# Patient Record
Sex: Female | Born: 1950 | Race: Black or African American | Hispanic: No | Smoking: Current every day smoker
Health system: Southern US, Community
[De-identification: ages and names within clinical notes are randomized; demographics above are authoritative.]

## PROBLEM LIST (undated history)

## (undated) DIAGNOSIS — E785 Hyperlipidemia, unspecified: Secondary | ICD-10-CM

## (undated) DIAGNOSIS — I2510 Atherosclerotic heart disease of native coronary artery without angina pectoris: Secondary | ICD-10-CM

## (undated) DIAGNOSIS — K760 Fatty (change of) liver, not elsewhere classified: Secondary | ICD-10-CM

## (undated) DIAGNOSIS — I10 Essential (primary) hypertension: Secondary | ICD-10-CM

## (undated) DIAGNOSIS — D126 Benign neoplasm of colon, unspecified: Secondary | ICD-10-CM

## (undated) DIAGNOSIS — I878 Other specified disorders of veins: Secondary | ICD-10-CM

## (undated) DIAGNOSIS — I739 Peripheral vascular disease, unspecified: Secondary | ICD-10-CM

## (undated) DIAGNOSIS — R809 Proteinuria, unspecified: Secondary | ICD-10-CM

## (undated) DIAGNOSIS — E669 Obesity, unspecified: Secondary | ICD-10-CM

## (undated) DIAGNOSIS — J45909 Unspecified asthma, uncomplicated: Secondary | ICD-10-CM

## (undated) DIAGNOSIS — E119 Type 2 diabetes mellitus without complications: Secondary | ICD-10-CM

## (undated) DIAGNOSIS — K219 Gastro-esophageal reflux disease without esophagitis: Secondary | ICD-10-CM

## (undated) DIAGNOSIS — R1310 Dysphagia, unspecified: Secondary | ICD-10-CM

## (undated) HISTORY — DX: Dysphagia, unspecified: R13.10

## (undated) HISTORY — DX: Hyperlipidemia, unspecified: E78.5

## (undated) HISTORY — DX: Proteinuria, unspecified: R80.9

## (undated) HISTORY — PX: CYSTECTOMY: SUR359

## (undated) HISTORY — DX: Type 2 diabetes mellitus without complications: E11.9

## (undated) HISTORY — DX: Essential (primary) hypertension: I10

## (undated) HISTORY — DX: Atherosclerotic heart disease of native coronary artery without angina pectoris: I25.10

## (undated) HISTORY — DX: Benign neoplasm of colon, unspecified: D12.6

## (undated) HISTORY — DX: Obesity, unspecified: E66.9

## (undated) HISTORY — PX: CORONARY STENT PLACEMENT: SHX1402

## (undated) HISTORY — DX: Gastro-esophageal reflux disease without esophagitis: K21.9

## (undated) HISTORY — DX: Peripheral vascular disease, unspecified: I73.9

## (undated) HISTORY — PX: TUBAL LIGATION: SHX77

## (undated) HISTORY — DX: Other specified disorders of veins: I87.8

## (undated) HISTORY — DX: Fatty (change of) liver, not elsewhere classified: K76.0

## (undated) HISTORY — PX: CARDIAC CATHETERIZATION: SHX172

## (undated) HISTORY — PX: PILONIDAL CYST / SINUS EXCISION: SUR543

---

## 1998-02-06 DIAGNOSIS — E119 Type 2 diabetes mellitus without complications: Secondary | ICD-10-CM

## 1998-02-06 HISTORY — DX: Type 2 diabetes mellitus without complications: E11.9

## 1999-09-22 ENCOUNTER — Other Ambulatory Visit: Admission: RE | Admit: 1999-09-22 | Discharge: 1999-09-22 | Payer: Self-pay | Admitting: Obstetrics

## 1999-09-22 ENCOUNTER — Encounter (INDEPENDENT_AMBULATORY_CARE_PROVIDER_SITE_OTHER): Payer: Self-pay

## 1999-09-27 ENCOUNTER — Ambulatory Visit (HOSPITAL_COMMUNITY): Admission: RE | Admit: 1999-09-27 | Discharge: 1999-09-27 | Payer: Self-pay | Admitting: Obstetrics

## 1999-09-27 ENCOUNTER — Encounter: Payer: Self-pay | Admitting: Obstetrics

## 2001-03-16 ENCOUNTER — Encounter: Payer: Self-pay | Admitting: Emergency Medicine

## 2001-03-16 ENCOUNTER — Emergency Department (HOSPITAL_COMMUNITY): Admission: EM | Admit: 2001-03-16 | Discharge: 2001-03-16 | Payer: Self-pay | Admitting: Emergency Medicine

## 2001-05-12 ENCOUNTER — Emergency Department (HOSPITAL_COMMUNITY): Admission: EM | Admit: 2001-05-12 | Discharge: 2001-05-12 | Payer: Self-pay | Admitting: *Deleted

## 2001-06-07 ENCOUNTER — Encounter: Payer: Self-pay | Admitting: Neurology

## 2001-06-07 ENCOUNTER — Ambulatory Visit (HOSPITAL_COMMUNITY): Admission: RE | Admit: 2001-06-07 | Discharge: 2001-06-07 | Payer: Self-pay | Admitting: Neurology

## 2001-11-20 ENCOUNTER — Ambulatory Visit (HOSPITAL_COMMUNITY): Admission: RE | Admit: 2001-11-20 | Discharge: 2001-11-20 | Payer: Self-pay | Admitting: Obstetrics

## 2001-11-20 ENCOUNTER — Encounter: Payer: Self-pay | Admitting: Obstetrics

## 2002-03-12 ENCOUNTER — Ambulatory Visit (HOSPITAL_COMMUNITY): Admission: RE | Admit: 2002-03-12 | Discharge: 2002-03-12 | Payer: Self-pay | Admitting: Obstetrics

## 2002-03-12 ENCOUNTER — Encounter: Payer: Self-pay | Admitting: Obstetrics

## 2003-03-30 ENCOUNTER — Ambulatory Visit (HOSPITAL_COMMUNITY): Admission: RE | Admit: 2003-03-30 | Discharge: 2003-03-30 | Payer: Self-pay | Admitting: Obstetrics

## 2004-03-29 ENCOUNTER — Ambulatory Visit (HOSPITAL_COMMUNITY): Admission: RE | Admit: 2004-03-29 | Discharge: 2004-03-29 | Payer: Self-pay | Admitting: Family Medicine

## 2005-08-29 ENCOUNTER — Emergency Department (HOSPITAL_COMMUNITY): Admission: EM | Admit: 2005-08-29 | Discharge: 2005-08-29 | Payer: Self-pay | Admitting: Emergency Medicine

## 2006-05-21 ENCOUNTER — Other Ambulatory Visit: Payer: Self-pay

## 2006-05-21 ENCOUNTER — Observation Stay: Payer: Self-pay | Admitting: Internal Medicine

## 2007-06-12 ENCOUNTER — Ambulatory Visit (HOSPITAL_COMMUNITY): Admission: RE | Admit: 2007-06-12 | Discharge: 2007-06-12 | Payer: Self-pay | Admitting: Family Medicine

## 2007-06-25 ENCOUNTER — Ambulatory Visit: Payer: Self-pay | Admitting: Internal Medicine

## 2007-07-08 ENCOUNTER — Ambulatory Visit: Payer: Self-pay | Admitting: Internal Medicine

## 2007-07-08 ENCOUNTER — Ambulatory Visit (HOSPITAL_COMMUNITY): Admission: RE | Admit: 2007-07-08 | Discharge: 2007-07-08 | Payer: Self-pay | Admitting: Internal Medicine

## 2007-07-08 ENCOUNTER — Encounter: Payer: Self-pay | Admitting: Internal Medicine

## 2007-07-08 DIAGNOSIS — D126 Benign neoplasm of colon, unspecified: Secondary | ICD-10-CM

## 2007-07-08 HISTORY — DX: Benign neoplasm of colon, unspecified: D12.6

## 2007-07-08 HISTORY — PX: COLONOSCOPY W/ POLYPECTOMY: SHX1380

## 2008-03-17 ENCOUNTER — Ambulatory Visit: Payer: Self-pay | Admitting: Cardiology

## 2008-03-18 ENCOUNTER — Other Ambulatory Visit: Payer: Self-pay | Admitting: Cardiology

## 2008-03-18 ENCOUNTER — Inpatient Hospital Stay (HOSPITAL_COMMUNITY): Admission: AD | Admit: 2008-03-18 | Discharge: 2008-03-19 | Payer: Self-pay | Admitting: Family Medicine

## 2008-03-18 ENCOUNTER — Ambulatory Visit: Payer: Self-pay | Admitting: Cardiovascular Disease

## 2008-03-19 ENCOUNTER — Other Ambulatory Visit: Payer: Self-pay | Admitting: Cardiology

## 2008-03-19 ENCOUNTER — Other Ambulatory Visit: Payer: Self-pay | Admitting: Cardiovascular Disease

## 2008-03-25 ENCOUNTER — Ambulatory Visit: Payer: Self-pay | Admitting: Cardiology

## 2008-03-30 ENCOUNTER — Encounter (HOSPITAL_COMMUNITY): Admission: RE | Admit: 2008-03-30 | Discharge: 2008-04-29 | Payer: Self-pay | Admitting: Cardiology

## 2008-04-07 ENCOUNTER — Ambulatory Visit: Payer: Self-pay | Admitting: Cardiology

## 2008-05-01 ENCOUNTER — Encounter (HOSPITAL_COMMUNITY): Admission: RE | Admit: 2008-05-01 | Discharge: 2008-05-31 | Payer: Self-pay | Admitting: Cardiology

## 2008-06-03 ENCOUNTER — Encounter (HOSPITAL_COMMUNITY): Admission: RE | Admit: 2008-06-03 | Discharge: 2008-07-03 | Payer: Self-pay | Admitting: Cardiology

## 2008-07-22 ENCOUNTER — Ambulatory Visit: Payer: Self-pay | Admitting: Physician Assistant

## 2008-07-26 ENCOUNTER — Emergency Department (HOSPITAL_COMMUNITY): Admission: EM | Admit: 2008-07-26 | Discharge: 2008-07-27 | Payer: Self-pay | Admitting: Emergency Medicine

## 2008-08-13 ENCOUNTER — Ambulatory Visit: Payer: Self-pay | Admitting: Internal Medicine

## 2008-08-13 DIAGNOSIS — R059 Cough, unspecified: Secondary | ICD-10-CM | POA: Insufficient documentation

## 2008-08-13 DIAGNOSIS — R05 Cough: Secondary | ICD-10-CM

## 2008-08-28 ENCOUNTER — Ambulatory Visit: Payer: Self-pay | Admitting: Internal Medicine

## 2008-09-24 ENCOUNTER — Ambulatory Visit: Payer: Self-pay | Admitting: Internal Medicine

## 2008-09-24 ENCOUNTER — Ambulatory Visit: Payer: Self-pay | Admitting: Cardiology

## 2008-10-04 ENCOUNTER — Emergency Department (HOSPITAL_COMMUNITY): Admission: EM | Admit: 2008-10-04 | Discharge: 2008-10-04 | Payer: Self-pay | Admitting: Emergency Medicine

## 2008-12-26 ENCOUNTER — Encounter (INDEPENDENT_AMBULATORY_CARE_PROVIDER_SITE_OTHER): Payer: Self-pay | Admitting: *Deleted

## 2008-12-26 LAB — CONVERTED CEMR LAB
BUN: 7 mg/dL
CO2: 24 meq/L
Calcium: 8.7 mg/dL
Chloride: 103 meq/L
Creatinine, Ser: 0.75 mg/dL
Glucose, Bld: 148 mg/dL
HDL: 24 mg/dL
Hgb A1c MFr Bld: 10.5 %

## 2009-03-04 ENCOUNTER — Encounter (INDEPENDENT_AMBULATORY_CARE_PROVIDER_SITE_OTHER): Payer: Self-pay | Admitting: *Deleted

## 2009-03-11 ENCOUNTER — Encounter (INDEPENDENT_AMBULATORY_CARE_PROVIDER_SITE_OTHER): Payer: Self-pay | Admitting: *Deleted

## 2009-03-11 ENCOUNTER — Ambulatory Visit: Payer: Self-pay | Admitting: Cardiology

## 2009-03-11 DIAGNOSIS — F17200 Nicotine dependence, unspecified, uncomplicated: Secondary | ICD-10-CM | POA: Insufficient documentation

## 2009-03-11 DIAGNOSIS — Z794 Long term (current) use of insulin: Secondary | ICD-10-CM

## 2009-03-11 DIAGNOSIS — K219 Gastro-esophageal reflux disease without esophagitis: Secondary | ICD-10-CM | POA: Insufficient documentation

## 2009-03-11 DIAGNOSIS — I2510 Atherosclerotic heart disease of native coronary artery without angina pectoris: Secondary | ICD-10-CM | POA: Insufficient documentation

## 2009-03-11 DIAGNOSIS — E782 Mixed hyperlipidemia: Secondary | ICD-10-CM | POA: Insufficient documentation

## 2009-03-11 DIAGNOSIS — E1142 Type 2 diabetes mellitus with diabetic polyneuropathy: Secondary | ICD-10-CM | POA: Insufficient documentation

## 2009-08-07 ENCOUNTER — Encounter (INDEPENDENT_AMBULATORY_CARE_PROVIDER_SITE_OTHER): Payer: Self-pay | Admitting: *Deleted

## 2009-08-07 LAB — CONVERTED CEMR LAB
Albumin: 4.2 g/dL
HDL: 23 mg/dL
Hgb A1c MFr Bld: 8.9 %
Total Protein: 7.6 g/dL
Triglycerides: 149 mg/dL

## 2009-10-12 ENCOUNTER — Ambulatory Visit: Payer: Self-pay | Admitting: Cardiovascular Disease

## 2009-10-12 ENCOUNTER — Observation Stay (HOSPITAL_COMMUNITY): Admission: EM | Admit: 2009-10-12 | Discharge: 2009-10-12 | Payer: Self-pay | Admitting: Emergency Medicine

## 2009-10-12 LAB — CONVERTED CEMR LAB
Albumin: 3.7 g/dL
Alkaline Phosphatase: 151 units/L
CO2: 21 meq/L
Chloride: 105 meq/L
HCT: 41.3 %
Platelets: 297 10*3/uL
Sodium: 136 meq/L
Total Protein: 7.7 g/dL
WBC: 8.4 10*3/uL

## 2009-10-29 ENCOUNTER — Encounter (INDEPENDENT_AMBULATORY_CARE_PROVIDER_SITE_OTHER): Payer: Self-pay | Admitting: *Deleted

## 2009-11-03 ENCOUNTER — Ambulatory Visit: Payer: Self-pay | Admitting: Cardiology

## 2009-11-03 ENCOUNTER — Encounter (INDEPENDENT_AMBULATORY_CARE_PROVIDER_SITE_OTHER): Payer: Self-pay | Admitting: *Deleted

## 2010-03-08 NOTE — Miscellaneous (Signed)
Summary: lipids,lft  Clinical Lists Changes  Observations: Added new observation of ALBUMIN: 4.2 g/dL (16/11/9602 54:09) Added new observation of PROTEIN, TOT: 7.6 g/dL (81/19/1478 29:56) Added new observation of SGPT (ALT): 14 units/L (08/07/2009 16:44) Added new observation of SGOT (AST): 17 units/L (08/07/2009 16:44) Added new observation of ALK PHOS: 145 units/L (08/07/2009 16:44) Added new observation of BILI DIRECT: <0.1 mg/dL (21/30/8657 84:69) Added new observation of LDL: 102 mg/dL (62/95/2841 32:44) Added new observation of HDL: 23 mg/dL (02/08/7251 66:44) Added new observation of TRIGLYC TOT: 149 mg/dL (03/47/4259 56:38) Added new observation of CHOLESTEROL: 155 mg/dL (75/64/3329 51:88) Added new observation of HGBA1C: 8.9 % (08/07/2009 16:44)

## 2010-03-08 NOTE — Miscellaneous (Signed)
Summary: cath 10/13/2009,cxr 10/12/2009  Clinical Lists Changes  Observations: Added new observation of CARDCATHFIND:  IMPRESSION:   1. Patent stents in the right coronary artery.   2. Moderate left main stenosis that is nonobstructive.   3. Diffuse nonobstructive disease in the circumflex and left anterior       descending arteries.   4. Normal left ventricular systolic function.      RECOMMENDATIONS:  I recommend continued medical management at this time.   The patient will be discharged to home tonight after her mandatory   bedrest.               Verne Carrow, MD     (10/13/2009 15:24) Added new observation of CXR RESULTS:  Clinical Data: Chest pain    PORTABLE CHEST - 1 VIEW    Comparison: 07/26/2008.    Findings: The heart is borderline in size.  There are no   infiltrates or edematous changes.  There are mild stable chronic   bronchitic changes.  There is no evidence for adenopathy.    IMPRESSION:   Borderline cardiac size - stable.  Stable chronic bronchitic   changes.  No acute findings.    Read By:  Stoney Bang,  M.D.   Released By:  Stoney Bang,  M.D.  Additional Information  HL7 RESULT STATUS : F  External image : [id number]  External IF Update Timestamp : 2009-10-12:09:46:11.000000  (10/12/2009 15:24)      CXR  Procedure date:  10/12/2009  Findings:       Clinical Data: Chest pain    PORTABLE CHEST - 1 VIEW    Comparison: 07/26/2008.    Findings: The heart is borderline in size.  There are no   infiltrates or edematous changes.  There are mild stable chronic   bronchitic changes.  There is no evidence for adenopathy.    IMPRESSION:   Borderline cardiac size - stable.  Stable chronic bronchitic   changes.  No acute findings.    Read By:  Stoney Bang,  M.D.   Released By:  Stoney Bang,  M.D.  Additional Information  HL7 RESULT STATUS : F  External image : [id number]  External IF  Update Timestamp : 2009-10-12:09:46:11.000000   Cardiac Cath  Procedure date:  10/13/2009  Findings:       IMPRESSION:   1. Patent stents in the right coronary artery.   2. Moderate left main stenosis that is nonobstructive.   3. Diffuse nonobstructive disease in the circumflex and left anterior       descending arteries.   4. Normal left ventricular systolic function.      RECOMMENDATIONS:  I recommend continued medical management at this time.   The patient will be discharged to home tonight after her mandatory   bedrest.               Verne Carrow, MD

## 2010-03-08 NOTE — Letter (Signed)
Summary: [facility], agricultural at [employer]  [address], [place] [address]   Phone: [phone]  Fax: [phone]     November 03, 2009 MRN: [id number]   Rachel [name] [address] [place]  [address]      YOUR LAB WORK IS DUE   February 02, 2010  Please go to [facility], located across the street from [facility] on the second floor.  Hours are Monday - Friday 7am until 7:30pm         Saturday 8am until 12noon    _X_  DO NOT EAT OR DRINK AFTER MIDNIGHT EVENING PRIOR TO LABWORK  __ YOUR LABWORK IS NOT FASTING --YOU MAY EAT PRIOR TO LABWORK

## 2010-03-08 NOTE — Assessment & Plan Note (Signed)
Summary: ROV  Medications Added LANTUS 100 UNIT/ML SOLN (INSULIN GLARGINE) 54 units at bedtime NIACIN CR 500 MG CR-TABS (NIACIN) Take 1 tablet by mouth once a day titrating to 1500mg  once daily AMLODIPINE BESYLATE 5 MG TABS (AMLODIPINE BESYLATE) Take one tablet by mouth daily      Allergies Added: NKDA  Visit Type:  Follow-up Referring Provider:  Dr. Lubertha South Primary Provider:  Dr. Lubertha South   History of Present Illness: Return visit for this very pleasant 60 year old woman with coronary artery disease and multiple cardiovascular risk factors.  Since her last visit, she has done generally well.  She has had no significant chest discomfort.  She does note dyspnea with moderate exertion.  She was unable to complete the cardiac rehabilitation program due to the need to return to her work place.  Current Medications (verified): 1)  Aspirin 81 Mg Tbec (Aspirin) .... Take One Tablet By Mouth Daily 2)  Metformin Hcl 1000 Mg Tabs (Metformin Hcl) .Marland Kitchen.. 1 Tab Two Times A Day 3)  Glyburide 5 Mg Tabs (Glyburide) .... 2 Tabs Two Times A Day 4)  Plavix 75 Mg Tabs (Clopidogrel Bisulfate) .Marland Kitchen.. 1 Tab Daily 5)  Oxybutynin Chloride 10 Mg Xr24h-Tab (Oxybutynin Chloride) .Marland Kitchen.. 1 Tab Two Times A Day 6)  Lipitor 80 Mg Tabs (Atorvastatin Calcium) .Marland Kitchen.. 1 Tab Daily 7)  Lantus 100 Unit/ml Soln (Insulin Glargine) .... 54 Units At Bedtime 8)  Alprazolam 0.5 Mg Tabs (Alprazolam) .Marland Kitchen.. 1 Tab At Bedtime As Needed 9)  Niacin Cr 500 Mg Cr-Tabs (Niacin) .... Take 1 Tablet By Mouth Once A Day Titrating To 1500mg  Once Daily 10)  Amlodipine Besylate 5 Mg Tabs (Amlodipine Besylate) .... Take One Tablet By Mouth Daily  Allergies (verified): No Known Drug Allergies  Past History:  PMH, FH, and Social History reviewed and updated.  Past Medical History: ASCVD-PCI with DES to the RCA in 03/2008; residual moderate left main, circumflex and first marginal disease;         normal EF Diabetes Type 2-onset in  2000 Hyperlipidemia Hypertension G E R D    h/o esophageal dilatation 6/09 Cough..................................................................Marland KitchenWert    - onset 6/09    - Sinus CT neg September 24, 2008   Social History: Separated with one adult daughter Clinical biochemist for [employer] Current smoker since age 8.  Smokes 1 ppd; 40-pack-year history  Review of Systems       The patient complains of weight loss, dyspnea on exertion, and peripheral edema.  The patient denies anorexia, vision loss, decreased hearing, hoarseness, chest pain, syncope, headaches, hemoptysis, abdominal pain, and melena.         She previously noted a prolonged cough, but this resolved after evaluation and treatment by Dr. Sherene Sires  Vital Signs:  Patient profile:   60 year old female Weight:      176 pounds Pulse rate:   95 / minute BP sitting:   155 / 86  (right arm)  Vitals Entered By: Dreama Saa, CNA (March 11, 2009 2:46 PM)  Physical Exam  General:   General-Well developed; no acute distress; overweight Neck-No JVD; no carotid bruits: Lungs-No tachypnea, no rales; no rhonchi; no wheezes: Cardiovascular-normal PMI; normal S1 and S2: Abdomen-BS normal; soft and non-tender without masses or organomegaly:  Musculoskeletal-No deformities, no cyanosis or clubbing: Neurologic-Normal cranial nerves; symmetric strength and tone:  Skin-Warm, no significant lesions: Extremities-Nl distal pulses; no edema:     Impression & Recommendations:  Problem # 1:  ATHEROSCLEROTIC CARDIOVASCULAR  DISEASE (ICD-429.2) No clinical evidence for recurrent myocardial ischemia.  Treatment will continue to concentrate on optimal control of cardiovascular risk factors.  Problem # 2:  HYPERTENSION (ICD-401.1) Patient reports systolic blood pressure in the 140s at home.  This remains too high for patient with diabetes and known vascular disease.  Amlodipine 5 mg q.d. will be added to her medical regime.  She will  follow blood pressures at home, and we will continue to follow them in the office.  Problem # 3:  HYPERLIPIDEMIA (ICD-272.4) Hyperlipidemia is relatively well controlled on high-dose atorvastatin, but LDL remains higher than the optimal level, and HDL is low.  Niacin will be added an initial dose of 500 mg q.d. and titrated to 1500 mg q.d. at which time a fasting lipid profile will be repeated.  Problem # 4:  DIABETES MELLITUS, TYPE II (ICD-250.00) Recent hemoglobin A1c level was reportedly 9.  Patient will continue work closely with Dr. Gerda Diss to achieve better control.  Problem # 5:  COUGH (ICD-786.2) Patient reports that Dr. Sherene Sires stopped all of her antihypertensive medications in an attempt to control her cough.  It does not appear that she was taking ACE inhibitor at that time, but was treated with an ARB.  I will try to avoid medications from both classes.  I will plan to see this nice woman again in 6 months.  Other Orders: Future Orders: T-Lipid Profile ([id number]) ... 05/10/2009 T-Comprehensive Metabolic Panel [phone]) ... 05/10/2009  Patient Instructions: 1)  Your physician recommends that you schedule a follow-up appointment in: 6 months 2)  Your physician recommends that you return for lab work in: 2 months 3)  Your physician has recommended you make the following change in your medication: amlodipine 5mg  daily 4)  niacin 500mg  daily titrating to 1500mg  daily Prescriptions: AMLODIPINE BESYLATE 5 MG TABS (AMLODIPINE BESYLATE) Take one tablet by mouth daily  #30 x 6   Entered by:   Teressa Lower RN   Authorized by:   Kathlen Brunswick, MD, [facility]   Signed by:   Teressa Lower RN on 03/11/2009   Method used:   Electronically to        [phone][facility] (retail)       [address]       [place], [place]  [address]       Ph: [phone] or [phone]       Fax: [phone]   RxID:   [id number]

## 2010-03-08 NOTE — Miscellaneous (Signed)
Summary: labs bmp,liver,lipid ,a1c, 12/26/2008  Clinical Lists Changes  Observations: Added new observation of CALCIUM: 8.7 mg/dL (34/74/2595 63:87) Added new observation of ALBUMIN: 4.1 g/dL (56/43/3295 18:84) Added new observation of PROTEIN, TOT: 7.7 g/dL (16/60/6301 60:10) Added new observation of SGPT (ALT): 12 units/L (12/26/2008 15:41) Added new observation of SGOT (AST): 16 units/L (12/26/2008 15:41) Added new observation of ALK PHOS: 140 units/L (12/26/2008 15:41) Added new observation of BILI DIRECT: 0.1 mg/dL (93/23/5573 22:02) Added new observation of CREATININE: 0.75 mg/dL (54/27/0623 76:28) Added new observation of BUN: 7 mg/dL (31/51/7616 07:37) Added new observation of BG RANDOM: 148 mg/dL (10/62/6948 54:62) Added new observation of CO2 PLSM/SER: 24 meq/L (12/26/2008 15:41) Added new observation of CL SERUM: 103 meq/L (12/26/2008 15:41) Added new observation of K SERUM: 4.7 meq/L (12/26/2008 15:41) Added new observation of NA: 139 meq/L (12/26/2008 15:41) Added new observation of LDL: 101 mg/dL (70/35/0093 81:82) Added new observation of HDL: 24 mg/dL (99/37/1696 78:93) Added new observation of TRIGLYC TOT: 141 mg/dL (81/02/7508 25:85) Added new observation of CHOLESTEROL: 153 mg/dL (27/78/2423 53:61) Added new observation of HGBA1C: 10.5 % (12/26/2008 15:41)

## 2010-03-08 NOTE — Letter (Signed)
Summary: [facility], agricultural at [employer]  [address] [place] [address]   Phone: [phone]  Fax: [phone]     March 11, 2009 MRN: [id number]   Rachel [name] [address], [place]  [address]      YOUR LAB WORK IS DUE   _____________APRIL 4, 2011___________________________  Please go to [facility], located across the street from [facility] [place] [facility] on the second floor.  Hours are Monday - Friday 7am until 7:30pm         Saturday 8am until 12noon    _X_  DO NOT EAT OR DRINK AFTER MIDNIGHT EVENING PRIOR TO LABWORK  __ YOUR LABWORK IS NOT FASTING --YOU MAY EAT PRIOR TO LABWORK

## 2010-03-08 NOTE — Assessment & Plan Note (Signed)
Summary: PER PT REQUEST DUE FOR F/U/TG  Medications Added LANTUS 100 UNIT/ML SOLN (INSULIN GLARGINE) 74 units at bedtime NIACIN CR 500 MG CR-TABS (NIACIN) Take 2 tabs at bedtime AMLODIPINE BESYLATE 5 MG TABS (AMLODIPINE BESYLATE) Take one tablet by mouth daily NITROSTAT 0.4 MG SUBL (NITROGLYCERIN) 1 tablet under tongue at onset of chest pain; you may repeat every 5 minutes for up to 3 doses.      Allergies Added: NKDA  Visit Type:  Follow-up Referring Provider:  . Primary Provider:  Dr. Lubertha South   History of Present Illness: Rachel [name] returns to the office for continued assessment and treatment of coronary disease and cardiovascular risk factors.  She was recently admitted to hospital with chest discomfort and underwent cardiac catheterization revealing patent stent sites and no significant new atherosclerotic disease.  Blood pressure control was improved with amlodipine prescribed for her at her last visit, but she became concerned about systolic blood pressures of less than 120, contacted Dr. Gerda Diss and was apparently told to discontinue that medication.  Since then, blood pressures systolics have been in the 130-145 range.  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  Lifestyle is sedentary.  She is doing a program at work that will promote exercise, a healthy diet and weight reduction.  She wonders whether she would be a candidate for bariatric surgery.  Current Medications (verified): 1)  Aspirin 81 Mg Tbec (Aspirin) .... Take One Tablet By Mouth Daily 2)  Metformin Hcl 1000 Mg Tabs (Metformin Hcl) .Marland Kitchen.. 1 Tab Two Times A Day 3)  Glyburide 5 Mg Tabs (Glyburide) .... 2 Tabs Two Times A Day 4)  Plavix 75 Mg Tabs (Clopidogrel Bisulfate) .Marland Kitchen.. 1 Tab Daily 5)  Oxybutynin Chloride 10 Mg Xr24h-Tab (Oxybutynin Chloride) .Marland Kitchen.. 1 Tab Two Times A Day 6)  Lipitor 80 Mg Tabs (Atorvastatin Calcium) .Marland Kitchen.. 1 Tab Daily 7)  Lantus 100 Unit/ml Soln  (Insulin Glargine) .... 74 Units At Bedtime 8)  Niacin Cr 500 Mg Cr-Tabs (Niacin) .... Take 2 Tabs At Bedtime 9)  Amlodipine Besylate 5 Mg Tabs (Amlodipine Besylate) .... Take One Tablet By Mouth Daily 10)  Nitrostat 0.4 Mg Subl (Nitroglycerin) .Marland Kitchen.. 1 Tablet Under Tongue At Onset of Chest Pain; You May Repeat Every 5 Minutes For Up To 3 Doses.  Allergies (verified): No Known Drug Allergies  Comments:  Nurse/Medical Assistant: no meds no list reviewed previous ov and meds with patient Dr.Steve Gerda Diss took patient off of amlodipine due to bp being  low.  Past History:  PMH, FH, and Social History reviewed and updated.  Past Medical History: ASCVD-PCI with DES to the RCA in 03/2008; residual moderate left main, circumflex and first marginal disease;         normal EF; 10/2009 40% left main; diffuse nonobstructive dx in Cx and LAD; 50% in-stent restenosis in the RCA Diabetes Type 2-onset in 2000 Hyperlipidemia Hypertension Gastroesophageal reflux disease: h/o esophageal dilatation 6/09 Cough..................................................................Marland KitchenWert    - onset 6/09    - Sinus CT neg September 24, 2008   Review of Systems       See history of present illness.  Vital Signs:  Patient profile:   60 year old female Weight:      183 pounds BMI:     32.53 Pulse rate:   81 / minute BP sitting:   135 / 82  (right arm)  Vitals Entered By: Dreama Saa, CNA (November 03, 2009 3:56 PM)  Physical Exam  General:  Obese; well developed; no acute distress; Neck-No JVD; no carotid bruits: Lungs-No tachypnea, no rales; no rhonchi; no wheezes: Cardiovascular-normal PMI; normal S1 and S2: Abdomen-BS normal; soft and non-tender without masses or organomegaly:  Musculoskeletal-No deformities, no cyanosis or clubbing: Neurologic-Normal cranial nerves; symmetric strength and tone:  Skin-Warm, no significant lesions: Extremities-Nl distal pulses; no edema:     Impression &  Recommendations:  Problem # 1:  ATHEROSCLEROTIC CARDIOVASCULAR DISEASE (ICD-429.2) Coronary angiography reveals no progression of disease since intervention more than one year ago.  We will continue to optimally manage risk factors.  Amlodipine will be restored to her medical regime for better blood pressure control and for treatment of possible myocardial ischemia.  She was also given a prescription for sublingual nitroglycerin p.r.n.  Problem # 2:  HYPERTENSION (ICD-401.1) Blood pressure is relatively low, but not optimal for a diabetic, especially one with coronary disease.  Systolics of 110-120 would be superb, and values even lower, if well tolerated by the patient, would be fine.  Problem # 3:  HYPERLIPIDEMIA (ICD-272.4) Recent lipid determination at work-total cholesterol was 142 with an HDL level of 22.  LDL was not assessed.  These are relatively good results.  Increased exercise was suggested to raise the HDL, if possible.  Improved control of diabetes would be useful as well.  Problem # 4:  DIABETES MELLITUS, TYPE II (ICD-250.00) Recent hemoglobin A1c level was 8.4. Dr. Gerda Diss is addressing this with an increase in patient's medication.  Problem # 5:  TOBACCO ABUSE (ICD-305.1) Patient has been attempting to discontinue tobacco use.  The health program in which she is participating at work will assist her with this issue as well.  I will plan to reassess this nice woman in 6 months.  Other Orders: Future Orders: T-Lipid Profile ([id number]) ... 02/02/2010  CXR  Procedure date:  10/12/2009  Findings:      Borderline heart size Clear lung fields with chronic bronchitic changes.  -  Date:  10/12/2009    Sodium: 136     Potassium: 3.8    Chloride: 105    CO2 Total: 21    SGOT (AST): 24    SGPT (ALT): 19    T. Bilirubin: 0.5    Alk Phos: 151    Calcium: 9.6    Total Protein: 7.7    Albumin: 3.7    WBC: 8.4    HGB: 14.1    HCT: 41.3    PLT: 297    MCV:  88   Patient Instructions: 1)  Your physician recommends that you schedule a follow-up appointment in:6 months 2)  Your physician recommends that you return for lab work in:3 months 3)  Your physician has recommended you make the following change in your medication: nitroglycerin 0.4mg  under tongue every 5 minutes x3 then go to ED, amlodipine 5 mg daily Prescriptions: NITROSTAT 0.4 MG SUBL (NITROGLYCERIN) 1 tablet under tongue at onset of chest pain; you may repeat every 5 minutes for up to 3 doses.  #25 x 3   Entered by:   Teressa Lower RN   Authorized by:   Kathlen Brunswick, MD, [facility]   Signed by:   Teressa Lower RN on 11/03/2009   Method used:   Electronically to        [facility] [phone]* (retail)       [address]       [place], [place]  [address]       Ph: [phone] or [phone]       Fax: [phone]   RxID:   [id number] AMLODIPINE BESYLATE 5 MG TABS (AMLODIPINE BESYLATE) Take one tablet by mouth daily  #30 x 3   Entered by:   Teressa Lower RN   Authorized by:   Kathlen Brunswick, MD, [facility]   Signed by:   Teressa Lower RN on 11/03/2009   Method used:   Electronically to        [facility] [phone]* (retail)       [address]       [place], [place]  [address]       Ph: [phone] or [phone]       Fax: [phone]   RxID:   [id number]

## 2010-03-29 ENCOUNTER — Encounter: Payer: Self-pay | Admitting: Urgent Care

## 2010-03-29 ENCOUNTER — Ambulatory Visit (INDEPENDENT_AMBULATORY_CARE_PROVIDER_SITE_OTHER): Payer: 59 | Admitting: Urgent Care

## 2010-03-29 DIAGNOSIS — R748 Abnormal levels of other serum enzymes: Secondary | ICD-10-CM | POA: Insufficient documentation

## 2010-03-29 DIAGNOSIS — R112 Nausea with vomiting, unspecified: Secondary | ICD-10-CM | POA: Insufficient documentation

## 2010-03-29 DIAGNOSIS — R1013 Epigastric pain: Secondary | ICD-10-CM | POA: Insufficient documentation

## 2010-03-29 DIAGNOSIS — D126 Benign neoplasm of colon, unspecified: Secondary | ICD-10-CM | POA: Insufficient documentation

## 2010-04-01 ENCOUNTER — Encounter: Payer: Self-pay | Admitting: Urgent Care

## 2010-04-04 ENCOUNTER — Other Ambulatory Visit: Payer: Self-pay | Admitting: Internal Medicine

## 2010-04-04 ENCOUNTER — Encounter: Payer: Self-pay | Admitting: Internal Medicine

## 2010-04-04 ENCOUNTER — Telehealth (INDEPENDENT_AMBULATORY_CARE_PROVIDER_SITE_OTHER): Payer: Self-pay | Admitting: *Deleted

## 2010-04-04 DIAGNOSIS — K760 Fatty (change of) liver, not elsewhere classified: Secondary | ICD-10-CM

## 2010-04-04 DIAGNOSIS — R1011 Right upper quadrant pain: Secondary | ICD-10-CM

## 2010-04-05 NOTE — Assessment & Plan Note (Signed)
Summary: PERSISTENT GASTRITIS   Vital Signs:  Patient profile:   60 year old female Height:      63.5 inches Weight:      180 pounds BMI:     31.50 Temp:     98 degrees F oral Pulse rate:   64 / minute BP sitting:   142 / 96  (left arm)  Vitals Entered By: Carolan Clines LPN (March 29, 2010 1:43 PM)  Visit Type:  Follow-up Visit Primary Care Provider:  Dr Simone Curia  Chief Complaint:  epigastric pain.  History of Present Illness: 60 y/o black female w/ "stomach discomfort" x 2 yrs ago after heart cath.  c/o epigastric pain/RUQ pain 7/10, radiates around to side.  Painful to touch.  Occ sharp pain.  Always aching.   Not on PPI.  Not affected by eating.  Has had intermittant pain, now pain almost all day every day, worse at night.  Did have some nausea w/ this, worse w/ niacin.  No recent vomiting.  Tried carafate--no help.  BM w/ occasional urgency.  Denies constipation or diarrhea.  Denies rectal bleeding.  On plavix & ASA 81mg  4 per day.      Labs 03/10/10-> ALP 148 glucose 176, otherwise normal T chol 205   Current Medications (verified): 1)  Aspirin 81 Mg Tbec (Aspirin) .... Take One Tablet By Mouth Daily 2)  Metformin Hcl 1000 Mg Tabs (Metformin Hcl) .Marland Kitchen.. 1 Tab Two Times A Day 3)  Glyburide 5 Mg Tabs (Glyburide) .... 2 Tabs Two Times A Day 4)  Plavix 75 Mg Tabs (Clopidogrel Bisulfate) .Marland Kitchen.. 1 Tab Daily 5)  Oxybutynin Chloride 10 Mg Xr24h-Tab (Oxybutynin Chloride) .Marland Kitchen.. 1 Tab Two Times A Day 6)  Lipitor 80 Mg Tabs (Atorvastatin Calcium) .Marland Kitchen.. 1 Tab Daily 7)  Lantus 100 Unit/ml Soln (Insulin Glargine) .... 84 Units At Bedtime 8)  Niacin Cr 500 Mg Cr-Tabs (Niacin) .... Take 2 Tabs At Bedtime 9)  Amlodipine Besylate 5 Mg Tabs (Amlodipine Besylate) .... Take One Tablet By Mouth Daily 10)  Nitrostat 0.4 Mg Subl (Nitroglycerin) .Marland Kitchen.. 1 Tablet Under Tongue At Onset of Chest Pain; You May Repeat Every 5 Minutes For Up To 3 Doses.  Allergies (verified): No Known Drug Allergies  Past  History:  Past Medical History: TA removed & multiple polyps ablated on colonoscopy 07/2007 EGD w/ ED, sm hiatal hernia friable pyloric channel 07/2007 ASCVD-PCI with DES to the RCA in 03/2008; residual moderate left main, circumflex and first marginal disease;         normal EF; 10/2009 40% left main; diffuse nonobstructive dx in Cx and LAD; 50% in-stent restenosis in the RCA Diabetes Type 2-onset in 2000 Hyperlipidemia Hypertension Gastroesophageal reflux disease: h/o esophageal dilatation 6/09 Cough..................................................................Marland KitchenWert    - onset 6/09    - Sinus CT neg September 24, 2008  fatty liver  Past Surgical History: Reviewed history from 08/13/2008 and no changes required. s/p bilat. tubal ligation s/p pilonidal cystectomy s/p colon polypectomy  Angioplasty 2/10  Family History: Heart dz- MGM Bone CA- Father No known family history of colorectal carcinoma, IBD, liver or chronic GI problems.  Social History: Separated with one adult daughter Holiday representative w/ [employer] Current smoker since age 67.  Smokes 1 ppd; 43-pack-year history Alcohol Use - no Illicit Drug Use - no Drug Use:  no  Review of Systems General:  See HPI; Denies fever, chills, sweats, anorexia, fatigue, weakness, malaise, weight loss, and sleep disorder. CV:  Denies chest pains, angina, palpitations,  syncope, dyspnea on exertion, orthopnea, PND, peripheral edema, and claudication. Resp:  Denies dyspnea at rest, dyspnea with exercise, cough, sputum, wheezing, coughing up blood, and pleurisy. GI:  See HPI; Denies jaundice. GU:  Denies urinary burning, blood in urine, nocturnal urination, urinary frequency, urinary incontinence, and abnormal vaginal bleeding. MS:  Denies joint pain / LOM, joint swelling, joint stiffness, joint deformity, low back pain, muscle weakness, muscle cramps, muscle atrophy, leg pain at night, leg pain with exertion, and shoulder pain / LOM hand /  wrist pain (CTS). Derm:  Denies rash, itching, dry skin, hives, moles, warts, and unhealing ulcers. Psych:  Denies depression, anxiety, memory loss, suicidal ideation, hallucinations, paranoia, phobia, and confusion. Heme:  Denies bruising, bleeding, and enlarged lymph nodes.  Physical Exam  General:  Well developed, well nourished, no acute distress. Head:  Normocephalic and atraumatic. Eyes:  Sclera clear, no icterus. Ears:  Normal auditory acuity. Nose:  No deformity, discharge,  or lesions. Mouth:  No deformity or lesions, dentition normal. Neck:  Supple; no masses or thyromegaly. Lungs:  Clear throughout to auscultation. Heart:  Regular rate and rhythm; no murmurs, rubs,  or bruits. Abdomen:  Soft, nontender and nondistended. No masses, hepatosplenomegaly or hernias noted. Normal bowel sounds.without guarding and without rebound.   Msk:  Symmetrical with no gross deformities. Normal posture. Pulses:  Normal pulses noted. Extremities:  No clubbing, cyanosis, edema or deformities noted. Neurologic:  Alert and  oriented x4;  grossly normal neurologically. Skin:  Intact without significant lesions or rashes. Cervical Nodes:  No significant cervical adenopathy. Psych:  Alert and cooperative. Normal mood and affect.   Impression & Recommendations:  Problem # 1:  ABDOMINAL PAIN, EPIGASTRIC (ICD-789.06) 60 y/o black female w/ epigastric/RUQ pain with nausea that has been persistent since she started ASA, Niacin, & plavix.  Suspect gastritis +/- PUD.  She needs to be on chronic concommitant PPI.  No evidence of GI bleeding at this time.  Biliary etiology is possible as well.   Orders: Est. Patient Level IV (16109)  Problem # 2:  NAUSEA WITH VOMITING (ICD-787.01) See #1  Problem # 3:  ALKALINE PHOSPHATASE, ELEVATED (ICD-790.5) Non-specific.  ?cholestasis.  Hx fatty liver.  WIll check isoenzymes.  Obtain abd [place] given RUQ/epigastric pain.  Problem # 4:  ADENOMATOUS COLONIC POLYP  (ICD-211.3) Due for surveillance 06/2010  Other Orders: T-Alkaline Phosphatase, Fractionalized ([phone])  Patient Instructions: 1)  Begin Dexilant 60mg  daily (2 weeks samples given) 2)  Check ALP isoenzymes 3)  ABD [place]   Orders Added: 1)  Est. Patient Level IV [91478] 2)  T-Alkaline Phosphatase, Fractionalized [[id number]]

## 2010-04-05 NOTE — Letter (Signed)
Summary: REFERRAL FROM DR Lubertha South  REFERRAL FROM DR Lubertha South   Imported By: Rexene Alberts 04/01/2010 13:58:05  _____________________________________________________________________  External Attachment:    Type:   Image     Comment:   External Document

## 2010-04-07 ENCOUNTER — Encounter: Payer: Self-pay | Admitting: Urgent Care

## 2010-04-07 ENCOUNTER — Ambulatory Visit (HOSPITAL_COMMUNITY)
Admission: RE | Admit: 2010-04-07 | Discharge: 2010-04-07 | Disposition: A | Discharge status: Home / Self Care | Payer: 59 | Source: Ambulatory Visit | Attending: Internal Medicine | Admitting: Internal Medicine

## 2010-04-07 DIAGNOSIS — R1011 Right upper quadrant pain: Secondary | ICD-10-CM | POA: Insufficient documentation

## 2010-04-07 DIAGNOSIS — K760 Fatty (change of) liver, not elsewhere classified: Secondary | ICD-10-CM

## 2010-04-07 HISTORY — PX: ESOPHAGOGASTRODUODENOSCOPY: SHX1529

## 2010-04-12 ENCOUNTER — Encounter: Payer: Self-pay | Admitting: Internal Medicine

## 2010-04-14 NOTE — Progress Notes (Addendum)
Summary: U/S Request  ---- Converted from flag ---- ---- 04/01/2010 9:30 AM, Hendricks Limes LPN wrote:   ---- 03/29/2010 3:13 PM, Joselyn Arrow FNP-BC wrote: Please sched abd [place] re: elevated ALP & ruq pain, hx fatty liver & let pt know, we need labs drawn & fax lab order Thanks ------------------------------  Appended Document: U/S Request pt called this morning. she just finished her labs and ultrasound. pt is requesting to have procedure done ASAP due to family issues and her having to take care of her grandson. she was told results would be faxed to [place] this afternoon and we can reach her at work [phone] or her cell [phone]

## 2010-04-14 NOTE — Letter (Signed)
Summary: ABD U/S ORDER  ABD U/S ORDER   Imported By: Ave Filter 04/04/2010 10:05:27  _____________________________________________________________________  External Attachment:    Type:   Image     Comment:   External Document

## 2010-04-15 ENCOUNTER — Ambulatory Visit (HOSPITAL_COMMUNITY)
Admission: RE | Admit: 2010-04-15 | Discharge: 2010-04-15 | Disposition: A | Discharge status: Home / Self Care | Payer: 59 | Source: Ambulatory Visit | Attending: Internal Medicine | Admitting: Internal Medicine

## 2010-04-15 ENCOUNTER — Encounter: Payer: 59 | Admitting: Internal Medicine

## 2010-04-15 DIAGNOSIS — Z79899 Other long term (current) drug therapy: Secondary | ICD-10-CM | POA: Insufficient documentation

## 2010-04-15 DIAGNOSIS — R112 Nausea with vomiting, unspecified: Secondary | ICD-10-CM | POA: Insufficient documentation

## 2010-04-15 DIAGNOSIS — Z794 Long term (current) use of insulin: Secondary | ICD-10-CM | POA: Insufficient documentation

## 2010-04-15 DIAGNOSIS — E119 Type 2 diabetes mellitus without complications: Secondary | ICD-10-CM | POA: Insufficient documentation

## 2010-04-15 DIAGNOSIS — Z7982 Long term (current) use of aspirin: Secondary | ICD-10-CM | POA: Insufficient documentation

## 2010-04-15 DIAGNOSIS — E785 Hyperlipidemia, unspecified: Secondary | ICD-10-CM | POA: Insufficient documentation

## 2010-04-15 DIAGNOSIS — R1013 Epigastric pain: Secondary | ICD-10-CM | POA: Insufficient documentation

## 2010-04-15 DIAGNOSIS — I10 Essential (primary) hypertension: Secondary | ICD-10-CM | POA: Insufficient documentation

## 2010-04-18 NOTE — Op Note (Signed)
  NAME:  Rachel, [name]             ACCOUNT NO.:  [id number]  MEDICAL RECORD NO.:  [id number]           PATIENT TYPE:  O  LOCATION:  [facility]                          FACILITY:  [facility]  PHYSICIAN:  R. Roetta Sessions, M.D. DATE OF BIRTH:  Feb 19, 1950  DATE OF PROCEDURE:  04/15/2010 DATE OF DISCHARGE:                              OPERATIVE REPORT   PROCEDURE:  Diagnostic EGD.  INDICATIONS FOR PROCEDURE:  A 60 year old lady with epigastric right upper quadrant abdominal pain, nausea, and vomiting.  Feels symptoms started after she initiated aspirin, niacin, and Plavix recently.  She is also on Lipitor, has nagging right upper quadrant abdominal pain on exam today.  She has tenderness along her right costal margin.  Abdomen is benign.  Recent right upper quadrant ultrasound demonstrates possible mild fatty-appearing liver with no other hepatobiliary abnormalities.  EGD is now being done to further evaluate her symptoms.  Risks, benefits, limitations, alternatives, imponderables have been discussed, questions answered.  Please see the documentation in the medical record.  PROCEDURE NOTE:  O2 saturation, blood pressure, pulse, respirations were monitored throughout the entire procedure.  CONSCIOUS SEDATION:  Versed 3 mg IV, Demerol 75 mg IV in divided doses.  INSTRUMENT:  Pentax video chip system.  FINDINGS:  EGD examination of tubular esophagus revealed entirely normal esophageal mucosa.  EG junction easily traversed.  Stomach:  Gas cavity was emptied and insufflated well with air. Thorough examination of gastric mucosa including retroflexion of proximal stomach, esophagogastric junction demonstrated no abnormalities.  Pylorus was patent, easily traversed.  Examination of the bulb and second portion revealed no abnormalities.  THERAPEUTIC/DIAGNOSTIC MANEUVERS PERFORMED:  None.  The patient tolerated procedure well, was reactive after endoscopy.  IMPRESSION:  Normal esophagus, stomach  D1 and D2.  I am suspicious niacin may be contributing to her dyspeptic symptoms along with nausea.  Plavix and aspirin less likely be contributing factors.  No evidence of hepatobiliary pathology, elevated alkaline phosphatase is being fractionated.  RECOMMENDATIONS: 1. For now recommended [name] simply stop the niacin for the next     month.  Continue aspirin and Plavix, add 68-month course of Protonix     40 mg orally daily.  I have asked her to take the Protonix in the     evening and the Plavix in the morning or vice versa. 2. Plan to see her back in the office in 1 month and reassess.     Jonathon Bellows, M.D.     RMR/MEDQ  D:  04/15/2010  T:  04/15/2010  Job:  [id number]  cc:   Donna Bernard, M.D. Fax: [phone]  Electronically Signed by Lorrin Goodell M.D. on 04/18/2010 10:29:46 AM

## 2010-04-19 NOTE — Letter (Signed)
Summary: EGD ORDER  EGD ORDER   Imported By: Ave Filter 04/12/2010 14:48:57  _____________________________________________________________________  External Attachment:    Type:   Image     Comment:   External Document

## 2010-04-21 LAB — COMPREHENSIVE METABOLIC PANEL
Albumin: 3.7 g/dL (ref 3.5–5.2)
Alkaline Phosphatase: 151 U/L — ABNORMAL HIGH (ref 39–117)
BUN: 8 mg/dL (ref 6–23)
Chloride: 105 mEq/L (ref 96–112)
Creatinine, Ser: 0.75 mg/dL (ref 0.4–1.2)
GFR calc non Af Amer: >60 mL/min (ref >60)
Glucose, Bld: 220 mg/dL — ABNORMAL HIGH (ref 70–99)
Total Bilirubin: 0.5 mg/dL (ref 0.3–1.2)

## 2010-04-21 LAB — DIFFERENTIAL
Basophils Absolute: 0.1 10*3/uL (ref 0.0–0.1)
Basophils Relative: 1 % (ref 0–1)
Lymphocytes Relative: 29 % (ref 12–46)
Monocytes Absolute: 0.4 10*3/uL (ref 0.1–1.0)
Neutro Abs: 5.3 10*3/uL (ref 1.7–7.7)
Neutrophils Relative %: 63 % (ref 43–77)

## 2010-04-21 LAB — APTT: aPTT: 26 seconds (ref 24–37)

## 2010-04-21 LAB — GLUCOSE, CAPILLARY

## 2010-04-21 LAB — POCT CARDIAC MARKERS
CKMB, poc: <1 ng/mL — ABNORMAL LOW (ref 1.0–8.0)
Myoglobin, poc: 64 ng/mL (ref 12–200)
Troponin i, poc: <0.05 ng/mL (ref 0.00–0.09)

## 2010-04-21 LAB — CBC
HCT: 41.3 % (ref 36.0–46.0)
MCH: 30 pg (ref 26.0–34.0)
MCV: 87.9 fL (ref 78.0–100.0)
Platelets: 297 10*3/uL (ref 150–400)
RBC: 4.7 MIL/uL (ref 3.87–5.11)

## 2010-04-21 LAB — CK TOTAL AND CKMB (NOT AT ARMC)
CK, MB: 1.1 ng/mL (ref 0.3–4.0)
Total CK: 94 U/L (ref 7–177)

## 2010-04-21 LAB — TROPONIN I: Troponin I: 0.01 ng/mL (ref 0.00–0.06)

## 2010-04-21 LAB — PROTIME-INR
INR: 1.07 (ref 0.00–1.49)
Prothrombin Time: 14.1 seconds (ref 11.6–15.2)

## 2010-05-07 ENCOUNTER — Other Ambulatory Visit: Payer: Self-pay | Admitting: Cardiology

## 2010-05-16 ENCOUNTER — Ambulatory Visit: Payer: 59 | Admitting: Gastroenterology

## 2010-05-16 LAB — CBC
MCHC: 35.1 g/dL (ref 30.0–36.0)
MCV: 88.3 fL (ref 78.0–100.0)
Platelets: 226 10*3/uL (ref 150–400)
RBC: 4.34 MIL/uL (ref 3.87–5.11)
WBC: 7.3 10*3/uL (ref 4.0–10.5)

## 2010-05-16 LAB — DIFFERENTIAL
Basophils Absolute: 0 10*3/uL (ref 0.0–0.1)
Eosinophils Relative: 1 % (ref 0–5)
Lymphocytes Relative: 19 % (ref 12–46)
Monocytes Absolute: 0.6 10*3/uL (ref 0.1–1.0)

## 2010-05-16 LAB — BASIC METABOLIC PANEL
BUN: 8 mg/dL (ref 6–23)
CO2: 26 mEq/L (ref 19–32)
Calcium: 9 mg/dL (ref 8.4–10.5)
Chloride: 97 mEq/L (ref 96–112)
Creatinine, Ser: 0.81 mg/dL (ref 0.4–1.2)
GFR calc Af Amer: >60 mL/min (ref >60)

## 2010-05-16 LAB — POCT CARDIAC MARKERS: Troponin i, poc: <0.05 ng/mL (ref 0.00–0.09)

## 2010-05-18 ENCOUNTER — Ambulatory Visit (INDEPENDENT_AMBULATORY_CARE_PROVIDER_SITE_OTHER): Payer: 59 | Admitting: Gastroenterology

## 2010-05-18 ENCOUNTER — Encounter: Payer: Self-pay | Admitting: Gastroenterology

## 2010-05-18 DIAGNOSIS — R1011 Right upper quadrant pain: Secondary | ICD-10-CM

## 2010-05-18 DIAGNOSIS — R1310 Dysphagia, unspecified: Secondary | ICD-10-CM

## 2010-05-18 MED ORDER — SUCRALFATE 1 GM/10ML PO SUSP: 1.0000 g | Freq: Four times a day (QID) | ORAL | Status: DC

## 2010-05-18 NOTE — Progress Notes (Signed)
Primary Care Physician: Lilyan Punt, MD  Primary Gastroenterologist:  Dr. Roetta Sessions  Chief Complaint  Patient presents with  . Abdominal Pain    RUQ       HPI: Rachel [name] is a 60 y.o. female here for followup of recent EGD. She has a history of right upper quadrant abdominal pain and epigastric discomfort. EGD in March was normal. She notes she has pain constantly but is worse since her procedure. She feels like she has a knot in the right upper quadrant. Pain is unrelated to meals. Applying pressure to the area hurts. She cannot wear a wire bra because of discomfort. She has increased pain with coughing. She describes it as stabbing. Bowel movements alternate from diarrhea to constipation. More diarrhea since January 2012. Only one episode daily. Complains of urgency with incontinence at times. No nocturnal symptoms. Really has more days without bowel movements rather than with loose stool. Denies melena or rectal bleeding. Notes the heartburn is improved since stopping niacin. On pantoprazole twice a day. Concerned about acute onset odynophagia which began one week ago. Denies fever or chills. No recent antibiotic use. Does not recall having the pills get stuck. Can only swallow liquids. Has pain associated with swallowing soft foods also.  Current Outpatient Prescriptions  Medication Sig Dispense Refill  . aspirin 81 MG tablet Take 81 mg by mouth daily.        Marland Kitchen atorvastatin (LIPITOR) 80 MG tablet Take 80 mg by mouth daily.        . B-D INS SYRINGE 0.5CC/31GX5/16 31G X 5/16" 0.5 ML MISC Inject 84 Units into the skin at bedtime.      Marland Kitchen glyBURIDE (DIABETA) 5 MG tablet Take 10 mg by mouth 2 (two) times daily with a meal.        . insulin glargine (LANTUS) 100 UNIT/ML injection Inject 84 Units into the skin at bedtime.        . metFORMIN (GLUCOPHAGE) 1000 MG tablet Take 1 tablet by mouth 2 (two) times daily.       . metFORMIN (GLUMETZA) 1000 MG (MOD) 24 hr tablet Take 1,000 mg by  mouth 2 (two) times daily with a meal.        . nitroGLYCERIN (NITROSTAT) 0.4 MG SL tablet Place 0.4 mg under the tongue every 5 (five) minutes as needed.        Marland Kitchen oxybutynin (DITROPAN-XL) 10 MG 24 hr tablet Take 10 mg by mouth 2 (two) times daily.        . pantoprazole (PROTONIX) 40 MG tablet Take 1 tablet by mouth daily.      Marland Kitchen PLAVIX 75 MG tablet TAKE 1 TABLET BY MOUTH EVERY DAY  30 tablet  2  . DISCONTD: amLODipine (NORVASC) 5 MG tablet Take 5 mg by mouth daily.        Marland Kitchen DISCONTD: niacin (NIASPAN) 500 MG CR tablet Take 1,000 mg by mouth at bedtime.          Allergies as of 05/18/2010  . (No Known Allergies)    ROS:  General: Negative for anorexia, weight loss, fever, chills, fatigue, weakness. ENT: Negative for hoarseness, difficulty swallowing , nasal congestion. CV: Negative for chest pain, angina, palpitations, dyspnea on exertion, peripheral edema.  Respiratory: Negative for dyspnea at rest, dyspnea on exertion, cough, sputum, wheezing.  GI: See history of present illness. GU:  Negative for dysuria, hematuria, urinary incontinence, urinary frequency, nocturnal urination.  Endo: Negative for unusual weight change.    Physical  Examination:   BP 165/98  Pulse 94  Temp 98 F (36.7 C)  Ht 5\' 3"  (1.6 m)  Wt 173 lb (78.472 kg)  BMI 30.65 kg/m2  SpO2 96%  General: Well-nourished, well-developed in no acute distress.  Eyes: No icterus. Mouth: Oropharyngeal mucosa moist and pink , no lesions erythema or exudate. Lungs: Clear to auscultation bilaterally.  Heart: Regular rate and rhythm, no murmurs rubs or gallops.  Abdomen: Bowel sounds are normal, mild to moderate tenderness with palpation in ruq and along right lower ribcage, nondistended, no hepatosplenomegaly or masses, no abdominal bruits or hernia , no rebound or guarding.   Extremities: No lower extremity edema.  Neuro: Alert and oriented x 4   Skin: Warm and dry, no jaundice.   Psych: Alert and cooperative, normal mood  and affect.

## 2010-05-18 NOTE — Assessment & Plan Note (Signed)
Acute onset of odynophagia. EGD in March was unremarkable, symptoms not present at that time. 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 Discussed this case with Dr. Jena Gauss. Treat with Carafate. Continue pantoprazole. She is to call us on Monday and let us know how she is doing. She may need to have another upper endoscopy if symptoms do not improve. Would not recommend empirical treatment for Candida or herpes esophagitis.

## 2010-05-18 NOTE — Assessment & Plan Note (Signed)
Chronic worsening right upper quadrant abdominal pain unrelated to meals. Recent EGD is normal. She clearly has some pain associated with palpation of the right lower rib cage margin which is likely musculoskeletal. Also with some tenderness in the right upper quadrant, focal, positive Carnett sign s/o abdominal wall pain. CT of the abdomen pelvis with contrast to rule out any other abnormalities. If CT is unremarkable consider anti-inflammatory trial as next step. Discussed this plan with patient she is in agreement.

## 2010-05-19 ENCOUNTER — Telehealth: Payer: Self-pay

## 2010-05-19 NOTE — Progress Notes (Signed)
Reviewed by R. Michael Rourk, MD FACP FACG 

## 2010-05-19 NOTE — Telephone Encounter (Signed)
Pt called wanting to know if LSL has talked to RMR yet. She said someone called her yesterday and she wasn't home. She is requesting a return phone call to her cell at [phone] and leave the information on her voicemail.

## 2010-05-19 NOTE — Progress Notes (Signed)
Pt called this morning. Left cell number to leave message on. Called pt and left instructions on voicemail.

## 2010-05-19 NOTE — Telephone Encounter (Signed)
Called pt- left instructions on voicemail

## 2010-05-19 NOTE — Telephone Encounter (Signed)
Discussed with Raynelle Fanning. Raynelle Fanning will call with instructions. See OV encounter for 05/18/10.

## 2010-05-23 ENCOUNTER — Other Ambulatory Visit: Payer: Self-pay | Admitting: Gastroenterology

## 2010-05-23 ENCOUNTER — Telehealth: Payer: Self-pay

## 2010-05-23 ENCOUNTER — Ambulatory Visit (HOSPITAL_COMMUNITY)
Admission: RE | Admit: 2010-05-23 | Discharge: 2010-05-23 | Disposition: A | Discharge status: Home / Self Care | Payer: 59 | Source: Ambulatory Visit | Attending: Gastroenterology | Admitting: Gastroenterology

## 2010-05-23 DIAGNOSIS — Q619 Cystic kidney disease, unspecified: Secondary | ICD-10-CM | POA: Insufficient documentation

## 2010-05-23 DIAGNOSIS — R1011 Right upper quadrant pain: Secondary | ICD-10-CM

## 2010-05-23 DIAGNOSIS — K228 Other specified diseases of esophagus: Secondary | ICD-10-CM | POA: Insufficient documentation

## 2010-05-23 DIAGNOSIS — E119 Type 2 diabetes mellitus without complications: Secondary | ICD-10-CM | POA: Insufficient documentation

## 2010-05-23 DIAGNOSIS — K2289 Other specified disease of esophagus: Secondary | ICD-10-CM | POA: Insufficient documentation

## 2010-05-23 DIAGNOSIS — R197 Diarrhea, unspecified: Secondary | ICD-10-CM | POA: Insufficient documentation

## 2010-05-23 DIAGNOSIS — R112 Nausea with vomiting, unspecified: Secondary | ICD-10-CM | POA: Insufficient documentation

## 2010-05-23 LAB — CREATININE, SERUM
Creatinine, Ser: 0.73 mg/dL (ref 0.4–1.2)
GFR calc non Af Amer: >60 mL/min (ref >60)

## 2010-05-23 MED ORDER — IOHEXOL 300 MG/ML  SOLN
100.0000 mL | Freq: Once | INTRAMUSCULAR | Status: AC | PRN
  Administered 2010-05-23: 100 mL via INTRAVENOUS

## 2010-05-23 NOTE — Telephone Encounter (Signed)
Let's wait for CT results. She may need to have another EGD.

## 2010-05-23 NOTE — Telephone Encounter (Signed)
Pt called- she has been taking the carafate since Thursday and her swallowing is not any better. Pt is going for CT today.

## 2010-05-24 LAB — GLUCOSE, CAPILLARY
Glucose-Capillary: 126 mg/dL — ABNORMAL HIGH (ref 70–99)
Glucose-Capillary: 203 mg/dL — ABNORMAL HIGH (ref 70–99)

## 2010-05-24 LAB — CBC
HCT: 35.6 % — ABNORMAL LOW (ref 36.0–46.0)
MCHC: 33.7 g/dL (ref 30.0–36.0)
MCHC: 34.5 g/dL (ref 30.0–36.0)
MCV: 87.8 fL (ref 78.0–100.0)
MCV: 87.7 fL (ref 78.0–100.0)
Platelets: 311 10*3/uL (ref 150–400)
Platelets: 244 10*3/uL (ref 150–400)
RDW: 13.8 % (ref 11.5–15.5)
RDW: 13.6 % (ref 11.5–15.5)
WBC: 7.7 10*3/uL (ref 4.0–10.5)
WBC: 7.1 10*3/uL (ref 4.0–10.5)

## 2010-05-24 LAB — BASIC METABOLIC PANEL
BUN: 7 mg/dL (ref 6–23)
Calcium: 9.2 mg/dL (ref 8.4–10.5)
Chloride: 102 mEq/L (ref 96–112)
Chloride: 103 mEq/L (ref 96–112)
Creatinine, Ser: 0.76 mg/dL (ref 0.4–1.2)
Creatinine, Ser: 0.64 mg/dL (ref 0.4–1.2)
GFR calc Af Amer: >60 mL/min (ref >60)
GFR calc non Af Amer: >60 mL/min (ref >60)
Glucose, Bld: 258 mg/dL — ABNORMAL HIGH (ref 70–99)
Potassium: 4 mEq/L (ref 3.5–5.1)

## 2010-05-24 LAB — CARDIAC PANEL(CRET KIN+CKTOT+MB+TROPI)
CK, MB: 1.3 ng/mL (ref 0.3–4.0)
Relative Index: INVALID (ref 0.0–2.5)
Total CK: 62 U/L (ref 7–177)
Troponin I: 0.03 ng/mL (ref 0.00–0.06)
Troponin I: 0.04 ng/mL (ref 0.00–0.06)

## 2010-05-24 LAB — HEPATIC FUNCTION PANEL
Albumin: 3.3 g/dL — ABNORMAL LOW (ref 3.5–5.2)
Indirect Bilirubin: 0.3 mg/dL (ref 0.3–0.9)
Total Protein: 7.3 g/dL (ref 6.0–8.3)

## 2010-05-24 LAB — PROTIME-INR
INR: 1 (ref 0.00–1.49)
Prothrombin Time: 13.2 seconds (ref 11.6–15.2)

## 2010-05-24 LAB — DIFFERENTIAL
Basophils Relative: 1 % (ref 0–1)
Eosinophils Absolute: 0.2 10*3/uL (ref 0.0–0.7)
Eosinophils Relative: 3 % (ref 0–5)
Neutrophils Relative %: 53 % (ref 43–77)

## 2010-05-24 LAB — HEPARIN LEVEL (UNFRACTIONATED): Heparin Unfractionated: 0.9 IU/mL — ABNORMAL HIGH (ref 0.30–0.70)

## 2010-05-24 LAB — HEMOGLOBIN A1C: Hgb A1c MFr Bld: 10.6 % — ABNORMAL HIGH (ref 4.6–6.1)

## 2010-05-24 LAB — LIPID PANEL
Total CHOL/HDL Ratio: 11 RATIO
Triglycerides: 198 mg/dL — ABNORMAL HIGH (ref <150)
VLDL: 40 mg/dL (ref 0–40)

## 2010-05-25 NOTE — Progress Notes (Signed)
CT faxed to Dr. Cathlyn Parsons office

## 2010-05-30 ENCOUNTER — Telehealth: Payer: Self-pay

## 2010-05-30 NOTE — Telephone Encounter (Signed)
Pt called- left voicemail- she stated she is "doing a little better that before". Pt wants to know what the plan is now. Is she going to need to take 3 weeks of Diflucan? Pt stated it was ok to leave detailed message on pts cell voicemail.

## 2010-05-31 MED ORDER — FLUCONAZOLE 100 MG PO TABS: 100.0000 mg | ORAL_TABLET | Freq: Every day | ORAL | Status: AC

## 2010-05-31 NOTE — Telephone Encounter (Signed)
Tried to call pt- LMOM 

## 2010-05-31 NOTE — Telephone Encounter (Signed)
Continue Diflucan for three weeks (per RMR plans if patient showed improvement). If symptoms do not resolve she needs to let us know.  RX sent to [facility].  Please have patient hold Lipitor while on Diflucan, risk of drug interaction.

## 2010-06-01 NOTE — Telephone Encounter (Signed)
Pt aware.

## 2010-06-08 ENCOUNTER — Other Ambulatory Visit: Payer: Self-pay

## 2010-06-08 MED ORDER — PANTOPRAZOLE SODIUM 40 MG PO TBEC: 40.0000 mg | DELAYED_RELEASE_TABLET | Freq: Every day | ORAL | Status: DC

## 2010-06-16 ENCOUNTER — Encounter: Payer: Self-pay | Admitting: Gastroenterology

## 2010-06-16 NOTE — Telephone Encounter (Signed)
Opened in error

## 2010-06-21 ENCOUNTER — Telehealth: Payer: Self-pay

## 2010-06-21 NOTE — Consult Note (Signed)
[name], [name]             ACCOUNT NO.:  [id number]   MEDICAL RECORD NO.:  [id number]          PATIENT TYPE:  INP   LOCATION:  A318                          FACILITY:  [facility]   PHYSICIAN:  Gerrit Friends. Dietrich Pates, MD, FACCDATE OF BIRTH:  [birth year]   DATE OF CONSULTATION:  03/17/2008  DATE OF DISCHARGE:                                 CONSULTATION   REASON FOR CONSULTATION:  Chest pain.   HISTORY OF PRESENT ILLNESS:  [name] is a 60 year old female patient  with a history of multiple cardiac risk factors, who was admitted to  [facility] from her primary care physician's office today with  complaints of progressively-worsening exertional substernal chest  pressure.  This has been ongoing for the last two weeks.  As noted, it  has been progressively getting worse.  She notes it in her left chest  and her shoulder, as well as in her left arm.  She has associated  shortness of breath.  She also notes nausea and diaphoresis at times.  She gets the symptoms with just minimal activity.  She actually gets  chest discomfort while she is walking around in her hospital room.  Her  initial EKG is non-acute.  We are asked to further evaluation.   PAST MEDICAL HISTORY:  1. Diabetes mellitus times 10 years.  2. Hypertension.  3. Hyperlipidemia.  4. GERD.  5. History of esophageal dilatation.   MEDICATIONS AT HOME:  1. Protonix 40 mg daily.  2. Lipitor 40 mg q.h.s.  3. Metformin 1 g b.i.d.  4. Hyzaar 50/12.5 mg daily.  5. Ditropan XL 20 mg daily.  6. Aspirin 81 mg daily.  7. Lantus insulin 44 units q.h.s.   ALLERGIES:  No known drug allergies.   SOCIAL HISTORY:  The patient lives in [place].  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  She is separated, has one  daughter.  She smokes a pack of cigarettes a day and has a 40+ pack-year  history.  She denies alcohol abuse.   FAMILY HISTORY:  Insignificant for premature CAD.  Her mother is alive  with Alzheimer's and diabetes.  Her father died of renal carcinoma.  She  did have a maternal grandmother who had a myocardial infarction in her  sixties.   REVIEW OF SYSTEMS:  Please see HPI.  She denies fevers, chills,  headache, rash, dysuria, hematuria, bright red blood per rectum or  melena, dysphagia, orthopnea, PND, pedal edema, palpitations, syncope,  near-syncope, claudication or cough.  Rest of the review of systems is  negative.   PHYSICAL EXAM:  GENERAL:  She is a well-nourished, well-developed  female, in no acute distress.  VITAL SIGNS:  Blood pressure 133/78, pulse 99, respirations 18,  temperature 98, oxygen saturation 96% on 2 L.  HEENT:  Normal.  NECK:  Without JVD.  LYMPH:  Without lymphadenopathy.  ENDOCRINE:  Without thyromegaly.  CARDIAC:  Normal S1, S2.  Regular rate and rhythm without murmur.  LUNGS:  Clear to auscultation bilaterally without wheezing, rhonchi or  rales.  ABDOMEN:  Soft, nontender, with normoactive bowel  sounds, no  organomegaly.  EXTREMITIES:  Without clubbing, cyanosis or edema.  MUSCULOSKELETAL:  Without joint deformity.  NEUROLOGIC:  She is alert and oriented times three.  Cranial nerves II  through XII are grossly intact.  SKIN:  Without rash.  VASCULAR EXAM:  Without carotid bruits bilaterally.  No femoral artery  bruits are noted bilaterally.   CHEST X-RAY:  Pending.   EKG:  Sinus rhythm with a heart rate of 92, normal axis, poor R-wave  progression, nonspecific STT-wave changes.   LABORATORY DATA:  White count 7700, hemoglobin 13.9, platelet count  311,000.  Potassium 3.7, creatinine 0.76.  CK 53, CK-MB 1.4, troponin I  0.03.  INR 1.0.   ASSESSMENT:  1. Chest pain syndrome in a 60 year old female with multiple cardiac      risk factors, concerning for unstable angina pectoris.  2. Hypertension.  3. Diabetes mellitus.  4. Hyperlipidemia.  5. Tobacco abuse.  6. Gastroesophageal reflux disease.   RECOMMENDATIONS:  The  patient was also interviewed and examined by Dr.  Dietrich Pates.  The patient presents with classic anginal symptoms and high  cardiovascular risk.  She has had no symptoms at rest.  She will be  continued on aspirin.  Her Lovenox will be changed to IV heparin per  pharmacy.  We will place her on Lopressor 25 mg b.i.d.  Her metformin  will be held for cardiac catheterization.  Risks and benefits of  proceeding with cardiac  catheterization were discussed with the patient.  We will arrange  transfer to [facility] March 18, 2008 for cardiac  catheterization.   Thank you very much for the consultation.  Further recommendations to  follow after the patient's procedure.      [name], PA-C      Gerrit Friends. Dietrich Pates, MD, [facility]  Electronically Signed    SW/MEDQ  D:  03/18/2008  T:  03/18/2008  Job:  [id number]

## 2010-06-21 NOTE — Consult Note (Signed)
NAME:  Rachel [name], Rachel [name]             ACCOUNT NO.:  [id number]   MEDICAL RECORD NO.:  [id number]          PATIENT TYPE:  AMB   LOCATION:  DAY                           FACILITY:  [facility]   PHYSICIAN:  R. Roetta Sessions, M.D. DATE OF BIRTH:  1950-04-08   DATE OF CONSULTATION:  DATE OF DISCHARGE:                                 CONSULTATION   REQUESTING PHYSICIAN:  Donna Bernard, MD   REASON FOR CONSULTATION:  Odynophagia/dysphagia.   HISTORY OF PRESENT ILLNESS:  Rachel [name] is a 60 year old female who was  eating at [facility] approximately 2 weeks ago.  She felt as though my  food did not go down.  She complained of severe odynophagia, which  lasted for about a week.  She felt as though all of her food became  lodged retrosternally.  She also had some regurgitation.  She rates the  pain at 9/10 on pain scale.  She was also having pain to her epigastrium  along with heartburn and indigestion.  She notes the pain radiated to  bilateral flanks.  She was told that she had ulcers in her throat and on  her tongue.  She had started Kapidex 60 mg daily and has been on this  for about 2 weeks now.  She did try 2 GI cocktails, which did not seem  to help.  She denies any fever or chills with this episode.  Denies any  jaundice.  Denies any new medications prior to the onset of the  symptoms.  Denies any nausea, vomiting, diarrhea, constipation, rectal  bleeding, or melena.  She did have an abdominal ultrasound on Jun 12, 2007, which showed probable fatty infiltration of the liver.  She had a  chest x-ray, which showed mild bronchial thickening.   PAST MEDICAL AND SURGICAL HISTORY:  1. Insulin-dependent diabetes mellitus.  2. Hypertension.  3. Hyperlipidemia.  4. Pilonidal cyst.   CURRENT MEDICATIONS:  1. Aspirin 81 mg daily.  2. Metformin 1 g b.i.d.  3. Glyburide 10 mg t.i.d.  4. Oxybutynin 10 mg b.i.d.  5. Lipitor 40 mg daily.  6. Avandia 4 mg b.i.d.  7. Kapidex 60 mg daily.  8. Hyzaar  10 mg daily.  9. Lantus 21 units nightly.   ALLERGIES:  No known drug allergies.   FAMILY HISTORY:  There is no known family history of colorectal  carcinoma or liver or chronic GI problems.  Mother is alive and has  history of CVA and dementia.  Father deceased at 61 secondary to renal  carcinoma.  She has 3 living siblings and 2 deceased.   SOCIAL HISTORY:  Rachel [name] is separated.  She lives with her daughter  and granddaughter.  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  She has a 40-pack-a-year history of tobacco use.  Denies any alcohol or  drug use.   REVIEW OF SYSTEMS:  CONSTITUTIONAL:  Her weights remained stable.  Denies any fever or chills.  Negative review of systems, see HPI.   PHYSICAL EXAMINATION:  VITAL SIGNS:  Weight 200 pounds, height 64  inches, temperature 97.6, blood pressure  128/84, and pulse 76.  GENERAL:  The patient is an obese [place] female who is alert,  oriented, pleasant, and cooperative in no acute distress.  HEENT:  PERRLA.  Sclerae nonicteric.  Conjunctivae pink.  Oropharynx  pink and moist without any lesions.  NECK:  Supple without any evidence of thyromegaly.  CHEST:  Heart, regular rate and rhythm.  Normal S1 and S2 without any  murmurs, clicks, rubs, or gallops.  LUNGS:  Clear auscultation bilaterally.  ABDOMEN:  Positive bowel sounds x4.  No bruits auscultated.  Soft and  nondistended.  She has mild tenderness in epigastrium on deep palpation.  There is no rebound tenderness or guarding.  No hepatosplenomegaly or  mass.  Exam is limited given the patient's body habitus.  EXTREMITIES:  Without clubbing or edema bilaterally.  SKIN:  Warm and dry without any rash or jaundice.   IMPRESSION:  Rachel [name] is a 60 year old female with a 2-week history  of severe odynophagia and dysphagia where she feels as though her food  is being lodged retrosternally.  She also has had some epigastric pain,  heartburn, and indigestion.  She  is going to require further evaluation  to rule out erosive/ulcerative esophagitis and complicated  gastroesophageal reflux disease including the development of web, ring,  stricture.   She has never had a screening colonoscopy and will undergo this at the  same time.   She did have a negative abdominal ultrasound for stones, but does have  fatty infiltration of the liver.  We will need to check liver function  tests.  Biliary etiology cannot be excluded at this point yet.   PLAN:  1. Check LFTs and CBC today.  2. Screening colonoscopy and EGD with possible esophageal dilatation      with Dr. Jena Gauss in the near future.  I discussed procedure, risks,      and benefits including but not limited to bleeding, infection,      perforation, and drug reaction.  She agrees to the plan and consent      will be obtained.  3. She is to take half of her Lantus tonight before the procedure and      half of her metformin and glyburide a day prior to another      procedure.   Thank you Dr. Gerda Diss for allowing [place] to participate in the care of [name].      Lorenza Burton, N.P.      Jonathon Bellows, M.D.  Electronically Signed    KJ/MEDQ  D:  06/25/2007  T:  06/26/2007  Job:  [id number]   cc:   Donna Bernard, M.D.  Fax: [phone]

## 2010-06-21 NOTE — Assessment & Plan Note (Signed)
[facility]                       [place] CARDIOLOGY OFFICE NOTE   Rachel [name], Rachel [name]                    MRN:          [id number]  DATE:04/07/2008                            DOB:          02/28/1950    CARDIOLOGIST:  Rachel Friends. Dietrich Pates, MD, [facility]   PRIMARY CARE PHYSICIAN:  Rachel Bernard, MD   REASON FOR VISIT:  Two-week followup.   HISTORY OF PRESENT ILLNESS:  Rachel [name] is a 60 year old female patient  with history of coronary artery disease status post drug-eluting stent  placement x2 to the mid RCA in February 2010 who returns today for  followup.  When I last saw her on March 25, 2008, she was complaining  of some atypical chest discomfort.  I placed her on Pepcid as well as  Imdur.  She has started in cardiac rehab since that time.  She has  actually done very well without any exertional chest tightness or  heaviness or shortness of breath.  She did note some jaw pain on her  first day and she went to see Dr. Gerda [name].  She apparently had some  swollen lymph nodes and was treated for what sounds like lymphadenitis.  This has resolved.  Over the last 2 days, she has noted some left  subscapular pain that she feels radiating to her shoulder and left  chest.  She says that this was a similar symptom to what she had prior  to her exertional angina beginning several weeks ago.  However, she  denies any exertional heaviness or tightness in her chest or shortness  of breath.  She denies any nausea, diaphoresis, syncope or presyncope,  orthopnea, PND, or pedal edema.   CURRENT MEDICATIONS:  Metformin 1 g b.i.d., Glyburide 5 mg 2 tablets  b.i.d., Plavix 75 mg daily, Metoprolol 25 mg b.i.d., Oxybutynin 10 mg  b.i.d., Lipitor 80 mg daily, Aspirin 325 mg daily,  Lantus 44 units at bedtime, Pepcid 20 mg b.i.d., Imdur 30 mg daily.   ALLERGIES:  No known drug allergies.   PHYSICAL EXAMINATION:  GENERAL:  She is a well-nourished,  well-developed  female in no acute distress.  VITAL SIGNS:  Blood pressure is 124/80, pulse 78, weight 195 pounds.  HEENT:  Normal.  NECK:  Without JVD.  CARDIAC:  S1 and S2.  Regular rate and rhythm.  LUNGS:  Clear to auscultation bilaterally.  ABDOMEN:  Soft, nontender.  EXTREMITIES:  Without edema.  NEUROLOGIC:  She is alert and oriented x3.  Cranial nerves II through  XII grossly intact.  MUSCULOSKELETAL:  She is point tender over her rhomboids on the left and  this reproduces the pain that she has been describing today.   Electrocardiogram reveals sinus rhythm at 81, normal axis, QTC of 464  milliseconds.  No ischemic changes.   ASSESSMENT AND PLAN:  1. Coronary artery disease status post prior drug-eluting stent      placement x2 in the mid right coronary artery on March 16, 2008.      She has residual CAD as previously outlined.  Specifically, she has      a 40-50% distal left  main stenosis.  This did not seem to be flow      limiting at the time of her cardiac catheterization.  She is      currently not having any symptoms of exertional angina.  I have      recommended that she continue cardiac rehab.  She will continue her      medicines as outlined above.  She will be brought back in routine      followup.  2. Left shoulder pain.  She seems to have strained her left rhomboid      group.  I have asked her to have her daughter massage this and she      can also use heat applied 2-3 times a day for the next several      days.  She can take Tylenol p.r.n. pain.  She knows to contact us      if her symptoms should start to morph and she develops exertional      chest pain or exertional shortness of breath.  3. Hypertension.  This is well-controlled.  4. Dyslipidemia.  This is followed by Dr. Gerda [name].  Her goal LDL is      less than 70.  5. Diabetes mellitus.  This followed by Dr. Gerda [name]  6. Tobacco abuse.  She is still trying to quit.   DISPOSITION:  The patient will  follow up with Dr. Dietrich [name] in 3 months  or sooner p.r.n.      Rachel Newcomer, PA-C  Electronically Signed      Rachel Friends. Dietrich Pates, MD, [facility]  Electronically Signed   SW/MedQ  DD: 04/07/2008  DT: 04/08/2008  Job #: [id number]   cc:   Rachel [name], M.D.

## 2010-06-21 NOTE — Op Note (Signed)
NAME:  Rachel [name], Rachel [name]             ACCOUNT NO.:  [id number]   MEDICAL RECORD NO.:  [id number]          PATIENT TYPE:  AMB   LOCATION:  DAY                           FACILITY:  [facility]   PHYSICIAN:  R. Roetta Sessions, M.D. DATE OF BIRTH:  1950-10-30   DATE OF PROCEDURE:  DATE OF DISCHARGE:                               OPERATIVE REPORT   EGD with Elease Hashimoto dilation followed by colonoscopy with polyp ablation  snare polypectomy.   INDICATIONS FOR PROCEDURE:  A 60 year old lady with recent 3-4 week  history of odynophagia and dysphagia, some epigastric pain, and  indigestion symptoms.  She has never had a screening colonoscopy.  EGD  is now being done along with screening colonoscopy.  Risks, benefits,  and alternatives have been reviewed, questions answered.  She is  agreeable.  Please see the documentation in the medical record.   PROCEDURE NOTE:  O2 saturation, blood pressure, pulse, abd respirations  were monitored through the entire procedure.   CONSCIOUS SEDATION:  Versed 6 mg IV and Demerol 125 mg IV in divided  doses.   INSTRUMENT:  Pentax video chip system.   FINDINGS:  EGD examination tubular esophagus revealed normal tubular  esophagus.  Mucosa appeared entirely normal.  EG junction is patent and  easily traversed.  Stomach: Gastric cavity was empty and insufflated  well with air.  Thorough examination of the gastric mucosa including  retroflexion at the proximal stomach and esophagogastric junction  demonstrated only a small hiatal hernia.  Pylorus was patent and easily  traversed.  Examination of the bulb, second portion revealed no  abnormalities.  Therapeutic/diagnostic maneuvers performed:  Scope was  withdrawn, a 54-French Maloney dilator was passed to full insertion with  ease.  Look back revealed no apparent complication with the passage of  dilator.  It is notable that the pyloric channel was somewhat friable.  There is no erosion or ulcer crater or infiltrating  process.  The  patient tolerated the procedure well and was prepared for colonoscopy.  Digital rectal exam revealed no abnormalities.  Endoscopic findings:  Prep was satisfactory.  Colon:  Colonic mucosa was surveyed from the  rectosigmoid junction through the left transverse, right colon,  appendiceal orifice, ileocecal valve, and cecum.  These structures were  well seen and photographed for the record.  From this level, the scope  was slowly withdrawn.  All previously mentioned mucosal surfaces were  again seen.  The patient was noted to have a 7-mm polyp in the ascending  colon, which was removed with hot snare cautery recovered through the  scope.  The patient had some 3 diminutive rectosigmoid polyps, which  were ablated with the tip of the hot snare cautery unit.  The remainder  of colonic mucosa appeared normal.  Scope was pulled down the rectum  where a thorough examination of the rectal mucosa including retroflex  view of the anal verge demonstrated a 6-mm flat adenomatous-appearing  polyp 15 cm from the anal verge that was removed with hot snare cautery  recovered through the scope.  There were 2 more diminutive polyps in the  rectum, which  were ablated with the tip of the hot snare cautery unit.  Remainder of the rectal mucosa appeared unremarkable.  The patient  tolerated both procedures and was reactive to endoscopy.   IMPRESSION:  1. Esophagogastroduodenoscopy small hiatal hernia, otherwise normal      esophagus, status post passage of a Maloney dilator as described      above.  2. Somewhat friable pyloric channel, but otherwise normal-appearing      mucosa, small hiatal hernia otherwise normal stomach, D1 and D2.   COLONOSCOPY FINDINGS:  1. Diminutive rectal and sigmoid polyps ablated with tip of the hot      snare cautery unit, flat adenomatous-appearing polyp, 15 cm status      post hot snare cautery removal, otherwise unremarkable rectum.  2. Many of sigmoid polyps  were ablated as described above.  3. A 7-mm polyp in the mid ascending colon removed with hot snare      cautery and recovered.  Remainder of the colonic mucosa appeared      unremarkable.   RECOMMENDATIONS:  1. Follow up on path.  2. Further recommendations to follow.      Jonathon Bellows, M.D.  Electronically Signed     RMR/MEDQ  D:  07/08/2007  T:  07/09/2007  Job:  [id number]   cc:   Lorin Picket A. Gerda Diss, MD  Fax: [phone]

## 2010-06-21 NOTE — Assessment & Plan Note (Signed)
[facility]                       [place] CARDIOLOGY OFFICE NOTE   [name], SAYED                    MRN:          [id number]  DATE:07/22/2008                            DOB:          04/14/1950    CARDIOLOGIST:  Gerrit Friends. Dietrich Pates, MD, [facility]   PRIMARY CARE PHYSICIAN:  Donna Bernard, MD   REASON FOR VISIT:  Followup.   HISTORY OF PRESENT ILLNESS:  [name] is a 60 year old female patient  with history of coronary artery disease, status post drug-eluting  placement x2 to the mid RCA in February 2010, who returns for followup.  She is overall doing well from a cardiovascular standpoint.  She was  unable to continue with cardiac rehab secondary to her job.  She has  been trying to walk on her own.  She denies any recurrence of exertional  angina or shortness of breath.  She denies orthopnea, PND, or pedal  edema.  However, she has had significant problems with acid reflux  disease.  She has seen Dr. Gerda Diss on several occasions and has had  multiple medication changes related to this.  She has episodic  epigastric burning.  She has been placed on a proton pump inhibitor as  well as sucralfate.  Some of her symptoms have improved.  She is  concerned that some of her medications maybe contributing to her  symptoms.  She sees Dr. Gerda Diss back in the next several weeks in  followup.   CURRENT MEDICATIONS:  1. Metformin 1 g b.i.d.  2. Glyburide 5 mg 2 tablets b.i.d.  3. Plavix 75 mg daily.  4. Metoprolol 25 mg b.i.d.  5. Oxybutynin 10 mg b.i.d.  6. Lipitor 80 mg daily.  7. Aspirin 325 mg daily.  8. Lantus 44 units nightly.  9. Imdur 30 mg daily.  10.Sucralfate 1 g q.a.c. and nightly.  11.Losartan/hydrochlorothiazide 50/12.5 mg daily.  12.Omeprazole 20 mg b.i.d.  13.Alprazolam 0.5 mg nightly.   ALLERGIES:  No known drug allergies.   SOCIAL HISTORY:  Unfortunately, she continues to smoke.  She does drink  some caffeine.   PHYSICAL EXAMINATION:  GENERAL:  She is a well-nourished, well-developed  female in no acute distress.  VITAL SIGNS:  Blood pressure is 110/74, pulse 66, and weight 183 pounds.  HEENT:  Normal.  NECK:  Without JVD.  CARDIAC:  S1 and S2.  Regular rate and rhythm.  LUNGS:  Clear to auscultation bilaterally.  ABDOMEN:  Soft, nontender, normoactive bowel sounds.  No organomegaly.  EXTREMITIES:  Without edema.  NEUROLOGIC:  She is alert and oriented x3.  Cranial nerves II through  XII are grossly intact.   ASSESSMENT/PLAN:  1. Coronary artery disease, status post drug-eluting stent placement      to the right coronary artery x2 in February 2010.  She seems to be      stable.  She is not describing any symptoms of exertional angina at      this time.  I have recommended that she go ahead and decrease her      aspirin to 81 mg a day.  She will continue on Plavix  for now.  She      knows the importance of continuing on dual antiplatelet therapy.      Imdur is likely not providing any benefit to her at this time.  She      can discontinue this medication.  She knows to contact if she has      any change in her symptoms.  2. Gastroesophageal reflux disease.  Unfortunately, she has had      multiple problems with her stomach over the last several months.      She has seen Dr. Gerda Diss on several occasions and has a followup      pending with him soon.  Currently, she is on omeprazole therapy.  I      have discussed with her the possibility of proton pump inhibitors      interfering with Plavix.  At this time, it seems that omeprazole      and esomeprazole are the proton pump inhibitors to be avoided in      the setting of Plavix therapy.  It is felt that medicines like      Protonix are likely safe.  In light of her drug-eluting stent, I      have recommended discontinuing her omeprazole and switching her to      Protonix 40 mg a day.  We had a long discussion about this and she      agrees to  make the switch.  As noted above, I have also asked her      to decrease her aspirin 81 mg a day.  I have also explained to her      that she should continue regular followup with Dr. Gerda Diss for      further evaluation as necessary.  3. Hypertension.  This is fairly well controlled.  I explained to her      the importance of continuing to take losartan and metoprolol.      Hopefully, her blood pressure will not change that much with      discontinuation of her isosorbide.  4. Dyslipidemia.  This is followed by Dr. Gerda Diss.  She will continue      on Lipitor.   DISPOSITION:  The patient will be brought back in followup with Dr.  Dietrich Pates in 3 months or sooner p.r.n.  Of note, I completed her FMLA  extension forms today.     [name], PA-C  Electronically Signed      Gerrit Friends. Dietrich Pates, MD, [facility]  Electronically Signed   SW/MedQ  DD: 07/22/2008  DT: 07/23/2008  Job #: [id number]   cc:   Donna Bernard, M.D.

## 2010-06-21 NOTE — Discharge Summary (Signed)
Rachel [name], Rachel [name]             ACCOUNT NO.:  [id number]   MEDICAL RECORD NO.:  [id number]          PATIENT TYPE:  INP   LOCATION:  A318                          FACILITY:  [facility]   PHYSICIAN:  Verne Carrow, MDDATE OF BIRTH:  Aug 09, 1950   DATE OF ADMISSION:  03/17/2008  DATE OF DISCHARGE:  03/19/2008                               DISCHARGE SUMMARY   PRIMARY CARDIOLOGIST:  Gerrit Friends. Dietrich Pates, MD, [facility]   INTERVENTIONAL CARDIOLOGIST:  Verne Carrow, MD   PRIMARY CARE PHYSICIAN:  Donna Bernard, MD   PROCEDURES PERFORMED DURING HOSPITALIZATION:  1. Cardiac catheterization completed on March 18, 2008, by Dr.      Verne Carrow.      a.     Severe single vessel coronary artery disease of the right       coronary artery, left main coronary artery disease with moderate       stenosis.  There is a 40-50% stenosis, non-flow limiting, moderate       nonobstructive disease in the circumflex coronary artery at 50% in       the ostium and midportion with 40% in the mid circumflex.  2. Successful PCI and intervention with placement of 2 drug-eluting      Xience overlapping stents in the mid right coronary artery.  Normal      left ventricular systolic function estimated at 55%.   FINAL DISCHARGE DIAGNOSES:  1. Coronary artery disease.      a.     Status post cardiac catheterization, March 18, 2008.      b.     Status post percutaneous coronary intervention to the right       coronary artery.      c.     Nonobstructive disease elsewhere to include left main       coronary artery disease with moderate stenosis (please see Dr.       Cristal Deer McAlhany's thorough cardiac catheterization note for       more details).  2. Diabetes, not well controlled.  3. Hypertension.  4. Hyperlipidemia.  5. Tobacco abuse.   HOSPITAL COURSE:  A 60 year old [place] American female with history as  stated above, who was seen in the office of Dr. Lilyan Punt with  complaints  of several weeks of chest discomfort occurring with walking  significant distances especially to her workplace.  The patient stated  that the pain was located in the center of her chest and the left  anterior area radiating to the left arm and described it as aching,  sometimes it was radiating to the back.  The patient was seen by Dr.  Gerda Diss and admitted for evaluation concerning this to [facility].   Dr. Belmar Bing accepted the patient on transfer to [facility] and cardiac catheterization was requested and performed by Dr.  Verne Carrow on March 18, 2008.  Please see Dr. Gibson Ramp  thorough cardiac catheterization note for more details.  The patient had  subsequent PCI to the right coronary artery using 2 Xience drug-eluting  stents without reoccurrence of discomfort.  The patient tolerated  the  procedure well without evidence of arrhythmias.  The patient was  counseled on smoking cessation along with better control of diabetes as  an outpatient as her hemoglobin A1c was elevated.  On the day of  discharge, the patient was seen and examined by Dr. Verne Carrow  and found to be stable.  The patient will need close followup with  primary care physician to evaluate for diabetes management.  The patient  will also have close followup with Dr. Waverly Bing within a week at  the [facility] office.  The patient has been counseled on tobacco  cessation and may need extra help as an outpatient through her primary  care physician at his discretion.  The patient has been given new  prescriptions for Plavix, nitroglycerin, and metoprolol.  She has been  advised to hold her metformin today and restart on March 20, 2008,  and to call Dr. Gerda Diss for blood glucose coverage as her blood glucose  remained elevated during hospitalization on current dose of Lantus  insulin.   DISCHARGE LABORATORY DATA:  Sodium 133, potassium 4.0, chloride 103, CO2  of  22, BUN 7, creatinine 0.64, glucose 258.  Hemoglobin 12.3, hematocrit  35.6, white blood cells 7.1, platelets 244.  Hemoglobin A1c was 10.6 on  admission.  Cholesterol 252, triglycerides 198, HDL 23, LDL 189.  Cardiac enzymes during hospitalization were found to be negative with a  troponin of 0.03 and 0.04 respectively.  Chest x-ray dated March 17, 2008, revealed low lung volumes with bibasilar atelectasis.  No new  focal finding otherwise.   DISCHARGE MEDICATIONS:  1. Lantus insulin 44 units at bedtime.  2. Protonix 40 mg daily.  3. Hyzaar 50/12.5 mg daily.  4. Aspirin 325 daily.  5. Ditropan XL 20 mg daily.  6. Metformin 1000 mg twice a day to start on March 20, 2008.  The      patient has been advised to monitor blood glucose and call Dr.      Gerda Diss for coverage.  7. Lipitor 80 mg at bedtime (new increased dose).  8. Plavix 75 mg daily.  9. Metoprolol 25 mg twice a day.  10.Nitroglycerin sublingual 0.4 mg under tongue p.r.n. chest pain.      The patient has been advised to refill every 3 months to assure      potency.   FOLLOWUP PLANS AND APPOINTMENT:  1. The patient will follow up with Dr. [place] Bing in 1 week.  As      this is before office hours, the patient will be called by our      office, a message has been left.  2. The patient will follow up with Dr. Lilyan Punt.  The patient is      to call on her own accord for a close followup.  3. The patient has been given post cardiac catheterization      instructions with particular emphasis on the right groin site for      evidence of bleeding, hematoma, signs of infection, and a phone      number has been given to her for phone calls should these occur.  4. The patient has been encouraged on smoking cessation.  5. The patient has been advised on new medications and increased dose      of Lipitor and prescriptions have been provided.   Time spent with the patient to include physician time, 35 minutes.       Bettey Mare. Lyman Bishop, NP  Verne Carrow, MD  Electronically Signed    KML/MEDQ  D:  03/19/2008  T:  03/19/2008  Job:  [id number]   cc:   Lorin Picket A. Gerda Diss, MD

## 2010-06-21 NOTE — Telephone Encounter (Signed)
Pt called- was doing well while taking the diflucan. Since finishing it the pain has come back again. Pt wants to know what she needs to do. Please advise.

## 2010-06-21 NOTE — H&P (Signed)
Rachel [name], Rachel [name]             ACCOUNT NO.:  [id number]   MEDICAL RECORD NO.:  [id number]          PATIENT TYPE:  INP   LOCATION:  A318                          FACILITY:  [facility]   PHYSICIAN:  Donna Bernard, M.D.DATE OF BIRTH:  [birth year]   DATE OF ADMISSION:  03/17/2008  DATE OF DISCHARGE:  LH                              HISTORY & PHYSICAL   CHIEF COMPLAINT:  Chest discomfort.   SUBJECTIVE:  This patient is a 60 year old African American female with  a history of multiple risk factors including hypertension, type 2  diabetes, hyperlipidemia and smoking who presented to the office the day  of admission with concerns regarding her chest.  The patient notes over  the last several weeks when she walks significant distances specifically  from her car to her workplace and even recently through a hallway of her  house she starts to notice a discomfort centered into the left anterior  chest, sometimes this radiates to the left arm.  She describes it as an  aching.  Sometimes it goes towards the back.  When it occurs she is  often short of breath with it.  She has experienced nausea.  She has  noticed slight diaphoresis.  The patient has not had similar chest pain  before.  She has had no significant cough, no fever.  The patient claims  compliance with her current medications which include:   CURRENT MEDICATIONS:  1. Lantus 44 units q.h.s.  2. Protonix 40 mg daily.  3. Hyzaar 50/12.5 one daily.  4. Aspirin 81 mg daily.  5. Ditropan XL 20 mg daily.  6. Metformin 1000 mg p.o. b.i.d.  7. Lipitor 40 mg p.o. daily.   PRIOR SURGICAL HISTORY:  Remote tubal ligation, pilonidal cyst surgery,  tubular adenoma via colonoscopy in 2009.   FAMILY HISTORY:  Positive for hypertension, diabetes, coronary artery  disease, hyperlipidemia.   ALLERGIES:  ACE INHIBITOR cough, DOXYCYCLINE nausea.   SOCIAL HISTORY:  The patient is single, one child.  Smokes a half to one  pack per day.  No  alcohol use.  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   REVIEW OF SYSTEMS:  Otherwise negative.   PHYSICAL EXAMINATION:  VITAL SIGNS:  Blood pressure 152/90, weight 191,  afebrile.  GENERAL:  The patient is alert, slightly anxious appearing, otherwise no  acute distress.  HEENT:  Normal.  NECK:  Supple.  LUNGS:  Clear.  HEART:  Regular rate and rhythm.  Chest wall nontender.  ABDOMEN:  Nontender.  Good bowel sounds.  No rebound.  No guarding.  EXTREMITIES:  Normal pulses intact.  Sensation intact.   SIGNIFICANT LABS:  Chest x-ray normal other than borderline low  aeration.  EKG normal sinus rhythm.  No significant ST-T changes.  Cardiac enzymes first set negative.   IMPRESSION:  1. Chest pain, probable unstable angina.  The patient gives a rather      classic history which of course is concerning.  The patient has      been advised of this.  2. Type 2 diabetes, suboptimal with glucose up on initial met-7.  3. Hypertension.  4.  Hyperlipidemia.  5. Chronic smoker.   PLAN:  Admit for serial cardiac enzymes, _________, IV heparin.  The  patient has chewed one aspirin in the office, nasal cannula oxygen,  cardiology consult.  The patient will likely need catheterization.  Further recommendations is as noted in the chart.      Donna Bernard, M.D.  Electronically Signed     WSL/MEDQ  D:  03/18/2008  T:  03/18/2008  Job:  [id number]

## 2010-06-21 NOTE — Cardiovascular Report (Signed)
NAMEKAWTHAR, ENNEN             ACCOUNT NO.:  [id number]   MEDICAL RECORD NO.:  [id number]          PATIENT TYPE:  INP   LOCATION:  2502                         FACILITY:  [facility]   PHYSICIAN:  Verne Carrow, MDDATE OF BIRTH:  October 30, 1950   DATE OF PROCEDURE:  DATE OF DISCHARGE:                            CARDIAC CATHETERIZATION   PRIMARY CARDIOLOGIST:  Gerrit Friends. Dietrich Pates, MD, [facility]   PROCEDURE PERFORMED:  1. Left heart catheterization  2. Selective coronary angiography.  3. Left ventricular angiogram.  4. Percutaneous coronary intervention with placement 2 XIENCE drug-      eluting stents in the mid right coronary artery.  5. Placement of an Angio-Seal femoral artery closure device.   OPERATOR:  Verne Carrow, MD   INDICATION:  Exertional angina in a 60 year old African American female  with cardiac risk factors that include hypertension, hyperlipidemia,  insulin-dependent diabetes mellitus, tobacco abuse, and obesity.  The  patient was admitted to [facility] and ruled out for a  myocardial infarction with serial cardiac enzymes; however, her symptoms  were worrisome for obstructive coronary artery disease and she was  referred for a left heart catheterization.   DETAILS OF PROCEDURE:  The patient was brought to the inpatient cardiac  catheterization laboratory at [facility] after signing informed  consent.  The right groin was prepped and draped in a sterile fashion.  Lidocaine 1% was used for local anesthesia.  A 5-French sheath was  inserted into the right femoral artery without difficulty.  Standard  diagnostic catheters were used to perform selective coronary angiography  and a left ventricular angiogram.  There was no pressure gradient across  the aortic valve when the pigtail catheter was pulled back across the  aortic valve.   At this point, we elected to proceed to intervention of the severe  stenosis in the mid right coronary artery.   The patient was given 600 mg  of Plavix on the cath table.  An Angiomax bolus was given and a drip was  started.  A Cougar intracoronary wire was passed down the right coronary  artery into the posterolateral branch without difficulty.  I then used a  1.5- x 15-mm Sprinter balloon for predilatation of the tight stenosis.  At this point, a 2.5- x 20-mm apex balloon was used for further  predilatation throughout the mid portion of the vessel.  The vessel was  noted to be mildly calcified; however, the vessel yielded well to  initial balloon inflation.  I had some difficulty delivering a stent  down this vessel.  A BMW wire was inserted down the right coronary  artery for additional support.  A 2.75- x 28-mm XIENCE drug-eluting  stent was then passed down to the mid portion of the right coronary  artery and deployed.  I then deployed a second 2.75- x 15-mm science  XIENCE drug-eluting stent in an overlapping fashion in the mid right  coronary artery.  A 3.0- x 20-mm noncompliant balloon was used for post  dilatation of the stented segment.  The stenosis prior to the  intervention was 99% and following the intervention was  0%.  The patient  tolerated the procedure well.  However, I did jail a small RV marginal  branch.  There was poor flow into this vessel with staining of dye at  the end of the procedure.  There was good distal runoff into the distal  right coronary artery, posterior descending artery, and posterolateral  segment.  There was no evidence of a distal dissection in the vessel.  An Angio-Seal femoral artery closure device was placed in the right  femoral artery, and the patient was taken to the holding area in stable  condition.   ANGIOGRAPHIC FINDINGS:  1. The left main coronary artery is a large-caliber vessel that has a      distal 40-50% stenosis.  This did not appear to be flow limiting.  2. The left anterior descending artery has a 30% proximal stenosis and      diffuse  luminal irregularities through the mid portion of the      vessel.  The first diagonal is a small vessel that has plaque      disease.  3. There is a moderate-sized ramus intermedius branch that has      nonobstructive plaque.  4. The circumflex artery is a moderate-sized vessel that gives off a      moderate-sized first obtuse marginal branch that has ostial 50%      stenosis and another 50% stenosis in the mid portion of the      marginal branch.  The mid circumflex has serial 40% lesions.  5. The right coronary artery with a large dominant vessel that has      plaque disease in the proximal portion followed by a tight 99%      stenosis in the mid portion of the vessel.  Beyond the tight      stenosis, there are serial 70% lesions in the mid portion of the      vessel.  There is a 50% stenosis in the distal right coronary      artery.  This bifurcates into a moderate-sized posterior descending      artery that has plaque disease and a small posterolateral branch      that has an ostial 60% stenosis.  6. Left ventricular angiogram in the RAO projection shows normal      systolic function with no wall motion abnormalities.  Ejection      fraction is estimated at 55%.   HEMODYNAMIC DATA:  Central aortic pressure 143/70, LV pressure 120/60,  end-diastolic pressure 13.   IMPRESSION:  1. Severe single-vessel coronary artery disease.  2. Left main coronary artery disease with moderate stenosis.  3. Moderate nonobstructive disease in the circumflex coronary artery.  4. Successful percutaneous coronary intervention with placement of 2      drug-eluting stents in an overlapping fashion in the mid right      coronary artery.  5. Normal left ventricular systolic function.   RECOMMENDATIONS:  The patient will be continued on aspirin and Plavix  for at least 1 year.  She will also be continued on beta-blocker, a  statin, and an angiotensin receptor blocker.  We will need to make sure  that  the patient has tight control of her diabetes.  The patient will  also need to agree to complete smoking cessation.       Verne Carrow, MD  Electronically Signed     CM/MEDQ  D:  03/18/2008  T:  03/19/2008  Job:  [id number]   cc:  Gerrit Friends. Dietrich Pates, MD, [facility]

## 2010-06-21 NOTE — Assessment & Plan Note (Signed)
[facility]                       [place] CARDIOLOGY OFFICE NOTE   NAME:Rachel [name]                    MRN:          [id number]  DATE:03/25/2008                            DOB:          Sep 12, 1950    CARDIOLOGIST:  Dr. Dietrich Pates.   PRIMARY CARE PHYSICIAN:  Dr. Donna Bernard.   REASON FOR VISIT:  Posthospitalization follow-up.   HISTORY OF PRESENT ILLNESS:  Rachel [name] is a 60 year old female patient  who was evaluated recently at [facility] with chest discomfort.  She was transferred [facility] for cardiac catheterization.  Her cardiac catheterization revealed severe single-vessel CAD with a 99%  proximal RCA lesion followed by serial 70% lesions and a 50% distal RCA  lesion.  She had a small PL branch with an ostial 60% stenosis.  The  rest of her anatomy demonstrated mild-to-moderate disease.  Specifically  she had a 40-50% distal left main stenosis that did not appear to be  flow limiting.  She had a 30% proximal LAD stenosis, 50% ostial first  obtuse marginal branch stenosis, a 50% mid obtuse marginal branch  stenosis and  40% mid circumflex lesions.  Her LV function was preserved  with an EF of 55%.  She was treated with 2 Xience drug-eluting stents in  an overlapping fashion in the mid RCA.  Overall she notes that her  symptoms have improved.  She has not been all that active, but denies  any further exertional chest pain or shortness of breath.  However, she  has noted some chest discomfort from time to time.  This can occur at  rest.  She notes it  after exertion as well.  It is a left-sided  discomfort that feels like congestion.  She began to note a non-  productive cough this morning.  No fevers, chills.  She feels as though  her left arm his somewhat weak at times as well.  She has noted some  water brash symptoms but denies any increased belching.  She is quite  anxious about her diagnosis and thinks  anxiety is playing a huge role.  She denies syncope or syncope.  She denies orthopnea, PND or pedal  edema.   MEDICATIONS:  Metformin 1 g b.i.d., Glyburide 5 mg 2 tablets b.i.d.,  Plavix 75 mg daily, Metoprolol 25 mg b.i.d., Oxybutynin 10 mg b.i.d.,  Lipitor 80 mg daily, Aspirin 81 mg daily,  Lantus 44 units q.h.s.   PHYSICAL EXAMINATION:  She is a well-nourished, well-developed female in  no distress.  Blood pressure is 112/80, pulse 67, weight 194 pounds.  HEENT:  Normal  NECK:  Without JVD.  CARDIAC:  Normal S1, S2.  Regular rate and rhythm.  LUNGS:  Clear to auscultation bilaterally.  ABDOMEN:  Soft, nontender.  EXTREMITIES:  Without edema.  NEUROLOGIC:  She is alert and oriented x3.  Cranial nerves II-XII are  grossly intact.  VASCULAR EXAM:  Right femoral arteriotomy site without hematoma or  bruit.   Electrocardiogram reveals sinus rhythm, rate of 67, normal axis,  nonspecific ST-T wave changes.   ASSESSMENT AND PLAN:  1. Atypical chest  discomfort in a 60 year old female with a history of      coronary disease, status post recent percutaneous coronary      intervention with 2 Xience drug-eluting stents placed to the mid      right coronary artery March 18, 2008 and mild-to-moderate      nonobstructive disease noted elsewhere.  Of note, she did have a 40-      50% distal left main stenosis that did not appear to be flow      limiting.  Her ejection fraction was well-preserved.  The patient's      chest discomfort is somewhat atypical.  It is not clear what the      etiology of this is at this time.  She notes that overall she feels      much better than she did before she presented to the hospital.      These symptoms occur briefly and at different times.  She has not      had any exertional symptoms.  Her EKG is nonacute.  She is      compliant with her medications.  She does have a history of      gastroesophageal reflux disease.  She has had some symptoms that       are suggestive of this, although I am not convinced that this is      causing all her symptoms.  In any event, I have recommended that      she start on Pepcid 20 mg b.i.d. to help cover for this      possibility.  Also, she is a diabetic.  She certainly is at risk to      have small vessel disease.  I have recommended that she start on      Imdur 30 mg a day to cover for this possibility as well.  I am      going to bring her back in close follow-up in the next 1-2 weeks to      see either me or Dr. Dietrich Pates.  She knows to contact us if her      symptoms should change or worsen.  If they change significantly she      knows to go the emergency room.  She is getting ready to start      cardiac rehab.  I have given her a note to stay at work for another      couple of weeks as well.  As noted above, she is quite anxious      about her diagnosis.  I suspect that this is playing a huge role in      her symptoms as well.  She seems to be perseverating on her      diagnosis and is uncertain what to think about her chest symptoms.  2. Hypertension, well controlled.  3. Diabetes mellitus.  She follows up with Dr. Gerda Diss today.  She had      been on Hyzaar previously, but this was removed in the hospital due      to hypotension.  She can discuss reinitiation of this further with      Dr. Gerda Diss today.  4. Dyslipidemia.  This is followed by Dr. Gerda Diss.  Her goal LDL is      less than or equal to 70.  She continues on high-dose Lipitor.   DISPOSITION:  Follow up with me or Dr. Dietrich Pates in the next 1-2 weeks or  sooner p.r.n.  [name], PA-C  Electronically Signed      Gerrit Friends. Dietrich Pates, MD, [facility]  Electronically Signed   SW/MedQ  DD: 03/25/2008  DT: 03/25/2008  Job #: [id number]   cc:   Lacretia Nicks. Simone Curia, M.D.

## 2010-06-22 MED ORDER — FLUCONAZOLE 200 MG PO TABS: 200.0000 mg | ORAL_TABLET | Freq: Every day | ORAL | Status: AC

## 2010-06-22 NOTE — Telephone Encounter (Signed)
Pt aware.

## 2010-06-22 NOTE — Telephone Encounter (Signed)
Give two more weeks of diflucan, this time 200mg  po daily. RX sent. Call with recurrent symptoms or if does not feel better is couple of days. Hold lipitor while on diflucan.

## 2010-07-13 ENCOUNTER — Telehealth: Payer: Self-pay | Admitting: Internal Medicine

## 2010-07-13 NOTE — Telephone Encounter (Signed)
Pt is on recall for tcs in June 2012

## 2010-07-15 ENCOUNTER — Encounter: Payer: Self-pay | Admitting: Internal Medicine

## 2010-07-29 ENCOUNTER — Other Ambulatory Visit: Payer: Self-pay | Admitting: Gastroenterology

## 2010-08-08 ENCOUNTER — Ambulatory Visit (INDEPENDENT_AMBULATORY_CARE_PROVIDER_SITE_OTHER): Payer: 59 | Admitting: Gastroenterology

## 2010-08-08 ENCOUNTER — Encounter: Payer: Self-pay | Admitting: Gastroenterology

## 2010-08-08 DIAGNOSIS — D126 Benign neoplasm of colon, unspecified: Secondary | ICD-10-CM

## 2010-08-08 DIAGNOSIS — R1310 Dysphagia, unspecified: Secondary | ICD-10-CM

## 2010-08-08 DIAGNOSIS — R748 Abnormal levels of other serum enzymes: Secondary | ICD-10-CM

## 2010-08-08 NOTE — Progress Notes (Signed)
Cc to Dr. Scott Luking 

## 2010-08-08 NOTE — Assessment & Plan Note (Signed)
Recommend LFTs prior to next OV in six months.

## 2010-08-08 NOTE — Assessment & Plan Note (Signed)
Patient was on callback list for 3 year surveillance colonoscopy given history of tubular adenomas. At this time she would like to postpone procedure. Would recommend colonoscopy within the next 6 months to one year. We'll have her come back in 6 months for followup.

## 2010-08-08 NOTE — Progress Notes (Signed)
AGREE. CONSIDER WEIGHT BASED DOSING OF DIF if yeast in esophagus confirmed.

## 2010-08-08 NOTE — Assessment & Plan Note (Signed)
Suspected Candida esophagitis based on his response to Diflucan. However concerned about requirement for multiple rounds of Diflucan at this point. She is a diabetic he tells me her last hemoglobin A1c was 6.5. She does not using any inhalers. If she develops a recurrent odynophagia would recommend EGD at that time to confirm diagnosis. She is in agreement with plan and will call us with any recurrent symptoms.

## 2010-08-08 NOTE — Progress Notes (Signed)
Primary Care Physician:  Lilyan Punt, MD, MD  Primary Gastroenterologist:  Roetta Sessions, MD  Chief Complaint  Patient presents with  . Follow-up    HPI:  Rachel [name] is a 60 y.o. female here for followup of odynophagia. She had EGD back in March of this year which was normal. In April I saw in the office with complaints of new onset odynophagia. She had a CT scan which showed severe esophagitis. After discussion with Dr. Jena Gauss, we decided to empirically treat her for Candida esophagitis. Initially she received 21 day course of Diflucan. She had a good response with complete resolution of odynophagia. However once coming off the medication she have recurrent symptoms after just a couple of days. She has taken a total of 6 weeks worth of Diflucan at this point. Each time she has marked improvement of her odynophagia. She's been off the medication now for 4 days. She was given a one-week supply by Dr. Gerda Diss. Currently she is asymptomatic. Denies heartburn. No other GI issues. She had colonoscopy 3 years ago had tubal adenoma. She was on a three-year followup surveillance plan but at this time wants to postpone procedure.  Current Outpatient Prescriptions  Medication Sig Dispense Refill  . aspirin 81 MG tablet Take 81 mg by mouth daily.        . B-D INS SYRINGE 0.5CC/31GX5/16 31G X 5/16" 0.5 ML MISC Inject 84 Units into the skin at bedtime.      . CRESTOR 20 MG tablet       . glyBURIDE (DIABETA) 5 MG tablet Take 10 mg by mouth 2 (two) times daily with a meal.        . insulin glargine (LANTUS) 100 UNIT/ML injection Inject 84 Units into the skin at bedtime.        . metFORMIN (GLUCOPHAGE) 1000 MG tablet Take 1 tablet by mouth 2 (two) times daily.       . nitroGLYCERIN (NITROSTAT) 0.4 MG SL tablet Place 0.4 mg under the tongue every 5 (five) minutes as needed.        Marland Kitchen oxybutynin (DITROPAN-XL) 10 MG 24 hr tablet Take 10 mg by mouth 2 (two) times daily.        . pantoprazole (PROTONIX) 40 MG  tablet Take 1 tablet (40 mg total) by mouth daily.  31 tablet  5  . PLAVIX 75 MG tablet TAKE 1 TABLET BY MOUTH EVERY DAY  30 tablet  2  .       .      .        Allergies as of 08/08/2010  . (No Known Allergies)    Past Medical History  Diagnosis Date  . Diabetes type 2, controlled 2000  . Hyperlipidemia   . HTN (hypertension)   . GERD (gastroesophageal reflux disease)   . Fatty liver   . Tubular adenoma of colon 07/2007    multiple colonic polyps  . CAD (coronary artery disease) 03/2008    with stenting  . Odynophagia     severe esophagitis on CT 05/2010, treated empirically with Diflucan    Past Surgical History  Procedure Date  . Tubal ligation     bilateral  . Cystectomy     pilonidal  . Colonoscopy w/ polypectomy 07/2007    tubular adenoma  . Angioplasty   . Esophagogastroduodenoscopy 04/2010    normal    Family History  Problem Relation Age of Onset  . Bone cancer Father   . Heart disease Maternal  Grandmother   . Colon cancer Neg Hx   . Liver disease Neg Hx   . Inflammatory bowel disease Neg Hx     History   Social History  . Marital Status: Legally Separated    Spouse Name: N/A    Number of Children: 1  . Years of Education: N/A   Occupational History  . billing specialist [employer]   Social History Main Topics  . Smoking status: Current Everyday Smoker -- 1.0 packs/day for 43 years    Types: Cigarettes  . Smokeless tobacco: Never Used  . Alcohol Use: No  . Drug Use: No  . Sexually Active: Yes -- Female partner(s)    Birth Control/ Protection: Condom     mutual friends   Other Topics Concern  . Not on file   Social History Narrative  . No narrative on file      ROS:  General: Negative for anorexia, weight loss, fever, chills, fatigue, weakness. Eyes: Negative for vision changes.  ENT: Negative for hoarseness, difficulty swallowing , nasal congestion. CV: Negative for chest pain, angina, palpitations, dyspnea on exertion, peripheral  edema.  Respiratory: Negative for dyspnea at rest, dyspnea on exertion, cough, sputum, wheezing.  GI: See history of present illness. GU:  Negative for dysuria, hematuria, urinary incontinence, urinary frequency, nocturnal urination.  MS: Negative for joint pain, low back pain.  Derm: Negative for rash or itching.  Neuro: Negative for weakness, abnormal sensation, seizure, frequent headaches, memory loss, confusion.  Psych: Negative for anxiety, depression, suicidal ideation, hallucinations. She has been upset about injury that occurred on her trip to [place] this weekend, older lady fell off the bus and had injuries to the face. Endo: Negative for unusual weight change.  Heme: Negative for bruising or bleeding. Allergy: Negative for rash or hives.    Physical Examination:  BP 148/98  Pulse 92  Temp(Src) 97.6 F (36.4 C) (Temporal)  Ht 5\' 3"  (1.6 m)  Wt 165 lb (74.844 kg)  BMI 29.23 kg/m2   General: Well-nourished, well-developed in no acute distress.  Head: Normocephalic, atraumatic.   Eyes: Conjunctiva pink, no icterus. Mouth: Oropharyngeal mucosa moist and pink , no lesions erythema or exudate. Neck: Supple without thyromegaly, masses, or lymphadenopathy.  Lungs: Clear to auscultation bilaterally.  Heart: Regular rate and rhythm, no murmurs rubs or gallops.  Abdomen: Bowel sounds are normal, nontender, nondistended, no hepatosplenomegaly or masses, no abdominal bruits or    hernia , no rebound or guarding.   Extremities: No lower extremity edema.  Neuro: Alert and oriented x 4 , grossly normal neurologically.  Skin: Warm and dry, no rash or jaundice.   Psych: Alert and cooperative, normal mood and affect.

## 2010-08-09 ENCOUNTER — Other Ambulatory Visit: Payer: Self-pay | Admitting: Gastroenterology

## 2010-08-09 DIAGNOSIS — R7989 Other specified abnormal findings of blood chemistry: Secondary | ICD-10-CM

## 2010-08-09 DIAGNOSIS — K760 Fatty (change of) liver, not elsewhere classified: Secondary | ICD-10-CM

## 2010-08-09 NOTE — Progress Notes (Signed)
Lab order on file, please nic ov 6 months

## 2010-08-11 ENCOUNTER — Other Ambulatory Visit: Payer: Self-pay | Admitting: *Deleted

## 2010-08-15 MED ORDER — CLOPIDOGREL BISULFATE 75 MG PO TABS: 75.0000 mg | ORAL_TABLET | Freq: Every day | ORAL | Status: DC

## 2010-08-19 ENCOUNTER — Telehealth: Payer: Self-pay | Admitting: Gastroenterology

## 2010-08-19 NOTE — Telephone Encounter (Signed)
Opened in error. Closed for administrative reasons

## 2010-08-30 NOTE — Progress Notes (Signed)
Reminder in epic to have LFTs prior to OV in 6 months

## 2010-10-22 ENCOUNTER — Other Ambulatory Visit: Payer: Self-pay | Admitting: Cardiology

## 2010-12-03 ENCOUNTER — Other Ambulatory Visit: Payer: Self-pay | Admitting: Cardiology

## 2011-02-05 ENCOUNTER — Other Ambulatory Visit: Payer: Self-pay | Admitting: Cardiology

## 2011-02-06 ENCOUNTER — Encounter: Payer: Self-pay | Admitting: Internal Medicine

## 2011-02-06 ENCOUNTER — Telehealth: Payer: Self-pay | Admitting: Internal Medicine

## 2011-02-06 NOTE — Telephone Encounter (Signed)
Pt was mailed letter and lab order in December to have labs done in January.

## 2011-02-06 NOTE — Telephone Encounter (Signed)
Pt is on recall list for January 2013 and needs to have LFTs done prior to OV.

## 2011-02-12 ENCOUNTER — Emergency Department (HOSPITAL_COMMUNITY)
Admission: EM | Admit: 2011-02-12 | Discharge: 2011-02-12 | Disposition: A | Discharge status: Home / Self Care | Payer: 59 | Attending: Emergency Medicine | Admitting: Emergency Medicine

## 2011-02-12 ENCOUNTER — Encounter (HOSPITAL_COMMUNITY): Payer: Self-pay

## 2011-02-12 DIAGNOSIS — R05 Cough: Secondary | ICD-10-CM | POA: Insufficient documentation

## 2011-02-12 DIAGNOSIS — J029 Acute pharyngitis, unspecified: Secondary | ICD-10-CM | POA: Insufficient documentation

## 2011-02-12 DIAGNOSIS — Z7982 Long term (current) use of aspirin: Secondary | ICD-10-CM | POA: Insufficient documentation

## 2011-02-12 DIAGNOSIS — I10 Essential (primary) hypertension: Secondary | ICD-10-CM | POA: Insufficient documentation

## 2011-02-12 DIAGNOSIS — F17200 Nicotine dependence, unspecified, uncomplicated: Secondary | ICD-10-CM | POA: Insufficient documentation

## 2011-02-12 DIAGNOSIS — R599 Enlarged lymph nodes, unspecified: Secondary | ICD-10-CM | POA: Insufficient documentation

## 2011-02-12 DIAGNOSIS — R059 Cough, unspecified: Secondary | ICD-10-CM | POA: Insufficient documentation

## 2011-02-12 DIAGNOSIS — I2510 Atherosclerotic heart disease of native coronary artery without angina pectoris: Secondary | ICD-10-CM | POA: Insufficient documentation

## 2011-02-12 DIAGNOSIS — H9209 Otalgia, unspecified ear: Secondary | ICD-10-CM | POA: Insufficient documentation

## 2011-02-12 DIAGNOSIS — Z79899 Other long term (current) drug therapy: Secondary | ICD-10-CM | POA: Insufficient documentation

## 2011-02-12 DIAGNOSIS — K219 Gastro-esophageal reflux disease without esophagitis: Secondary | ICD-10-CM | POA: Insufficient documentation

## 2011-02-12 DIAGNOSIS — E785 Hyperlipidemia, unspecified: Secondary | ICD-10-CM | POA: Insufficient documentation

## 2011-02-12 DIAGNOSIS — Z794 Long term (current) use of insulin: Secondary | ICD-10-CM | POA: Insufficient documentation

## 2011-02-12 DIAGNOSIS — E119 Type 2 diabetes mellitus without complications: Secondary | ICD-10-CM | POA: Insufficient documentation

## 2011-02-12 MED ORDER — NAPROXEN 500 MG PO TABS: 500.0000 mg | ORAL_TABLET | Freq: Two times a day (BID) | ORAL | Status: AC

## 2011-02-12 NOTE — ED Provider Notes (Signed)
History  This chart was scribed for Benny Lennert, MD by Bennett Scrape. This patient was seen in room APFT23/APFT23 and the patient's care was started at 2:33PM.  CSN: [id number]  Arrival date & time 02/12/11  1143   First MD Initiated Contact with Patient 02/12/11 1431      Chief Complaint  Patient presents with  . Sore Throat  . Otalgia   Patient is a 61 y.o. female presenting with pharyngitis.  Sore Throat This is a new problem. The current episode started more than 2 days ago. The problem occurs constantly. The problem has been gradually worsening. Pertinent negatives include no chest pain, no abdominal pain, no headaches and no shortness of breath. The symptoms are aggravated by swallowing. The symptoms are relieved by nothing.    Rachel [name] is a 61 y.o. female who presents to the Emergency Department complaining of 4 days of gradual onset, gradually worsening, constant sore throat with associated non-productive cough and right otalgia. The pain is worse with swallowing and improved by nothing. She denies sick contacts at home. She reports that she saw dentist for a separate issue and was prescribed penicillin and Vicodin. Pt states that she stopped taking the medications when the first issue resolved and then began taking them again when the sore throat began with no improvement. She denies fever as an associated symptoms. Pt has a h/o diabetes and HTN. Pt is a smoker but denies alcohol use.   Past Medical History  Diagnosis Date  . Diabetes type 2, controlled 2000  . Hyperlipidemia   . HTN (hypertension)   . GERD (gastroesophageal reflux disease)   . Fatty liver   . Tubular adenoma of colon 07/2007    multiple colonic polyps  . CAD (coronary artery disease) 03/2008    with stenting  . Odynophagia     severe esophagitis on CT 05/2010, treated empirically with Diflucan    Past Surgical History  Procedure Date  . Tubal ligation     bilateral  . Cystectomy    pilonidal  . Colonoscopy w/ polypectomy 07/2007    tubular adenoma  . Angioplasty   . Esophagogastroduodenoscopy 04/2010    normal    Family History  Problem Relation Age of Onset  . Bone cancer Father   . Heart disease Maternal Grandmother   . Colon cancer Neg Hx   . Liver disease Neg Hx   . Inflammatory bowel disease Neg Hx     History  Substance Use Topics  . Smoking status: Current Everyday Smoker -- 1.0 packs/day for 43 years    Types: Cigarettes  . Smokeless tobacco: Never Used  . Alcohol Use: No    OB History    Grav Para Term Preterm Abortions TAB SAB Ect Mult Living                  Review of Systems  Constitutional: Negative for chills and fatigue.  HENT: Positive for ear pain (Rigth ear) and sore throat. Negative for congestion, sinus pressure and ear discharge.   Eyes: Negative for discharge.  Respiratory: Negative for cough and shortness of breath.   Cardiovascular: Negative for chest pain.  Gastrointestinal: Negative for abdominal pain and diarrhea.  Genitourinary: Negative for frequency and hematuria.  Musculoskeletal: Negative for back pain.  Skin: Negative for rash.  Neurological: Negative for seizures and headaches.  Hematological: Negative.   Psychiatric/Behavioral: Negative for hallucinations.    Allergies  Review of patient's allergies indicates no known allergies.  Home Medications   Current Outpatient Rx  Name Route Sig Dispense Refill  . ACETAMINOPHEN 500 MG PO TABS Oral Take 1,000 mg by mouth every 6 (six) hours as needed. For pain     . ASPIRIN EC 81 MG PO TBEC Oral Take 81 mg by mouth daily.      Marland Kitchen CLOPIDOGREL BISULFATE 75 MG PO TABS  TAKE 1 TABLET (75 MG TOTAL) BY MOUTH DAILY. 30 tablet 0    Pt. Is overdue for OV  . CRESTOR 20 MG PO TABS Oral Take 20 mg by mouth daily.     . GLYBURIDE 5 MG PO TABS Oral Take 10 mg by mouth 2 (two) times daily with a meal.      . INSULIN GLARGINE 100 UNIT/ML [place] SOLN Subcutaneous Inject 94 Units into  the skin at bedtime.     Marland Kitchen METFORMIN HCL 1000 MG PO TABS Oral Take 1 tablet by mouth 2 (two) times daily.     . OXYBUTYNIN CHLORIDE ER 10 MG PO TB24 Oral Take 10 mg by mouth 2 (two) times daily.      Marland Kitchen PANTOPRAZOLE SODIUM 40 MG PO TBEC Oral Take 1 tablet (40 mg total) by mouth daily. 31 tablet 5  . BD INSULIN SYR ULTRAFINE II 31G X 5/16" 0.5 ML MISC Subcutaneous Inject 84 Units into the skin at bedtime.    Marland Kitchen NAPROXEN 500 MG PO TABS Oral Take 1 tablet (500 mg total) by mouth 2 (two) times daily. 14 tablet 0  . NITROGLYCERIN 0.4 MG SL SUBL Sublingual Place 0.4 mg under the tongue every 5 (five) minutes as needed. For chest pains      Triage Vitals: BP 154/98  Pulse 105  Temp(Src) 98.4 F (36.9 C) (Oral)  Resp 20  Ht 5\' 3"  (1.6 m)  Wt 160 lb (72.576 kg)  BMI 28.34 kg/m2  SpO2 97%  Physical Exam  Constitutional: She is oriented to person, place, and time. She appears well-developed.  HENT:  Head: Normocephalic and atraumatic.       Pharynx is inflammed  Eyes: Conjunctivae and EOM are normal. No scleral icterus.  Neck: Neck supple. No thyromegaly present.  Cardiovascular: Normal rate and regular rhythm.  Exam reveals no gallop and no friction rub.   No murmur heard. Pulmonary/Chest: No stridor. She has no wheezes. She has no rales. She exhibits no tenderness.  Abdominal: She exhibits no distension. There is no tenderness. There is no rebound.  Musculoskeletal: Normal range of motion. She exhibits no edema.  Lymphadenopathy:    She has cervical adenopathy (tender swollen lymph nodes on the right side).  Neurological: She is oriented to person, place, and time. Coordination normal.  Skin: No rash noted. No erythema.  Psychiatric: She has a normal mood and affect. Her behavior is normal.    ED Course  Procedures (including critical care time)  DIAGNOSTIC STUDIES: Oxygen Saturation is 97% on room air, adequate by my interpretation.    COORDINATION OF CARE: 2:37PM-All labs reviewed  with patient and discharge plan discussed. Will give pt an antiinflammatory. Advised pt to follow-up with PCP in 3 to 4 days if not better.   Labs Reviewed  RAPID STREP SCREEN   No results found.   1. Pharyngitis       MDM      The chart was scribed for me under my direct supervision.  I personally performed the history, physical, and medical decision making and all procedures in the evaluation of this patient.Marland Kitchen  Benny Lennert, MD 02/12/11 [phone]

## 2011-02-12 NOTE — ED Notes (Signed)
Pt presents with sore throat and right ear pain since Thursday.

## 2011-03-29 ENCOUNTER — Other Ambulatory Visit: Payer: Self-pay | Admitting: Gastroenterology

## 2012-05-02 ENCOUNTER — Other Ambulatory Visit: Payer: Self-pay | Admitting: Family Medicine

## 2012-06-07 ENCOUNTER — Encounter: Payer: Self-pay | Admitting: *Deleted

## 2012-07-03 ENCOUNTER — Other Ambulatory Visit: Payer: Self-pay | Admitting: *Deleted

## 2012-07-03 MED ORDER — OXYBUTYNIN CHLORIDE ER 10 MG PO TB24: ORAL_TABLET | ORAL | Status: DC

## 2012-07-22 DIAGNOSIS — Z0289 Encounter for other administrative examinations: Secondary | ICD-10-CM

## 2012-07-23 ENCOUNTER — Telehealth: Payer: Self-pay | Admitting: Family Medicine

## 2012-07-23 NOTE — Telephone Encounter (Signed)
error 

## 2012-07-23 NOTE — Telephone Encounter (Signed)
Ask pt, who is covering or Dr. Fransico Him? And, why did he stop metformin?

## 2012-07-23 NOTE — Telephone Encounter (Signed)
Patient stated that Dr. Fransico Him stopped her metformin and glyburide due to elevated creatinine and didnt put her on anything else.

## 2012-07-23 NOTE — Telephone Encounter (Signed)
Pt called stating her Blood Sugar levels have been running very high, (E.G. [phone]) fasting at 206. Pt wants to know what you would advise, novolog before eating and lantus at night. Was taken off Metformin by Dr Fransico Him (whom is out of the country right now). Pt states since this was done her numbers have been crazy.

## 2012-07-23 NOTE — Telephone Encounter (Signed)
Per hospital - No one is covering for Dr. Fransico Him his patients with severe issues advised to go to ER.

## 2012-07-23 NOTE — Telephone Encounter (Signed)
Dr. Fransico Him out of office till July 1.  They will send her last office note for you to review

## 2012-09-07 ENCOUNTER — Other Ambulatory Visit: Payer: Self-pay | Admitting: Family Medicine

## 2012-10-09 ENCOUNTER — Telehealth: Payer: Self-pay | Admitting: Family Medicine

## 2012-10-09 MED ORDER — MECLIZINE HCL 25 MG PO TABS: 25.0000 mg | ORAL_TABLET | Freq: Four times a day (QID) | ORAL | Status: DC | PRN

## 2012-10-09 NOTE — Telephone Encounter (Signed)
25 mg numb 30 one q 6 prn

## 2012-10-09 NOTE — Telephone Encounter (Signed)
RX for Antivert sent into [facility]. Patient notified.

## 2012-10-09 NOTE — Telephone Encounter (Signed)
Pt needs refill as soon as possible due to dizziness for her Antivert 25mg  to [facility]

## 2012-10-31 ENCOUNTER — Ambulatory Visit (INDEPENDENT_AMBULATORY_CARE_PROVIDER_SITE_OTHER): Payer: 59 | Admitting: Family Medicine

## 2012-10-31 ENCOUNTER — Encounter: Payer: Self-pay | Admitting: Family Medicine

## 2012-10-31 VITALS — BP 132/82 | Ht 63.5 in | Wt 158.2 lb

## 2012-10-31 DIAGNOSIS — I739 Peripheral vascular disease, unspecified: Secondary | ICD-10-CM

## 2012-10-31 DIAGNOSIS — I2510 Atherosclerotic heart disease of native coronary artery without angina pectoris: Secondary | ICD-10-CM

## 2012-10-31 DIAGNOSIS — E119 Type 2 diabetes mellitus without complications: Secondary | ICD-10-CM

## 2012-10-31 DIAGNOSIS — E785 Hyperlipidemia, unspecified: Secondary | ICD-10-CM

## 2012-10-31 MED ORDER — DICLOFENAC SODIUM 75 MG PO TBEC: 75.0000 mg | DELAYED_RELEASE_TABLET | Freq: Two times a day (BID) | ORAL | Status: DC

## 2012-10-31 NOTE — Progress Notes (Signed)
  Subjective:    Patient ID: Rachel [name], female    DOB: January 20, 1951, 62 y.o.   MRN: [id number]  HPI  Patient arrives for a follow up on her blood pressure and diabetes.   Patient also complains of pain in her left leg due to a blockage. Constant pain. Sharp in nature. In thr hip, radiaiting down to the left side.  After a long walk developed the pain pain primarily in the left hip sharp in nature. No calf pain  No shortness of breath no chest discomfort.  Compliant with blood pressure medicine. Tried watch salt intake.  Patient fall by diabetes specialist but wanted to have it A1c done today. Notes sugars are improving. Results for orders placed in visit on 10/31/12  POCT GLYCOSYLATED HEMOGLOBIN (HGB A1C)      Result Value Range   Hemoglobin A1C 8.1       Review of Systems No low sugar spells no nausea no diaphoresis no abdominal pain. ROS otherwise negative    Objective:   Physical Exam  Alert HEENT normal. Vitals reviewed. Lungs clear. Heart rare rhythm. Hip positive pain with rotation positive lateral hip tenderness to deep palpation distally. Ankles and feet pulses diminished but present no edema sensation slightly diminished on left.      Assessment & Plan:  Impression peripheral arterial disease asymptomatic at this time. #2 left leg pain orthopedic in nature discussed patient relieved to hear not due to #1. #3 type 2 diabetes control improving though not ideal. #4 hypertension good control. #5 hyperlipidemia patient recently checked plan 25 minutes spent most in discussion. Needs no workup for leg pain at this time. Trial Voltaren twice a day with food. Local measures discussed. Long-term exercise encouraged and discussed. Followup is recommended. WSL

## 2012-11-04 DIAGNOSIS — I739 Peripheral vascular disease, unspecified: Secondary | ICD-10-CM | POA: Insufficient documentation

## 2012-11-18 ENCOUNTER — Telehealth: Payer: Self-pay | Admitting: Family Medicine

## 2012-11-18 NOTE — Telephone Encounter (Signed)
Ov this wk 

## 2012-11-18 NOTE — Telephone Encounter (Signed)
Pt states her leg pain that you treated her for on 09/25 is still causing her a great deal of pain. She wants to know if she can get something else for the pain. diclofenac (VOLTAREN) 75 MG EC tablet is what she was prescribed

## 2012-11-18 NOTE — Telephone Encounter (Signed)
Patient was transferred to front desk to schedule appointment.  

## 2012-11-21 ENCOUNTER — Ambulatory Visit (HOSPITAL_COMMUNITY)
Admission: RE | Admit: 2012-11-21 | Discharge: 2012-11-21 | Disposition: A | Discharge status: Home / Self Care | Payer: 59 | Source: Ambulatory Visit | Attending: Family Medicine | Admitting: Family Medicine

## 2012-11-21 ENCOUNTER — Ambulatory Visit (INDEPENDENT_AMBULATORY_CARE_PROVIDER_SITE_OTHER): Payer: 59 | Admitting: Family Medicine

## 2012-11-21 ENCOUNTER — Encounter: Payer: Self-pay | Admitting: Family Medicine

## 2012-11-21 VITALS — BP 142/90 | Ht 63.5 in | Wt 156.4 lb

## 2012-11-21 DIAGNOSIS — M25552 Pain in left hip: Secondary | ICD-10-CM

## 2012-11-21 DIAGNOSIS — M25559 Pain in unspecified hip: Secondary | ICD-10-CM

## 2012-11-21 DIAGNOSIS — E119 Type 2 diabetes mellitus without complications: Secondary | ICD-10-CM

## 2012-11-21 MED ORDER — ETODOLAC 400 MG PO TABS: 400.0000 mg | ORAL_TABLET | Freq: Two times a day (BID) | ORAL | Status: DC

## 2012-11-21 NOTE — Progress Notes (Signed)
  Subjective:    Patient ID: Rachel [name], female    DOB: 1951-01-11, 62 y.o.   MRN: [id number]  HPI Patient is here today for left leg pain. This is a recurrent condition that originally started back in August. She was first seen here on 9/25 about it. History of arterial to a blockage. Constant pain. Sharp in nature in the hip, radiaiting down to the left side.  She states the pain meds that were prescribed to her last visit did not help.    And patient states her sugars improving somewhat. Morning sugars in the lower 100s. Trying to watch her diet. See prior notes. Compliant with medication.  Patient claims compliance with blood pressure medication.  Review of Systems No chest pain no headache no abdominal pain no shortness of breath ROS otherwise negative    Objective:   Physical Exam  Alert mild distress lungs clear heart regular in rhythm. Hip positive significant pain to palpation lateral hip. Decent range of motion. Some pain with rotation. Negative straight leg raise.  Patient was prepped draped injected with 1 cc Depo-Medrol and 2 cc plain Xylocaine.    Assessment & Plan:  Impression lateral trochanteric bursitis discussed #2 diabetes control improved. Plan injection performed local measures discussed diet exercise discussed followup regular appointment switch to [place] 400 twice a day with food. WSL

## 2013-01-14 ENCOUNTER — Other Ambulatory Visit: Payer: Self-pay | Admitting: Family Medicine

## 2013-01-25 ENCOUNTER — Other Ambulatory Visit: Payer: Self-pay | Admitting: Family Medicine

## 2013-01-26 ENCOUNTER — Other Ambulatory Visit: Payer: Self-pay | Admitting: Family Medicine

## 2013-03-20 ENCOUNTER — Other Ambulatory Visit: Payer: Self-pay | Admitting: Family Medicine

## 2013-03-20 ENCOUNTER — Other Ambulatory Visit: Payer: Self-pay | Admitting: *Deleted

## 2013-03-20 MED ORDER — ETODOLAC 400 MG PO TABS: 400.0000 mg | ORAL_TABLET | Freq: Two times a day (BID) | ORAL | Status: DC

## 2013-03-20 NOTE — Telephone Encounter (Signed)
Last seen 11/21/12

## 2013-03-24 DIAGNOSIS — Z0289 Encounter for other administrative examinations: Secondary | ICD-10-CM

## 2013-03-27 ENCOUNTER — Ambulatory Visit: Payer: 59 | Admitting: Family Medicine

## 2013-04-01 ENCOUNTER — Ambulatory Visit: Payer: 59 | Admitting: Family Medicine

## 2013-04-07 ENCOUNTER — Encounter: Payer: Self-pay | Admitting: Family Medicine

## 2013-04-07 ENCOUNTER — Ambulatory Visit (INDEPENDENT_AMBULATORY_CARE_PROVIDER_SITE_OTHER): Payer: 59 | Admitting: Family Medicine

## 2013-04-07 VITALS — BP 100/72 | Ht 63.5 in | Wt 165.0 lb

## 2013-04-07 DIAGNOSIS — I2510 Atherosclerotic heart disease of native coronary artery without angina pectoris: Secondary | ICD-10-CM

## 2013-04-07 DIAGNOSIS — E119 Type 2 diabetes mellitus without complications: Secondary | ICD-10-CM

## 2013-04-07 DIAGNOSIS — I739 Peripheral vascular disease, unspecified: Secondary | ICD-10-CM

## 2013-04-07 DIAGNOSIS — F17200 Nicotine dependence, unspecified, uncomplicated: Secondary | ICD-10-CM

## 2013-04-07 DIAGNOSIS — K219 Gastro-esophageal reflux disease without esophagitis: Secondary | ICD-10-CM

## 2013-04-07 DIAGNOSIS — E1142 Type 2 diabetes mellitus with diabetic polyneuropathy: Secondary | ICD-10-CM

## 2013-04-07 DIAGNOSIS — E785 Hyperlipidemia, unspecified: Secondary | ICD-10-CM

## 2013-04-07 DIAGNOSIS — E1140 Type 2 diabetes mellitus with diabetic neuropathy, unspecified: Secondary | ICD-10-CM

## 2013-04-07 DIAGNOSIS — H8109 Meniere's disease, unspecified ear: Secondary | ICD-10-CM

## 2013-04-07 DIAGNOSIS — E1149 Type 2 diabetes mellitus with other diabetic neurological complication: Secondary | ICD-10-CM

## 2013-04-07 LAB — POCT GLYCOSYLATED HEMOGLOBIN (HGB A1C): Hemoglobin A1C: 7.2

## 2013-04-07 MED ORDER — LEVOTHYROXINE SODIUM 50 MCG PO TABS: 50.0000 ug | ORAL_TABLET | Freq: Every day | ORAL | Status: DC

## 2013-04-07 NOTE — Progress Notes (Signed)
   Subjective:    Patient ID: Rachel [name], female    DOB: 10/05/1950, 63 y.o.   MRN: [id number]  Diabetes She presents for her follow-up diabetic visit. She has type 2 diabetes mellitus. Her disease course has been stable. There are no hypoglycemic associated symptoms. There are no diabetic associated symptoms. There are no hypoglycemic complications. Symptoms are stable. There are no diabetic complications. There are no known risk factors for coronary artery disease. Current diabetic treatment includes insulin injections. She is compliant with treatment all of the time.  Patient states she has no concerns at this time.  Morn numbers high 100's  Results for orders placed in visit on 04/07/13  POCT GLYCOSYLATED HEMOGLOBIN (HGB A1C)      Result Value Ref Range   Hemoglobin A1C 7.2     Exercise not so good. Walking with work  Due to see cardiologist soon history coronary artery disease. No chest pain currently.  Compliant with her lipid medication. On the Crestor. Handles her numbers well. Trying to watch fats in her diet.  On levothyroid for low thyroid. Recently Dr. and IDA increase her dose.  No low sugar spells.  Continues to have hip pain no considerably improved. Please see prior note.  Pt states tha tvertigo strikes intermittently and renders her unable to work. Cannot predict when it occurs. Vertigo causes dizziness and has to miss work , no tinnitus good  stikes unpredictable  hctz helps some, but not always  Domingo Cocking FMLA Advisor, for [place], rec five days of up tpo five times per month Review of Systems No chest pain no back pain no abdominal pain no change in bowel habits no weight gain no weight loss ROS otherwise negative    Objective:   Physical Exam Alert no apparent distress vitals stable. HEENT normal. Lungs clear. Heart regular rate and rhythm. Ankles without edema. Thyroid nonpalpable. Feet sensation diminished. Pulses diminished in the feet. No  significant edema.       Assessment & Plan:  Impression 1 coronary artery disease asymptomatic encouraged to see cardiologist #2 type 2 diabetes A1c improving. Encouraged to followup with diabetes Dr. Cain Sieve questions answered. #3 diabetic neuropathy ongoing challenge multiple questions and concerns discussed. #4 Mnire's disease currently clinically stable but often experiences flare and needs FMLA approval for this. #5 hypothyroidism clinically stable. #6 hyperlipidemia Will get results from old blood work. #7 peripheral arterial disease discussed plan diet exercise discussed. Maintain same medications. Recheck in 6 months. Importance of seen an eye doctor noted. Stop smoking. 35-40 minutes spent most in discussion. WSL

## 2013-04-17 ENCOUNTER — Telehealth: Payer: Self-pay | Admitting: Family Medicine

## 2013-04-17 DIAGNOSIS — I2510 Atherosclerotic heart disease of native coronary artery without angina pectoris: Secondary | ICD-10-CM

## 2013-04-17 NOTE — Telephone Encounter (Signed)
Pt needs referral to [facility] at [facility] per request of [place] Due to Dr Lattie Haw being retired. They need a new referral.  Pt Seen 04/07/13  Dr Richardson Landry requested she see her cardiologist, but she will need the referral this time

## 2013-04-18 NOTE — Telephone Encounter (Signed)
brendal to look at, this, I guess let's do but i den't realize pt needs formal re referral to new card cause rothbart ret

## 2013-04-21 NOTE — Telephone Encounter (Signed)
Pt needs new referral due to not being seen in there office for over 3 years, please initiate referral in system so I may process

## 2013-04-21 NOTE — Telephone Encounter (Signed)
Ok lets 

## 2013-04-22 ENCOUNTER — Encounter: Payer: Self-pay | Admitting: Family Medicine

## 2013-04-22 NOTE — Telephone Encounter (Signed)
Referral initiated in Epic.

## 2013-05-15 ENCOUNTER — Other Ambulatory Visit: Payer: Self-pay | Admitting: Family Medicine

## 2013-05-22 ENCOUNTER — Encounter: Payer: Self-pay | Admitting: Cardiology

## 2013-05-22 ENCOUNTER — Ambulatory Visit (INDEPENDENT_AMBULATORY_CARE_PROVIDER_SITE_OTHER): Payer: 59 | Admitting: Cardiology

## 2013-05-22 VITALS — BP 118/76 | HR 77 | Ht 63.0 in | Wt 165.0 lb

## 2013-05-22 DIAGNOSIS — I2510 Atherosclerotic heart disease of native coronary artery without angina pectoris: Secondary | ICD-10-CM

## 2013-05-22 DIAGNOSIS — F17200 Nicotine dependence, unspecified, uncomplicated: Secondary | ICD-10-CM

## 2013-05-22 DIAGNOSIS — I739 Peripheral vascular disease, unspecified: Secondary | ICD-10-CM

## 2013-05-22 DIAGNOSIS — E785 Hyperlipidemia, unspecified: Secondary | ICD-10-CM

## 2013-05-22 DIAGNOSIS — E119 Type 2 diabetes mellitus without complications: Secondary | ICD-10-CM

## 2013-05-22 NOTE — Addendum Note (Signed)
Addended by: Truett Mainland on: 05/22/2013 03:37 PM   Modules accepted: Orders

## 2013-05-22 NOTE — Assessment & Plan Note (Addendum)
Followed by Dr. Wolfgang Phoenix and Dr. Dorris Fetch.

## 2013-05-22 NOTE — Assessment & Plan Note (Signed)
Details of this are not clear based on record review. She is not reporting any progressive symptoms. Recommend continued statin therapy and aspirin, exercise plan. Can consider followup lower extremity arterial studies in symptoms progress.

## 2013-05-22 NOTE — Patient Instructions (Signed)
Your physician wants you to follow-up in: 1 year You will receive a reminder letter in the mail two months in advance. If you don't receive a letter, please call our office to schedule the follow-up appointment.    Your physician recommends that you continue on your current medications as directed. Please refer to the Current Medication list given to you today.     Thank you for choosing [facility] !  

## 2013-05-22 NOTE — Assessment & Plan Note (Signed)
She continues on Crestor 20 mg daily, followed by Dr. Wolfgang Phoenix.

## 2013-05-22 NOTE — Assessment & Plan Note (Signed)
Symptomatically stable with history of overlapping DES to the RCA in February 2010, patent at angiography in 2011. ECG reviewed and stable. I recommended observation for now, continue medical therapy, and agree with trying to attain a regular exercise plan. We will schedule annual followup and most likely a Cardiolite at that time (5 years out). She can certainly be seen back sooner if symptoms intervene.

## 2013-05-22 NOTE — Progress Notes (Signed)
Clinical Summary Rachel [name] is a 63 y.o.female referred back to the office by Dr. Wolfgang Phoenix. She is a former patient of Dr. Lattie Haw with known CAD, not seen by Dr. Lattie Haw since 2011. She has known CAD as documented below, has been clinically stable since last cardiac catheterization in 2011. She reports no angina symptoms, no nitroglycerin use. 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 She has not been exercising regularly, but did just recently purchased a stationary bicycle to use.  Lab work from April 2014 showed BUN 16, creatinine 1.3, cholesterol 79, triglycerides 118, HDL 24, LDL 31, hemoglobin A1c 7.6.  ECG today shows sinus rhythm with decreased R-wave progression.  Today we discussed her medications, importance of regular exercise, and plan to establish more regular cardiology followup. She will likely need a followup stress test within the next year for surveillance.   Allergies  Allergen Reactions  . Altace [Ramipril] Cough  . Biaxin [Clarithromycin]     Fatigue  . Dyazide [Hydrochlorothiazide W-Triamterene]   . Glucophage [Metformin Hcl]   . Niacin And Related     Current Outpatient Prescriptions  Medication Sig Dispense Refill  . acetaminophen (TYLENOL) 500 MG tablet Take 1,000 mg by mouth every 6 (six) hours as needed. For pain       . aspirin EC 81 MG tablet Take 81 mg by mouth daily.        . B-D INS SYRINGE 0.5CC/31GX5/16 31G X 5/16" 0.5 ML MISC USE AS DIRECTED (WITH LANTUS INSULIN)  100 each  2  . CRESTOR 20 MG tablet TAKE 1 TABLET BY MOUTH AT BEDTIME  30 tablet  5  . diclofenac (VOLTAREN) 75 MG EC tablet Take 1 tablet (75 mg total) by mouth 2 (two) times daily.  28 tablet  1  . etodolac (LODINE) 400 MG tablet Take 1 tablet (400 mg total) by mouth 2 (two) times daily. Take with food  60 tablet  3  . hydrochlorothiazide (MICROZIDE) 12.5 MG capsule Take 12.5 mg by mouth daily.      . insulin glargine (LANTUS) 100 UNIT/ML injection Inject  45 Units into the skin at bedtime.       . Insulin Zinc Human (NOVOLIN L [place]) Inject into the skin. Sliding scale      . INVOKANA 100 MG TABS daily.       Marland Kitchen levothyroxine (SYNTHROID, LEVOTHROID) 50 MCG tablet Take 1 tablet (50 mcg total) by mouth daily.  30 tablet  0  . meclizine (ANTIVERT) 25 MG tablet TAKE 1 TABLET (25 MG TOTAL) BY MOUTH EVERY 6 (SIX) HOURS AS NEEDED FOR DIZZINESS OR NAUSEA.  30 tablet  0  . nitroGLYCERIN (NITROSTAT) 0.4 MG SL tablet Place 0.4 mg under the tongue every 5 (five) minutes as needed. For chest pains      . ONETOUCH VERIO test strip       . oxybutynin (DITROPAN-XL) 10 MG 24 hr tablet TAKE 2 TABLETS EVERY DAY  60 tablet  3  . [DISCONTINUED] oxybutynin (DITROPAN-XL) 10 MG 24 hr tablet Take 10 mg by mouth 2 (two) times daily.         No current facility-administered medications for this visit.    Past Medical History  Diagnosis Date  . Diabetes type 2, controlled 2000  . Hyperlipidemia   . Essential hypertension, benign   . GERD (gastroesophageal reflux disease)   . Fatty liver   . Tubular adenoma of colon 07/2007    Multiple colonic  polyps  . Coronary atherosclerosis of native coronary artery     DES x 2 (overlapping) RCA 03/2008 - patent 2011  . Odynophagia     Severe esophagitis on CT 05/2010, treated empirically with Diflucan  . Microalbuminuria   . Venous stasis   . Obesity   . Reflux   . PAD (peripheral artery disease)     Past Surgical History  Procedure Laterality Date  . Tubal ligation      Bilateral  . Cystectomy      Pilonidal  . Colonoscopy w/ polypectomy  07/2007    Tubular adenoma  . Esophagogastroduodenoscopy  04/2010    Family History  Problem Relation Age of Onset  . Bone cancer Father   . Heart disease Maternal Grandmother   . Colon cancer Neg Hx   . Liver disease Neg Hx   . Inflammatory bowel disease Neg Hx   . Hypertension Mother     Social History Ms. Covel reports that she has been smoking Cigarettes.  She has a 43  pack-year smoking history. She has never used smokeless tobacco. Ms. Lyall reports that she does not drink alcohol.  Review of Systems No palpitations or syncope. She reports chronic left hip and leg discomfort with activities, states he has a history of PAD and also bursitis on that side. No rest leg pain. No poorly healing ulcers. Otherwise negative.  Physical Examination Filed Vitals:   05/22/13 1344  BP: 118/76  Pulse: 77   Filed Weights   05/22/13 1344  Weight: 165 lb (74.844 kg)   Patient appears comfortable at rest. HEENT: Conjunctiva and lids normal, oropharynx clear. Neck: Supple, no elevated JVP or carotid bruits, no thyromegaly. Lungs: Clear to auscultation, nonlabored breathing at rest. Cardiac: Regular rate and rhythm, no S3 or significant systolic murmur, no pericardial rub. Abdomen: Soft, nontender, bowel sounds present, no guarding or rebound. Extremities: No pitting edema, distal pulses 1+. Skin: Warm and dry. Musculoskeletal: No kyphosis. Neuropsychiatric: Alert and oriented x3, affect grossly appropriate.   Problem List and Plan   Coronary atherosclerosis of native coronary artery Symptomatically stable with history of overlapping DES to the RCA in February 2010, patent at angiography in 2011. ECG reviewed and stable. I recommended observation for now, continue medical therapy, and agree with trying to attain a regular exercise plan. We will schedule annual followup and most likely a Cardiolite at that time (5 years out). She can certainly be seen back sooner if symptoms intervene.  HYPERLIPIDEMIA She continues on Crestor 20 mg daily, followed by Dr. Wolfgang Phoenix.  TOBACCO ABUSE Smoking cessation clearly indicated.  DIABETES MELLITUS, TYPE II Followed by Dr. Wolfgang Phoenix and Dr. Dorris Fetch.  Peripheral arterial disease Details of this are not clear based on record review. She is not reporting any progressive symptoms. Recommend continued statin therapy and aspirin,  exercise plan. Can consider followup lower extremity arterial studies in symptoms progress.    Satira Sark, M.D., F.A.C.C.

## 2013-05-22 NOTE — Assessment & Plan Note (Signed)
Smoking cessation clearly indicated.

## 2013-06-21 ENCOUNTER — Other Ambulatory Visit: Payer: Self-pay | Admitting: Family Medicine

## 2013-08-05 ENCOUNTER — Other Ambulatory Visit: Payer: Self-pay | Admitting: Family Medicine

## 2013-08-05 NOTE — Telephone Encounter (Signed)
3 refills apiece, patient needs followup office visit September no later than October

## 2013-09-10 ENCOUNTER — Encounter (INDEPENDENT_AMBULATORY_CARE_PROVIDER_SITE_OTHER): Payer: 59 | Admitting: Ophthalmology

## 2013-09-10 DIAGNOSIS — H43819 Vitreous degeneration, unspecified eye: Secondary | ICD-10-CM

## 2013-09-10 DIAGNOSIS — E11319 Type 2 diabetes mellitus with unspecified diabetic retinopathy without macular edema: Secondary | ICD-10-CM

## 2013-09-10 DIAGNOSIS — E1165 Type 2 diabetes mellitus with hyperglycemia: Secondary | ICD-10-CM

## 2013-09-10 DIAGNOSIS — H2510 Age-related nuclear cataract, unspecified eye: Secondary | ICD-10-CM

## 2013-09-10 DIAGNOSIS — I10 Essential (primary) hypertension: Secondary | ICD-10-CM

## 2013-09-10 DIAGNOSIS — H35039 Hypertensive retinopathy, unspecified eye: Secondary | ICD-10-CM

## 2013-09-10 DIAGNOSIS — E1139 Type 2 diabetes mellitus with other diabetic ophthalmic complication: Secondary | ICD-10-CM

## 2013-10-09 ENCOUNTER — Encounter: Payer: Self-pay | Admitting: Family Medicine

## 2013-10-09 ENCOUNTER — Ambulatory Visit (INDEPENDENT_AMBULATORY_CARE_PROVIDER_SITE_OTHER): Payer: 59 | Admitting: Family Medicine

## 2013-10-09 VITALS — BP 120/70 | Ht 63.5 in | Wt 164.0 lb

## 2013-10-09 DIAGNOSIS — H8109 Meniere's disease, unspecified ear: Secondary | ICD-10-CM

## 2013-10-09 DIAGNOSIS — E785 Hyperlipidemia, unspecified: Secondary | ICD-10-CM

## 2013-10-09 DIAGNOSIS — I10 Essential (primary) hypertension: Secondary | ICD-10-CM

## 2013-10-09 DIAGNOSIS — E1349 Other specified diabetes mellitus with other diabetic neurological complication: Secondary | ICD-10-CM

## 2013-10-09 DIAGNOSIS — I739 Peripheral vascular disease, unspecified: Secondary | ICD-10-CM

## 2013-10-09 DIAGNOSIS — E119 Type 2 diabetes mellitus without complications: Secondary | ICD-10-CM

## 2013-10-09 MED ORDER — BUPROPION HCL ER (SR) 150 MG PO TB12: ORAL_TABLET | ORAL | Status: DC

## 2013-10-09 MED ORDER — OXYBUTYNIN CHLORIDE ER 10 MG PO TB24: ORAL_TABLET | ORAL | Status: DC

## 2013-10-09 MED ORDER — ROSUVASTATIN CALCIUM 20 MG PO TABS: ORAL_TABLET | ORAL | Status: DC

## 2013-10-09 MED ORDER — HYDROCHLOROTHIAZIDE 12.5 MG PO CAPS: 12.5000 mg | ORAL_CAPSULE | Freq: Every day | ORAL | Status: DC

## 2013-10-09 MED ORDER — LEVOTHYROXINE SODIUM 50 MCG PO TABS: 50.0000 ug | ORAL_TABLET | Freq: Every day | ORAL | Status: DC

## 2013-10-09 NOTE — Progress Notes (Signed)
   Subjective:    Patient ID: Rachel [name], female    DOB: 30-Sep-1950, 63 y.o.   MRN: [id number]  Hyperlipidemia This is a chronic problem. The current episode started more than 1 year ago. The problem is controlled. Recent lipid tests were reviewed and are normal. There are no known factors aggravating her hyperlipidemia. Treatments tried: Crestor. The current treatment provides significant improvement of lipids. There are no compliance problems.  There are no known risk factors for coronary artery disease.   Patient has a diabetic specialist for her diabetes management.  Patient states she has no concerns at this time. Needs a form signed for her insurance company (on front of chart).  Regular exercise due to get a pers trainer soon  Walking several times per wk, back and forth to the card, just saw the card in the last few mos  Has retina issue followed by specialist   Bp's good when ck. Patient has history of known coronary artery disease. Claims compliance with blood pressure medication.  Watching salt intake.  No excessive shortness of breath. Realizes she is out of shape.  Fatigue at times.  Admits to a lot of stress. Her husband recently left ear. This has worsened her depression.  Has known history of micro-proteinuria. Realizes this is a sign of kidney damage.  Also gives history of peripheral neuropathy. Tingling and numbness. Worse in the evening.  Dr nida just did b w on the pt., not cked  As far as feet   Review of Systems No headache no chest pain no back pain no abdominal pain no change in bowel habits no blood in stool ROS otherwise negative    Objective:   Physical Exam  Alert no acute distress. HEENT normal. Lungs clear. Heart rare rhythm. Ankles without edema.      Assessment & Plan:  Impression 1 hypertension decent control. #2 hyperlipidemia discussed. We'll get blood work from Dr. Dorris Fetch #3 micro-proteinuria discussed. 4 peripheral neuropathy  discussed. #5 diabetes mellitus. Patient would like to start coming back here. I tried to convince her she needs a specialist. #6 hypothyroidism clinically stable. #7 coronary artery disease plan diet exercise discussed. Compliance discussed. Followup with specialist as recommended. Maintain same medications. We will continue to pressure medicine at same dose. Exercise encourage. Easily 35-40 minutes spent most in discussion.

## 2013-10-13 DIAGNOSIS — I10 Essential (primary) hypertension: Secondary | ICD-10-CM | POA: Insufficient documentation

## 2013-10-14 ENCOUNTER — Telehealth: Payer: Self-pay | Admitting: Family Medicine

## 2013-10-14 NOTE — Telephone Encounter (Signed)
With known heart disease, pt is on stronger crestor to help save her from heart failure. There is no worthwhile generic ins out there, pt will need to disc this anyway with her diab specialist

## 2013-10-14 NOTE — Telephone Encounter (Signed)
Notified patient with known heart disease, pt is on stronger crestor to help save her from heart failure. There is no worthwhile generic ins out there, pt will need to discuss this anyway with her diabetic specialist. Patient verbalized understanding.

## 2013-10-14 NOTE — Telephone Encounter (Signed)
Patient would like to know if her medications can be changed to generic (if they are not already generic) or if we can change them to something less expensive. She is referring to her Crestor and Lantus.   [facility]

## 2013-10-17 ENCOUNTER — Encounter: Payer: Self-pay | Admitting: *Deleted

## 2013-10-17 ENCOUNTER — Telehealth: Payer: Self-pay | Admitting: *Deleted

## 2013-10-28 ENCOUNTER — Encounter: Payer: Self-pay | Admitting: *Deleted

## 2013-10-28 LAB — HEMOGLOBIN A1C: A1c: 7.9

## 2013-11-06 LAB — HM DIABETES EYE EXAM

## 2014-01-09 ENCOUNTER — Telehealth: Payer: Self-pay | Admitting: Family Medicine

## 2014-01-09 MED ORDER — MECLIZINE HCL 25 MG PO TABS: ORAL_TABLET | ORAL | Status: DC

## 2014-01-09 NOTE — Telephone Encounter (Signed)
Ok three ref 

## 2014-01-09 NOTE — Telephone Encounter (Signed)
meclizine (ANTIVERT) 25 MG tablet   Please send to [facility]   Call pt when sent   She is going out of town soon and will need this for her vertigo please

## 2014-01-09 NOTE — Telephone Encounter (Signed)
Left message on voicemail notifying patient that medication has been sent to pharmacy.

## 2014-01-15 ENCOUNTER — Other Ambulatory Visit: Payer: Self-pay | Admitting: *Deleted

## 2014-01-15 ENCOUNTER — Telehealth: Payer: Self-pay | Admitting: Family Medicine

## 2014-01-15 MED ORDER — MECLIZINE HCL 25 MG PO TABS: ORAL_TABLET | ORAL | Status: DC

## 2014-01-15 NOTE — Telephone Encounter (Signed)
See note from 12/4 for her script  It was to be sent to [facility]   Please reroute this to the proper pharmacy  Pt is out of the med now

## 2014-01-15 NOTE — Telephone Encounter (Signed)
Med sent to pharm. Pt notified on voicemail.  

## 2014-01-22 ENCOUNTER — Other Ambulatory Visit: Payer: Self-pay | Admitting: Family Medicine

## 2014-02-03 ENCOUNTER — Encounter: Payer: Self-pay | Admitting: Family Medicine

## 2014-02-03 ENCOUNTER — Ambulatory Visit (INDEPENDENT_AMBULATORY_CARE_PROVIDER_SITE_OTHER): Payer: 59 | Admitting: Family Medicine

## 2014-02-03 VITALS — BP 132/90 | Ht 64.0 in | Wt 155.0 lb

## 2014-02-03 DIAGNOSIS — E119 Type 2 diabetes mellitus without complications: Secondary | ICD-10-CM

## 2014-02-03 DIAGNOSIS — R5383 Other fatigue: Secondary | ICD-10-CM

## 2014-02-03 DIAGNOSIS — E038 Other specified hypothyroidism: Secondary | ICD-10-CM

## 2014-02-03 DIAGNOSIS — E0842 Diabetes mellitus due to underlying condition with diabetic polyneuropathy: Secondary | ICD-10-CM

## 2014-02-03 DIAGNOSIS — I10 Essential (primary) hypertension: Secondary | ICD-10-CM

## 2014-02-03 DIAGNOSIS — Z79899 Other long term (current) drug therapy: Secondary | ICD-10-CM

## 2014-02-03 DIAGNOSIS — E785 Hyperlipidemia, unspecified: Secondary | ICD-10-CM

## 2014-02-03 DIAGNOSIS — E0843 Diabetes mellitus due to underlying condition with diabetic autonomic (poly)neuropathy: Secondary | ICD-10-CM

## 2014-02-03 MED ORDER — INSULIN GLARGINE 100 UNIT/ML ~~LOC~~ SOLN: 50.0000 [IU] | Freq: Every day | SUBCUTANEOUS | Status: DC

## 2014-02-03 MED ORDER — METFORMIN HCL 1000 MG PO TABS: 1000.0000 mg | ORAL_TABLET | Freq: Two times a day (BID) | ORAL | Status: DC

## 2014-02-03 MED ORDER — CANAGLIFLOZIN 100 MG PO TABS: 100.0000 mg | ORAL_TABLET | Freq: Every day | ORAL | Status: DC

## 2014-02-03 MED ORDER — BUPROPION HCL ER (SR) 150 MG PO TB12: ORAL_TABLET | ORAL | Status: DC

## 2014-02-03 MED ORDER — HYDROCHLOROTHIAZIDE 12.5 MG PO CAPS: 12.5000 mg | ORAL_CAPSULE | Freq: Every day | ORAL | Status: DC

## 2014-02-03 MED ORDER — ETODOLAC 400 MG PO TABS: ORAL_TABLET | ORAL | Status: DC

## 2014-02-03 MED ORDER — ROSUVASTATIN CALCIUM 20 MG PO TABS: ORAL_TABLET | ORAL | Status: DC

## 2014-02-03 MED ORDER — LEVOTHYROXINE SODIUM 75 MCG PO TABS: 75.0000 ug | ORAL_TABLET | Freq: Every day | ORAL | Status: DC

## 2014-02-03 NOTE — Progress Notes (Signed)
   Subjective:    Patient ID: Rachel [name], female    DOB: 02/16/1950, 63 y.o.   MRN: [id number]  HPI Patient is here today for a check up.  Patient would like to discuss her meds with you. Wants to know if she needs to be on all of them.  She said Dr. Dorris Fetch put her on Metformin and Invokana, and she would like your opinion on those meds. Patient states she currently he cannot afford to go see the diabetes specialist. She strongly requests to come back here for diabetes management. Next  Compliant with lipid medication. Trying to watch her diet. No obvious.  Compliant with blood pressure medicine. Has cut salt down. Trying to watch her exercise level though not doing the best.  Ongoing stress with work.  Slight diminished energy at times. History of hypothyroidism. No recent assessment of TSH  Per patient, her A1C was 7.0 a couple of weeks ago.     No other concerns.    Review of Systems No headache no chest pain no back pain no abdominal pain no change in bowel habits no blood in stool    Objective:   Physical Exam Alert vital signs stable blood pressure good on repeat HEENT normal lungs clear heart rare rhythm ankles without edema feet pulses somewhat diminished bilateral sensation intact no significant edema       Assessment & Plan:  Impression #1 type 2 diabetes good control. #2 hyperlipidemia status uncertain. #3 hypothyroidism status uncertain. #4 coronary artery disease clinically silent #5 reflux stable #6 microalbuminuria plan appropriate blood work. Diet exercise discussed. Meds refilled. On further assessment will take the patient back over. For diabetes management also. Recheck in several months. WSL

## 2014-02-07 DIAGNOSIS — E039 Hypothyroidism, unspecified: Secondary | ICD-10-CM | POA: Insufficient documentation

## 2014-03-13 ENCOUNTER — Encounter: Payer: Self-pay | Admitting: Family Medicine

## 2014-03-16 ENCOUNTER — Ambulatory Visit (INDEPENDENT_AMBULATORY_CARE_PROVIDER_SITE_OTHER): Payer: 59 | Admitting: Ophthalmology

## 2014-03-16 ENCOUNTER — Ambulatory Visit (INDEPENDENT_AMBULATORY_CARE_PROVIDER_SITE_OTHER): Payer: 59 | Admitting: Family Medicine

## 2014-03-16 ENCOUNTER — Encounter: Payer: Self-pay | Admitting: Family Medicine

## 2014-03-16 VITALS — BP 110/64 | Ht 63.0 in | Wt 150.0 lb

## 2014-03-16 DIAGNOSIS — Z0000 Encounter for general adult medical examination without abnormal findings: Secondary | ICD-10-CM

## 2014-03-16 NOTE — Patient Instructions (Signed)
Calcium plus vit D supplement two tabs daily 600 mg ca plus 400 miu Vit D

## 2014-03-16 NOTE — Progress Notes (Signed)
Subjective:    Patient ID: Rachel [name], female    DOB: 1950/08/30, 64 y.o.   MRN: [id number]  HPI The patient comes in today for a wellness visit.    A review of their health history was completed.  A review of medications was also completed.  Any needed refills; none  Eating habits: health conscious  Falls/  MVA accidents in past few months: none  Regular exercise: none  Specialist pt sees on regular basis: Was seeing Dr. Dorris Fetch for diabetes. Pt states last visit was in dec and a1C was 7.3 per pt. Pt would like to start coming back here for diabetic checks. Numbers overall running better, and numb are doing 86 to 113.  Exercise at home not exercvising, not til april  Preventative health issues were discussed.   Additional concerns: none  Pt had a pap at gyn and she states she will call and schedule her own mammogram. She wants this coded today as a physical for her insurance company.   Pt to sched mammo soon, to have   Has not had shingles vaccine  No fam hx of oseteoporosis  Pt notes muscle loss and strength  Flu vacc past nov pneumococca/  mammo to sched soon  Last colonocsopy 6 yrs ago    Review of Systems  Constitutional: Negative for activity change, appetite change and fatigue.  HENT: Negative for congestion, ear discharge and rhinorrhea.   Eyes: Negative for discharge.  Respiratory: Negative for cough, chest tightness and wheezing.   Cardiovascular: Negative for chest pain.  Gastrointestinal: Negative for vomiting and abdominal pain.  Endocrine:       Under therapy for diabetes see data elsewhere  Genitourinary: Negative for frequency and difficulty urinating.  Musculoskeletal: Negative for neck pain.  Allergic/Immunologic: Negative for environmental allergies and food allergies.  Neurological: Negative for weakness and headaches.  Psychiatric/Behavioral: Negative for behavioral problems and agitation.  All other systems reviewed and are  negative.      Objective:   Physical Exam  Constitutional: She is oriented to person, place, and time. She appears well-developed and well-nourished.  Patient declines breast exam and pelvic exam today states she will get that at her [place] office. Otherwise once her wellness exam here today, adamant about that.  HENT:  Head: Normocephalic.  Right Ear: External ear normal.  Left Ear: External ear normal.  Eyes: Pupils are equal, round, and reactive to light.  Neck: Normal range of motion. No thyromegaly present.  Cardiovascular: Normal rate, regular rhythm, normal heart sounds and intact distal pulses.   No murmur heard. Pulmonary/Chest: Effort normal and breath sounds normal. No respiratory distress. She has no wheezes.  Abdominal: Soft. Bowel sounds are normal. She exhibits no distension and no mass. There is no tenderness.  Musculoskeletal: Normal range of motion. She exhibits no edema or tenderness.  Lymphadenopathy:    She has no cervical adenopathy.  Neurological: She is alert and oriented to person, place, and time. She exhibits normal muscle tone.  Skin: Skin is warm and dry.  Psychiatric: She has a normal mood and affect. Her behavior is normal.  Vitals reviewed.         Assessment & Plan:  Impression wellness exam plan encouraged to schedule colonoscopy this year 7 years out with history of tubular adenoma. Exercise discussed. Diet discussed. Shingles vaccine prescription given. Encouraged to take calcium and vitamin D 2 tablets daily. Yearly flu shots encourage. Patient to get mammogram soon. Follow-up for diabetes visit. Screen  blood work already performed through diabetes management. WSL

## 2014-04-01 ENCOUNTER — Encounter: Payer: Self-pay | Admitting: Gastroenterology

## 2014-04-03 ENCOUNTER — Other Ambulatory Visit: Payer: Self-pay | Admitting: Family Medicine

## 2014-04-19 LAB — LIPID PANEL
CHOL/HDL RATIO: 3.6 ratio
CHOLESTEROL: 118 mg/dL (ref 0–200)
HDL: 33 mg/dL — AB (ref >=46)
LDL CALC: 62 mg/dL (ref 0–99)
TRIGLYCERIDES: 114 mg/dL (ref <150)
VLDL: 23 mg/dL (ref 0–40)

## 2014-04-19 LAB — HEPATIC FUNCTION PANEL
ALBUMIN: 3.8 g/dL (ref 3.5–5.2)
ALK PHOS: 76 U/L (ref 39–117)
ALT: 10 U/L (ref 0–35)
AST: 15 U/L (ref 0–37)
BILIRUBIN DIRECT: 0.1 mg/dL (ref 0.0–0.3)
BILIRUBIN TOTAL: 0.3 mg/dL (ref 0.2–1.2)
Indirect Bilirubin: 0.2 mg/dL (ref 0.2–1.2)
TOTAL PROTEIN: 7 g/dL (ref 6.0–8.3)

## 2014-04-19 LAB — BASIC METABOLIC PANEL
BUN: 8 mg/dL (ref 6–23)
CALCIUM: 9.4 mg/dL (ref 8.4–10.5)
CHLORIDE: 99 meq/L (ref 96–112)
CO2: 27 meq/L (ref 19–32)
Creat: 0.89 mg/dL (ref 0.50–1.10)
Glucose, Bld: 104 mg/dL — ABNORMAL HIGH (ref 70–99)
POTASSIUM: 3.1 meq/L — AB (ref 3.5–5.3)
Sodium: 140 mEq/L (ref 135–145)

## 2014-04-19 LAB — MICROALBUMIN, URINE: MICROALB UR: 1.3 mg/dL (ref <2.0)

## 2014-04-19 LAB — TSH: TSH: 3.037 u[IU]/mL (ref 0.350–4.500)

## 2014-04-20 ENCOUNTER — Ambulatory Visit: Payer: Self-pay | Admitting: Nurse Practitioner

## 2014-04-23 ENCOUNTER — Encounter: Payer: Self-pay | Admitting: Family Medicine

## 2014-04-23 ENCOUNTER — Ambulatory Visit (INDEPENDENT_AMBULATORY_CARE_PROVIDER_SITE_OTHER): Payer: 59 | Admitting: Family Medicine

## 2014-04-23 VITALS — BP 130/82 | Ht 63.0 in | Wt 151.2 lb

## 2014-04-23 DIAGNOSIS — E119 Type 2 diabetes mellitus without complications: Secondary | ICD-10-CM

## 2014-04-23 LAB — POCT GLYCOSYLATED HEMOGLOBIN (HGB A1C): Hemoglobin A1C: 6

## 2014-04-23 NOTE — Progress Notes (Signed)
   Subjective:    Patient ID: Rachel [name], female    DOB: [birth year], 64 y.o.   MRN: [id number]  Diabetes She presents for her follow-up diabetic visit. She has type 2 diabetes mellitus. Risk factors for coronary artery disease include dyslipidemia, diabetes mellitus, hypertension and post-menopausal. Current diabetic treatment includes oral agent (dual therapy) and insulin injections. She is compliant with treatment all of the time. Her weight is stable. She is following a diabetic diet. She has not had a previous visit with a dietitian. She participates in exercise daily (walking). She does not see a podiatrist.Eye exam is not current.   Results for orders placed or performed in visit on 04/23/14  POCT glycosylated hemoglobin (Hb A1C)  Result Value Ref Range   Hemoglobin A1C 6.0    Patient reports compliance with her lipid medication. Trying to watch her diet. No obvious side effects. Next  Patient reports compliance with blood pressure medicine. Has cut down salt intake. Does not miss a dose.  Has known peripheral arterial disease. Primarily left leg. Does not occur and less walks very long distance.  Unfortunately still smoking.   Exercising nearly every day  Review of Systems No headache no chest pain no back pain abdominal pain no change in bowel habits no blood in stool ROS otherwise negative    Objective:   Physical Exam Alert vitals stable. HEENT normal. Lungs clear. Heart regular in rhythm. Ankles without edema.       Assessment & Plan:  Impression #1 type 2 diabetes much improved #2 hypertension good control #3 hyperlipidemia much improved #4 peripheral arterial disease discussed #5 chronic smoking discussed plan follow-up with cardiologist as noted. Maintain same medications. Diet exercise discussed in encourage. Recheck as scheduled WSL

## 2014-05-07 ENCOUNTER — Ambulatory Visit: Payer: Self-pay | Admitting: Gastroenterology

## 2014-05-15 ENCOUNTER — Other Ambulatory Visit: Payer: Self-pay | Admitting: Family Medicine

## 2014-05-27 ENCOUNTER — Ambulatory Visit: Payer: Self-pay | Admitting: Gastroenterology

## 2014-06-02 ENCOUNTER — Ambulatory Visit: Payer: Self-pay | Admitting: Cardiology

## 2014-06-24 ENCOUNTER — Ambulatory Visit (INDEPENDENT_AMBULATORY_CARE_PROVIDER_SITE_OTHER): Payer: 59 | Admitting: Cardiology

## 2014-06-24 ENCOUNTER — Encounter: Payer: Self-pay | Admitting: Cardiology

## 2014-06-24 VITALS — BP 132/70 | HR 76 | Ht 63.0 in | Wt 147.0 lb

## 2014-06-24 DIAGNOSIS — I10 Essential (primary) hypertension: Secondary | ICD-10-CM

## 2014-06-24 DIAGNOSIS — I739 Peripheral vascular disease, unspecified: Secondary | ICD-10-CM | POA: Diagnosis not present

## 2014-06-24 DIAGNOSIS — I2510 Atherosclerotic heart disease of native coronary artery without angina pectoris: Secondary | ICD-10-CM

## 2014-06-24 DIAGNOSIS — E785 Hyperlipidemia, unspecified: Secondary | ICD-10-CM | POA: Diagnosis not present

## 2014-06-24 NOTE — Patient Instructions (Signed)
Your physician wants you to follow-up in: 1 year with Dr. [name].  You will receive a reminder letter in the mail two months in advance. If you don't receive a letter, please call our office to schedule the follow-up appointment.  Your physician recommends that you continue on your current medications as directed. Please refer to the Current Medication list given to you today.  Your physician has requested that you have an ankle brachial index (ABI). During this test an ultrasound and blood pressure cuff are used to evaluate the arteries that supply the arms and legs with blood. Allow thirty minutes for this exam. There are no restrictions or special instructions.  Thank you for choosing [facility]!

## 2014-06-24 NOTE — Progress Notes (Signed)
Cardiology Office Note  Date: 06/24/2014   ID: Rachel [name], DOB 23-Jun-1950, MRN [id number]  PCP: [name], MD  Primary Cardiologist: Rozann Lesches, MD   Chief Complaint  Patient presents with  . Coronary Artery Disease    History of Present Illness: Rachel [name] is a 64 y.o. female last seen in April 2015. She presents for a routine follow-up with history of overlapping DES to the RCA in February 2010, patent at angiography in 2011. She does not report any significant angina symptoms on medical therapy. Blood glucose control has been better based on review of her lab work, medications have also been simplified. She does mention left-sided claudication symptoms that she is able to rest and walk through. She has a previous remote history of abnormal lower extremity arterial studies on the left.  Follow-up ECG today shows sinus rhythm with poor R-wave progression which is old in comparison to tracing from last year.  Blood pressure is reasonably well controlled today, and her LDL is 62 on Crestor.   Past Medical History  Diagnosis Date  . Diabetes type 2, controlled 2000  . Hyperlipidemia   . Essential hypertension, benign   . GERD (gastroesophageal reflux disease)   . Fatty liver   . Tubular adenoma of colon 07/2007    Multiple colonic polyps  . Coronary atherosclerosis of native coronary artery     DES x 2 (overlapping) RCA 03/2008 - patent 2011  . Odynophagia     Severe esophagitis on CT 05/2010, treated empirically with Diflucan  . Microalbuminuria   . Venous stasis   . Obesity   . Reflux   . PAD (peripheral artery disease)     Past Surgical History  Procedure Laterality Date  . Tubal ligation      Bilateral  . Cystectomy      Pilonidal  . Colonoscopy w/ polypectomy  07/2007    Tubular adenoma  . Esophagogastroduodenoscopy  04/2010    RMR: Normal esophagus , Stomach D1 and D2     Current Outpatient Prescriptions  Medication Sig Dispense Refill    . acetaminophen (TYLENOL) 500 MG tablet Take 1,000 mg by mouth every 6 (six) hours as needed. For pain     . aspirin EC 81 MG tablet Take 81 mg by mouth daily.      . B-D INS SYRINGE 0.5CC/31GX5/16 31G X 5/16" 0.5 ML MISC USE AS DIRECTED (WITH LANTUS INSULIN) 100 each 2  . buPROPion (WELLBUTRIN SR) 150 MG 12 hr tablet Take 1 tablet q am x 3 days then 1 tablet po BID 60 tablet 5  . CRESTOR 20 MG tablet Take 1 tablet by mouth  every night at bedtime 90 tablet 1  . etodolac (LODINE) 400 MG tablet TAKE 1 TABLET (400 MG TOTAL) BY MOUTH 2 (TWO) TIMES DAILY. TAKE WITH FOOD 60 tablet 2  . hydrochlorothiazide (MICROZIDE) 12.5 MG capsule Take 1 capsule by mouth  daily 90 capsule 1  . insulin glargine (LANTUS) 100 UNIT/ML injection Inject 0.5 mLs (50 Units total) into the skin at bedtime. 10 mL 2  . INVOKANA 100 MG TABS tablet Take 1 tablet by mouth  daily 90 tablet 1  . LANTUS SOLOSTAR 100 UNIT/ML Solostar Pen Inject 0.5 mLs (50 Units  total) into the skin at  bedtime. 30 mL 5  . levothyroxine (SYNTHROID, LEVOTHROID) 75 MCG tablet Take 1 tablet by mouth  daily before breakfast 90 tablet 1  . meclizine (ANTIVERT) 25 MG tablet TAKE  1 TABLET (25 MG TOTAL) BY MOUTH EVERY 6 (SIX) HOURS AS NEEDED FOR DIZZINESS OR NAUSEA. 30 tablet 3  . metFORMIN (GLUCOPHAGE) 1000 MG tablet Take 1 tablet (1,000 mg total) by mouth 2 (two) times daily with a meal. 60 tablet 5  . nitroGLYCERIN (NITROSTAT) 0.4 MG SL tablet Place 0.4 mg under the tongue every 5 (five) minutes as needed. For chest pains    . ONETOUCH VERIO test strip     . oxybutynin (DITROPAN-XL) 10 MG 24 hr tablet Take 2 tablets by mouth  every day 180 tablet 0   No current facility-administered medications for this visit.    Allergies:  Altace; Biaxin; and Niacin and related   Social History: The patient  reports that she has been smoking Cigarettes.  She has a 43 pack-year smoking history. She has never used smokeless tobacco. She reports that she does not  drink alcohol or use illicit drugs.   ROS:  Please see the history of present illness. Otherwise, complete review of systems is positive for none.  All other systems are reviewed and negative.   Physical Exam: VS:  BP 132/70 mmHg  Pulse 76  Ht 5\' 3"  (1.6 m)  Wt 147 lb (66.679 kg)  BMI 26.05 kg/m2  SpO2 95%, BMI Body mass index is 26.05 kg/(m^2).  Wt Readings from Last 3 Encounters:  06/24/14 147 lb (66.679 kg)  04/23/14 151 lb 3.2 oz (68.584 kg)  03/16/14 150 lb (68.04 kg)     Patient appears comfortable at rest. HEENT: Conjunctiva and lids normal, oropharynx clear. Neck: Supple, no elevated JVP or carotid bruits, no thyromegaly. Lungs: Clear to auscultation, nonlabored breathing at rest. Cardiac: Regular rate and rhythm, no S3 or significant systolic murmur, no pericardial rub. Abdomen: Soft, nontender, bowel sounds present, no guarding or rebound. Extremities: No pitting edema, diminished DP on the left. Skin: Warm and dry. Musculoskeletal: No kyphosis. Neuropsychiatric: Alert and oriented x3, affect grossly appropriate.   ECG: ECG is ordered today, showing sinus rhythm with poor progression which is old.  Recent Labwork: 04/18/2014: ALT 10; AST 15; BUN 8; Creatinine 0.89; Potassium 3.1*; Sodium 140; TSH 3.037     Component Value Date/Time   CHOL 118 04/18/2014 0833   TRIG 114 04/18/2014 0833   HDL 33* 04/18/2014 0833   CHOLHDL 3.6 04/18/2014 0833   VLDL 23 04/18/2014 0833   LDLCALC 62 04/18/2014 0833    ASSESSMENT AND PLAN:  1. Symptomatic stable CAD status post DES to the RCA in 2010, patent at angiography in 2011. She reports no angina whatsoever on good medical regimen, has had good blood pressure and LDL control recently, also improving glucose numbers. For now we will continue observation and defer follow-up stress testing.  2. Hyperlipidemia, recent LDL 62 on Crestor.  3. Essential hypertension, blood pressure is reasonably well controlled.  4. Left-sided  claudication symptoms with decreased DP. Lower extremity arterial studies with ABIs to be obtained.  Current medicines were reviewed at length with the patient today.   Orders Placed This Encounter  Procedures  . US Arterial Seg Multiple    Disposition: FU with me in 1 year.   Signed, Satira Sark, MD, [facility] 06/24/2014 4:13 PM    [place] [facility] [address] [place], [place] [address] Phone: [phone]; Fax: [phone]

## 2014-06-26 ENCOUNTER — Ambulatory Visit (HOSPITAL_COMMUNITY)
Admission: RE | Admit: 2014-06-26 | Discharge: 2014-06-26 | Disposition: A | Discharge status: Home / Self Care | Payer: 59 | Source: Ambulatory Visit | Attending: Cardiology | Admitting: Cardiology

## 2014-06-26 ENCOUNTER — Other Ambulatory Visit: Payer: Self-pay | Admitting: Family Medicine

## 2014-06-26 DIAGNOSIS — E119 Type 2 diabetes mellitus without complications: Secondary | ICD-10-CM | POA: Insufficient documentation

## 2014-06-26 DIAGNOSIS — I739 Peripheral vascular disease, unspecified: Secondary | ICD-10-CM | POA: Insufficient documentation

## 2014-06-26 DIAGNOSIS — Z794 Long term (current) use of insulin: Secondary | ICD-10-CM | POA: Diagnosis not present

## 2014-06-26 DIAGNOSIS — Z720 Tobacco use: Secondary | ICD-10-CM | POA: Insufficient documentation

## 2014-06-26 DIAGNOSIS — Z1231 Encounter for screening mammogram for malignant neoplasm of breast: Secondary | ICD-10-CM

## 2014-06-29 NOTE — Addendum Note (Signed)
Addended by: Levonne Hubert on: 06/29/2014 07:34 AM   Modules accepted: Orders

## 2014-06-30 ENCOUNTER — Telehealth: Payer: Self-pay

## 2014-06-30 ENCOUNTER — Other Ambulatory Visit: Payer: Self-pay

## 2014-06-30 DIAGNOSIS — I739 Peripheral vascular disease, unspecified: Secondary | ICD-10-CM

## 2014-06-30 NOTE — Telephone Encounter (Signed)
LMTCB, needs referral to [place] office to see Dr Gwenlyn Found or Dr Fletcher Anon for claudication      Result Notes     Notes Recorded by Satira Sark, MD on 06/30/2014 at 9:08 AM Reviewed report. Study indicates progressive peripheral arterial disease, left worse than right which would be consistent with her claudication symptoms. She needs to have a formal vascular evaluation through our practice in [place]. Please arrange.     I spoke with patient and gave her results,she is awaiting Alphonsus Sias to schedule her apt with Dr.Berry or Fletcher Anon

## 2014-07-27 ENCOUNTER — Ambulatory Visit (HOSPITAL_COMMUNITY): Payer: 59

## 2014-08-12 ENCOUNTER — Ambulatory Visit (INDEPENDENT_AMBULATORY_CARE_PROVIDER_SITE_OTHER): Payer: 59 | Admitting: Cardiovascular Disease

## 2014-08-12 ENCOUNTER — Encounter: Payer: Self-pay | Admitting: Cardiovascular Disease

## 2014-08-12 VITALS — BP 160/76 | HR 104 | Ht 63.0 in | Wt 150.0 lb

## 2014-08-12 DIAGNOSIS — I739 Peripheral vascular disease, unspecified: Secondary | ICD-10-CM

## 2014-08-12 NOTE — Progress Notes (Signed)
08/12/2014 Rachel [name]   Jan 20, 1951  [id number]  Primary Physician [name], MD Primary Cardiologist: [name] MD Renae Gloss   HPI:  Rachel [name] is a delightful 64 year old mildly overweight divorced African-American female mother of one, grandmother of one grandchild who is referred by Dr. [name] for lifestyle limiting claudication. Her primary care physician is Dr. [name].she has a history of tobacco abuse smoking one pack per day for last 45 years, treated hypertension, diabetes and hyperlipidemia. She does have ischemic heart disease status post RCA intervention February 2010 with overlapping drug eluting stents in her RCA. She had repeat cath following year revealing these to be widely patent. She complains of right thalamic claudication left greater than right elbow last 2 years. Segmental pressures performed 06/26/14 revealed a right ABI of 0.89 and a left of .72 with inflow disease suggesting aortoiliac disease.   Current Outpatient Prescriptions  Medication Sig Dispense Refill  . acetaminophen (TYLENOL) 500 MG tablet Take 1,000 mg by mouth every 6 (six) hours as needed. For pain     . aspirin EC 81 MG tablet Take 81 mg by mouth daily.      . B-D INS SYRINGE 0.5CC/31GX5/16 31G X 5/16" 0.5 ML MISC USE AS DIRECTED (WITH LANTUS INSULIN) 100 each 2  . buPROPion (WELLBUTRIN SR) 150 MG 12 hr tablet Take 1 tablet q am x 3 days then 1 tablet po BID 60 tablet 5  . CRESTOR 20 MG tablet Take 1 tablet by mouth  every night at bedtime 90 tablet 1  . etodolac (LODINE) 400 MG tablet TAKE 1 TABLET (400 MG TOTAL) BY MOUTH 2 (TWO) TIMES DAILY. TAKE WITH FOOD 60 tablet 2  . hydrochlorothiazide (MICROZIDE) 12.5 MG capsule Take 1 capsule by mouth  daily 90 capsule 1  . INVOKANA 100 MG TABS tablet Take 1 tablet by mouth  daily 90 tablet 1  . LANTUS SOLOSTAR 100 UNIT/ML Solostar Pen Inject 0.5 mLs (50 Units  total) into the skin at  bedtime. 30 mL 5  . levothyroxine  (SYNTHROID, LEVOTHROID) 75 MCG tablet Take 1 tablet by mouth  daily before breakfast 90 tablet 1  . meclizine (ANTIVERT) 25 MG tablet TAKE 1 TABLET (25 MG TOTAL) BY MOUTH EVERY 6 (SIX) HOURS AS NEEDED FOR DIZZINESS OR NAUSEA. 30 tablet 3  . metFORMIN (GLUCOPHAGE) 1000 MG tablet Take 1 tablet (1,000 mg total) by mouth 2 (two) times daily with a meal. 60 tablet 5  . nitroGLYCERIN (NITROSTAT) 0.4 MG SL tablet Place 0.4 mg under the tongue every 5 (five) minutes as needed. For chest pains    . ONETOUCH VERIO test strip     . oxybutynin (DITROPAN-XL) 10 MG 24 hr tablet Take 2 tablets by mouth  every day 180 tablet 0   No current facility-administered medications for this visit.    Allergies  Allergen Reactions  . Altace [Ramipril] Cough  . Biaxin [Clarithromycin]     Fatigue  . Niacin And Related     History   Social History  . Marital Status: Divorced    Spouse Name: N/A  . Number of Children: 1  . Years of Education: N/A   Occupational History  . Billing specialist [employer]   Social History Main Topics  . Smoking status: Current Every Day Smoker -- 1.00 packs/day for 43 years    Types: Cigarettes  . Smokeless tobacco: Never Used  . Alcohol Use: No  . Drug Use: No  .  Sexual Activity:    Partners: Male    Birth Control/ Protection: Condom     Comment: mutual friends   Other Topics Concern  . Not on file   Social History Narrative     Review of Systems: General: negative for chills, fever, night sweats or weight changes.  Cardiovascular: negative for chest pain, dyspnea on exertion, edema, orthopnea, palpitations, paroxysmal nocturnal dyspnea or shortness of breath Dermatological: negative for rash Respiratory: negative for cough or wheezing Urologic: negative for hematuria Abdominal: negative for nausea, vomiting, diarrhea, bright red blood per rectum, melena, or hematemesis Neurologic: negative for visual changes, syncope, or dizziness All other systems reviewed and  are otherwise negative except as noted above.    Blood pressure 160/76, pulse 104, height 5\' 3"  (1.6 m), weight 150 lb (68.04 kg).  General appearance: alert and no distress Neck: no adenopathy, no JVD, supple, symmetrical, trachea midline, thyroid not enlarged, symmetric, no tenderness/mass/nodules and soft bilateral carotid bruits Lungs: clear to auscultation bilaterally Heart: regular rate and rhythm, S1, S2 normal, no murmur, click, rub or gallop Extremities: extremities normal, atraumatic, no cyanosis or edema and absent pedal pulses  EKG not performed today  ASSESSMENT AND PLAN:   Peripheral arterial disease History of peripheral arterial disease with recent segmental pressures performed 06/26/14 revealing a right ABI of 0.89, a left ABI of 0.72.. The pressure waveform suggested inflow/iliac disease. She has lifestyle limiting claudication. I'm going to get formal lower extremity arterial Doppler studies arranged for her to undergo angiography and potential intervention.      [name] MD [place], [facility] 08/12/2014 4:38 PM

## 2014-08-12 NOTE — Patient Instructions (Signed)
Medication Instructions:   Continue your current medications.   Labwork:  N/A  Testing/Procedures:  1. carotid duplex- This test is an ultrasound of the carotid arteries in your neck. It looks at blood flow through these arteries that supply the brain with blood. Allow one hour for this exam. There are no restrictions or special instructions.  2. lower extremity arterial doppler- During this test, ultrasound is used to evaluate arterial blood flow in the legs. Allow approximately one hour for this exam.     Follow-Up:  Contact me when you are ready to proceed with the lower extremity angiogram.  Curt Bears [phone])  Any Other Special Instructions Will Be Listed Below (If Applicable).

## 2014-08-12 NOTE — Assessment & Plan Note (Signed)
History of peripheral arterial disease with recent segmental pressures performed 06/26/14 revealing a right ABI of 0.89, a left ABI of 0.72.. The pressure waveform suggested inflow/iliac disease. She has lifestyle limiting claudication. I'm going to get formal lower extremity arterial Doppler studies arranged for her to undergo angiography and potential intervention.

## 2014-08-13 ENCOUNTER — Other Ambulatory Visit: Payer: Self-pay | Admitting: Cardiovascular Disease

## 2014-08-13 DIAGNOSIS — I739 Peripheral vascular disease, unspecified: Secondary | ICD-10-CM

## 2014-08-14 ENCOUNTER — Other Ambulatory Visit: Payer: Self-pay | Admitting: Family Medicine

## 2014-08-18 ENCOUNTER — Telehealth: Payer: Self-pay | Admitting: Family Medicine

## 2014-08-18 DIAGNOSIS — I10 Essential (primary) hypertension: Secondary | ICD-10-CM

## 2014-08-18 DIAGNOSIS — E119 Type 2 diabetes mellitus without complications: Secondary | ICD-10-CM

## 2014-08-18 DIAGNOSIS — Z79899 Other long term (current) drug therapy: Secondary | ICD-10-CM

## 2014-08-18 DIAGNOSIS — E785 Hyperlipidemia, unspecified: Secondary | ICD-10-CM

## 2014-08-18 NOTE — Telephone Encounter (Signed)
Patient needs order for routine BW.

## 2014-08-18 NOTE — Telephone Encounter (Signed)
Met 7 lip liv 

## 2014-08-18 NOTE — Telephone Encounter (Signed)
Lipid,liver, bmp, tsh, microalb urine, a1c on 04/18/14

## 2014-08-19 NOTE — Telephone Encounter (Signed)
bw orders ready. [place]

## 2014-08-24 NOTE — Telephone Encounter (Signed)
Patient notified

## 2014-08-26 ENCOUNTER — Encounter: Payer: Self-pay | Admitting: Family Medicine

## 2014-08-26 ENCOUNTER — Ambulatory Visit (INDEPENDENT_AMBULATORY_CARE_PROVIDER_SITE_OTHER): Payer: 59 | Admitting: Family Medicine

## 2014-08-26 VITALS — BP 122/80 | Temp 98.5°F | Ht 63.75 in | Wt 143.0 lb

## 2014-08-26 DIAGNOSIS — Z79899 Other long term (current) drug therapy: Secondary | ICD-10-CM

## 2014-08-26 DIAGNOSIS — R197 Diarrhea, unspecified: Secondary | ICD-10-CM | POA: Diagnosis not present

## 2014-08-26 DIAGNOSIS — I10 Essential (primary) hypertension: Secondary | ICD-10-CM | POA: Diagnosis not present

## 2014-08-26 DIAGNOSIS — E0843 Diabetes mellitus due to underlying condition with diabetic autonomic (poly)neuropathy: Secondary | ICD-10-CM | POA: Diagnosis not present

## 2014-08-26 DIAGNOSIS — R531 Weakness: Secondary | ICD-10-CM

## 2014-08-26 DIAGNOSIS — E039 Hypothyroidism, unspecified: Secondary | ICD-10-CM

## 2014-08-26 DIAGNOSIS — E785 Hyperlipidemia, unspecified: Secondary | ICD-10-CM

## 2014-08-26 LAB — POCT GLYCOSYLATED HEMOGLOBIN (HGB A1C): Hemoglobin A1C: 5.6

## 2014-08-26 MED ORDER — METFORMIN HCL 500 MG PO TABS: 500.0000 mg | ORAL_TABLET | Freq: Two times a day (BID) | ORAL | Status: DC

## 2014-08-26 NOTE — Progress Notes (Signed)
   Subjective:    Patient ID: Rachel [name], female    DOB: [birth year], 64 y.o.   MRN: [id number]  Diarrhea  This is a new problem. Episode onset: 3 weeks ago. Episode frequency: 3 -4 times per day. Associated symptoms include bloating. She has tried nothing for the symptoms.   patient relates a diarrhea been or cyst in intermittent for several months worse over the past few weeks denies any travel denies any new foods or medicines denies camping travel knee water etc. no fevers no bloody stools. Patient has diabetes blood sugars been very good but at times on the low end. Weakness and pain in left arm for about 2 weeks. Denies numbness or tingling relates pain shoulder and some pain radiating into the bicep hurts her so with activity   Review of Systems  Gastrointestinal: Positive for diarrhea and bloating.   denies bloody stools denies vomiting denies severe abdominal pains states has frequent loose stools. Denies cough wheezing shortness of breath     Objective:   Physical Exam Neck no masses eardrums normal throat normal lungs clear hearts regular pulse normal abdomen soft no guarding rebound extremities no edema left arm subjected discomfort in the left rotator cuff with slight decreased range of motion strength in arms overall fairly good reflexes good hand strength finger strength normal  Greater than 25 minutes spent patient discussing multiple issues This patient is unable to work currently we will give her a work excuse through the 27th she is unable to work because of the tendinitis in her shoulder as well as frequent diarrhea.    Assessment & Plan:  1. Diabetes mellitus due to underlying condition with diabetic autonomic neuropathy Diabetes under good control but it is possible that this is too strict of control and that she is having side effects with the metformin we will reduce the metformin 500 mg twice a day follow-up in 3 months time - POCT glycosylated hemoglobin (Hb  A1C)  2. Hyperlipidemia LDL goal <70 Continue medication check lipid profile await the results - Lipid panel  3. Essential hypertension, benign Blood pressure under good control check metabolic 7 - Basic metabolic panel  4. Hypothyroidism, unspecified hypothyroidism type Has hypothyroidism check TSH continue current medication - TSH  5. High risk medication use High risk medication check liver profile - Hepatic function panel  6. Weakness Weakness secondary to diarrhea also check magnesium patient has hado anemia as well - Magnesium  7. Diarrhea Significant diarrhea issues probably related to metformin reduce it to 500 mg twice a day if not significantly better by next week then the next step would be doing stool testing and workup for celiac disease  Finally patient with mild rotator cuff tendinitis may use anti-inflammatory twice a day for up to 10-14 days do range of motion exercises if not better over the next 2 weeks referral to orthopedics

## 2014-08-26 NOTE — Patient Instructions (Signed)
May use Lodine (etodolac)  one 2 times a day for the arm over the next 10 days Do gentle range of motion exercises

## 2014-08-28 ENCOUNTER — Encounter (HOSPITAL_COMMUNITY): Payer: Self-pay | Admitting: *Deleted

## 2014-08-28 ENCOUNTER — Inpatient Hospital Stay (HOSPITAL_COMMUNITY)
Admission: EM | Admit: 2014-08-28 | Discharge: 2014-08-30 | DRG: 640 | Disposition: A | Discharge status: Home / Self Care | Payer: 59 | Attending: Family Medicine | Admitting: Family Medicine

## 2014-08-28 DIAGNOSIS — T383X5A Adverse effect of insulin and oral hypoglycemic [antidiabetic] drugs, initial encounter: Secondary | ICD-10-CM | POA: Diagnosis present

## 2014-08-28 DIAGNOSIS — Z955 Presence of coronary angioplasty implant and graft: Secondary | ICD-10-CM | POA: Diagnosis not present

## 2014-08-28 DIAGNOSIS — R197 Diarrhea, unspecified: Secondary | ICD-10-CM | POA: Diagnosis not present

## 2014-08-28 DIAGNOSIS — Z794 Long term (current) use of insulin: Secondary | ICD-10-CM | POA: Diagnosis not present

## 2014-08-28 DIAGNOSIS — E1142 Type 2 diabetes mellitus with diabetic polyneuropathy: Secondary | ICD-10-CM

## 2014-08-28 DIAGNOSIS — I10 Essential (primary) hypertension: Secondary | ICD-10-CM | POA: Diagnosis present

## 2014-08-28 DIAGNOSIS — K760 Fatty (change of) liver, not elsewhere classified: Secondary | ICD-10-CM | POA: Diagnosis present

## 2014-08-28 DIAGNOSIS — E785 Hyperlipidemia, unspecified: Secondary | ICD-10-CM | POA: Diagnosis present

## 2014-08-28 DIAGNOSIS — K219 Gastro-esophageal reflux disease without esophagitis: Secondary | ICD-10-CM | POA: Diagnosis present

## 2014-08-28 DIAGNOSIS — E1310 Other specified diabetes mellitus with ketoacidosis without coma: Secondary | ICD-10-CM | POA: Diagnosis present

## 2014-08-28 DIAGNOSIS — E860 Dehydration: Secondary | ICD-10-CM | POA: Diagnosis present

## 2014-08-28 DIAGNOSIS — E0843 Diabetes mellitus due to underlying condition with diabetic autonomic (poly)neuropathy: Secondary | ICD-10-CM

## 2014-08-28 DIAGNOSIS — I2510 Atherosclerotic heart disease of native coronary artery without angina pectoris: Secondary | ICD-10-CM | POA: Diagnosis present

## 2014-08-28 DIAGNOSIS — E872 Acidosis: Secondary | ICD-10-CM

## 2014-08-28 DIAGNOSIS — N179 Acute kidney failure, unspecified: Secondary | ICD-10-CM | POA: Diagnosis present

## 2014-08-28 DIAGNOSIS — Z7982 Long term (current) use of aspirin: Secondary | ICD-10-CM

## 2014-08-28 DIAGNOSIS — E876 Hypokalemia: Principal | ICD-10-CM | POA: Diagnosis present

## 2014-08-28 DIAGNOSIS — T502X5A Adverse effect of carbonic-anhydrase inhibitors, benzothiadiazides and other diuretics, initial encounter: Secondary | ICD-10-CM | POA: Diagnosis present

## 2014-08-28 DIAGNOSIS — F17210 Nicotine dependence, cigarettes, uncomplicated: Secondary | ICD-10-CM | POA: Diagnosis present

## 2014-08-28 DIAGNOSIS — Z8249 Family history of ischemic heart disease and other diseases of the circulatory system: Secondary | ICD-10-CM

## 2014-08-28 DIAGNOSIS — E1143 Type 2 diabetes mellitus with diabetic autonomic (poly)neuropathy: Secondary | ICD-10-CM | POA: Diagnosis not present

## 2014-08-28 DIAGNOSIS — E8729 Other acidosis: Secondary | ICD-10-CM

## 2014-08-28 LAB — LIPID PANEL
CHOL/HDL RATIO: 2.6 ratio (ref 0.0–4.4)
Cholesterol, Total: 88 mg/dL — ABNORMAL LOW (ref 100–199)
HDL: 34 mg/dL — ABNORMAL LOW (ref >39)
LDL CALC: 29 mg/dL (ref 0–99)
Triglycerides: 124 mg/dL (ref 0–149)
VLDL Cholesterol Cal: 25 mg/dL (ref 5–40)

## 2014-08-28 LAB — GLUCOSE, CAPILLARY
GLUCOSE-CAPILLARY: 101 mg/dL — AB (ref 65–99)
GLUCOSE-CAPILLARY: 130 mg/dL — AB (ref 65–99)
Glucose-Capillary: 99 mg/dL (ref 65–99)
Glucose-Capillary: 113 mg/dL — ABNORMAL HIGH (ref 65–99)
Glucose-Capillary: 121 mg/dL — ABNORMAL HIGH (ref 65–99)
Glucose-Capillary: 125 mg/dL — ABNORMAL HIGH (ref 65–99)
Glucose-Capillary: 179 mg/dL — ABNORMAL HIGH (ref 65–99)

## 2014-08-28 LAB — CBC WITH DIFFERENTIAL/PLATELET
Basophils Absolute: 0 10*3/uL (ref 0.0–0.1)
Basophils Relative: 0 % (ref 0–1)
EOS PCT: 3 % (ref 0–5)
Eosinophils Absolute: 0.3 10*3/uL (ref 0.0–0.7)
HEMATOCRIT: 33.4 % — AB (ref 36.0–46.0)
HEMOGLOBIN: 10.5 g/dL — AB (ref 12.0–15.0)
LYMPHS ABS: 2.7 10*3/uL (ref 0.7–4.0)
Lymphocytes Relative: 34 % (ref 12–46)
MCH: 26.1 pg (ref 26.0–34.0)
MCHC: 31.4 g/dL (ref 30.0–36.0)
MCV: 82.9 fL (ref 78.0–100.0)
Monocytes Absolute: 0.4 10*3/uL (ref 0.1–1.0)
Monocytes Relative: 5 % (ref 3–12)
Neutro Abs: 4.7 10*3/uL (ref 1.7–7.7)
Neutrophils Relative %: 58 % (ref 43–77)
PLATELETS: 383 10*3/uL (ref 150–400)
RBC: 4.03 MIL/uL (ref 3.87–5.11)
RDW: 16.3 % — ABNORMAL HIGH (ref 11.5–15.5)
WBC: 8 10*3/uL (ref 4.0–10.5)

## 2014-08-28 LAB — BASIC METABOLIC PANEL
ANION GAP: 14 (ref 5–15)
Anion gap: 16 — ABNORMAL HIGH (ref 5–15)
Anion gap: 9 (ref 5–15)
BUN/Creatinine Ratio: 5 — ABNORMAL LOW (ref 11–26)
BUN: 6 mg/dL — AB (ref 8–27)
BUN: 6 mg/dL (ref 6–20)
BUN: 5 mg/dL — ABNORMAL LOW (ref 6–20)
BUN: 5 mg/dL — AB (ref 6–20)
CALCIUM: 8.3 mg/dL — AB (ref 8.9–10.3)
CHLORIDE: 85 mmol/L — AB (ref 101–111)
CHLORIDE: 95 mmol/L — AB (ref 101–111)
CO2: 38 mmol/L — ABNORMAL HIGH (ref 18–29)
CO2: 38 mmol/L — AB (ref 22–32)
CO2: 38 mmol/L — ABNORMAL HIGH (ref 22–32)
CO2: 32 mmol/L (ref 22–32)
CREATININE: 1.18 mg/dL — AB (ref 0.57–1.00)
Calcium: 9.3 mg/dL (ref 8.7–10.3)
Calcium: 7.5 mg/dL — ABNORMAL LOW (ref 8.9–10.3)
Calcium: 7.9 mg/dL — ABNORMAL LOW (ref 8.9–10.3)
Chloride: 88 mmol/L — ABNORMAL LOW (ref 97–108)
Chloride: 94 mmol/L — ABNORMAL LOW (ref 101–111)
Creatinine, Ser: 1.33 mg/dL — ABNORMAL HIGH (ref 0.44–1.00)
Creatinine, Ser: 1 mg/dL (ref 0.44–1.00)
Creatinine, Ser: 1.01 mg/dL — ABNORMAL HIGH (ref 0.44–1.00)
GFR calc Af Amer: 58 mL/min/{1.73_m2} — ABNORMAL LOW (ref >59)
GFR calc Af Amer: >60 mL/min (ref >60)
GFR calc non Af Amer: 42 mL/min — ABNORMAL LOW (ref >60)
GFR calc non Af Amer: 60 mL/min — ABNORMAL LOW (ref >60)
GFR, EST AFRICAN AMERICAN: 49 mL/min — AB (ref >60)
GFR, EST NON AFRICAN AMERICAN: 50 mL/min/{1.73_m2} — AB (ref >59)
GFR, EST NON AFRICAN AMERICAN: 59 mL/min — AB (ref >60)
Glucose, Bld: 202 mg/dL — ABNORMAL HIGH (ref 65–99)
Glucose, Bld: 101 mg/dL — ABNORMAL HIGH (ref 65–99)
Glucose, Bld: 134 mg/dL — ABNORMAL HIGH (ref 65–99)
Glucose: 125 mg/dL — ABNORMAL HIGH (ref 65–99)
Potassium: 1.9 mmol/L — CL (ref 3.5–5.2)
Potassium: 1.4 mmol/L — CL (ref 3.5–5.1)
Potassium: <2 mmol/L — CL (ref 3.5–5.1)
SODIUM: 141 mmol/L (ref 135–145)
Sodium: 145 mmol/L — ABNORMAL HIGH (ref 134–144)
Sodium: 139 mmol/L (ref 135–145)
Sodium: 141 mmol/L (ref 135–145)

## 2014-08-28 LAB — CLOSTRIDIUM DIFFICILE BY PCR: Toxigenic C. Difficile by PCR: NEGATIVE

## 2014-08-28 LAB — LACTIC ACID, PLASMA: Lactic Acid, Venous: 1.4 mmol/L (ref 0.5–2.0)

## 2014-08-28 LAB — PHOSPHORUS
PHOSPHORUS: 2.3 mg/dL — AB (ref 2.5–4.6)
Phosphorus: 1.9 mg/dL — ABNORMAL LOW (ref 2.5–4.6)

## 2014-08-28 LAB — MAGNESIUM: Magnesium: 1.6 mg/dL (ref 1.6–2.3)

## 2014-08-28 LAB — HEPATIC FUNCTION PANEL
ALBUMIN: 4.1 g/dL (ref 3.6–4.8)
ALT: 17 IU/L (ref 0–32)
AST: 40 IU/L (ref 0–40)
Alkaline Phosphatase: 80 IU/L (ref 39–117)
BILIRUBIN, DIRECT: 0.14 mg/dL (ref 0.00–0.40)
Bilirubin Total: 0.3 mg/dL (ref 0.0–1.2)
Total Protein: 7.2 g/dL (ref 6.0–8.5)

## 2014-08-28 LAB — MRSA PCR SCREENING: MRSA BY PCR: NEGATIVE

## 2014-08-28 LAB — TSH: TSH: 3.74 u[IU]/mL (ref 0.450–4.500)

## 2014-08-28 MED ORDER — SODIUM CHLORIDE 0.9 % IV SOLN
INTRAVENOUS | Status: DC
  Administered 2014-08-28 – 2014-08-29 (×3): via INTRAVENOUS

## 2014-08-28 MED ORDER — POTASSIUM CHLORIDE 20 MEQ PO PACK
40.0000 meq | PACK | ORAL | Status: AC
  Administered 2014-08-28 (×3): 40 meq via ORAL
  Filled 2014-08-28 (×3): qty 2

## 2014-08-28 MED ORDER — POTASSIUM CHLORIDE CRYS ER 20 MEQ PO TBCR
40.0000 meq | EXTENDED_RELEASE_TABLET | Freq: Once | ORAL | Status: AC
  Administered 2014-08-28: 40 meq via ORAL
  Filled 2014-08-28: qty 2

## 2014-08-28 MED ORDER — ASPIRIN EC 81 MG PO TBEC
81.0000 mg | DELAYED_RELEASE_TABLET | Freq: Every day | ORAL | Status: DC
  Administered 2014-08-29 – 2014-08-30 (×2): 81 mg via ORAL
  Filled 2014-08-28 (×3): qty 1

## 2014-08-28 MED ORDER — POTASSIUM CHLORIDE 10 MEQ/100ML IV SOLN
INTRAVENOUS | Status: AC
  Filled 2014-08-28: qty 100

## 2014-08-28 MED ORDER — POTASSIUM CHLORIDE 10 MEQ/100ML IV SOLN
10.0000 meq | Freq: Once | INTRAVENOUS | Status: AC
  Administered 2014-08-28: 10 meq via INTRAVENOUS

## 2014-08-28 MED ORDER — SODIUM CHLORIDE 0.9 % IV BOLUS (SEPSIS)
1000.0000 mL | Freq: Once | INTRAVENOUS | Status: AC
  Administered 2014-08-28: 1000 mL via INTRAVENOUS

## 2014-08-28 MED ORDER — ROSUVASTATIN CALCIUM 20 MG PO TABS
20.0000 mg | ORAL_TABLET | Freq: Every day | ORAL | Status: DC
  Administered 2014-08-28 – 2014-08-29 (×2): 20 mg via ORAL
  Filled 2014-08-28 (×2): qty 1

## 2014-08-28 MED ORDER — LEVOTHYROXINE SODIUM 75 MCG PO TABS
75.0000 ug | ORAL_TABLET | Freq: Every day | ORAL | Status: DC
  Administered 2014-08-29 – 2014-08-30 (×2): 75 ug via ORAL
  Filled 2014-08-28 (×2): qty 1

## 2014-08-28 MED ORDER — DEXTROSE-NACL 5-0.45 % IV SOLN
INTRAVENOUS | Status: DC
  Administered 2014-08-28: 13:00:00 via INTRAVENOUS

## 2014-08-28 MED ORDER — OXYBUTYNIN CHLORIDE ER 5 MG PO TB24
10.0000 mg | ORAL_TABLET | Freq: Every day | ORAL | Status: DC
  Administered 2014-08-28 – 2014-08-30 (×3): 10 mg via ORAL
  Filled 2014-08-28 (×3): qty 2

## 2014-08-28 MED ORDER — POTASSIUM CHLORIDE 10 MEQ/100ML IV SOLN
10.0000 meq | INTRAVENOUS | Status: AC
  Administered 2014-08-28 – 2014-08-29 (×6): 10 meq via INTRAVENOUS
  Filled 2014-08-28 (×6): qty 100

## 2014-08-28 MED ORDER — MAGNESIUM SULFATE 2 GM/50ML IV SOLN
2.0000 g | Freq: Once | INTRAVENOUS | Status: AC
  Administered 2014-08-28: 2 g via INTRAVENOUS
  Filled 2014-08-28: qty 50

## 2014-08-28 MED ORDER — POTASSIUM CHLORIDE 10 MEQ/100ML IV SOLN
10.0000 meq | INTRAVENOUS | Status: AC
  Administered 2014-08-28 (×4): 10 meq via INTRAVENOUS
  Filled 2014-08-28 (×2): qty 100

## 2014-08-28 MED ORDER — SODIUM CHLORIDE 0.9 % IV SOLN: INTRAVENOUS | Status: DC

## 2014-08-28 NOTE — ED Notes (Signed)
CRITICAL VALUE ALERT  Critical value received:  K+  Date of notification:  08/28/14  Time of notification:  3291  Critical value read back:Yes.    Nurse who received alert:  Lakeena Downie,RN  MD notified (1st page):  Delos Haring, PA  Time of first page:  [phone]

## 2014-08-28 NOTE — Addendum Note (Signed)
Addended by: Launa Grill on: 08/28/2014 09:21 AM   Modules accepted: Orders

## 2014-08-28 NOTE — H&P (Signed)
History and Physical  Rachel [name] LGX:[id number] DOB: 1950/03/11 DOA: 08/28/2014  Referring physician: ED Physician  PCP: [name], MD  Cardiology: Dr. [name]    Chief Complaint: Hypokalemia   HPI:  64 y/o women sent to ED by PCP for hypokalemia. Thought to be complicated by diarrhea secondary to Metformin.   She has been having diarrhea for the past month, multiple episodes a day (4-5). No blood in the stool. She has been taking Metformin for the past year, last dose was 2 days ago.  She has associated abdominal discomfort, that feels like gas but denies any pain. No nausea or vomiting.  She does not have much of an appetite, but can eat half a sandwich. Mild dizziness, muscle aches, and difficulty focusing with vision for the past month intermittently. Pt describes her arm not feeling like hers when the muscle aches onset, present several days. Sore throat this week, but no fever or other URI sxs.   In the emergency department pt is afebrile, VSS, no hypoxia.  Pertinent labs: Potassium 1.4, Creatinine 1.33. Anion gap 16. Glucose 202. Hgb 10.5.  EKG: Independently reviewed. Sinus rhythm, prolonged QT.    Review of Systems:  Negative for fever, rash, chest pain, SOB, dysuria, hematuria, bleeding, n/v/, and abdominal pain. Positive muscle aches and visual changes with difficulty focusing.   Past Medical History  Diagnosis Date  . Diabetes type 2, controlled 2000  . Hyperlipidemia   . Essential hypertension, benign   . GERD (gastroesophageal reflux disease)   . Fatty liver   . Tubular adenoma of colon 07/2007    Multiple colonic polyps  . Coronary atherosclerosis of native coronary artery     DES x 2 (overlapping) RCA 03/2008 - patent 2011  . Odynophagia     Severe esophagitis on CT 05/2010, treated empirically with Diflucan  . Microalbuminuria   . Venous stasis   . Obesity   . Reflux   . PAD (peripheral artery disease)     history of left greater than right lower 70  claudication    Past Surgical History  Procedure Laterality Date  . Tubal ligation      Bilateral  . Cystectomy      Pilonidal  . Colonoscopy w/ polypectomy  07/2007    Tubular adenoma  . Esophagogastroduodenoscopy  04/2010    RMR: Normal esophagus , Stomach D1 and D2   . Coronary stent placement      Social History:  reports that she has been smoking Cigarettes.  She has a 43 pack-year smoking history. She has never used smokeless tobacco. She reports that she does not drink alcohol or use illicit drugs. lives alone Self-care  Allergies  Allergen Reactions  . Altace [Ramipril] Cough  . Biaxin [Clarithromycin]     Fatigue  . Niacin And Related Other (See Comments)    Flush - felt like on fire.    Family History  Problem Relation Age of Onset  . Bone cancer Father   . Heart disease Maternal Grandmother   . Colon cancer Neg Hx   . Liver disease Neg Hx   . Inflammatory bowel disease Neg Hx   . Hypertension Mother      Prior to Admission medications   Medication Sig Start Date End Date Taking? Authorizing Provider  acetaminophen (TYLENOL) 500 MG tablet Take 1,000 mg by mouth every 6 (six) hours as needed. For pain     Historical Provider, MD  aspirin EC 81 MG tablet Take 81  mg by mouth daily.      Historical Provider, MD  B-D INS SYRINGE 0.5CC/31GX5/16 31G X 5/16" 0.5 ML MISC USE AS DIRECTED (WITH LANTUS INSULIN) 05/02/12   Nilda Simmer, NP  buPROPion [facility] SR) 150 MG 12 hr tablet Take 1 tablet q am x 3 days then 1 tablet po BID 02/03/14   Mikey Kirschner, MD  CRESTOR 20 MG tablet Take 1 tablet by mouth  every night at bedtime 05/15/14   Kathyrn Drown, MD  etodolac (LODINE) 400 MG tablet TAKE 1 TABLET (400 MG TOTAL) BY MOUTH 2 (TWO) TIMES DAILY. TAKE WITH FOOD 02/03/14   Mikey Kirschner, MD  hydrochlorothiazide (MICROZIDE) 12.5 MG capsule Take 1 capsule by mouth  daily 05/15/14   Kathyrn Drown, MD  INVOKANA 100 MG TABS tablet Take 1 tablet by mouth  daily 05/15/14    Kathyrn Drown, MD  LANTUS SOLOSTAR 100 UNIT/ML Solostar Pen Inject 0.5 mLs (50 Units  total) into the skin at  bedtime. 05/15/14   Kathyrn Drown, MD  levothyroxine (SYNTHROID, LEVOTHROID) 75 MCG tablet Take 1 tablet by mouth  daily before breakfast 05/15/14   Kathyrn Drown, MD  meclizine (ANTIVERT) 25 MG tablet TAKE 1 TABLET (25 MG TOTAL) BY MOUTH EVERY 6 (SIX) HOURS AS NEEDED FOR DIZZINESS OR NAUSEA. 01/15/14   Kathyrn Drown, MD  metFORMIN (GLUCOPHAGE) 1000 MG tablet Take 1 tablet by mouth 2 (two) times daily. 06/10/14   Historical Provider, MD  metFORMIN (GLUCOPHAGE) 500 MG tablet Take 1 tablet (500 mg total) by mouth 2 (two) times daily with a meal. 08/26/14   Kathyrn Drown, MD  nitroGLYCERIN (NITROSTAT) 0.4 MG SL tablet Place 0.4 mg under the tongue every 5 (five) minutes as needed. For chest pains    Historical Provider, MD  [facility] VERIO test strip  11/15/12   Historical Provider, MD  oxybutynin (DITROPAN-XL) 10 MG 24 hr tablet Take 2 tablets by mouth  every day 08/14/14   Mikey Kirschner, MD   Physical Exam: Filed Vitals:   08/28/14 0955 08/28/14 1103  BP: 122/72 134/74  Pulse: 91 80  Temp: 98.9 F (37.2 C)   TempSrc: Oral   Resp: 16 12  SpO2: 99% 99%    General:  Appears calm and comfortable Eyes: PERRL, normal lids, irises & conjunctiva ENT: grossly normal hearing, lips & tongue Neck: no LAD, masses or thyromegaly Cardiovascular: RRR, no m/r/g. No LE edema. Respiratory: CTA bilaterally, no w/r/r. Normal respiratory effort. Abdomen: soft, ntnd Skin: no rash or induration noted Musculoskeletal: grossly normal tone BUE/BLE Psychiatric: grossly normal mood and affect, speech fluent and appropriate Neurologic: grossly non-focal.  Wt Readings from Last 3 Encounters:  08/26/14 64.864 kg (143 lb)  08/12/14 68.04 kg (150 lb)  06/24/14 66.679 kg (147 lb)    Labs on Admission:  Basic Metabolic Panel:  Recent Labs Lab 08/27/14 0936 08/28/14 1005  NA 145* 139  K 1.9* 1.4*    CL 88* 85*  CO2 38* 38*  GLUCOSE 125* 202*  BUN 6* 6  CREATININE 1.18* 1.33*  CALCIUM 9.3 8.3*  MG 1.6  --     Liver Function Tests:  Recent Labs Lab 08/27/14 0936  AST 40  ALT 17  ALKPHOS 80  BILITOT 0.3  PROT 7.2   CBC:  Recent Labs Lab 08/28/14 1005  WBC 8.0  NEUTROABS 4.7  HGB 10.5*  HCT 33.4*  MCV 82.9  PLT 383  Principal Problem:   Hypokalemia Active Problems:   Diabetes   AKI (acute kidney injury)   Diarrhea   High anion gap metabolic acidosis   Assessment/Plan 1. Profound symptomatic hypokalemia, secondary to diarrhea from Metformin complicated by HCTZ.  2. Diarrhea, improved since off metformin 48 hours. No fever, abd pain or signs of intraabdominal process. 3. AKI secondary to dehydration from diarrhea. 4. Anion Gap Metabolic Acidosis, consider "euglycemic" DKA secondary to Invokana, although bicarb is high. Consider uremia but seems less likely. Off metformin 48 hours, doubt lactic acidosis. 5. Hypomagnesia.   6. Prolonged QT secondary to hypokalemia.    Admit to SDU  Aggressive potassium repletion PO and IV. Replete magnesium. Stop HCTZ.  Aggressive IVF for dehydration and suspected DKA  Treat as DKA, may need insulin if not improved with fluids. Hold off on insulin until potassium higher. Stop Invokana. SSI.  Serial BMP; magnesium in AM  Monitor on telemetry  EKG in the morning   Code Status: Full  DVT prophylaxis:SCDs Family Communication: discussed care plan with pt and she understands, no concerns at this time. Disposition Plan/Anticipated LOS: Admit 2 days   Time spent: 64 minutes  Murray Hodgkins, MD  [facility] Pager [phone] 08/28/2014, 12:06 PM     I, Laban Emperor. Leonie Green, am scribing for Dr. Melene Plan. Sarajane Jews. MD on 08/28/2014 at 12:11 PM  I have reviewed the above documentation for accuracy and completeness, and I agree with the above. Murray Hodgkins, MD

## 2014-08-28 NOTE — ED Provider Notes (Signed)
CSN: [id number]     Arrival date & time 08/28/14  9735 History   First MD Initiated Contact with Patient 08/28/14 [phone]     Chief Complaint  Patient presents with  . Abnormal Lab     (Consider location/radiation/quality/duration/timing/severity/associated sxs/prior Treatment) HPI    PCP: [name], MD Blood pressure 122/72, pulse 91, temperature 98.9 F (37.2 C), temperature source Oral, resp. rate 16, SpO2 99 %.  Rachel [name] is a 64 y.o.female with a significant PMH of diabetes, hyperlipidemia, hypertension, ERD, CAD presents to the ER with complaints of abnormal potassium. Her PCP Dr.  Wolfgang Phoenix called and advised that her potassium is low. She has been having severe diarrhea for the past month- presumably due to Metformin side affect- and therefore was not eating if she needed to be at work but continued to have diarrhea. She saw her PCP on Wednesday and he drew blood work showing potassium of 1.9. Since her visit she has had 1 episode of diarrhea since not taking any of her Metformin. She reports feeling well otherwise.  The patient denies diaphoresis, fever, loc, headache, weakness (general or focal), confusion, change of vision,  neck pain, dysphagia, aphagia, chest pain, shortness of breath,  back pain, abdominal pains, nausea, vomiting,  lower extremity swelling, rash.  Past Medical History  Diagnosis Date  . Diabetes type 2, controlled 2000  . Hyperlipidemia   . Essential hypertension, benign   . GERD (gastroesophageal reflux disease)   . Fatty liver   . Tubular adenoma of colon 07/2007    Multiple colonic polyps  . Coronary atherosclerosis of native coronary artery     DES x 2 (overlapping) RCA 03/2008 - patent 2011  . Odynophagia     Severe esophagitis on CT 05/2010, treated empirically with Diflucan  . Microalbuminuria   . Venous stasis   . Obesity   . Reflux   . PAD (peripheral artery disease)     history of left greater than right lower 70 claudication   Past  Surgical History  Procedure Laterality Date  . Tubal ligation      Bilateral  . Cystectomy      Pilonidal  . Colonoscopy w/ polypectomy  07/2007    Tubular adenoma  . Esophagogastroduodenoscopy  04/2010    RMR: Normal esophagus , Stomach D1 and D2    Family History  Problem Relation Age of Onset  . Bone cancer Father   . Heart disease Maternal Grandmother   . Colon cancer Neg Hx   . Liver disease Neg Hx   . Inflammatory bowel disease Neg Hx   . Hypertension Mother    History  Substance Use Topics  . Smoking status: Current Every Day Smoker -- 1.00 packs/day for 43 years    Types: Cigarettes  . Smokeless tobacco: Never Used  . Alcohol Use: No   OB History    No data available     Review of Systems  10 Systems reviewed and are negative for acute change except as noted in the HPI.   Allergies  Altace; Biaxin; and Niacin and related  Home Medications   Prior to Admission medications   Medication Sig Start Date End Date Taking? Authorizing Provider  acetaminophen (TYLENOL) 500 MG tablet Take 1,000 mg by mouth every 6 (six) hours as needed. For pain     Historical Provider, MD  aspirin EC 81 MG tablet Take 81 mg by mouth daily.      Historical Provider, MD  B-D INS  SYRINGE 0.5CC/31GX5/16 31G X 5/16" 0.5 ML MISC USE AS DIRECTED (WITH LANTUS INSULIN) 05/02/12   Nilda Simmer, NP  buPROPion [facility] SR) 150 MG 12 hr tablet Take 1 tablet q am x 3 days then 1 tablet po BID 02/03/14   Mikey Kirschner, MD  CRESTOR 20 MG tablet Take 1 tablet by mouth  every night at bedtime 05/15/14   Kathyrn Drown, MD  etodolac (LODINE) 400 MG tablet TAKE 1 TABLET (400 MG TOTAL) BY MOUTH 2 (TWO) TIMES DAILY. TAKE WITH FOOD 02/03/14   Mikey Kirschner, MD  hydrochlorothiazide (MICROZIDE) 12.5 MG capsule Take 1 capsule by mouth  daily 05/15/14   Kathyrn Drown, MD  INVOKANA 100 MG TABS tablet Take 1 tablet by mouth  daily 05/15/14   Kathyrn Drown, MD  LANTUS SOLOSTAR 100 UNIT/ML Solostar Pen  Inject 0.5 mLs (50 Units  total) into the skin at  bedtime. 05/15/14   Kathyrn Drown, MD  levothyroxine (SYNTHROID, LEVOTHROID) 75 MCG tablet Take 1 tablet by mouth  daily before breakfast 05/15/14   Kathyrn Drown, MD  meclizine (ANTIVERT) 25 MG tablet TAKE 1 TABLET (25 MG TOTAL) BY MOUTH EVERY 6 (SIX) HOURS AS NEEDED FOR DIZZINESS OR NAUSEA. 01/15/14   Kathyrn Drown, MD  metFORMIN (GLUCOPHAGE) 1000 MG tablet Take 1 tablet by mouth 2 (two) times daily. 06/10/14   Historical Provider, MD  metFORMIN (GLUCOPHAGE) 500 MG tablet Take 1 tablet (500 mg total) by mouth 2 (two) times daily with a meal. 08/26/14   Kathyrn Drown, MD  nitroGLYCERIN (NITROSTAT) 0.4 MG SL tablet Place 0.4 mg under the tongue every 5 (five) minutes as needed. For chest pains    Historical Provider, MD  [facility] VERIO test strip  11/15/12   Historical Provider, MD  oxybutynin (DITROPAN-XL) 10 MG 24 hr tablet Take 2 tablets by mouth  every day 08/14/14   Mikey Kirschner, MD   BP 122/72 mmHg  Pulse 91  Temp(Src) 98.9 F (37.2 C) (Oral)  Resp 16  SpO2 99% Physical Exam  Constitutional: She appears well-developed and well-nourished. No distress.  HENT:  Head: Normocephalic and atraumatic.  Eyes: Pupils are equal, round, and reactive to light.  Neck: Normal range of motion. Neck supple.  Cardiovascular: Normal rate and regular rhythm.   Pulmonary/Chest: Effort normal. No respiratory distress. She has no wheezes.  Abdominal: Soft. Bowel sounds are normal. She exhibits no distension and no fluid wave. There is no tenderness. There is no rigidity and no rebound.  Neurological: She is alert.  Skin: Skin is warm and dry. She is not diaphoretic. No pallor.  Nursing note and vitals reviewed.   ED Course  Procedures (including critical care time) Labs Review Labs Reviewed  CBC WITH DIFFERENTIAL/PLATELET - Abnormal; Notable for the following:    Hemoglobin 10.5 (*)    HCT 33.4 (*)    RDW 16.3 (*)    All other components within  normal limits  BASIC METABOLIC PANEL - Abnormal; Notable for the following:    Potassium 1.4 (*)    Chloride 85 (*)    CO2 38 (*)    Glucose, Bld 202 (*)    Creatinine, Ser 1.33 (*)    Calcium 8.3 (*)    GFR calc non Af Amer 42 (*)    GFR calc Af Amer 49 (*)    Anion gap 16 (*)    All other components within normal limits    Imaging Review  No results found.   EKG Interpretation None      MDM   Final diagnoses:  Diarrhea  Hypokalemia    Patients labs in the ED today show that her potassium has decreased even further to 1.4. Medications  potassium chloride 10 mEq in 100 mL IVPB (not administered)  potassium chloride 10 MEQ/100ML IVPB (not administered)   Will admit to hospitalist service for IV potassium replacement. Pt is agreeable to this plan and all questions to this point have been answered.  Filed Vitals:   08/28/14 0955  BP: 122/72  Pulse: 91  Temp: 98.9 F (37.2 C)  Resp: [address] PA-C 08/28/14 [place], MD 08/28/14 1739

## 2014-08-28 NOTE — ED Notes (Signed)
Pt received a phone call stating that potassium was low. (1.9)

## 2014-08-29 DIAGNOSIS — E1143 Type 2 diabetes mellitus with diabetic autonomic (poly)neuropathy: Secondary | ICD-10-CM

## 2014-08-29 DIAGNOSIS — R197 Diarrhea, unspecified: Secondary | ICD-10-CM

## 2014-08-29 LAB — BASIC METABOLIC PANEL
ANION GAP: 8 (ref 5–15)
Anion gap: 10 (ref 5–15)
Anion gap: 9 (ref 5–15)
BUN: <5 mg/dL — ABNORMAL LOW (ref 6–20)
BUN: <5 mg/dL — ABNORMAL LOW (ref 6–20)
BUN: <5 mg/dL — ABNORMAL LOW (ref 6–20)
CALCIUM: 7.2 mg/dL — AB (ref 8.9–10.3)
CHLORIDE: 102 mmol/L (ref 101–111)
CO2: 32 mmol/L (ref 22–32)
CO2: 31 mmol/L (ref 22–32)
CO2: 29 mmol/L (ref 22–32)
CREATININE: 0.95 mg/dL (ref 0.44–1.00)
Calcium: 7.4 mg/dL — ABNORMAL LOW (ref 8.9–10.3)
Calcium: 7.6 mg/dL — ABNORMAL LOW (ref 8.9–10.3)
Chloride: 100 mmol/L — ABNORMAL LOW (ref 101–111)
Chloride: 101 mmol/L (ref 101–111)
Creatinine, Ser: 0.95 mg/dL (ref 0.44–1.00)
Creatinine, Ser: 0.99 mg/dL (ref 0.44–1.00)
GFR calc Af Amer: >60 mL/min (ref >60)
GFR calc Af Amer: >60 mL/min (ref >60)
GFR calc non Af Amer: >60 mL/min (ref >60)
GFR calc non Af Amer: >60 mL/min (ref >60)
GLUCOSE: 147 mg/dL — AB (ref 65–99)
Glucose, Bld: 145 mg/dL — ABNORMAL HIGH (ref 65–99)
Glucose, Bld: 296 mg/dL — ABNORMAL HIGH (ref 65–99)
POTASSIUM: 2.3 mmol/L — AB (ref 3.5–5.1)
POTASSIUM: 2.5 mmol/L — AB (ref 3.5–5.1)
Potassium: 2 mmol/L — CL (ref 3.5–5.1)
SODIUM: 142 mmol/L (ref 135–145)
SODIUM: 139 mmol/L (ref 135–145)
Sodium: 141 mmol/L (ref 135–145)

## 2014-08-29 LAB — GLUCOSE, CAPILLARY
GLUCOSE-CAPILLARY: 294 mg/dL — AB (ref 65–99)
Glucose-Capillary: 122 mg/dL — ABNORMAL HIGH (ref 65–99)
Glucose-Capillary: 197 mg/dL — ABNORMAL HIGH (ref 65–99)

## 2014-08-29 LAB — MAGNESIUM: Magnesium: 1.7 mg/dL (ref 1.7–2.4)

## 2014-08-29 LAB — PHOSPHORUS: Phosphorus: 2.3 mg/dL — ABNORMAL LOW (ref 2.5–4.6)

## 2014-08-29 MED ORDER — MAGNESIUM SULFATE 2 GM/50ML IV SOLN
2.0000 g | Freq: Once | INTRAVENOUS | Status: AC
  Administered 2014-08-29: 2 g via INTRAVENOUS
  Filled 2014-08-29: qty 50

## 2014-08-29 MED ORDER — POTASSIUM CHLORIDE CRYS ER 20 MEQ PO TBCR
40.0000 meq | EXTENDED_RELEASE_TABLET | ORAL | Status: AC
  Administered 2014-08-29 (×4): 40 meq via ORAL
  Filled 2014-08-29 (×6): qty 2

## 2014-08-29 MED ORDER — POTASSIUM CHLORIDE 10 MEQ/100ML IV SOLN
INTRAVENOUS | Status: AC
Start: 2014-08-29 — End: 2014-08-30
  Filled 2014-08-29: qty 100

## 2014-08-29 MED ORDER — POTASSIUM CHLORIDE 10 MEQ/100ML IV SOLN
10.0000 meq | INTRAVENOUS | Status: AC
  Administered 2014-08-29 – 2014-08-30 (×4): 10 meq via INTRAVENOUS
  Filled 2014-08-29: qty 100

## 2014-08-29 MED ORDER — POTASSIUM CHLORIDE 10 MEQ/100ML IV SOLN
10.0000 meq | INTRAVENOUS | Status: AC
  Administered 2014-08-29 (×4): 10 meq via INTRAVENOUS
  Filled 2014-08-29 (×2): qty 100

## 2014-08-29 NOTE — Progress Notes (Signed)
CRITICAL VALUE ALERT  Critical value received:  K+ 2.3  Date of notification: 08/29/14  Time of notification:0940  Critical value read back: yes  Nurse who received alert:  E.Fenton Candee RN  MD notified (1st page): Dr. Sarajane Jews  Time of first page:  0950  MD notified (2nd page):  Time of second page:  Responding MD:  Dr. Sarajane Jews  Time MD responded:  [phone]

## 2014-08-29 NOTE — Progress Notes (Signed)
CRITICAL VALUE ALERT  Critical value received:  K+ 2.0  Date of notification:  08/29/14  Time of notification:  0710  Critical value read back: yes  Nurse who received alert:  Miquel Dunn RN  MD notified (1st page):  Dr. Florencia Reasons  Time of first page:  0715  MD notified (2nd page):  Time of second page:  Responding MD:  Dr. Florencia Reasons  Time MD responded:  [phone]

## 2014-08-29 NOTE — Progress Notes (Addendum)
Pt a/o.vss. IV patent. No complaints of any distress. Report given to C.Knight, RN, pt transferred to room 313 via wheelchair by nursing staff.

## 2014-08-29 NOTE — Progress Notes (Signed)
CRITICAL VALUE ALERT  Critical value received:  K+ 2.5  Date of notification:  08/29/14  Time of notification:  3601  Critical value read back: yes  Nurse who received alert:  E.Eustace Hur RN  MD notified (1st page):  Dr.Goodrich  Time of first page:  1638  MD notified (2nd page):  Time of second page:  Responding MD: Dr. Sarajane Jews  Time MD responded:  [phone]

## 2014-08-29 NOTE — Progress Notes (Signed)
CRITICAL VALUE ALERT  Critical value received:  K+ less than 2.0   Date of notification:  08/28/14  Time of notification:  2100  Critical value read back: yes  Nurse who received alert:  Candiss Norse RN  MD notified (1st page):  M. Donnal Debar NP  Time of first page:  2105  MD notified (2nd page):  Time of second page:  Responding MD:  M. Donnal Debar NP  Time MD responded:  2110

## 2014-08-29 NOTE — Progress Notes (Signed)
PROGRESS NOTE  Rachel [name] FTD:[id number] DOB: 1950-09-01 DOA: 08/28/2014 PCP: [name], MD  Summary: 64 y/o women sent to ED by PCP for hypokalemia. Thought to be complicated by diarrhea secondary to Metformin.  Assessment/Plan: 1. Profound symptomatic hypokalemia, secondary to diarrhea from metformin complicated by HCTZ. Potassium is showing significant improvement. BMP serial, now Q6. 2. Diarrhea. Resolved. No fever, abdominal pain, or signs of intraabdominal process. cdiff was negative. 3. AKI secondary to dehydration from diarrhea. Resolved.  4. AG metabolic acidosis, consider "euglycemic" DKA secondary to Invokana, although bicarb was high. Resolved with fluids. 5. Hypomagnesia.repleted. 6. Prolonged QT secondary to hypokalemia. Prolonged QT improved with increasing potassium.    Overall improving, K+ up, QTc down.  Potassium PO every four hours. Continue IV potassium  Check BMP later today.  Possibly upstairs today and home tomorrow.  EKG in AM  Code Status: Full DVT prophylaxis: SCDs  Family Communication:Sister, niece, and friend at bedside discussed care plan.- 08/29/14 Disposition Plan: Discharge home once improved  Murray Hodgkins, MD  [facility]  Pager [phone] If 7PM-7AM, please contact night-coverage at [email or web address], password [facility] 08/29/2014, 9:21 AM  LOS: 1 day   Consultants:    Procedures:    Antibiotics:    HPI/Subjective: Pt states feeling better, diarrhea has resolved, she is able to eat, and no reports of chest pain, shortness of breath, n/v/ or abd pain.  Objective: Filed Vitals:   08/29/14 0528 08/29/14 0600 08/29/14 0700 08/29/14 0800  BP:  144/66 151/81 156/72  Pulse: 78 75 74 85  Temp:      TempSrc:      Resp: 19 18 15 23   Height:      Weight:      SpO2: 88% 100% 99% 96%    Intake/Output Summary (Last 24 hours) at 08/29/14 0921 Last data filed at 08/29/14 8315  Gross per 24 hour  Intake 5407.92 ml  Output    2200 ml  Net 3207.92 ml     Filed Weights   08/28/14 1233 08/29/14 0500  Weight: 65.5 kg (144 lb 6.4 oz) 68 kg (149 lb 14.6 oz)    Exam:     Afebrile. VSS.  General:  Appears calm and comfortable Cardiovascular: RRR, no m/r/g. No LE edema. Telemetry: SR, no arrhythmias  Respiratory: CTA bilaterally, no w/r/r. Normal respiratory effort. Abdomen: soft, ntnd Psychiatric: grossly normal mood and affect, speech fluent and appropriate Neurologic: grossly non-focal.  New data reviewed:  Potassium, improved, 2.3  08/29/14  Remainder BMP unreamarkable  Pertinent data since admission:  Repeat EKG independently reviewed sinus rhythm, prolonged QT, compared to previous study QT has shortened significantly.  Pending data:    Scheduled Meds: . aspirin EC  81 mg Oral Daily  . levothyroxine  75 mcg Oral QAC breakfast  . oxybutynin  10 mg Oral Daily  . potassium chloride  10 mEq Intravenous Q1 Hr x 4  . potassium chloride  40 mEq Oral Q4H  . rosuvastatin  20 mg Oral q1800   Continuous Infusions: . sodium chloride 150 mL/hr at 08/28/14 2212    Principal Problem:   Hypokalemia Active Problems:   Diabetes   AKI (acute kidney injury)   Diarrhea   High anion gap metabolic acidosis   Time spent 25 minutes  I, Jessica D. Leonie Green, am scribing for Dr. Melene Plan. Sarajane Jews. MD on 08/29/2014 at 9:21 AM  I have reviewed the above documentation for accuracy and completeness, and I agree with the above. Murray Hodgkins, MD

## 2014-08-30 LAB — GLUCOSE, CAPILLARY: Glucose-Capillary: 170 mg/dL — ABNORMAL HIGH (ref 65–99)

## 2014-08-30 LAB — BASIC METABOLIC PANEL
Anion gap: 9 (ref 5–15)
CO2: 25 mmol/L (ref 22–32)
Calcium: 8 mg/dL — ABNORMAL LOW (ref 8.9–10.3)
Chloride: 106 mmol/L (ref 101–111)
Creatinine, Ser: 0.87 mg/dL (ref 0.44–1.00)
GFR calc Af Amer: >60 mL/min (ref >60)
Glucose, Bld: 180 mg/dL — ABNORMAL HIGH (ref 65–99)
Potassium: 3.1 mmol/L — ABNORMAL LOW (ref 3.5–5.1)
Sodium: 140 mmol/L (ref 135–145)

## 2014-08-30 LAB — MAGNESIUM: Magnesium: 1.7 mg/dL (ref 1.7–2.4)

## 2014-08-30 MED ORDER — POTASSIUM PHOSPHATE MONOBASIC 500 MG PO TABS
500.0000 mg | ORAL_TABLET | Freq: Three times a day (TID) | ORAL | Status: DC
  Filled 2014-08-30 (×2): qty 1

## 2014-08-30 MED ORDER — BUPROPION HCL ER (SR) 150 MG PO TB12: 150.0000 mg | ORAL_TABLET | Freq: Every day | ORAL | Status: DC

## 2014-08-30 MED ORDER — INSULIN GLARGINE 100 UNIT/ML SOLOSTAR PEN: PEN_INJECTOR | SUBCUTANEOUS | Status: DC

## 2014-08-30 MED ORDER — POTASSIUM CHLORIDE CRYS ER 20 MEQ PO TBCR
40.0000 meq | EXTENDED_RELEASE_TABLET | Freq: Once | ORAL | Status: AC
  Administered 2014-08-30: 40 meq via ORAL

## 2014-08-30 MED ORDER — POTASSIUM CHLORIDE CRYS ER 20 MEQ PO TBCR: 40.0000 meq | EXTENDED_RELEASE_TABLET | Freq: Two times a day (BID) | ORAL | Status: DC

## 2014-08-30 MED ORDER — MAGNESIUM SULFATE 2 GM/50ML IV SOLN
2.0000 g | Freq: Once | INTRAVENOUS | Status: AC
  Administered 2014-08-30: 2 g via INTRAVENOUS
  Filled 2014-08-30: qty 50

## 2014-08-30 MED ORDER — POTASSIUM CHLORIDE CRYS ER 20 MEQ PO TBCR
40.0000 meq | EXTENDED_RELEASE_TABLET | ORAL | Status: DC
  Administered 2014-08-30: 40 meq via ORAL
  Filled 2014-08-30: qty 2

## 2014-08-30 MED ORDER — K PHOS MONO-SOD PHOS DI & MONO 155-852-130 MG PO TABS
500.0000 mg | ORAL_TABLET | Freq: Three times a day (TID) | ORAL | Status: DC
  Administered 2014-08-30: 500 mg via ORAL
  Filled 2014-08-30 (×2): qty 2

## 2014-08-30 NOTE — Progress Notes (Signed)
Patient with orders to be discharge home. Discharge instructions given, patient verbalized understanding. Patient stable. Patient left in private vehicle with family.  

## 2014-08-30 NOTE — Discharge Summary (Addendum)
Physician Discharge Summary  Rachel [name]:[id number] DOB: December 23, 1950 DOA: 08/28/2014  PCP: [name], MD  Admit date: 08/28/2014 Discharge date: 08/30/2014  Recommendations for Outpatient Follow-up:  1. Hypokalemia, consider repeat BMP 2. DM. Off metformin secondary to diarrhea. Follow CBG 3.  (include homehealth, outpatient follow-up instructions, specific recommendations for PCP to follow-up on, etc.)  Follow-up Information    Follow up with [name], MD.   Specialty:  Family Medicine   Why:  As needed   Contact information:   [place] [address] [phone]      Discharge Diagnoses:  1. Profound symptomatic hypokalemia, secondary to diarrhea from metformin complicated by HCTZ 2. Diarrhea secondary to metformin 3. Prolonged QTc secondary to hypoglycemia  Discharge Condition: improved Disposition: home  Diet recommendation: diabetic diet  Filed Weights   08/28/14 1233 08/29/14 0500 08/29/14 1942  Weight: 65.5 kg (144 lb 6.4 oz) 68 kg (149 lb 14.6 oz) 70.171 kg (154 lb 11.2 oz)    History of present illness:  64 y/o women sent to ED by PCP for hypokalemia. Thought to be complicated by diarrhea secondary to Metformin.  Hospital Course:  Admitted to SDU for profound hypokalemia and prolonged QT. Aggressively replace potassium and magnesium with gradual improvement and resolution of QTc prolongation. Diarrhea resolved off metformin. AKI resolved with IV fluids. Hospitalization was uncomplicated.   Individual issues as below:  1. Profound symptomatic hypokalemia, secondary to diarrhea from metformin complicated by HCTZ. Potassium near normal.  2. Diarrhea. Resolved. Secondary to Metformin. No fever, abdominal pain, or signs of intraabdominal process. cdiff was negative. 3. AKI secondary to dehydration from diarrhea. Resolved.  4. AG metabolic acidosis, consider "euglycemic" DKA secondary to Invokana, although bicarb was high.  Resolved with fluids. 5. Hypomagnesia.Further repletion today. 6. Prolonged QT secondary to hypokalemia. Prolonged QT has normalized consistent with near normal potassium.   Discharge Instructions  Discharge Instructions    Diet Carb Modified    Complete by:  As directed      Diet general    Complete by:  As directed      Discharge instructions    Complete by:  As directed   Call your physician or seek immediate medical attention for diarrhea, vomiting, can't eat, low or high blood sugars or worsening of condition.     Increase activity slowly    Complete by:  As directed           Current Discharge Medication List    START taking these medications   Details  potassium chloride SA (K-DUR,KLOR-CON) 20 MEQ tablet Take 2 tablets (40 mEq total) by mouth 2 (two) times daily. Qty: 10 tablet, Refills: 0      CONTINUE these medications which have CHANGED   Details  buPROPion (WELLBUTRIN SR) 150 MG 12 hr tablet Take 1 tablet (150 mg total) by mouth daily.    Insulin Glargine (LANTUS SOLOSTAR) 100 UNIT/ML Solostar Pen Inject 40 Units (0.4 mLs) into the skin at  bedtime.      CONTINUE these medications which have NOT CHANGED   Details  acetaminophen (TYLENOL) 500 MG tablet Take 1,000 mg by mouth every 6 (six) hours as needed. For pain     aspirin EC 81 MG tablet Take 81 mg by mouth daily.      CRESTOR 20 MG tablet Take 1 tablet by mouth  every night at bedtime Qty: 90 tablet, Refills: 1    etodolac (LODINE) 400 MG tablet TAKE 1 TABLET (  400 MG TOTAL) BY MOUTH 2 (TWO) TIMES DAILY. TAKE WITH FOOD Qty: 60 tablet, Refills: 2    hydrochlorothiazide (MICROZIDE) 12.5 MG capsule Take 1 capsule by mouth  daily Qty: 90 capsule, Refills: 1    INVOKANA 100 MG TABS tablet Take 1 tablet by mouth  daily Qty: 90 tablet, Refills: 1    levothyroxine (SYNTHROID, LEVOTHROID) 75 MCG tablet Take 1 tablet by mouth  daily before breakfast Qty: 90 tablet, Refills: 1    meclizine (ANTIVERT) 25 MG  tablet TAKE 1 TABLET (25 MG TOTAL) BY MOUTH EVERY 6 (SIX) HOURS AS NEEDED FOR DIZZINESS OR NAUSEA. Qty: 30 tablet, Refills: 3    nitroGLYCERIN (NITROSTAT) 0.4 MG SL tablet Place 0.4 mg under the tongue every 5 (five) minutes as needed. For chest pains    oxybutynin (DITROPAN-XL) 10 MG 24 hr tablet Take 2 tablets by mouth  every day Qty: 180 tablet, Refills: 0    B-D INS SYRINGE 0.5CC/31GX5/16 31G X 5/16" 0.5 ML MISC USE AS DIRECTED (WITH LANTUS INSULIN) Qty: 100 each, Refills: 2    ONETOUCH VERIO test strip       STOP taking these medications     metFORMIN (GLUCOPHAGE) 500 MG tablet        Allergies  Allergen Reactions  . Altace [Ramipril] Cough  . Biaxin [Clarithromycin]     Fatigue  . Niacin And Related Other (See Comments)    Flush - felt like on fire.    The results of significant diagnostics from this hospitalization (including imaging, microbiology, ancillary and laboratory) are listed below for reference.    Significant Diagnostic Studies: No results found.  Microbiology: Recent Results (from the past 240 hour(s))  MRSA PCR Screening     Status: None   Collection Time: 08/28/14 12:36 PM  Result Value Ref Range Status   MRSA by PCR NEGATIVE NEGATIVE Final    Comment:        The GeneXpert MRSA Assay (FDA approved for NASAL specimens only), is one component of a comprehensive MRSA colonization surveillance program. It is not intended to diagnose MRSA infection nor to guide or monitor treatment for MRSA infections.   Clostridium Difficile by PCR (not at [facility])     Status: None   Collection Time: 08/28/14  3:38 PM  Result Value Ref Range Status   C difficile by pcr NEGATIVE NEGATIVE Final     Labs: Basic Metabolic Panel:  Recent Labs Lab 08/27/14 0936  08/28/14 1402 08/28/14 1813 08/28/14 1821 08/29/14 0557 08/29/14 0844 08/29/14 1604 08/30/14 0614  NA 145*  < > 141 141  --  142 141 139 140  K 1.9*  < > <2.0* <2.0*  --  2.0* 2.3* 2.5* 3.1*  CL  88*  < > 94* 95*  --  102 100* 101 106  CO2 38*  < > 38* 32  --  32 31 29 25   GLUCOSE 125*  < > 101* 134*  --  145* 147* 296* 180*  BUN 6*  < > 5* 5*  --  <5* <5* <5* <5*  CREATININE 1.18*  < > 1.00 1.01*  --  0.95 0.95 0.99 0.87  CALCIUM 9.3  < > 7.5* 7.9*  --  7.2* 7.4* 7.6* 8.0*  MG 1.6  --   --   --   --  1.7  --   --  1.7  PHOS  --   --  2.3*  --  1.9* 2.3*  --   --   --   < > =  values in this interval not displayed. Liver Function Tests:  Recent Labs Lab 08/27/14 0936  AST 40  ALT 17  ALKPHOS 80  BILITOT 0.3  PROT 7.2   CBC:  Recent Labs Lab 08/28/14 1005  WBC 8.0  NEUTROABS 4.7  HGB 10.5*  HCT 33.4*  MCV 82.9  PLT 383    CBG:  Recent Labs Lab 08/28/14 2011 08/29/14 0734 08/29/14 1111 08/29/14 1623 08/30/14 0721  GLUCAP 179* 122* 197* 294* 170*    Principal Problem:   Hypokalemia Active Problems:   Diabetes   AKI (acute kidney injury)   Diarrhea   High anion gap metabolic acidosis   Time coordinating discharge: 35 minutes.  Signed:  Murray Hodgkins, MD [facility] 08/30/2014, 9:07 AM   I, Laban Emperor. Leonie Green, am scribing for Dr. Melene Plan. Sarajane Jews. MD on 08/30/2014 at 9:08 AM   I have reviewed the above documentation for accuracy and completeness, and I agree with the above. Murray Hodgkins, MD

## 2014-08-30 NOTE — Progress Notes (Signed)
PROGRESS NOTE  Rachel [name] JEH:[id number] DOB: 1950/08/24 DOA: 08/28/2014 PCP: [name], MD  Summary: 64 y/o women sent to ED by PCP for hypokalemia. Thought to be complicated by diarrhea secondary to Metformin.  Assessment/Plan: 1. Profound symptomatic hypokalemia, secondary to diarrhea from metformin complicated by HCTZ. Potassium near normal.  2. Diarrhea. Resolved. Secondary to Metformin. No fever, abdominal pain, or signs of intraabdominal process. cdiff was negative. 3. AKI secondary to dehydration from diarrhea. Resolved.  4. AG metabolic acidosis, consider "euglycemic" DKA secondary to Invokana, although bicarb was high. Resolved with fluids. 5. Hypomagnesia.Further repletion today. 6. Prolonged QT secondary to hypokalemia. Prolonged QT has normalized consistent with near normal potassium.     Discharge today   Code Status: Full DVT prophylaxis: SCDs  Family Communication: Discussed care plan and discharge with patient. She has no concerns at this time.- 08/30/14 Disposition Plan: Discharge home.  Murray Hodgkins, MD  [facility]  Pager [phone] If 7PM-7AM, please contact night-coverage at [email or web address], password [facility] 08/30/2014, 6:40 AM  LOS: 2 days   Consultants:    Procedures:    Antibiotics:    HPI/Subjective: Pt reports feeling better, but has mild soreness of calves. She was able to sleep last night and had no difficultly eating. Denies any diarrhea, chest pain, or shortness of breath.   Objective: Filed Vitals:   08/29/14 1600 08/29/14 1942 08/29/14 2243 08/30/14 0510  BP: 127/71 142/72 135/75 165/79  Pulse: 79 76 85 87  Temp:  98.4 F (36.9 C) 99 F (37.2 C) 98.8 F (37.1 C)  TempSrc:  Oral Oral Oral  Resp: 21 20 20 20   Height:  5\' 3"  (1.6 m)    Weight:  70.171 kg (154 lb 11.2 oz)    SpO2: 100% 100% 98% 100%    Intake/Output Summary (Last 24 hours) at 08/30/14 0640 Last data filed at 08/30/14 0214  Gross per 24 hour    Intake    880 ml  Output   1875 ml  Net   -995 ml     Filed Weights   08/28/14 1233 08/29/14 0500 08/29/14 1942  Weight: 65.5 kg (144 lb 6.4 oz) 68 kg (149 lb 14.6 oz) 70.171 kg (154 lb 11.2 oz)    Exam:     Afebrile. VSS.  General:  Appears comfortable, calm. Cardiovascular: Regular rate and rhythm, no murmur, rub or gallop. No lower extremity edema. Telemetry: Sinus rhythm, no arrhythmias  Respiratory: Clear to auscultation bilaterally, no wheezes, rales or rhonchi. Normal respiratory effort. Musculoskeletal: grossly normal tone bilateral upper and lower extremities Psychiatric: grossly normal mood and affect, speech fluent and appropriate Neurologic: grossly non-focal..  New data reviewed:  Repeat EKG independently reviewed sinus rhythm no acute changes QT normalized.  Pertinent data since admission:    Pending data:     Scheduled Meds: . aspirin EC  81 mg Oral Daily  . levothyroxine  75 mcg Oral QAC breakfast  . oxybutynin  10 mg Oral Daily  . rosuvastatin  20 mg Oral q1800   Continuous Infusions:    Principal Problem:   Hypokalemia Active Problems:   Diabetes   AKI (acute kidney injury)   Diarrhea   High anion gap metabolic acidosis    I, Rachel [name], am scribing for Dr. Melene Plan. Sarajane Jews. MD on 08/30/2014 at 6:40 AM  I have reviewed the above documentation for accuracy and completeness, and I agree with the above. 08/30/2014 Murray Hodgkins, MD

## 2014-08-31 ENCOUNTER — Other Ambulatory Visit: Payer: Self-pay | Admitting: Family Medicine

## 2014-09-02 ENCOUNTER — Ambulatory Visit (HOSPITAL_BASED_OUTPATIENT_CLINIC_OR_DEPARTMENT_OTHER)
Admission: RE | Admit: 2014-09-02 | Discharge: 2014-09-02 | Disposition: A | Discharge status: Home / Self Care | Payer: 59 | Source: Ambulatory Visit | Attending: Cardiovascular Disease | Admitting: Cardiovascular Disease

## 2014-09-02 ENCOUNTER — Ambulatory Visit (HOSPITAL_COMMUNITY)
Admission: RE | Admit: 2014-09-02 | Discharge: 2014-09-02 | Disposition: A | Discharge status: Home / Self Care | Payer: 59 | Source: Ambulatory Visit | Attending: Cardiovascular Disease | Admitting: Cardiovascular Disease

## 2014-09-02 DIAGNOSIS — I739 Peripheral vascular disease, unspecified: Secondary | ICD-10-CM

## 2014-09-02 DIAGNOSIS — I2510 Atherosclerotic heart disease of native coronary artery without angina pectoris: Secondary | ICD-10-CM | POA: Insufficient documentation

## 2014-09-02 DIAGNOSIS — I10 Essential (primary) hypertension: Secondary | ICD-10-CM | POA: Insufficient documentation

## 2014-09-02 DIAGNOSIS — I6523 Occlusion and stenosis of bilateral carotid arteries: Secondary | ICD-10-CM | POA: Diagnosis not present

## 2014-09-02 DIAGNOSIS — E785 Hyperlipidemia, unspecified: Secondary | ICD-10-CM | POA: Diagnosis not present

## 2014-09-02 DIAGNOSIS — I70203 Unspecified atherosclerosis of native arteries of extremities, bilateral legs: Secondary | ICD-10-CM | POA: Insufficient documentation

## 2014-09-08 ENCOUNTER — Encounter: Payer: Self-pay | Admitting: *Deleted

## 2014-09-09 ENCOUNTER — Telehealth: Payer: Self-pay | Admitting: Cardiovascular Disease

## 2014-09-09 NOTE — Telephone Encounter (Signed)
lmtc

## 2014-09-09 NOTE — Telephone Encounter (Signed)
Noted that letter was sent to patient for her echo results.  So not see when next appointment is to follow up on doppler.  Will send to Dellie Catholic RN re appointment to talk with provider.  I called her echo results this morning per patient request

## 2014-09-09 NOTE — Telephone Encounter (Signed)
Pt is returning Kathryn's call in regards to some results. Please call back  Thanks

## 2014-09-09 NOTE — Telephone Encounter (Signed)
Pt called in stating that she had a carotid and LE doppler on 7/27 and wanted to know if the results were available. Please cal  Thanks

## 2014-09-09 NOTE — Telephone Encounter (Signed)
I spoke with patient and gave her the lower extremity doppler report.  She wants to delay the angiogram until October, when she retires.  I scheduled her follow up for Sept.  She has no open wounds.

## 2014-09-10 ENCOUNTER — Telehealth: Payer: Self-pay | Admitting: Cardiovascular Disease

## 2014-09-10 NOTE — Telephone Encounter (Signed)
[name] is wanting to speak with you in reference to the conversation from yesterday . Please call   Thanks

## 2014-09-10 NOTE — Telephone Encounter (Signed)
I spoke with patient.  She changed her mind.  She wants to proceed with angio in the near future.  Appt made for 8/5 with Dr Gwenlyn Found.

## 2014-09-11 ENCOUNTER — Ambulatory Visit (INDEPENDENT_AMBULATORY_CARE_PROVIDER_SITE_OTHER): Payer: 59 | Admitting: Cardiovascular Disease

## 2014-09-11 ENCOUNTER — Encounter: Payer: Self-pay | Admitting: Cardiovascular Disease

## 2014-09-11 ENCOUNTER — Telehealth: Payer: Self-pay | Admitting: Cardiovascular Disease

## 2014-09-11 VITALS — BP 138/86 | HR 96 | Ht 63.0 in | Wt 145.3 lb

## 2014-09-11 DIAGNOSIS — Z720 Tobacco use: Secondary | ICD-10-CM

## 2014-09-11 DIAGNOSIS — D689 Coagulation defect, unspecified: Secondary | ICD-10-CM | POA: Diagnosis not present

## 2014-09-11 DIAGNOSIS — Z79899 Other long term (current) drug therapy: Secondary | ICD-10-CM

## 2014-09-11 DIAGNOSIS — I739 Peripheral vascular disease, unspecified: Secondary | ICD-10-CM

## 2014-09-11 NOTE — Telephone Encounter (Signed)
Spoke with Randall Hiss at 5:12 pm regarding procedure at [facility], Monday 09/28/14 @ 7:30 am.  He voiced his understanding.

## 2014-09-11 NOTE — Assessment & Plan Note (Addendum)
Rachel [name] returns today for follow-up of her Doppler studies. 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 With agree to proceed with angiography and potential intervention for lifestyle limiting claudication. This was performed on August 22.

## 2014-09-11 NOTE — Patient Instructions (Signed)
Dr. Gwenlyn Found has ordered a peripheral angiogram to be done at [facility].  This procedure is going to look at the bloodflow in your lower extremities.  If Dr. Gwenlyn Found is able to open up the arteries, you will have to spend one night in the hospital.  If he is not able to open the arteries, you will be able to go home that same day.    After the procedure, you will not be allowed to drive for 3 days or push, pull, or lift anything greater than 10 lbs for one week.    You will be required to have the following tests prior to the procedure:  1. Blood work-the blood work can be done no more than 7 days prior to the procedure.  It can be done at any [facility] lab.  There is one downstairs on the first floor of this building and one in the [facility] building [phone] [address])  2. Chest Xray-the chest xray order has already been placed at the [place].     *REPS Randall Hiss

## 2014-09-11 NOTE — Progress Notes (Signed)
Rachel [name] returns today for follow-up of her Doppler tests. 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 Her carotid Dopplers revealed mild to moderate bilateral ICA stenosis as well. I'm going to proceed with outpatient abdominal aortography with bifemoral runoff and potential endovascular treatment of her iliac artery disease. I thoroughly discussed the procedure with the patient she agrees to proceed.

## 2014-09-14 ENCOUNTER — Other Ambulatory Visit: Payer: Self-pay | Admitting: *Deleted

## 2014-09-14 ENCOUNTER — Telehealth: Payer: Self-pay | Admitting: Cardiovascular Disease

## 2014-09-14 ENCOUNTER — Telehealth: Payer: Self-pay | Admitting: *Deleted

## 2014-09-14 DIAGNOSIS — Z720 Tobacco use: Secondary | ICD-10-CM

## 2014-09-14 NOTE — Telephone Encounter (Signed)
Left message regarding procedure time and date---instructed patient to call back regarding further instructions.

## 2014-09-14 NOTE — Telephone Encounter (Signed)
Patient returned my call regarding her procedure for 09/28/14.  I read the instructions and gave dates for lab work and chest x-ray.  Patient states she will have labs and x-ray done in [place].  I will mail copy of instructions to patient---she voiced her understanding.

## 2014-09-16 ENCOUNTER — Ambulatory Visit (INDEPENDENT_AMBULATORY_CARE_PROVIDER_SITE_OTHER): Payer: 59 | Admitting: Family Medicine

## 2014-09-16 ENCOUNTER — Other Ambulatory Visit: Payer: Self-pay | Admitting: Family Medicine

## 2014-09-16 ENCOUNTER — Encounter: Payer: Self-pay | Admitting: Family Medicine

## 2014-09-16 VITALS — BP 122/82 | Ht 63.75 in | Wt 147.6 lb

## 2014-09-16 DIAGNOSIS — E876 Hypokalemia: Secondary | ICD-10-CM | POA: Diagnosis not present

## 2014-09-16 MED ORDER — POTASSIUM CHLORIDE CRYS ER 20 MEQ PO TBCR: 20.0000 meq | EXTENDED_RELEASE_TABLET | Freq: Two times a day (BID) | ORAL | Status: DC

## 2014-09-16 MED ORDER — ETODOLAC 400 MG PO TABS: 400.0000 mg | ORAL_TABLET | Freq: Two times a day (BID) | ORAL | Status: DC | PRN

## 2014-09-16 MED ORDER — METFORMIN HCL 500 MG PO TABS: 500.0000 mg | ORAL_TABLET | Freq: Two times a day (BID) | ORAL | Status: DC

## 2014-09-16 MED ORDER — AMOXICILLIN 500 MG PO CAPS: 500.0000 mg | ORAL_CAPSULE | Freq: Three times a day (TID) | ORAL | Status: DC

## 2014-09-16 NOTE — Progress Notes (Signed)
   Subjective:    Patient ID: Rachel [name], female    DOB: August 15, 1950, 64 y.o.   MRN: [id number]  HPI Patient arrives for a follow up for recent hospitalization for hypokalemia. Patient states she has finished potassium pills and no longer on any potassium. Hospital also stopped metformin and she wants 2nd opinion on this.  patient states she is had intermittent diarrhea it was thought that it might be due to the large dose of metformin she was on she was never able to use the lower dose because she ended up in the hospital with low potassium Review of Systems  patient denies weakness dizziness nausea vomiting or diarrhea.    Objective:   Physical Exam   lungs are clear hearts regular pulse normal BP good extremities no edema      Assessment & Plan:   reinitiate potassium 20 mEq twice a day   patient to get lab work done we will follow up on that at the end of the week   patient Edison Pace gradually restart metformin. I recommend that she stop it if she starts having diarrhea she understands this.  Blood pressure good control follow-up in the fall for comprehensive diabetic check up

## 2014-09-17 ENCOUNTER — Encounter: Payer: Self-pay | Admitting: *Deleted

## 2014-09-17 LAB — CBC
HEMATOCRIT: 33.7 % — AB (ref 36.0–46.0)
Hemoglobin: 10.4 g/dL — ABNORMAL LOW (ref 12.0–15.0)
MCH: 25.4 pg — ABNORMAL LOW (ref 26.0–34.0)
MCHC: 30.9 g/dL (ref 30.0–36.0)
MCV: 82.4 fL (ref 78.0–100.0)
MPV: 9.6 fL (ref 8.6–12.4)
Platelets: 338 10*3/uL (ref 150–400)
RBC: 4.09 MIL/uL (ref 3.87–5.11)
RDW: 16.6 % — ABNORMAL HIGH (ref 11.5–15.5)
WBC: 7.7 10*3/uL (ref 4.0–10.5)

## 2014-09-17 LAB — BASIC METABOLIC PANEL
BUN: 12 mg/dL (ref 7–25)
CALCIUM: 9.2 mg/dL (ref 8.6–10.4)
CHLORIDE: 105 mmol/L (ref 98–110)
CO2: 23 mmol/L (ref 20–31)
CREATININE: 1.04 mg/dL — AB (ref 0.50–0.99)
GLUCOSE: 318 mg/dL — AB (ref 65–99)
Potassium: 4.5 mmol/L (ref 3.5–5.3)
Sodium: 139 mmol/L (ref 135–146)

## 2014-09-17 LAB — APTT: aPTT: 25 seconds (ref 24–37)

## 2014-09-17 LAB — PROTIME-INR
INR: 1.06 (ref <1.50)
PROTHROMBIN TIME: 13.8 s (ref 11.6–15.2)

## 2014-09-18 ENCOUNTER — Telehealth: Payer: Self-pay | Admitting: Family Medicine

## 2014-09-18 ENCOUNTER — Telehealth: Payer: Self-pay | Admitting: Cardiovascular Disease

## 2014-09-18 ENCOUNTER — Other Ambulatory Visit: Payer: Self-pay | Admitting: Family Medicine

## 2014-09-18 DIAGNOSIS — Z79899 Other long term (current) drug therapy: Secondary | ICD-10-CM

## 2014-09-18 NOTE — Telephone Encounter (Signed)
Would you like for patient to repeat Met7 in a month.

## 2014-09-18 NOTE — Telephone Encounter (Signed)
Notified patient blood work has been ordered. Potassium was refilled on  8/10.

## 2014-09-18 NOTE — Telephone Encounter (Signed)
Pt is requesting a refill on her potassium   [facility]

## 2014-09-18 NOTE — Telephone Encounter (Signed)
Patient brought FMLA [name]) paperwork on 09/16/14.  Had originally only signed ROI.  On 09/16/14 brought remainder of paperwork needed to process.   Sent FMLA paperwork to [employer] [place] for processing on 09/17/14. lp

## 2014-09-18 NOTE — Telephone Encounter (Signed)
Repeat metabolic 7 in 3-4 weeks

## 2014-09-19 ENCOUNTER — Other Ambulatory Visit: Payer: Self-pay | Admitting: Family Medicine

## 2014-09-19 MED ORDER — POTASSIUM CHLORIDE CRYS ER 20 MEQ PO TBCR: 20.0000 meq | EXTENDED_RELEASE_TABLET | Freq: Two times a day (BID) | ORAL | Status: DC

## 2014-09-19 NOTE — Progress Notes (Signed)
Pt stated pharmacy did not get rx that was sent 8/10, therefore sent again AND pharmacist spoken with personally

## 2014-09-22 ENCOUNTER — Ambulatory Visit: Payer: 59 | Admitting: Cardiovascular Disease

## 2014-09-23 ENCOUNTER — Telehealth: Payer: Self-pay | Admitting: Family Medicine

## 2014-09-23 MED ORDER — INSULIN GLARGINE 100 UNIT/ML SOLOSTAR PEN: PEN_INJECTOR | SUBCUTANEOUS | Status: DC

## 2014-09-23 MED ORDER — CANAGLIFLOZIN 100 MG PO TABS: ORAL_TABLET | ORAL | Status: DC

## 2014-09-23 NOTE — Telephone Encounter (Signed)
Stop metformin, incr invokana to two 100 mg per d, add eight units lantus to dose

## 2014-09-23 NOTE — Telephone Encounter (Signed)
Spoke with patient and informed her per Dr.Steve to stop metformin and increase invokana to two 100mg  per day, and add eight units of lantus to dose. Patient verbalized understanding. Changes made in epic.

## 2014-09-23 NOTE — Telephone Encounter (Signed)
Patient states that she is taking 1 tab po BID of metformin. She states that she is experiencing diarrhea daily. Wants to know does this need to be decreased or what do you recommend?

## 2014-09-23 NOTE — Telephone Encounter (Signed)
Pt states the diarrhea has come back from the metformin. Pt would like for a nurse to call her regarding this.

## 2014-09-28 ENCOUNTER — Ambulatory Visit (HOSPITAL_COMMUNITY)
Admission: RE | Admit: 2014-09-28 | Discharge: 2014-09-28 | Disposition: A | Discharge status: Home / Self Care | Payer: 59 | Source: Ambulatory Visit | Attending: Cardiovascular Disease | Admitting: Cardiovascular Disease

## 2014-09-28 ENCOUNTER — Encounter (HOSPITAL_COMMUNITY)
Admission: RE | Disposition: A | Discharge status: Home / Self Care | Payer: 59 | Source: Ambulatory Visit | Attending: Cardiovascular Disease

## 2014-09-28 ENCOUNTER — Encounter (HOSPITAL_COMMUNITY): Payer: Self-pay | Admitting: Cardiovascular Disease

## 2014-09-28 DIAGNOSIS — Z720 Tobacco use: Secondary | ICD-10-CM

## 2014-09-28 DIAGNOSIS — I708 Atherosclerosis of other arteries: Secondary | ICD-10-CM | POA: Insufficient documentation

## 2014-09-28 DIAGNOSIS — Z79899 Other long term (current) drug therapy: Secondary | ICD-10-CM

## 2014-09-28 DIAGNOSIS — I739 Peripheral vascular disease, unspecified: Secondary | ICD-10-CM | POA: Insufficient documentation

## 2014-09-28 DIAGNOSIS — D689 Coagulation defect, unspecified: Secondary | ICD-10-CM

## 2014-09-28 DIAGNOSIS — I2510 Atherosclerotic heart disease of native coronary artery without angina pectoris: Secondary | ICD-10-CM | POA: Insufficient documentation

## 2014-09-28 DIAGNOSIS — I70213 Atherosclerosis of native arteries of extremities with intermittent claudication, bilateral legs: Secondary | ICD-10-CM | POA: Diagnosis not present

## 2014-09-28 HISTORY — PX: PERIPHERAL VASCULAR CATHETERIZATION: SHX172C

## 2014-09-28 LAB — GLUCOSE, CAPILLARY
GLUCOSE-CAPILLARY: 147 mg/dL — AB (ref 65–99)
Glucose-Capillary: 171 mg/dL — ABNORMAL HIGH (ref 65–99)

## 2014-09-28 SURGERY — LOWER EXTREMITY ANGIOGRAPHY

## 2014-09-28 MED ORDER — ACETAMINOPHEN 325 MG PO TABS: 650.0000 mg | ORAL_TABLET | ORAL | Status: DC | PRN

## 2014-09-28 MED ORDER — LIDOCAINE HCL (PF) 1 % IJ SOLN
INTRAMUSCULAR | Status: AC
  Filled 2014-09-28: qty 30

## 2014-09-28 MED ORDER — ONDANSETRON HCL 4 MG/2ML IJ SOLN: 4.0000 mg | Freq: Four times a day (QID) | INTRAMUSCULAR | Status: DC | PRN

## 2014-09-28 MED ORDER — SODIUM CHLORIDE 0.9 % IJ SOLN: 3.0000 mL | INTRAMUSCULAR | Status: DC | PRN

## 2014-09-28 MED ORDER — MIDAZOLAM HCL 2 MG/2ML IJ SOLN
INTRAMUSCULAR | Status: DC | PRN
  Administered 2014-09-28: 1 mg via INTRAVENOUS

## 2014-09-28 MED ORDER — FENTANYL CITRATE (PF) 100 MCG/2ML IJ SOLN
INTRAMUSCULAR | Status: DC | PRN
  Administered 2014-09-28: 25 ug via INTRAVENOUS

## 2014-09-28 MED ORDER — SODIUM CHLORIDE 0.9 % WEIGHT BASED INFUSION
3.0000 mL/kg/h | INTRAVENOUS | Status: AC
  Administered 2014-09-28: 3 mL/kg/h via INTRAVENOUS

## 2014-09-28 MED ORDER — ASPIRIN 81 MG PO CHEW
CHEWABLE_TABLET | ORAL | Status: DC
Start: 2014-09-28 — End: 2014-09-28
  Filled 2014-09-28: qty 1

## 2014-09-28 MED ORDER — FENTANYL CITRATE (PF) 100 MCG/2ML IJ SOLN
INTRAMUSCULAR | Status: AC
  Filled 2014-09-28: qty 4

## 2014-09-28 MED ORDER — SODIUM CHLORIDE 0.9 % WEIGHT BASED INFUSION: 1.0000 mL/kg/h | INTRAVENOUS | Status: DC

## 2014-09-28 MED ORDER — SODIUM CHLORIDE 0.9 % IV SOLN: INTRAVENOUS | Status: AC

## 2014-09-28 MED ORDER — NITROGLYCERIN 1 MG/10 ML FOR IR/CATH LAB
INTRA_ARTERIAL | Status: DC | PRN
  Administered 2014-09-28: 25 mL

## 2014-09-28 MED ORDER — MIDAZOLAM HCL 2 MG/2ML IJ SOLN
INTRAMUSCULAR | Status: AC
  Filled 2014-09-28: qty 4

## 2014-09-28 MED ORDER — HEPARIN (PORCINE) IN NACL 2-0.9 UNIT/ML-% IJ SOLN
INTRAMUSCULAR | Status: AC
Start: 2014-09-28 — End: 2014-09-28
  Filled 2014-09-28: qty 1000

## 2014-09-28 MED ORDER — NITROGLYCERIN 1 MG/10 ML FOR IR/CATH LAB
INTRA_ARTERIAL | Status: AC
  Filled 2014-09-28: qty 10

## 2014-09-28 MED ORDER — ASPIRIN 81 MG PO CHEW
81.0000 mg | CHEWABLE_TABLET | ORAL | Status: AC
  Administered 2014-09-28: 81 mg via ORAL

## 2014-09-28 MED ORDER — MORPHINE SULFATE (PF) 2 MG/ML IV SOLN: 2.0000 mg | INTRAVENOUS | Status: DC | PRN

## 2014-09-28 MED ORDER — IODIXANOL 320 MG/ML IV SOLN
INTRAVENOUS | Status: DC | PRN
  Administered 2014-09-28: 137 mL via INTRAVENOUS

## 2014-09-28 SURGICAL SUPPLY — 9 items
CATH ANGIO 5F PIGTAIL 65CM (CATHETERS) ×1 IMPLANT
CATH STRAIGHT 5FR 65CM (CATHETERS) ×1 IMPLANT
KIT PV (KITS) ×3 IMPLANT
SHEATH PINNACLE 5F 10CM (SHEATH) ×1 IMPLANT
SYRINGE MEDRAD AVANTA MACH 7 (SYRINGE) ×1 IMPLANT
TRANSDUCER W/STOPCOCK (MISCELLANEOUS) ×3 IMPLANT
TRAY PV CATH (CUSTOM PROCEDURE TRAY) ×3 IMPLANT
WIRE HITORQ VERSACORE ST 145CM (WIRE) ×1 IMPLANT
WIRE J 3MM .035X145CM (WIRE) ×1 IMPLANT

## 2014-09-28 NOTE — H&P (View-Only) (Signed)
Rachel [name] returns today for follow-up of her Doppler tests. 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 Her carotid Dopplers revealed mild to moderate bilateral ICA stenosis as well. I'm going to proceed with outpatient abdominal aortography with bifemoral runoff and potential endovascular treatment of her iliac artery disease. I thoroughly discussed the procedure with the patient she agrees to proceed.

## 2014-09-28 NOTE — Progress Notes (Signed)
Site area: RFA Site Prior to Removal:  Level 0 Pressure Applied For:32min Manual:   yes Patient Status During Pull:  stable Post Pull Site:  Level 0 Post Pull Instructions Given: yes  Post Pull Pulses Present: palpable Dressing Applied:  clear Bedrest begins @ 0905 till 1305 Comments:

## 2014-09-28 NOTE — Discharge Instructions (Signed)

## 2014-09-28 NOTE — Interval H&P Note (Signed)
History and Physical Interval Note:  09/28/2014 7:34 AM  Rachel [name]  has presented today for surgery, with the diagnosis of claudication  The various methods of treatment have been discussed with the patient and family. After consideration of risks, benefits and other options for treatment, the patient has consented to  Procedure(s): Lower Extremity Angiography (N/A) as a surgical intervention .  The patient's history has been reviewed, patient examined, no change in status, stable for surgery.  I have reviewed the patient's chart and labs.  Questions were answered to the patient's satisfaction.     Quay Burow

## 2014-09-29 ENCOUNTER — Telehealth: Payer: Self-pay | Admitting: Cardiovascular Disease

## 2014-09-29 MED FILL — Lidocaine HCl Local Preservative Free (PF) Inj 1%: INTRAMUSCULAR | Qty: 30 | Status: AC

## 2014-09-29 NOTE — Telephone Encounter (Signed)
Received Attending Physician Statement from [employer] (Via Fax) for Dr Gwenlyn Found to complete and sign.  No Auth or Pmt to [employer].  Sent to [employer] to send letter/packet to patient with instructions. Sent via courier 09/29/14. lp

## 2014-09-30 ENCOUNTER — Telehealth: Payer: Self-pay | Admitting: Cardiovascular Disease

## 2014-09-30 NOTE — Telephone Encounter (Signed)
Received FMLA forms ([employer]) back from [employer] for Dr Gwenlyn Found to review and sign.  FMLA forms given to Marylu Lund, RN for Dr Gwenlyn Found on 09/30/14 for Dr Gwenlyn Found to review and sign. lp

## 2014-10-01 ENCOUNTER — Telehealth: Payer: Self-pay | Admitting: Cardiovascular Disease

## 2014-10-01 NOTE — Telephone Encounter (Signed)
Received signed FMLA form ([employer]) back from Dr Gwenlyn Found.  Notified patient, faxed to [facility] and patient to pick up forms.

## 2014-10-07 ENCOUNTER — Ambulatory Visit (INDEPENDENT_AMBULATORY_CARE_PROVIDER_SITE_OTHER): Payer: 59 | Admitting: Cardiovascular Disease

## 2014-10-07 ENCOUNTER — Encounter: Payer: Self-pay | Admitting: Cardiovascular Disease

## 2014-10-07 VITALS — BP 132/86 | HR 95 | Ht 63.0 in | Wt 150.0 lb

## 2014-10-07 DIAGNOSIS — D689 Coagulation defect, unspecified: Secondary | ICD-10-CM

## 2014-10-07 DIAGNOSIS — Z79899 Other long term (current) drug therapy: Secondary | ICD-10-CM | POA: Diagnosis not present

## 2014-10-07 DIAGNOSIS — I739 Peripheral vascular disease, unspecified: Secondary | ICD-10-CM

## 2014-10-07 LAB — BASIC METABOLIC PANEL
BUN: 12 mg/dL (ref 7–25)
CALCIUM: 10.1 mg/dL (ref 8.6–10.4)
CO2: 24 mmol/L (ref 20–31)
CREATININE: 0.88 mg/dL (ref 0.50–0.99)
Chloride: 103 mmol/L (ref 98–110)
Glucose, Bld: 217 mg/dL — ABNORMAL HIGH (ref 65–99)
Potassium: 4.9 mmol/L (ref 3.5–5.3)
Sodium: 136 mmol/L (ref 135–146)

## 2014-10-07 NOTE — Assessment & Plan Note (Signed)
Rachel [name] underwent peripheral angiography by myself 09/28/14 revealing an occluded left common iliac artery which was calcified fluoroscopically, high-grade right common iliac artery stenosis with a 25 mm gradient and moderate SFA disease. She does have lifestyle limiting claudication. I reviewed her intermittent with Dr. Trula Slade agreed that it was worthwhile to attempt percutaneous revascularization prior to recommending aortobifemoral bypass grafting. I'm scheduling her for that procedure sometime in the next several weeks.

## 2014-10-07 NOTE — Progress Notes (Signed)
Rachel [name] underwent peripheral angiography by myself 09/28/14 revealing an occluded left common iliac artery which was calcified fluoroscopically, high-grade right common iliac artery stenosis with a 25 mm gradient and moderate SFA disease. She does have lifestyle limiting claudication. I reviewed her intermittent with Dr. Trula Slade agreed that it was worthwhile to attempt percutaneous revascularization prior to recommending aortobifemoral bypass grafting. I'm scheduling her for that procedure sometime in the next several weeks.   [name], M.D., [place], [facility], [place] [address] [place],   [id number]  [phone] 10/07/2014 4:12 PM

## 2014-10-07 NOTE — Patient Instructions (Addendum)
Dr. Gwenlyn Found has ordered a peripheral angiogram to be done at [facility].  This procedure is going to look at the bloodflow in your lower extremities.  If Dr. Gwenlyn Found is able to open up the arteries, you will have to spend one night in the hospital.  If he is not able to open the arteries, you will be able to go home that same day.    After the procedure, you will not be allowed to drive for 3 days or push, pull, or lift anything greater than 10 lbs for one week.    You will be required to have the following tests prior to the procedure:  1. Blood work-the blood work can be done no more than 7 days prior to the procedure.  It can be done at any [facility] lab.  There is one downstairs on the first floor of this building and one in the [facility] building [phone] [address])   *REPS Nicki Reaper, Diona Foley Bilateral groin sticks

## 2014-10-08 ENCOUNTER — Telehealth: Payer: Self-pay | Admitting: Cardiovascular Disease

## 2014-10-08 LAB — CBC
HCT: 35 % — ABNORMAL LOW (ref 36.0–46.0)
Hemoglobin: 11 g/dL — ABNORMAL LOW (ref 12.0–15.0)
MCH: 26 pg (ref 26.0–34.0)
MCHC: 31.4 g/dL (ref 30.0–36.0)
MCV: 82.7 fL (ref 78.0–100.0)
MPV: 9.8 fL (ref 8.6–12.4)
PLATELETS: 378 10*3/uL (ref 150–400)
RBC: 4.23 MIL/uL (ref 3.87–5.11)
RDW: 16.9 % — ABNORMAL HIGH (ref 11.5–15.5)
WBC: 9.7 10*3/uL (ref 4.0–10.5)

## 2014-10-08 LAB — PROTIME-INR
INR: 0.96 (ref <1.50)
PROTHROMBIN TIME: 12.9 s (ref 11.6–15.2)

## 2014-10-08 LAB — APTT: APTT: 27 s (ref 24–37)

## 2014-10-08 NOTE — Telephone Encounter (Signed)
Received Attending Physician Statement [employer]) from patient (at her office visit yesterday) for Dr Gwenlyn Found to complete and sign.  Sent Auth, [employer] form and Ck to [employer] @ [place] to process.  Sent via [place] 10/08/14.  Received paperwork after Courier for 10/07/14. lp

## 2014-10-13 ENCOUNTER — Telehealth: Payer: Self-pay | Admitting: Cardiovascular Disease

## 2014-10-13 ENCOUNTER — Encounter: Payer: Self-pay | Admitting: *Deleted

## 2014-10-13 ENCOUNTER — Encounter: Payer: Self-pay | Admitting: Family Medicine

## 2014-10-13 ENCOUNTER — Ambulatory Visit (INDEPENDENT_AMBULATORY_CARE_PROVIDER_SITE_OTHER): Payer: 59 | Admitting: Family Medicine

## 2014-10-13 VITALS — BP 122/80 | Ht 63.0 in | Wt 152.0 lb

## 2014-10-13 DIAGNOSIS — E876 Hypokalemia: Secondary | ICD-10-CM | POA: Diagnosis not present

## 2014-10-13 DIAGNOSIS — R197 Diarrhea, unspecified: Secondary | ICD-10-CM

## 2014-10-13 DIAGNOSIS — E119 Type 2 diabetes mellitus without complications: Secondary | ICD-10-CM

## 2014-10-13 DIAGNOSIS — E0842 Diabetes mellitus due to underlying condition with diabetic polyneuropathy: Secondary | ICD-10-CM

## 2014-10-13 DIAGNOSIS — I739 Peripheral vascular disease, unspecified: Secondary | ICD-10-CM | POA: Diagnosis not present

## 2014-10-13 MED ORDER — METFORMIN HCL 500 MG PO TABS: ORAL_TABLET | ORAL | Status: DC

## 2014-10-13 MED ORDER — INSULIN GLARGINE 100 UNIT/ML SOLOSTAR PEN: PEN_INJECTOR | SUBCUTANEOUS | Status: DC

## 2014-10-13 NOTE — Progress Notes (Signed)
   Subjective:    Patient ID: Rachel [name], female    DOB: January 25, 1951, 64 y.o.   MRN: [id number] Patient arrives office with numerous concerns. Diabetes She presents for her follow-up diabetic visit. She has type 2 diabetes mellitus. There are no hypoglycemic associated symptoms. There are no diabetic associated symptoms. There are no hypoglycemic complications. There are no diabetic complications. There are no known risk factors for coronary artery disease. Current diabetic treatment includes insulin injections. She is compliant with treatment all of the time.   Last A1C was 08/26/14 and it was 5.6. Had to come off the metformin due to diarrhea. When she started back was able to handle low doses but higher doses cause diarrhea again. So she completely stopped. Next  Notes her sugars been up in the past week. Next  Due to have surgery for iliac arterial disease. Asian unfortunately still smoking.  Compliant with Blood pressure medication. Watching salt intake.   Last diabetic eye exam was October 2015. Patient states a spot was seen and she had to go to a specialist.   Due to have surg for obstrctn ot the artery     Review of Systems No headache no chest pain some back pain no abdominal pain no change in bowel habits    Objective:   Physical Exam  Alert vitals stable HET normal. Lungs clear. Heart regular in rhythm. Ankles without edema.      Assessment & Plan:  Impression 1 type 2 diabetes with difficulty with high dose metformin. And numbers not in the best control. #2 peripheral artery disease discussed at length. #3 disability concerns. Patient had numerous questions about the nature of disability now eventually achieve it. She certainly has a lot of medical difficulties which are understandably leading her to this conclusion plan press on with surgery. Exam did reveal no pulses on left and minimal old on right. With slow capillary refill bilateral increase insulin to 62  units. Resume metformin at a lower dose post surgery. Follow-up appearance several months. WSL

## 2014-10-13 NOTE — Telephone Encounter (Signed)
Received Attending Physician Statement ([employer]) back from [facility] @ [facility].  Given to Marylu Lund RN for Dr Gwenlyn Found to complete and sign. lp

## 2014-10-15 ENCOUNTER — Encounter (HOSPITAL_COMMUNITY): Payer: Self-pay | Admitting: Cardiovascular Disease

## 2014-10-15 ENCOUNTER — Encounter (HOSPITAL_COMMUNITY)
Admission: RE | Disposition: A | Discharge status: Home / Self Care | Payer: Self-pay | Source: Ambulatory Visit | Attending: Cardiovascular Disease

## 2014-10-15 ENCOUNTER — Ambulatory Visit (HOSPITAL_COMMUNITY)
Admission: RE | Admit: 2014-10-15 | Discharge: 2014-10-18 | Disposition: A | Discharge status: Home / Self Care | Payer: 59 | Source: Ambulatory Visit | Attending: Cardiovascular Disease | Admitting: Cardiovascular Disease

## 2014-10-15 ENCOUNTER — Telehealth: Payer: Self-pay | Admitting: Cardiovascular Disease

## 2014-10-15 DIAGNOSIS — I70213 Atherosclerosis of native arteries of extremities with intermittent claudication, bilateral legs: Secondary | ICD-10-CM | POA: Diagnosis present

## 2014-10-15 DIAGNOSIS — E119 Type 2 diabetes mellitus without complications: Secondary | ICD-10-CM | POA: Insufficient documentation

## 2014-10-15 DIAGNOSIS — I724 Aneurysm of artery of lower extremity: Secondary | ICD-10-CM | POA: Diagnosis not present

## 2014-10-15 DIAGNOSIS — R0989 Other specified symptoms and signs involving the circulatory and respiratory systems: Secondary | ICD-10-CM

## 2014-10-15 DIAGNOSIS — I729 Aneurysm of unspecified site: Secondary | ICD-10-CM

## 2014-10-15 DIAGNOSIS — I10 Essential (primary) hypertension: Secondary | ICD-10-CM | POA: Diagnosis not present

## 2014-10-15 DIAGNOSIS — I259 Chronic ischemic heart disease, unspecified: Secondary | ICD-10-CM | POA: Diagnosis not present

## 2014-10-15 DIAGNOSIS — T81718A Complication of other artery following a procedure, not elsewhere classified, initial encounter: Secondary | ICD-10-CM

## 2014-10-15 DIAGNOSIS — Z87891 Personal history of nicotine dependence: Secondary | ICD-10-CM | POA: Insufficient documentation

## 2014-10-15 DIAGNOSIS — Z79899 Other long term (current) drug therapy: Secondary | ICD-10-CM

## 2014-10-15 DIAGNOSIS — I739 Peripheral vascular disease, unspecified: Secondary | ICD-10-CM | POA: Diagnosis present

## 2014-10-15 DIAGNOSIS — E785 Hyperlipidemia, unspecified: Secondary | ICD-10-CM | POA: Diagnosis not present

## 2014-10-15 DIAGNOSIS — D689 Coagulation defect, unspecified: Secondary | ICD-10-CM

## 2014-10-15 DIAGNOSIS — D62 Acute posthemorrhagic anemia: Secondary | ICD-10-CM | POA: Diagnosis not present

## 2014-10-15 HISTORY — PX: PERIPHERAL VASCULAR CATHETERIZATION: SHX172C

## 2014-10-15 LAB — POCT ACTIVATED CLOTTING TIME
ACTIVATED CLOTTING TIME: 165 s
Activated Clotting Time: 190 seconds
Activated Clotting Time: 208 seconds
Activated Clotting Time: 220 seconds
Activated Clotting Time: 202 seconds

## 2014-10-15 LAB — GLUCOSE, CAPILLARY
GLUCOSE-CAPILLARY: 137 mg/dL — AB (ref 65–99)
GLUCOSE-CAPILLARY: 131 mg/dL — AB (ref 65–99)
Glucose-Capillary: 133 mg/dL — ABNORMAL HIGH (ref 65–99)

## 2014-10-15 SURGERY — LOWER EXTREMITY ANGIOGRAPHY
Anesthesia: LOCAL

## 2014-10-15 MED ORDER — HYDROCHLOROTHIAZIDE 12.5 MG PO CAPS
12.5000 mg | ORAL_CAPSULE | Freq: Every day | ORAL | Status: DC
  Administered 2014-10-16 – 2014-10-18 (×3): 12.5 mg via ORAL
  Filled 2014-10-15 (×3): qty 1

## 2014-10-15 MED ORDER — ETODOLAC 400 MG PO TABS
400.0000 mg | ORAL_TABLET | Freq: Two times a day (BID) | ORAL | Status: DC | PRN
  Filled 2014-10-15: qty 1

## 2014-10-15 MED ORDER — CEFAZOLIN SODIUM 1-5 GM-% IV SOLN
INTRAVENOUS | Status: AC
  Filled 2014-10-15: qty 50

## 2014-10-15 MED ORDER — SODIUM CHLORIDE 0.9 % WEIGHT BASED INFUSION: 1.0000 mL/kg/h | INTRAVENOUS | Status: DC

## 2014-10-15 MED ORDER — ONDANSETRON HCL 4 MG/2ML IJ SOLN
INTRAMUSCULAR | Status: AC
  Filled 2014-10-15: qty 2

## 2014-10-15 MED ORDER — LIDOCAINE HCL (PF) 1 % IJ SOLN
INTRAMUSCULAR | Status: AC
  Filled 2014-10-15: qty 60

## 2014-10-15 MED ORDER — CLOPIDOGREL BISULFATE 75 MG PO TABS
75.0000 mg | ORAL_TABLET | Freq: Every day | ORAL | Status: DC
  Administered 2014-10-16 – 2014-10-18 (×3): 75 mg via ORAL
  Filled 2014-10-15 (×3): qty 1

## 2014-10-15 MED ORDER — CLOPIDOGREL BISULFATE 75 MG PO TABS
300.0000 mg | ORAL_TABLET | ORAL | Status: AC
  Administered 2014-10-15: 300 mg via ORAL

## 2014-10-15 MED ORDER — BUPROPION HCL ER (SR) 150 MG PO TB12
150.0000 mg | ORAL_TABLET | Freq: Every day | ORAL | Status: DC
  Administered 2014-10-15 – 2014-10-18 (×4): 150 mg via ORAL
  Filled 2014-10-15 (×4): qty 1

## 2014-10-15 MED ORDER — HEPARIN SODIUM (PORCINE) 1000 UNIT/ML IJ SOLN
INTRAMUSCULAR | Status: AC
  Filled 2014-10-15: qty 1

## 2014-10-15 MED ORDER — INSULIN ASPART 100 UNIT/ML ~~LOC~~ SOLN
0.0000 [IU] | Freq: Every day | SUBCUTANEOUS | Status: DC
  Administered 2014-10-17: 2 [IU] via SUBCUTANEOUS

## 2014-10-15 MED ORDER — MIDAZOLAM HCL 2 MG/2ML IJ SOLN
INTRAMUSCULAR | Status: AC
  Filled 2014-10-15: qty 4

## 2014-10-15 MED ORDER — FENTANYL CITRATE (PF) 100 MCG/2ML IJ SOLN
INTRAMUSCULAR | Status: AC
  Filled 2014-10-15: qty 4

## 2014-10-15 MED ORDER — MIDAZOLAM HCL 2 MG/2ML IJ SOLN
INTRAMUSCULAR | Status: DC | PRN
  Administered 2014-10-15 (×2): 1 mg via INTRAVENOUS

## 2014-10-15 MED ORDER — HEPARIN (PORCINE) IN NACL 2-0.9 UNIT/ML-% IJ SOLN
INTRAMUSCULAR | Status: AC
  Filled 2014-10-15: qty 1000

## 2014-10-15 MED ORDER — LEVOTHYROXINE SODIUM 75 MCG PO TABS
75.0000 ug | ORAL_TABLET | Freq: Every day | ORAL | Status: DC
  Administered 2014-10-16 – 2014-10-18 (×3): 75 ug via ORAL
  Filled 2014-10-15 (×4): qty 1

## 2014-10-15 MED ORDER — ACETAMINOPHEN 325 MG PO TABS
650.0000 mg | ORAL_TABLET | ORAL | Status: DC | PRN
  Administered 2014-10-15 – 2014-10-16 (×2): 650 mg via ORAL
  Filled 2014-10-15 (×2): qty 2

## 2014-10-15 MED ORDER — LIVING WELL WITH DIABETES BOOK
Freq: Once | Status: AC
  Administered 2014-10-15
  Filled 2014-10-15: qty 1

## 2014-10-15 MED ORDER — SODIUM CHLORIDE 0.9 % IJ SOLN: 3.0000 mL | INTRAMUSCULAR | Status: DC | PRN

## 2014-10-15 MED ORDER — CLOPIDOGREL BISULFATE 300 MG PO TABS
ORAL_TABLET | ORAL | Status: DC | PRN
  Administered 2014-10-15: 300 mg via ORAL

## 2014-10-15 MED ORDER — ONDANSETRON HCL 4 MG/2ML IJ SOLN: 4.0000 mg | Freq: Four times a day (QID) | INTRAMUSCULAR | Status: DC | PRN

## 2014-10-15 MED ORDER — FENTANYL CITRATE (PF) 100 MCG/2ML IJ SOLN
INTRAMUSCULAR | Status: DC | PRN
  Administered 2014-10-15 (×2): 25 ug via INTRAVENOUS

## 2014-10-15 MED ORDER — ASPIRIN 81 MG PO CHEW
81.0000 mg | CHEWABLE_TABLET | ORAL | Status: AC
  Administered 2014-10-15: 81 mg via ORAL

## 2014-10-15 MED ORDER — LIDOCAINE HCL (PF) 1 % IJ SOLN
INTRAMUSCULAR | Status: DC | PRN
  Administered 2014-10-15: 50 mL

## 2014-10-15 MED ORDER — PROMETHAZINE HCL 25 MG/ML IJ SOLN
12.5000 mg | Freq: Once | INTRAMUSCULAR | Status: DC | PRN
Start: 2014-10-15 — End: 2014-10-18
  Administered 2014-10-15: 17:00:00 12.5 mg via INTRAVENOUS
  Filled 2014-10-15: qty 1

## 2014-10-15 MED ORDER — CEFAZOLIN SODIUM 1-5 GM-% IV SOLN
INTRAVENOUS | Status: DC | PRN
  Administered 2014-10-15: 1 g via INTRAVENOUS

## 2014-10-15 MED ORDER — ONDANSETRON HCL 4 MG/2ML IJ SOLN
INTRAMUSCULAR | Status: DC | PRN
  Administered 2014-10-15: 4 mg via INTRAVENOUS

## 2014-10-15 MED ORDER — CLOPIDOGREL BISULFATE 75 MG PO TABS: 300.0000 mg | ORAL_TABLET | Freq: Every day | ORAL | Status: DC

## 2014-10-15 MED ORDER — HEPARIN SODIUM (PORCINE) 1000 UNIT/ML IJ SOLN
INTRAMUSCULAR | Status: DC | PRN
  Administered 2014-10-15 (×2): 3000 [IU] via INTRAVENOUS
  Administered 2014-10-15: 2500 [IU] via INTRAVENOUS
  Administered 2014-10-15: 5000 [IU] via INTRAVENOUS

## 2014-10-15 MED ORDER — SODIUM CHLORIDE 0.9 % IV SOLN
INTRAVENOUS | Status: AC
  Administered 2014-10-15: 15:00:00 via INTRAVENOUS

## 2014-10-15 MED ORDER — ASPIRIN 81 MG PO CHEW
CHEWABLE_TABLET | ORAL | Status: AC
  Filled 2014-10-15: qty 1

## 2014-10-15 MED ORDER — POTASSIUM CHLORIDE CRYS ER 20 MEQ PO TBCR
20.0000 meq | EXTENDED_RELEASE_TABLET | Freq: Two times a day (BID) | ORAL | Status: DC
  Administered 2014-10-15 – 2014-10-18 (×6): 20 meq via ORAL
  Filled 2014-10-15 (×7): qty 1

## 2014-10-15 MED ORDER — SODIUM CHLORIDE 0.9 % WEIGHT BASED INFUSION
3.0000 mL/kg/h | INTRAVENOUS | Status: DC
  Administered 2014-10-15: 3 mL/kg/h via INTRAVENOUS

## 2014-10-15 MED ORDER — HYDRALAZINE HCL 20 MG/ML IJ SOLN: 10.0000 mg | INTRAMUSCULAR | Status: DC | PRN

## 2014-10-15 MED ORDER — MORPHINE SULFATE (PF) 2 MG/ML IV SOLN
2.0000 mg | INTRAVENOUS | Status: DC | PRN
  Administered 2014-10-15 – 2014-10-17 (×2): 2 mg via INTRAVENOUS
  Filled 2014-10-15 (×2): qty 1

## 2014-10-15 MED ORDER — INSULIN ASPART 100 UNIT/ML ~~LOC~~ SOLN
0.0000 [IU] | Freq: Three times a day (TID) | SUBCUTANEOUS | Status: DC
  Administered 2014-10-16: 5 [IU] via SUBCUTANEOUS
  Administered 2014-10-16 (×2): 3 [IU] via SUBCUTANEOUS
  Administered 2014-10-17: 2 [IU] via SUBCUTANEOUS
  Administered 2014-10-17 (×2): 3 [IU] via SUBCUTANEOUS
  Administered 2014-10-18: 5 [IU] via SUBCUTANEOUS

## 2014-10-15 MED ORDER — ASPIRIN EC 325 MG PO TBEC
325.0000 mg | DELAYED_RELEASE_TABLET | Freq: Every day | ORAL | Status: DC
  Administered 2014-10-16 – 2014-10-18 (×3): 325 mg via ORAL
  Filled 2014-10-15 (×3): qty 1

## 2014-10-15 MED ORDER — ASPIRIN EC 81 MG PO TBEC: 81.0000 mg | DELAYED_RELEASE_TABLET | Freq: Every day | ORAL | Status: DC

## 2014-10-15 SURGICAL SUPPLY — 32 items
BALLN ARMADA 4X100X135 (BALLOONS) ×1 IMPLANT
BALLN ARMADA 6X40X80 (BALLOONS) ×3
BALLOON ARMADA 6X40X80 (BALLOONS) IMPLANT
BALLOON POWERFLX PRO 5X40X80 (BALLOONS) ×1 IMPLANT
BALLOON POWERFLX PRO 7X20X80 (BALLOONS) ×1 IMPLANT
CATH ANGIO 5F PIGTAIL 65CM (CATHETERS) ×1 IMPLANT
CATH CROSS OVER TEMPO 5F (CATHETERS) ×1 IMPLANT
CATH NAVICROSS ANGLED 90CM (MICROCATHETER) ×1 IMPLANT
CATH VIANCE CROSS STAND 150CM (MICROCATHETER) ×3
CATH VIANCE CROSS STD 150CM (MICROCATHETER) IMPLANT
DEVICE CONTINUOUS FLUSH (MISCELLANEOUS) ×2 IMPLANT
DEVICE TORQUE .014-.018 (MISCELLANEOUS) IMPLANT
GLIDEWIRE ANGLED SS 035X260CM (WIRE) ×1 IMPLANT
GUIDEWIRE ASTATO XS 20G 300CM (WIRE) ×1 IMPLANT
KIT ENCORE 26 ADVANTAGE (KITS) ×2 IMPLANT
KIT PV (KITS) ×3 IMPLANT
SHEATH BRITE TIP 7FR 35CM (SHEATH) ×2 IMPLANT
SHEATH PINNACLE 5F 10CM (SHEATH) ×1 IMPLANT
SHEATH PINNACLE 7F 10CM (SHEATH) ×2 IMPLANT
SHEATH PINNACLE 8F 10CM (SHEATH) ×1 IMPLANT
STENT ABSOLUTE PRO 8X60X135 (Permanent Stent) ×1 IMPLANT
STENT ICAST 7X38X120 (Permanent Stent) ×1 IMPLANT
STENT ICAST 7X59X120 (Permanent Stent) ×1 IMPLANT
STOPCOCK MORSE 400PSI 3WAY (MISCELLANEOUS) ×1 IMPLANT
SYRINGE MEDRAD AVANTA MACH 7 (SYRINGE) ×1 IMPLANT
TAPE RADIOPAQUE TURBO (MISCELLANEOUS) ×1 IMPLANT
TORQUE DEVICE .014-.018 (MISCELLANEOUS) ×3
TRANSDUCER W/STOPCOCK (MISCELLANEOUS) ×3 IMPLANT
TRAY PV CATH (CUSTOM PROCEDURE TRAY) ×3 IMPLANT
WIRE EMERALD 3MM-J .035X150CM (WIRE) ×1 IMPLANT
WIRE HITORQ VERSACORE ST 145CM (WIRE) ×2 IMPLANT
WIRE VERSACORE LOC 115CM (WIRE) ×2 IMPLANT

## 2014-10-15 NOTE — Progress Notes (Signed)
Site area: right groin  Site Prior to Removal:  Level 0  Pressure Applied For 20 MINUTES    Minutes Beginning at 1725  Manual:   Yes.    Patient Status During Pull:  stable  Post Pull Groin Site:  Level 0  Post Pull Instructions Given:  Yes.    Post Pull Pulses Present:  Yes.    Dressing Applied:  Yes.    Site area: left groin  Site Prior to Removal:  Level 0  Pressure Applied For 65 MINUTES    Minutes Beginning at 1750  Manual:   Yes.    Patient Status During Pull:  See note below  Post Pull Groin Site:  Level 1  Post Pull Instructions Given:  Yes.    Post Pull Pulses Present:  Yes.    Dressing Applied:  Yes.    Comments:  Patient c/o nausea at 1815 blood pressure 74/59, given 158ml bolus of NS. 1820 blood pressure 103/59. Nausea resolved with increased blood pressure.spondo Hematoma noted lateral to left site when holding pressure. When pressure released hematoma would return. Kerin Ransom PA notified, cath lab called as instructed and site assessed by Suella Broad and Eddie Dibbles. Mickel Baas and Jackelyn Poling agreed the site was stable, soft without hematoma. Pressure dressing applied by Suella Broad. Roselle Zarsona present during assessment and given full report on patient.

## 2014-10-15 NOTE — Telephone Encounter (Signed)
9.7.16 Received signed Attending Physician Statement [employer] [employer]) signed by Dr Gwenlyn Found.  Faxed to [employer] 10/14/14 and notified patient that form was signed and faxed.  She to pick up her copy.  lp

## 2014-10-15 NOTE — Progress Notes (Signed)
Patient was unable to urinate after several attempts on the bedpan. On bedrest post PVI and complaining of lower abdominal pain. Bladder scan showed around 500 mls in bladder. Notified PA through text and inserted foley catheter at 1715 with U/O of 475 mls. Patient reported relief of lower abdominal pain.

## 2014-10-15 NOTE — Interval H&P Note (Signed)
History and Physical Interval Note:  10/15/2014 11:55 AM  Rachel [name]  has presented today for surgery, with the diagnosis of pad  The various methods of treatment have been discussed with the patient and family. After consideration of risks, benefits and other options for treatment, the patient has consented to  Procedure(s): Lower Extremity Angiography (N/A) as a surgical intervention .  The patient's history has been reviewed, patient examined, no change in status, stable for surgery.  I have reviewed the patient's chart and labs.  Questions were answered to the patient's satisfaction.     Quay Burow

## 2014-10-15 NOTE — H&P (View-Only) (Signed)
Mrs. Delauter underwent peripheral angiography by myself 09/28/14 revealing an occluded left common iliac artery which was calcified fluoroscopically, high-grade right common iliac artery stenosis with a 25 mm gradient and moderate SFA disease. She does have lifestyle limiting claudication. I reviewed her intermittent with Dr. Trula Slade agreed that it was worthwhile to attempt percutaneous revascularization prior to recommending aortobifemoral bypass grafting. I'm scheduling her for that procedure sometime in the next several weeks.   [name], M.D., [place], [facility], [place] [address] [place], [place]  [address]  [phone] 10/07/2014 4:12 PM

## 2014-10-16 DIAGNOSIS — I739 Peripheral vascular disease, unspecified: Secondary | ICD-10-CM

## 2014-10-16 DIAGNOSIS — I70213 Atherosclerosis of native arteries of extremities with intermittent claudication, bilateral legs: Secondary | ICD-10-CM | POA: Diagnosis not present

## 2014-10-16 LAB — CBC
HCT: 26 % — ABNORMAL LOW (ref 36.0–46.0)
HEMATOCRIT: 26 % — AB (ref 36.0–46.0)
Hemoglobin: 8.4 g/dL — ABNORMAL LOW (ref 12.0–15.0)
Hemoglobin: 8.4 g/dL — ABNORMAL LOW (ref 12.0–15.0)
MCH: 26.4 pg (ref 26.0–34.0)
MCH: 26.1 pg (ref 26.0–34.0)
MCHC: 32.3 g/dL (ref 30.0–36.0)
MCHC: 32.3 g/dL (ref 30.0–36.0)
MCV: 81.8 fL (ref 78.0–100.0)
MCV: 80.7 fL (ref 78.0–100.0)
PLATELETS: 240 10*3/uL (ref 150–400)
PLATELETS: 242 10*3/uL (ref 150–400)
RBC: 3.18 MIL/uL — ABNORMAL LOW (ref 3.87–5.11)
RBC: 3.22 MIL/uL — AB (ref 3.87–5.11)
RDW: 16.4 % — AB (ref 11.5–15.5)
RDW: 16.2 % — AB (ref 11.5–15.5)
WBC: 9 10*3/uL (ref 4.0–10.5)
WBC: 7.4 10*3/uL (ref 4.0–10.5)

## 2014-10-16 LAB — BASIC METABOLIC PANEL
ANION GAP: 10 (ref 5–15)
BUN: 15 mg/dL (ref 6–20)
CHLORIDE: 104 mmol/L (ref 101–111)
CO2: 18 mmol/L — AB (ref 22–32)
Calcium: 8.6 mg/dL — ABNORMAL LOW (ref 8.9–10.3)
Creatinine, Ser: 0.96 mg/dL (ref 0.44–1.00)
Glucose, Bld: 170 mg/dL — ABNORMAL HIGH (ref 65–99)
POTASSIUM: 4.6 mmol/L (ref 3.5–5.1)
Sodium: 132 mmol/L — ABNORMAL LOW (ref 135–145)

## 2014-10-16 LAB — GLUCOSE, CAPILLARY
GLUCOSE-CAPILLARY: 249 mg/dL — AB (ref 65–99)
Glucose-Capillary: 161 mg/dL — ABNORMAL HIGH (ref 65–99)
Glucose-Capillary: 159 mg/dL — ABNORMAL HIGH (ref 65–99)
Glucose-Capillary: 169 mg/dL — ABNORMAL HIGH (ref 65–99)

## 2014-10-16 NOTE — H&P (Signed)
Subjective:  Feels good this AM. Left groin tender with mild hematoma and ecchymosis  Objective:  Temp:  [98 F (36.7 C)-98.2 F (36.8 C)] 98 F (36.7 C) (09/09 0353) Pulse Rate:  [67-212] 94 (09/09 0353) Resp:  [0-28] 16 (09/09 0353) BP: (74-183)/(49-102) 116/67 mmHg (09/09 0353) SpO2:  [0 %-100 %] 100 % (09/09 0353) Weight:  [140 lb (63.504 kg)-153 lb 14.1 oz (69.8 kg)] 153 lb 14.1 oz (69.8 kg) (09/09 0353) Weight change:   Intake/Output from previous day: 09/08 0701 - 09/09 0700 In: 900 [P.O.:150; I.V.:750] Out: 700 [Urine:700]  Intake/Output from this shift:    Physical Exam: General appearance: alert and no distress Neck: no adenopathy, no carotid bruit, no JVD, supple, symmetrical, trachea midline and thyroid not enlarged, symmetric, no tenderness/mass/nodules Lungs: clear to auscultation bilaterally Heart: regular rate and rhythm, S1, S2 normal, no murmur, click, rub or gallop Extremities: Left groin ecchymosis, small hematoma, palp PP bilat  Lab Results: Results for orders placed or performed during the hospital encounter of 10/15/14 (from the past 48 hour(s))  Glucose, capillary     Status: Abnormal   Collection Time: 10/15/14 10:50 AM  Result Value Ref Range   Glucose-Capillary 137 (H) 65 - 99 mg/dL  POCT Activated clotting time     Status: None   Collection Time: 10/15/14 12:31 PM  Result Value Ref Range   Activated Clotting Time 190 seconds  POCT Activated clotting time     Status: None   Collection Time: 10/15/14 12:42 PM  Result Value Ref Range   Activated Clotting Time 220 seconds  POCT Activated clotting time     Status: None   Collection Time: 10/15/14  1:25 PM  Result Value Ref Range   Activated Clotting Time 208 seconds  POCT Activated clotting time     Status: None   Collection Time: 10/15/14  1:58 PM  Result Value Ref Range   Activated Clotting Time 202 seconds  POCT Activated clotting time     Status: None   Collection Time: 10/15/14   4:04 PM  Result Value Ref Range   Activated Clotting Time 165 seconds  Glucose, capillary     Status: Abnormal   Collection Time: 10/15/14  6:07 PM  Result Value Ref Range   Glucose-Capillary 131 (H) 65 - 99 mg/dL  Glucose, capillary     Status: Abnormal   Collection Time: 10/15/14  9:05 PM  Result Value Ref Range   Glucose-Capillary 133 (H) 65 - 99 mg/dL  Basic metabolic panel      Status: Abnormal   Collection Time: 10/16/14  3:45 AM  Result Value Ref Range   Sodium 132 (L) 135 - 145 mmol/L   Potassium 4.6 3.5 - 5.1 mmol/L   Chloride 104 101 - 111 mmol/L   CO2 18 (L) 22 - 32 mmol/L   Glucose, Bld 170 (H) 65 - 99 mg/dL   BUN 15 6 - 20 mg/dL   Creatinine, Ser 0.96 0.44 - 1.00 mg/dL   Calcium 8.6 (L) 8.9 - 10.3 mg/dL   GFR calc non Af Amer >60 >60 mL/min   GFR calc Af Amer >60 >60 mL/min    Comment: (NOTE) The eGFR has been calculated using the CKD EPI equation. This calculation has not been validated in all clinical situations. eGFR's persistently <60 mL/min signify possible Chronic Kidney Disease.    Anion gap 10 5 - 15  CBC     Status: Abnormal   Collection Time: 10/16/14  3:45 AM  Result Value Ref Range   WBC 9.0 4.0 - 10.5 K/uL   RBC 3.18 (L) 3.87 - 5.11 MIL/uL   Hemoglobin 8.4 (L) 12.0 - 15.0 g/dL   HCT 26.0 (L) 36.0 - 46.0 %   MCV 81.8 78.0 - 100.0 fL   MCH 26.4 26.0 - 34.0 pg   MCHC 32.3 30.0 - 36.0 g/dL   RDW 16.4 (H) 11.5 - 15.5 %   Platelets 240 150 - 400 K/uL  Glucose, capillary     Status: Abnormal   Collection Time: 10/16/14  6:20 AM  Result Value Ref Range   Glucose-Capillary 161 (H) 65 - 99 mg/dL    Imaging: Imaging results have been reviewed  Assessment/Plan:   1. Active Problems: 2.   Peripheral arterial disease 3.   Claudication 4.   Time Spent Directly with Patient:  25 minutes  Length of Stay:   POD #1 Bilat CIA PTA/ Covered stenting with ICast covered stents. Looks good. Mild Left groin hematoma/ ecchymosis. Palp Bilat PP. HGB  dropped 11.0--->> 8.4. Will re check. If stable can be D/Cd home on DAPT. LEA at [facility] next week then ROV with me 2-3 weeks. If HGB drops even lower would get CT abd/Pelvis to R/O RPH.   Quay Burow 10/16/2014, 7:34 AM

## 2014-10-16 NOTE — Progress Notes (Signed)
Pt stated felt something moved on left groin siite, assesed and noted hematoma from incission site to inner groin. Pressure held x 10 mins , hematoma resolved . Site remain level  1 with bruising . Rosaria Ferries PA notified. V/s stable  Monitored pt. Awaiting cbc result.

## 2014-10-17 ENCOUNTER — Ambulatory Visit (HOSPITAL_COMMUNITY): Payer: 59

## 2014-10-17 ENCOUNTER — Ambulatory Visit (HOSPITAL_BASED_OUTPATIENT_CLINIC_OR_DEPARTMENT_OTHER): Payer: 59

## 2014-10-17 DIAGNOSIS — R0989 Other specified symptoms and signs involving the circulatory and respiratory systems: Secondary | ICD-10-CM

## 2014-10-17 DIAGNOSIS — I724 Aneurysm of artery of lower extremity: Secondary | ICD-10-CM

## 2014-10-17 DIAGNOSIS — I70213 Atherosclerosis of native arteries of extremities with intermittent claudication, bilateral legs: Secondary | ICD-10-CM | POA: Diagnosis not present

## 2014-10-17 LAB — BASIC METABOLIC PANEL
Anion gap: 9 (ref 5–15)
BUN: 16 mg/dL (ref 6–20)
CHLORIDE: 101 mmol/L (ref 101–111)
CO2: 22 mmol/L (ref 22–32)
CREATININE: 1.13 mg/dL — AB (ref 0.44–1.00)
Calcium: 9.4 mg/dL (ref 8.9–10.3)
GFR calc Af Amer: 60 mL/min — ABNORMAL LOW (ref >60)
GFR calc non Af Amer: 51 mL/min — ABNORMAL LOW (ref >60)
Glucose, Bld: 184 mg/dL — ABNORMAL HIGH (ref 65–99)
Potassium: 4.5 mmol/L (ref 3.5–5.1)
Sodium: 132 mmol/L — ABNORMAL LOW (ref 135–145)

## 2014-10-17 LAB — TYPE AND SCREEN
ABO/RH(D): O POS
Antibody Screen: NEGATIVE

## 2014-10-17 LAB — GLUCOSE, CAPILLARY
GLUCOSE-CAPILLARY: 171 mg/dL — AB (ref 65–99)
Glucose-Capillary: 158 mg/dL — ABNORMAL HIGH (ref 65–99)
Glucose-Capillary: 177 mg/dL — ABNORMAL HIGH (ref 65–99)
Glucose-Capillary: 210 mg/dL — ABNORMAL HIGH (ref 65–99)

## 2014-10-17 LAB — HEMOGLOBIN AND HEMATOCRIT, BLOOD
HCT: 23.4 % — ABNORMAL LOW (ref 36.0–46.0)
HEMOGLOBIN: 7.5 g/dL — AB (ref 12.0–15.0)

## 2014-10-17 LAB — ABO/RH: ABO/RH(D): O POS

## 2014-10-17 LAB — CBC
HEMATOCRIT: 24.3 % — AB (ref 36.0–46.0)
HEMOGLOBIN: 7.7 g/dL — AB (ref 12.0–15.0)
MCH: 25.6 pg — AB (ref 26.0–34.0)
MCHC: 31.7 g/dL (ref 30.0–36.0)
MCV: 80.7 fL (ref 78.0–100.0)
Platelets: 238 10*3/uL (ref 150–400)
RBC: 3.01 MIL/uL — ABNORMAL LOW (ref 3.87–5.11)
RDW: 16.4 % — ABNORMAL HIGH (ref 11.5–15.5)
WBC: 7.6 10*3/uL (ref 4.0–10.5)

## 2014-10-17 MED ORDER — SODIUM CHLORIDE 0.9 % IV SOLN
Freq: Once | INTRAVENOUS | Status: AC
  Administered 2014-10-17: 10:00:00 500 mL via INTRAVENOUS

## 2014-10-17 MED ORDER — HYDROCODONE-ACETAMINOPHEN 5-325 MG PO TABS
1.0000 | ORAL_TABLET | ORAL | Status: DC | PRN
Start: 2014-10-17 — End: 2014-10-18
  Administered 2014-10-17 – 2014-10-18 (×3): 1 via ORAL
  Filled 2014-10-17 (×3): qty 1

## 2014-10-17 MED ORDER — SODIUM CHLORIDE 0.9 % IV BOLUS (SEPSIS)
500.0000 mL | Freq: Once | INTRAVENOUS | Status: AC
  Administered 2014-10-17: 500 mL via INTRAVENOUS

## 2014-10-17 NOTE — Progress Notes (Addendum)
Notified Dr. Tommi Rumps concerning pt not having home dose of lantus solostar 62 units at bedtime ordered. Pt requested home dose. Awaiting further orders   Rachel [name]  Dr. Tommi Rumps recommended to continue with sliding scale and not implement her home dose until she returns home.

## 2014-10-17 NOTE — Progress Notes (Signed)
Patient Profile: 64 y/o female admitted for LE angiogram for symptomatic PVD. S/P Bilat CIA PTA/ Covered stenting with ICast covered stents. Post cath recovery complicated by left groin hematoma/ pseudoaneurysm and subsequent acute blood loss anemia.   Subjective: Still with left groin tenderness with palpation. Denies low back/ flank pain.   Objective: Vital signs in last 24 hours: Temp:  [97.8 F (36.6 C)-98.5 F (36.9 C)] 98.4 F (36.9 C) (09/10 0745) Pulse Rate:  [99-106] 100 (09/10 0745) Resp:  [15-19] 19 (09/10 0745) BP: (110-136)/(74-80) 136/77 mmHg (09/10 0745) SpO2:  [99 %-100 %] 99 % (09/10 0745) Weight:  [156 lb 12 oz (71.1 kg)] 156 lb 12 oz (71.1 kg) (09/10 0343) Last BM Date: 10/14/14  Intake/Output from previous day: 09/09 0701 - 09/10 0700 In: 960 [P.O.:960] Out: 3150 [Urine:3150] Intake/Output this shift: Total I/O In: 240 [P.O.:240] Out: -   Medications Current Facility-Administered Medications  Medication Dose Route Frequency Provider Last Rate Last Dose  . acetaminophen (TYLENOL) tablet 650 mg  650 mg Oral Q4H PRN [name], MD   650 mg at 10/16/14 2236  . aspirin EC tablet 325 mg  325 mg Oral Daily [name], MD   325 mg at 10/17/14 0936  . buPROPion (WELLBUTRIN SR) 12 hr tablet 150 mg  150 mg Oral Daily [name], MD   150 mg at 10/16/14 1020  . clopidogrel (PLAVIX) tablet 75 mg  75 mg Oral Daily [name], MD   75 mg at 10/17/14 0936  . etodolac (LODINE) tablet 400 mg  400 mg Oral BID PRN [name], MD      . hydrALAZINE (APRESOLINE) injection 10 mg  10 mg Intravenous Q4H PRN [name], MD      . hydrochlorothiazide (MICROZIDE) capsule 12.5 mg  12.5 mg Oral Daily [name], MD   12.5 mg at 10/17/14 0936  . HYDROcodone-acetaminophen (NORCO/VICODIN) 5-325 MG per tablet 1-2 tablet  1-2 tablet Oral Q4H PRN Brittainy Erie Noe, PA-C      . insulin aspart (novoLOG) injection 0-15 Units  0-15 Units  Subcutaneous TID WC Rhonda G Barrett, PA-C   3 Units at 10/17/14 0700  . insulin aspart (novoLOG) injection 0-5 Units  0-5 Units Subcutaneous QHS Rhonda G Barrett, PA-C   0 Units at 10/15/14 2200  . levothyroxine (SYNTHROID, LEVOTHROID) tablet 75 mcg  75 mcg Oral QAC breakfast [name], MD   75 mcg at 10/17/14 0700  . morphine 2 MG/ML injection 2 mg  2 mg Intravenous Q1H PRN [name], MD   2 mg at 10/17/14 0934  . ondansetron (ZOFRAN) injection 4 mg  4 mg Intravenous Q6H PRN [name], MD      . potassium chloride SA (K-DUR,KLOR-CON) CR tablet 20 mEq  20 mEq Oral BID [name], MD   20 mEq at 10/17/14 0920  . promethazine (PHENERGAN) injection 12.5 mg  12.5 mg Intravenous Once PRN Dayna N Dunn, PA-C   12.5 mg at 10/15/14 1707    PE: General appearance: alert, cooperative and no distress Neck: no carotid bruit and no JVD Lungs: clear to auscultation bilaterally Heart: tachy rate, regular rhythm  Extremities: no LEE, left groin with modeate sized hematoma, ecchymosis and bruit Pulses: 2+ and symmetric Skin: warm and dry Neurologic: Grossly normal  Lab Results:   Recent Labs  10/16/14 0345 10/16/14 0811 10/17/14 0751  WBC 9.0 7.4 7.6  HGB 8.4* 8.4* 7.7*  HCT 26.0* 26.0* 24.3*  PLT 240 242 238   BMET  Recent Labs  10/16/14 0345 10/17/14 0751  NA 132* 132*  K 4.6 4.5  CL 104 101  CO2 18* 22  GLUCOSE 170* 184*  BUN 15 16  CREATININE 0.96 1.13*  CALCIUM 8.6* 9.4    Studies/Results: VASCULAR LAB PRELIMINARY PRELIMINARY PRELIMINARY PRELIMINARY  Left groin ultrasound completed.   Preliminary report: There is a large, partially thrombosed pseudoaneurysm noted in the left groin. Unable to adequately interrogate size of neck secondary to hematoma and patient's pain.    Assessment/Plan    Active Problems:   Peripheral arterial disease   Claudication   Pseudoaneurysm of left femoral artery  1. PVD: s/p bilat CIA PTA/ Covered stenting  with ICast covered stents.  2. Pseudoaneurysm of left femoral artery: post cath complication. Confirmed by ultrasound earlier today. Partially thrombosed. [place] tech unable to interrogate size of neck secondary to hematoma and patient's pain. US guided compression cannot be performed on the weekend. RN to hold manual compression. PRN pain meds order to help facilitate manual compression. Will reassess with repeat US. If no resolution, will need to consider thrombin injection. Will f/u H/H. Continue to monitor groin closely. Patient instructed to notify RN if any development of severe low back/flank pain.   3. Acute Anemia: secondary to problem #2. Hgb dropped from 11-->7.7. F/U H/H and Type and Screen ordered for 1300. Will transfuse for hgb <7.0. She is receiving IVFs.     Brittainy M. Rosita Fire, PA-C 10/17/2014 10:50 AM  Her repeat hemoglobin is 7.5.  Currently she is resting comfortably.  Left groin pain is minimal after she has received a recent Vicodin.  Groin is covered with fresh dressing.  We will continue to follow closely.  Telemetry shows sinus tachycardia at 110/min.  Denies chest pain or shortness of breath.  Continue bedrest.

## 2014-10-17 NOTE — Progress Notes (Signed)
VASCULAR LAB PRELIMINARY  PRELIMINARY  PRELIMINARY  PRELIMINARY  Left groin ultrasound completed.    Preliminary report:  There is a large, partially thrombosed pseudoaneurysm noted in the left groin.  Unable to adequately interrogate size of neck secondary to hematoma and patient's pain.   Hercules Hasler, RVT 10/17/2014, 9:21 AM

## 2014-10-17 NOTE — Progress Notes (Signed)
Received patient from Unit 6c.  Alert /oriented x 3    Vital signs stable,  Complains of pain in her left groin/leg.   Will pt vicodin for pain.  Iv site with NSL,  Dressing  Left groin, clean dry & intact.  Mervyn Skeeters, RN

## 2014-10-17 NOTE — Progress Notes (Signed)
VASCULAR LAB PRELIMINARY  PRELIMINARY  PRELIMINARY  PRELIMINARY  Left pseudoaneurysm follow-up ultrasound completed.    Preliminary report:  Pseudoaneurysm appears closed.  Naksh Radi, RVT 10/17/2014, 2:58 PM

## 2014-10-17 NOTE — Progress Notes (Signed)
Palpable hematoma  Lt groin  manual pressure hold X 20 mins. Site reevaluated by Anderson Malta PA. Lt groin still with small palpable hematoma,pt c/o of lt groin  pain/ tenderness  on slight palpation of site. Lt groin  Pressure dsg maintained, kept pt on bedrest. For rpt vasc u/s of left groin, and cbc  today as ordered.

## 2014-10-18 ENCOUNTER — Other Ambulatory Visit: Payer: Self-pay | Admitting: Cardiology

## 2014-10-18 DIAGNOSIS — I724 Aneurysm of artery of lower extremity: Secondary | ICD-10-CM | POA: Diagnosis not present

## 2014-10-18 DIAGNOSIS — I739 Peripheral vascular disease, unspecified: Secondary | ICD-10-CM

## 2014-10-18 DIAGNOSIS — I70213 Atherosclerosis of native arteries of extremities with intermittent claudication, bilateral legs: Secondary | ICD-10-CM | POA: Diagnosis not present

## 2014-10-18 LAB — GLUCOSE, CAPILLARY
GLUCOSE-CAPILLARY: 203 mg/dL — AB (ref 65–99)
Glucose-Capillary: 188 mg/dL — ABNORMAL HIGH (ref 65–99)

## 2014-10-18 LAB — CBC
HCT: 23.2 % — ABNORMAL LOW (ref 36.0–46.0)
Hemoglobin: 7.5 g/dL — ABNORMAL LOW (ref 12.0–15.0)
MCH: 26.4 pg (ref 26.0–34.0)
MCHC: 32.3 g/dL (ref 30.0–36.0)
MCV: 81.7 fL (ref 78.0–100.0)
PLATELETS: 235 10*3/uL (ref 150–400)
RBC: 2.84 MIL/uL — AB (ref 3.87–5.11)
RDW: 16.3 % — AB (ref 11.5–15.5)
WBC: 8 10*3/uL (ref 4.0–10.5)

## 2014-10-18 MED ORDER — FERROUS SULFATE 325 (65 FE) MG PO TABS
325.0000 mg | ORAL_TABLET | Freq: Every day | ORAL | Status: DC
  Administered 2014-10-18: 325 mg via ORAL
  Filled 2014-10-18: qty 1

## 2014-10-18 MED ORDER — CLOPIDOGREL BISULFATE 75 MG PO TABS: 75.0000 mg | ORAL_TABLET | Freq: Every day | ORAL | Status: DC

## 2014-10-18 MED ORDER — HYDROCODONE-ACETAMINOPHEN 5-325 MG PO TABS: 1.0000 | ORAL_TABLET | ORAL | Status: DC | PRN

## 2014-10-18 MED ORDER — DOCUSATE SODIUM 100 MG PO CAPS: 100.0000 mg | ORAL_CAPSULE | Freq: Two times a day (BID) | ORAL | Status: DC

## 2014-10-18 MED ORDER — DOCUSATE SODIUM 100 MG PO CAPS
100.0000 mg | ORAL_CAPSULE | Freq: Two times a day (BID) | ORAL | Status: DC
  Administered 2014-10-18: 100 mg via ORAL
  Filled 2014-10-18: qty 1

## 2014-10-18 MED ORDER — FERROUS SULFATE 325 (65 FE) MG PO TABS: 325.0000 mg | ORAL_TABLET | Freq: Every day | ORAL | Status: DC

## 2014-10-18 NOTE — Progress Notes (Signed)
IV site discontinued, Discharge instructions reviewed with patient  Telemetry Discontinued Discharged to home with family @ 1207pm   Mervyn Skeeters, RN

## 2014-10-18 NOTE — Discharge Summary (Signed)
Physician Discharge Summary  Patient ID: Rachel [name] MRN: [id number] DOB/AGE: 1950/05/01 64 y.o.  Admit date: 10/15/2014 Discharge date: 10/18/2014  Admission Diagnoses: PVD with Claudication   Discharge Diagnoses:  Active Problems:   Peripheral arterial disease   Claudication   Pseudoaneurysm of left femoral artery   Discharged Condition: stable  Hospital Course: The patient is a 64 y/o AAF with a h/o tobacco abuse smoking one pack per day for last 45 years, treated hypertension, diabetes and hyperlipidemia. She does have ischemic heart disease status post RCA intervention February 2010 with overlapping drug eluting stents in her RCA. She had repeat cath the following year revealing these to be widely patent.   She was referred by Dr. [name] to Dr. Gwenlyn Found for evaluation due to complaints of bilateral lower extremity claudication, left greater than right over the last 2 years. Lower extremity arterial doppler studies performed 06/26/14 revealed a right ABI of 0.89 and a left of .72 with inflow disease suggesting aortoiliac disease. She underwent a diagnostic PV angiogram on 09/28/14 which demonstrated  severe aortic iliac disease. She had an occluded left common iliac artery at its origin reconstituting in the external iliac artery that was fluoroscopically calcified. She also had moderate right common iliac artery stenosis but with significant collaterals arising from the distal aorta.  On 10/16/14, she was admitted to [facility] to undergo planned intetervention. She underwent bilat CIA PTA/ Covered stenting with ICast covered stents.  She tolerated the procedure well, however post-operatively she developed severe left groin pain with a hematoma, significant ecchymosis and an audible bruit. Labs also showed acute anemia with drop in Hgb from 11-->7.7-->7.5. She was given IVFs. STAT femoral artery ultrasound confirmed a left femoral pseudoaneurysm, that appeared partially thrombosed. Additional  manual compression was held to help achieve hemostasis. A f/u ultrasound was performed and confirmed closure of the pseudoaneurysm. Her anemia was followed closely for an additional 24 hrs and she had no further drops in H/H. Her hemoglobin remained stable at 7.5 and she did not require transfusion. Supplemental Fe was ordered.  Prior to discharge, her groin was reassessed and was stable w/o bruit. She was able to ambulate without difficulty. She was last seen and examined by Dr. Mare Ferrari who determined she was stable for discharge home. She was give PRN Vicodin for pain. Dr. Mare Ferrari also recommended addition of colace prophylactically to keep her from straining to have a bowel movement. She was advised not to do any lifting or straining.  Close outpatient follow-up is schedule with Tarri Fuller, PA-C, on 10/21/14 to reasess groin and repeat CBC. Orders were placed for her to undergo repeat arterial dopplers at [facility] in 2 weeks. She will f/u with Dr. Gwenlyn Found.   Consults: None  Significant Diagnostic Studies:   Diagnostic PV Angiogram 09/28/14 Angiographic Data:   1: Abdominal aortogram-renal arteries were widely patent. The aorta was fluoroscopically calcified and moderately atherosclerotic 2: Left lower extremity-occluded at its origin with reconstitution in the external iliac artery by lumbar collaterals and IMA. There was mild segmental disease in mid left SFA with two-vessel runoff. The posterior tibial was occluded 3: Right lower extremity-70% calcified ostial right common iliac artery stenosis with a 25 mm pullback gradient using a 5 French endhole catheter after administration of 200 g of intra-arterial nitroglycerin via the SideArm sheath. There was 50% segmental mid right SFA with two-vessel runoff. The posterior tibial was occluded   Initial Groin Ultrasound 9:20 AM 10/17/14  Left groin ultrasound completed.   Preliminary  report: There is a large, partially thrombosed pseudoaneurysm  noted in the left groin. Unable to adequately interrogate size of neck secondary to hematoma and patient's pain.    Follow-up Groin Ultrasound 2:58 PM 10/17/14  Left pseudoaneurysm follow-up ultrasound completed.   Preliminary report: Pseudoaneurysm appears closed.   Treatments:  Procedures Performed: 1. Abdominal aortogram 2. PTCA and stent bilateral iliac arteries    Discharge Exam: Blood pressure 132/65, pulse 98, temperature 98.5 F (36.9 C), temperature source Oral, resp. rate 18, height 5\' 3"  (1.6 m), weight 156 lb 12 oz (71.1 kg), SpO2 100 %.   Disposition: 01-Home or Self Care      Discharge Instructions    Diet - low sodium heart healthy    Complete by:  As directed      Driving Restrictions    Complete by:  As directed   No driving for a week     Increase activity slowly    Complete by:  As directed      Lifting restrictions    Complete by:  As directed   No heavy lifting for 2 weeks            Medication List    STOP taking these medications        etodolac 400 MG tablet  Commonly known as:  LODINE      TAKE these medications        acetaminophen 500 MG tablet  Commonly known as:  TYLENOL  Take 1,000 mg by mouth every 6 (six) hours as needed. For pain     aspirin EC 81 MG tablet  Take 81 mg by mouth daily.     buPROPion 150 MG 12 hr tablet  Commonly known as:  WELLBUTRIN SR  Take 1 tablet (150 mg total) by mouth daily.     canagliflozin 100 MG Tabs tablet  Commonly known as:  INVOKANA  Take 2 tablets once a day.     clopidogrel 75 MG tablet  Commonly known as:  PLAVIX  Take 1 tablet (75 mg total) by mouth daily.     CRESTOR 20 MG tablet  Generic drug:  rosuvastatin  Take 1 tablet by mouth  every night at bedtime     docusate sodium 100 MG capsule  Commonly known as:  COLACE  Take 1 capsule (100 mg total) by mouth 2 (two) times daily.     ferrous sulfate 325 (65 FE) MG tablet  Take 1 tablet  (325 mg total) by mouth daily with breakfast.     hydrochlorothiazide 12.5 MG capsule  Commonly known as:  MICROZIDE  Take 1 capsule by mouth  daily     HYDROcodone-acetaminophen 5-325 MG per tablet  Commonly known as:  NORCO/VICODIN  Take 1-2 tablets by mouth every 4 (four) hours as needed for severe pain.     Insulin Glargine 100 UNIT/ML Solostar Pen  Commonly known as:  LANTUS SOLOSTAR  Inject 62 Units  into the skin at  bedtime.     levothyroxine 75 MCG tablet  Commonly known as:  SYNTHROID, LEVOTHROID  Take 1 tablet by mouth  daily before breakfast     meclizine 25 MG tablet  Commonly known as:  ANTIVERT  TAKE 1 TABLET (25 MG TOTAL) BY MOUTH EVERY 6 (SIX) HOURS AS NEEDED FOR DIZZINESS OR NAUSEA.     metFORMIN 500 MG tablet  Commonly known as:  GLUCOPHAGE  Take 1 tablet po q am and 1/2 tablet po q pm  nitroGLYCERIN 0.4 MG SL tablet  Commonly known as:  NITROSTAT  Place 0.4 mg under the tongue every 5 (five) minutes as needed. For chest pains     oxybutynin 10 MG 24 hr tablet  Commonly known as:  DITROPAN-XL  Take 2 tablets by mouth  every day     potassium chloride SA 20 MEQ tablet  Commonly known as:  K-DUR,KLOR-CON  Take 1 tablet (20 mEq total) by mouth 2 (two) times daily.       Follow-up Information    Follow up with HAGER, BRYAN, PA-C On 10/21/2014.   Specialties:  Physician Assistant, Radiology, Interventional Cardiology   Why:  10:30 am with Dr. Kennon Holter PA   Contact information:   [address] [place] [address] [phone]       Follow up with Quay Burow, MD.   Specialties:  Cardiology, Radiology   Why:  our office will call you to schedule follow-up ultrasound of legs and follow-up with Dr. Gwenlyn Found in 2 weeks   Contact information:   [address] [place] [address] [place], [place]: >30 MINUTES  Signed: Lyda Jester 10/18/2014, 11:53 AM

## 2014-10-18 NOTE — Progress Notes (Addendum)
Patient Profile: 64 y/o female admitted for LE angiogram for symptomatic PVD. S/P Bilat CIA PTA/ Covered stenting with ICast covered stents. Post cath recovery complicated by left groin hematoma/ pseudoaneurysm and subsequent acute blood loss anemia.   Subjective: Still with left groin tenderness with palpation, but better otherwise. Pain improved significantly. Denies low back/ flank pain.   Objective: Vital signs in last 24 hours: Temp:  [98.4 F (36.9 C)-98.6 F (37 C)] 98.5 F (36.9 C) (09/11 0330) Pulse Rate:  [98-100] 98 (09/11 0330) Resp:  [16-19] 18 (09/11 0330) BP: (128-141)/(65-77) 132/65 mmHg (09/11 0330) SpO2:  [99 %-100 %] 100 % (09/11 0330) Last BM Date: 10/14/14  Intake/Output from previous day: 09/10 0701 - 09/11 0700 In: 240 [P.O.:240] Out: -  Intake/Output this shift:    Medications Current Facility-Administered Medications  Medication Dose Route Frequency Provider Last Rate Last Dose  . acetaminophen (TYLENOL) tablet 650 mg  650 mg Oral Q4H PRN [name], MD   650 mg at 10/16/14 2236  . aspirin EC tablet 325 mg  325 mg Oral Daily [name], MD   325 mg at 10/17/14 0936  . buPROPion (WELLBUTRIN SR) 12 hr tablet 150 mg  150 mg Oral Daily [name], MD   150 mg at 10/17/14 1033  . clopidogrel (PLAVIX) tablet 75 mg  75 mg Oral Daily [name], MD   75 mg at 10/17/14 0936  . etodolac (LODINE) tablet 400 mg  400 mg Oral BID PRN [name], MD      . hydrALAZINE (APRESOLINE) injection 10 mg  10 mg Intravenous Q4H PRN [name], MD      . hydrochlorothiazide (MICROZIDE) capsule 12.5 mg  12.5 mg Oral Daily [name], MD   12.5 mg at 10/17/14 0936  . HYDROcodone-acetaminophen (NORCO/VICODIN) 5-325 MG per tablet 1-2 tablet  1-2 tablet Oral Q4H PRN Consuelo Pandy, PA-C   1 tablet at 10/18/14 0015  . insulin aspart (novoLOG) injection 0-15 Units  0-15 Units Subcutaneous TID WC Evelene Croon Barrett, PA-C   5 Units at  10/18/14 0649  . insulin aspart (novoLOG) injection 0-5 Units  0-5 Units Subcutaneous QHS Lonn Georgia, PA-C   2 Units at 10/17/14 2134  . levothyroxine (SYNTHROID, LEVOTHROID) tablet 75 mcg  75 mcg Oral QAC breakfast [name], MD   75 mcg at 10/18/14 [phone]  . morphine 2 MG/ML injection 2 mg  2 mg Intravenous Q1H PRN [name], MD   2 mg at 10/17/14 0934  . ondansetron (ZOFRAN) injection 4 mg  4 mg Intravenous Q6H PRN [name], MD      . potassium chloride SA (K-DUR,KLOR-CON) CR tablet 20 mEq  20 mEq Oral BID [name], MD   20 mEq at 10/17/14 2134  . promethazine (PHENERGAN) injection 12.5 mg  12.5 mg Intravenous Once PRN Dayna N Dunn, PA-C   12.5 mg at 10/15/14 1707    PE: General appearance: alert, cooperative and no distress Neck: no carotid bruit and no JVD Lungs: clear to auscultation bilaterally Heart: tachy rate, regular rhythm  Extremities: no LEE, left groin with modeate sized hematoma and ecchymosis but no bruit Pulses: 2+ and symmetric Skin: warm and dry Neurologic: Grossly normal  Lab Results:   Recent Labs  10/16/14 0811 10/17/14 0751 10/17/14 1240 10/18/14 0410  WBC 7.4 7.6  --  8.0  HGB 8.4* 7.7* 7.5* 7.5*  HCT 26.0* 24.3* 23.4*  23.2*  PLT 242 238  --  235   BMET  Recent Labs  10/16/14 0345 10/17/14 0751  NA 132* 132*  K 4.6 4.5  CL 104 101  CO2 18* 22  GLUCOSE 170* 184*  BUN 15 16  CREATININE 0.96 1.13*  CALCIUM 8.6* 9.4    Studies/Results: Initial Groin Ultrasound 9:20 AM VASCULAR LAB  PRELIMINARY PRELIMINARY PRELIMINARY PRELIMINARY  Left groin ultrasound completed.   Preliminary report: There is a large, partially thrombosed pseudoaneurysm noted in the left groin. Unable to adequately interrogate size of neck secondary to hematoma and patient's pain.   Follow-up Groin Ultrasound 2:58 PM VASCULAR LAB PRELIMINARY PRELIMINARY PRELIMINARY PRELIMINARY  Left pseudoaneurysm follow-up ultrasound  completed.   Preliminary report: Pseudoaneurysm appears closed.  Assessment/Plan    Active Problems:   Peripheral arterial disease   Claudication   Pseudoaneurysm of left femoral artery  1. PVD: s/p bilat CIA PTA/ Covered stenting with ICast covered stents. 2+ distal pulses bilaterally.   2. Pseudoaneurysm of left femoral artery: post cath complication. Initially confirmed by ultrasound 10/17/14. This resolved with manual compression with f/u ultrasound confirming closure of the pseudoaneurysm. No bruit on exam. No worsening anemia over the past 24 hrs. F/u Hgb remains at 7.5. Still with mild soreness. Continue PRN pain meds. Ambulate today.  3. Acute Anemia: secondary to problem #2. Hgb dropped from 11-->7.7-->7.5. F/U H/H has remained stable over the last 24 hrs. Hgb today is 7.5.   Dispo: Ambulate today to see how she does. If stable, possible discharge later today or in the am. Rx PRN Vicodin on discharge with no refills.  Will need office f/u next week to re-assess groin and f/u CBC to monitor H/H. Will need f/u bilateral lower extremity doppler studies at [facility] and f/u with Dr. Gwenlyn Found in 2 weeks.   Brittainy M. Ladoris Gene 10/18/2014 7:42 AM Agree with above plan.  Left groin was checked.  She has extensive ecchymosis and is still tender.  I do not hear any bruit today.  Her hemoglobin has remained stable overnight.  We will allow her to ambulate this morning.  If she tolerates ambulation, she can be discharged later today.  I will start her on iron for her blood loss anemia.  Also start her on Colace prophylactically to keep her from straining to have a bowel movement.  She was advised not to do any lifting or straining.

## 2014-10-18 NOTE — Progress Notes (Signed)
Ambulated in the hallway, walked down 2 hallway and back to room, complained with pain in her left groin, no bleeding, ecchymosis noted, soft around edges of hematoma area,   Vicodin  Tablet given  Mervyn Skeeters, RN

## 2014-10-21 ENCOUNTER — Encounter: Payer: Self-pay | Admitting: *Deleted

## 2014-10-21 ENCOUNTER — Ambulatory Visit (INDEPENDENT_AMBULATORY_CARE_PROVIDER_SITE_OTHER): Payer: 59 | Admitting: Physician Assistant

## 2014-10-21 ENCOUNTER — Encounter: Payer: Self-pay | Admitting: Physician Assistant

## 2014-10-21 VITALS — BP 132/70 | HR 80 | Ht 63.0 in | Wt 146.2 lb

## 2014-10-21 DIAGNOSIS — F17200 Nicotine dependence, unspecified, uncomplicated: Secondary | ICD-10-CM

## 2014-10-21 DIAGNOSIS — Z720 Tobacco use: Secondary | ICD-10-CM

## 2014-10-21 DIAGNOSIS — I729 Aneurysm of unspecified site: Secondary | ICD-10-CM

## 2014-10-21 DIAGNOSIS — I739 Peripheral vascular disease, unspecified: Secondary | ICD-10-CM

## 2014-10-21 DIAGNOSIS — E785 Hyperlipidemia, unspecified: Secondary | ICD-10-CM | POA: Diagnosis not present

## 2014-10-21 DIAGNOSIS — I2510 Atherosclerotic heart disease of native coronary artery without angina pectoris: Secondary | ICD-10-CM | POA: Diagnosis not present

## 2014-10-21 DIAGNOSIS — I724 Aneurysm of artery of lower extremity: Secondary | ICD-10-CM

## 2014-10-21 DIAGNOSIS — I10 Essential (primary) hypertension: Secondary | ICD-10-CM

## 2014-10-21 LAB — CBC
HCT: 27.7 % — ABNORMAL LOW (ref 36.0–46.0)
HEMOGLOBIN: 8.8 g/dL — AB (ref 12.0–15.0)
MCH: 26.9 pg (ref 26.0–34.0)
MCHC: 31.8 g/dL (ref 30.0–36.0)
MCV: 84.7 fL (ref 78.0–100.0)
MPV: 8.9 fL (ref 8.6–12.4)
Platelets: 468 10*3/uL — ABNORMAL HIGH (ref 150–400)
RBC: 3.27 MIL/uL — AB (ref 3.87–5.11)
RDW: 18.2 % — ABNORMAL HIGH (ref 11.5–15.5)
WBC: 12.4 10*3/uL — AB (ref 4.0–10.5)

## 2014-10-21 NOTE — Progress Notes (Signed)
Patient ID: Rachel [name], female   DOB: 1950/03/08, 64 y.o.   MRN: [id number]    Date:  10/21/2014   ID:  Rachel [name], DOB Jun 21, 1950, MRN [id number]  PCP:  Rachel Hillier, MD  Primary Cardiologist:  Rachel [name]  Chief Complaint:  Post hospital follow-up   History of Present Illness: Rachel [name] is a 65 y.o. female  The patient is a 64 y/o AAF with a h/o tobacco abuse smoking one pack per day for last 75 years, treated hypertension, diabetes and hyperlipidemia. She does have ischemic heart disease status post RCA intervention February 2010 with overlapping drug eluting stents in her RCA. She had repeat cath the following year revealing these to be widely patent.   She was referred by Dr. Domenic [name] to Dr. Gwenlyn [name] for evaluation due to complaints of bilateral lower extremity claudication, left greater than right over the last 2 years. Lower extremity arterial doppler studies performed 06/26/14 revealed a right ABI of 0.89 and a left of .72 with inflow disease suggesting aortoiliac disease. She underwent a diagnostic PV angiogram on 09/28/14 which demonstrated severe aortic iliac disease. She had an occluded left common iliac artery at its origin reconstituting in the external iliac artery that was fluoroscopically calcified. She also had moderate right common iliac artery stenosis but with significant collaterals arising from the distal aorta.  On 10/16/14, she was admitted to [facility] to undergo planned intetervention. She underwent bilat CIA PTA/ Covered stenting with ICast covered stents. She tolerated the procedure well, however post-operatively she developed severe left groin pain with a hematoma, significant ecchymosis and an audible bruit. Labs also showed acute anemia with drop in Hgb from 11-->7.7-->7.5. She was given IVFs. STAT femoral artery ultrasound confirmed a left femoral pseudoaneurysm, that appeared partially thrombosed. Additional manual compression was held to help achieve hemostasis.  A f/u ultrasound was performed and confirmed closure of the pseudoaneurysm. Her anemia was followed closely for an additional 24 hrs and she had no further drops in H/H. Her hemoglobin remained stable at 7.5 and she did not require transfusion. Supplemental Fe was ordered.  Prior to discharge, her groin was reassessed and was stable w/o bruit. She was able to ambulate without difficulty.   Patient presents today for early posthospital follow-up visit for pseudoaneurysm. She reports that he is still very painful in her left groin. She is using Vicodin twice a day. She says she can't walk across the parking lot before the pain starts to get worse.  She also reports a decreased appetite and some nausea.  She has periodic chronic issues with dizziness and nausea for which she takes meclizine.  She continues to smoke but has cut back.  The patient currently denies fever, chest pain, shortness of breath, orthopnea, dizziness, PND, cough, congestion, abdominal pain, hematochezia, melena, lower extremity edema, claudication.  Wt Readings from Last 3 Encounters:  10/21/14 146 lb 3.2 oz (66.316 kg)  10/17/14 156 lb 12 oz (71.1 kg)  10/13/14 152 lb (68.947 kg)     Past Medical History  Diagnosis Date  . Diabetes type 2, controlled 2000  . Hyperlipidemia   . Essential hypertension, benign   . GERD (gastroesophageal reflux disease)   . Fatty liver   . Tubular adenoma of colon 07/2007    Multiple colonic polyps  . Coronary atherosclerosis of native coronary artery     DES x 2 (overlapping) RCA 03/2008 - patent 2011  . Odynophagia     Severe esophagitis on CT 05/2010, treated  empirically with Diflucan  . Microalbuminuria   . Venous stasis   . Obesity   . PAD (peripheral artery disease)     history of left greater than right lower 70 claudication    Current Outpatient Prescriptions  Medication Sig Dispense Refill  . acetaminophen (TYLENOL) 500 MG tablet Take 1,000 mg by mouth every 6 (six) hours as  needed. For pain     . aspirin EC 81 MG tablet Take 81 mg by mouth daily.      Marland Kitchen buPROPion (WELLBUTRIN SR) 150 MG 12 hr tablet Take 1 tablet (150 mg total) by mouth daily.    . canagliflozin (INVOKANA) 100 MG TABS tablet Take 2 tablets once a day. 60 tablet 2  . clopidogrel (PLAVIX) 75 MG tablet Take 1 tablet (75 mg total) by mouth daily. 30 tablet 11  . CRESTOR 20 MG tablet Take 1 tablet by mouth  every night at bedtime 90 tablet 1  . docusate sodium (COLACE) 100 MG capsule Take 1 capsule (100 mg total) by mouth 2 (two) times daily. 10 capsule 0  . etodolac (LODINE) 400 MG tablet Take 400 mg by mouth daily as needed (arthritis pain).     . ferrous sulfate 325 (65 FE) MG tablet Take 1 tablet (325 mg total) by mouth daily with breakfast. 30 tablet 3  . hydrochlorothiazide (MICROZIDE) 12.5 MG capsule Take 1 capsule by mouth  daily 90 capsule 1  . HYDROcodone-acetaminophen (NORCO/VICODIN) 5-325 MG per tablet Take 1-2 tablets by mouth every 4 (four) hours as needed for severe pain. 30 tablet 0  . Insulin Glargine (LANTUS SOLOSTAR) 100 UNIT/ML Solostar Pen Inject 62 Units  into the skin at  bedtime. 15 mL 1  . levothyroxine (SYNTHROID, LEVOTHROID) 75 MCG tablet Take 1 tablet by mouth  daily before breakfast 90 tablet 1  . meclizine (ANTIVERT) 25 MG tablet TAKE 1 TABLET (25 MG TOTAL) BY MOUTH EVERY 6 (SIX) HOURS AS NEEDED FOR DIZZINESS OR NAUSEA. 30 tablet 3  . metFORMIN (GLUCOPHAGE) 500 MG tablet Take 1 tablet po q am and 1/2 tablet po q pm 135 tablet 1  . nitroGLYCERIN (NITROSTAT) 0.4 MG SL tablet Place 0.4 mg under the tongue every 5 (five) minutes as needed. For chest pains    . oxybutynin (DITROPAN-XL) 10 MG 24 hr tablet Take 2 tablets by mouth  every day 180 tablet 0  . potassium chloride SA (K-DUR,KLOR-CON) 20 MEQ tablet Take 1 tablet (20 mEq total) by mouth 2 (two) times daily. 60 tablet 4   No current facility-administered medications for this visit.    Allergies:    Allergies  Allergen  Reactions  . Altace [Ramipril] Cough  . Biaxin [Clarithromycin]     Fatigue  . Niacin And Related Other (See Comments)    Flush - felt like on fire.  . Nsaids Other (See Comments)    feverish    Social History:  The patient  reports that she has been smoking Cigarettes.  She has a 47 pack-year smoking history. She has never used smokeless tobacco. She reports that she does not drink alcohol or use illicit drugs.   Family history:   Family History  Problem Relation Age of Onset  . Bone cancer Father   . Heart disease Maternal Grandmother   . Colon cancer Neg Hx   . Liver disease Neg Hx   . Inflammatory bowel disease Neg Hx   . Hypertension Mother     ROS:  Please see the history  of present illness.  All other systems reviewed and negative.   PHYSICAL EXAM: VS:  BP 132/70 mmHg  Pulse 80  Ht 5\' 3"  (1.6 m)  Wt 146 lb 3.2 oz (66.316 kg)  BMI 25.90 kg/m2 Well nourished, well developed, in no acute distress HEENT: Pupils are equal round react to light accommodation extraocular movements are intact.  Neck: no JVDNo cervical lymphadenopathy. Cardiac: Regular rate and rhythm without murmurs rubs or gallops. Lungs:  clear to auscultation bilaterally, no wheezing, rhonchi or rales  Ext: no lower extremity edema.  2+ radial and dorsalis pedis pulses. Skin: warm and dry. Left line is a moderate to large size hematoma which appears to be very tender.  She winced when I barely touched it. There is no bruit and she has good distal pulses. Neuro:  Grossly normal     ASSESSMENT AND PLAN:  Problem List Items Addressed This Visit    TOBACCO ABUSE   Pseudoaneurysm of left femoral artery   Peripheral arterial disease   Hyperlipidemia LDL goal <70   Essential hypertension, benign   Coronary atherosclerosis of native coronary artery    Other Visit Diagnoses    Pseudoaneurysm    -  Primary    Relevant Orders    CBC      Pseudoaneurysm: Patient still has a firm moderate-sized  hematoma in her left groin. Is a large area of ecchymosis. Was somewhat difficult to assess the size because she winced in pain when I barely touched the area.  There is no audible bruit and she has good distal pulses. We will check a CBC to make sure hemoglobin is stable.  Reassured her that this will get reabsorbed and pain both diminished.  I think she should be okay to return to work on Monday the 19th. She has an office job.  Peripheral arterial disease. She underwent bilat CIA PTA/ Covered stenting with ICast covered stents.  Lower extremity arterial Dopplers are scheduled for the 29th. She will follow-up with Dr. Gwenlyn [name] after that.  Continue aspirin and Plavix  Essential hypertension: Blood pressure control. No changes. Tobacco abuse: She is trying to quit but is occasionally having cigarettes. Hyperlipidemia: Continue Crestor Coronary artery disease: No complaints of angina.

## 2014-10-21 NOTE — Patient Instructions (Signed)
Your physician recommends that you schedule a follow-up appointment AFTER DOPPLER TESTING ON 11-05-14 WITH DR BERRY OR EXTENDER SAME DAY AS DR BERRY IN THE OFFICE

## 2014-10-27 ENCOUNTER — Ambulatory Visit: Payer: 59 | Admitting: Cardiovascular Disease

## 2014-10-29 ENCOUNTER — Other Ambulatory Visit: Payer: Self-pay | Admitting: Cardiology

## 2014-10-29 ENCOUNTER — Telehealth: Payer: Self-pay | Admitting: Physician Assistant

## 2014-10-29 DIAGNOSIS — I700 Atherosclerosis of aorta: Secondary | ICD-10-CM

## 2014-10-29 NOTE — Telephone Encounter (Signed)
Letter faxed.

## 2014-10-29 NOTE — Telephone Encounter (Signed)
Hand written correction on work note is not being accepted by employer.  Request work note to be hard written as return date of 10/29/14  Letter recomposed to have return date to be 10/29/14 and faxed by patient request to [phone] Attention Marisa Severin

## 2014-10-29 NOTE — Telephone Encounter (Signed)
LMTCB

## 2014-10-29 NOTE — Telephone Encounter (Signed)
LVMTCB

## 2014-10-29 NOTE — Telephone Encounter (Signed)
Pt is returning Mary's call   Thanks

## 2014-10-29 NOTE — Telephone Encounter (Signed)
Pt has a work note that need to be redone asap.

## 2014-11-04 ENCOUNTER — Other Ambulatory Visit: Payer: Self-pay | Admitting: Family Medicine

## 2014-11-04 ENCOUNTER — Telehealth: Payer: Self-pay | Admitting: Cardiovascular Disease

## 2014-11-04 NOTE — Telephone Encounter (Signed)
Ann called in stating that she sent over an Attendee Provider statement form on 9/14 and has yet to receive it back. She says if it would be easier for him, he could just send the office note. Please f/u with her  Thanks

## 2014-11-05 ENCOUNTER — Ambulatory Visit (HOSPITAL_COMMUNITY)
Admission: RE | Admit: 2014-11-05 | Discharge: 2014-11-05 | Disposition: A | Discharge status: Home / Self Care | Payer: 59 | Source: Ambulatory Visit | Attending: Cardiology | Admitting: Cardiology

## 2014-11-05 ENCOUNTER — Ambulatory Visit (HOSPITAL_BASED_OUTPATIENT_CLINIC_OR_DEPARTMENT_OTHER)
Admission: RE | Admit: 2014-11-05 | Discharge: 2014-11-05 | Disposition: A | Discharge status: Home / Self Care | Payer: 59 | Source: Ambulatory Visit | Attending: Cardiology | Admitting: Cardiology

## 2014-11-05 DIAGNOSIS — E119 Type 2 diabetes mellitus without complications: Secondary | ICD-10-CM | POA: Diagnosis not present

## 2014-11-05 DIAGNOSIS — F17200 Nicotine dependence, unspecified, uncomplicated: Secondary | ICD-10-CM | POA: Insufficient documentation

## 2014-11-05 DIAGNOSIS — I739 Peripheral vascular disease, unspecified: Secondary | ICD-10-CM | POA: Insufficient documentation

## 2014-11-05 DIAGNOSIS — E785 Hyperlipidemia, unspecified: Secondary | ICD-10-CM | POA: Insufficient documentation

## 2014-11-05 DIAGNOSIS — I2510 Atherosclerotic heart disease of native coronary artery without angina pectoris: Secondary | ICD-10-CM | POA: Insufficient documentation

## 2014-11-05 DIAGNOSIS — I700 Atherosclerosis of aorta: Secondary | ICD-10-CM | POA: Diagnosis present

## 2014-11-05 DIAGNOSIS — I10 Essential (primary) hypertension: Secondary | ICD-10-CM | POA: Insufficient documentation

## 2014-11-06 ENCOUNTER — Other Ambulatory Visit: Payer: Self-pay

## 2014-11-06 ENCOUNTER — Telehealth: Payer: Self-pay

## 2014-11-06 ENCOUNTER — Encounter: Payer: Self-pay | Admitting: Cardiovascular Disease

## 2014-11-06 ENCOUNTER — Ambulatory Visit (INDEPENDENT_AMBULATORY_CARE_PROVIDER_SITE_OTHER): Payer: 59 | Admitting: Cardiovascular Disease

## 2014-11-06 VITALS — BP 152/84 | HR 86 | Ht 63.0 in | Wt 155.0 lb

## 2014-11-06 DIAGNOSIS — Z720 Tobacco use: Secondary | ICD-10-CM

## 2014-11-06 DIAGNOSIS — I739 Peripheral vascular disease, unspecified: Secondary | ICD-10-CM | POA: Diagnosis not present

## 2014-11-06 MED ORDER — CLOPIDOGREL BISULFATE 75 MG PO TABS: 75.0000 mg | ORAL_TABLET | Freq: Every day | ORAL | Status: DC

## 2014-11-06 NOTE — Patient Instructions (Signed)
Medication Instructions:  Your physician recommends that you continue on your current medications as directed. Please refer to the Current Medication list given to you today.   Labwork: none  Testing/Procedures: Your physician has requested that you have a lower extremity arterial exercise duplex. During this test, exercise and ultrasound are used to evaluate arterial blood flow in the legs. Allow one hour for this exam. There are no restrictions or special instructions.  IN 6 MONTHS   Follow-Up: Your physician wants you to follow-up in: 6 MONTHS with Dr. Gwenlyn Found, about a week after LEA is completed. You will receive a reminder letter in the mail two months in advance. If you don't receive a letter, please call our office to schedule the follow-up appointment.   Any Other Special Instructions Will Be Listed Below (If Applicable).

## 2014-11-06 NOTE — Telephone Encounter (Signed)
Spoke with pt that during [place] on 9/30.  Pt needing [place] to send copy of work letter to her [employer] disability as to her need to return on 10/29/2014. Pt given copy of letter for records and told that nurse will check with medical records on fax to [employer].

## 2014-11-06 NOTE — Telephone Encounter (Signed)
Refill of Clopidogrel 75mg  sent to [facility]

## 2014-11-06 NOTE — Assessment & Plan Note (Signed)
Rachel [name] returns today for post hospital follow-up after her recent percutaneous revascularizing procedure that I performed 10/15/14. I was able to cross a left common iliac chronic total occlusion and stented both iliac arteries using "kissing stent technique with Icast covered stents.the procedure was complicated postop by left groin hematoma, pseudoaneurysm a complex ultrasound which ultimately thrombosed spontaneously. Her hemoglobin did drop to 7.5 level and she did not require transfusion. Her follow-up Dopplers performed on 11/05/14 revealed normal velocities in her iliac arteries with a right ABI of 0.97 and a left ABI of 1. The claudication which she was using prior to her revascularize procedure has resolved although she still has some left hip tenderness from resolving hematoma. She has 1+ left and 2+ right pedal pulse.

## 2014-11-06 NOTE — Progress Notes (Signed)
11/06/2014 Rachel [name]   [birth year]  [id number]  Primary Physician [name], MD Primary Cardiologist: [name] MD Renae Gloss   HPI:  Rachel [name] is a delightful 64 year old mildly overweight divorced African-American female mother of one, grandmother of one grandchild who is referred by Dr. [name] for lifestyle limiting claudication. I last saw her in the office 8/31 6/16.Her primary care physician is Dr. [name]. She has a history of tobacco abuse smoking one pack per day for last 45 years, treated hypertension, diabetes and hyperlipidemia. She does have ischemic heart disease status post RCA intervention February 2010 with overlapping drug eluting stents in her RCA. She had repeat cath the following year revealing these to be widely patent. She complains of right lower extremity claudication claudication left greater than right over the last 2 years. 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 I reviewed this with Dr. Trula Slade who suggested that we attempt percutaneous vascular station before considering aortobifemoral bypass grafting. I angiogram on 10/15/14 and was able to cross her left common iliac chronic total occlusion. I stented both iliac arteries with I cast cover stents using "kissing stent technique". Her procedure was stopped. By a significant hematoma in the left side with a left common femoral pseudoaneurysm that was partially thrombosed initially and follow-up Doppler was completely thrombosed. Her hemoglobin stabilized not requiring transfusion. Her claudication has resolved although she still has some pain in her left hip from resolving hematoma. Her Doppler studies  showed normal ABIs bilaterally.   Current Outpatient Prescriptions  Medication Sig Dispense Refill  . acetaminophen (TYLENOL) 500 MG tablet Take 1,000 mg by mouth every 6 (six) hours as needed. For pain     . aspirin EC 81 MG tablet Take 81 mg by mouth daily.      Marland Kitchen buPROPion (WELLBUTRIN SR) 150 MG 12 hr tablet Take 1 tablet (150 mg total) by mouth daily.    Marland Kitchen docusate sodium (COLACE) 100 MG capsule Take 1 capsule (100 mg total) by mouth 2 (two) times daily. 10 capsule 0  . ferrous sulfate 325 (65 FE) MG tablet Take 1 tablet (325 mg total) by mouth daily with breakfast. 30 tablet 3  . hydrochlorothiazide (MICROZIDE) 12.5 MG capsule Take 1 capsule by mouth  daily 90 capsule 1  . HYDROcodone-acetaminophen (NORCO/VICODIN) 5-325 MG per tablet Take 1-2 tablets by mouth every 4 (four) hours as needed for severe pain. 30 tablet 0  . INVOKANA 100 MG TABS tablet Take 1 tablet by mouth  daily 90 tablet 1  . LANTUS SOLOSTAR 100 UNIT/ML Solostar Pen Inject subcutaneously 62  units at bedtime 15 mL 1  . levothyroxine (SYNTHROID, LEVOTHROID) 75 MCG tablet Take 1 tablet by mouth  daily before breakfast 90 tablet 1  . meclizine (ANTIVERT) 25 MG tablet TAKE 1 TABLET (25 MG TOTAL) BY MOUTH EVERY 6 (SIX) HOURS AS NEEDED FOR DIZZINESS OR NAUSEA. 30 tablet 3  . metFORMIN (GLUCOPHAGE) 500 MG tablet Take 1 tablet po q am and 1/2 tablet po q pm 135 tablet 1  . nitroGLYCERIN (NITROSTAT) 0.4 MG SL tablet Place 0.4 mg under the tongue every 5 (five) minutes as needed. For chest pains    . oxybutynin (DITROPAN-XL) 10 MG  24 hr tablet Take 2 tablets by mouth  every day 180 tablet 1  . potassium chloride SA (K-DUR,KLOR-CON) 20 MEQ tablet Take 1 tablet (20 mEq total) by mouth 2 (two) times daily. 60 tablet 4  . rosuvastatin (CRESTOR) 20 MG tablet Take 1 tablet by mouth  every night at bedtime 90 tablet 1  . clopidogrel (PLAVIX) 75 MG tablet Take 1 tablet (75 mg total) by mouth daily. 90 tablet 3   No current  facility-administered medications for this visit.    Allergies  Allergen Reactions  . Altace [Ramipril] Cough  . Biaxin [Clarithromycin]     Fatigue  . Niacin And Related Other (See Comments)    Flush - felt like on fire.  . Nsaids Other (See Comments)    feverish    Social History   Social History  . Marital Status: Divorced    Spouse Name: N/A  . Number of Children: 1  . Years of Education: N/A   Occupational History  . Billing specialist [employer]   Social History Main Topics  . Smoking status: Current Every Day Smoker -- 1.00 packs/day for 47 years    Types: Cigarettes  . Smokeless tobacco: Never Used  . Alcohol Use: No  . Drug Use: No  . Sexual Activity:    Partners: Male    Birth Control/ Protection: Condom     Comment: mutual friends   Other Topics Concern  . Not on file   Social History Narrative     Review of Systems: General: negative for chills, fever, night sweats or weight changes.  Cardiovascular: negative for chest pain, dyspnea on exertion, edema, orthopnea, palpitations, paroxysmal nocturnal dyspnea or shortness of breath Dermatological: negative for rash Respiratory: negative for cough or wheezing Urologic: negative for hematuria Abdominal: negative for nausea, vomiting, diarrhea, bright red blood per rectum, melena, or hematemesis Neurologic: negative for visual changes, syncope, or dizziness All other systems reviewed and are otherwise negative except as noted above.    Blood pressure 152/84, pulse 86, height 5\' 3"  (1.6 m), weight 155 lb (70.308 kg).  General appearance: alert and no distress Neck: no adenopathy, no carotid bruit, no JVD, supple, symmetrical, trachea midline and thyroid not enlarged, symmetric, no tenderness/mass/nodules Lungs: clear to auscultation bilaterally Heart: regular rate and rhythm, S1, S2 normal, no murmur, click, rub or gallop Extremities: extremities normal, atraumatic, no cyanosis or edema and resolving  ecchymosis left hip, resolving hematoma left groin. There was no bruit noted. She had 2+ right and 1+ left pedal pulse  EKG not performed today  ASSESSMENT AND PLAN:   Peripheral arterial disease Mrs. Bencivenga returns today for post hospital follow-up after her recent percutaneous revascularizing procedure that I performed 10/15/14. I was able to cross a left common iliac chronic total occlusion and stented both iliac arteries using "kissing stent technique with Icast covered stents.the procedure was complicated postop by left groin hematoma, pseudoaneurysm a complex ultrasound which ultimately thrombosed spontaneously. Her hemoglobin did drop to 7.5 level and she did not require transfusion. Her follow-up Dopplers performed on 11/05/14 revealed normal velocities in her iliac arteries with a right ABI of 0.97 and a left ABI of 1. The claudication which she was using prior to her revascularize procedure has resolved although she still has some left hip tenderness from resolving hematoma. She has 1+ left and 2+ right pedal pulse.      [name] MD FACP,FACC,FAHA, FSCAI 11/06/2014 4:20 PM

## 2015-02-05 DIAGNOSIS — Z029 Encounter for administrative examinations, unspecified: Secondary | ICD-10-CM

## 2015-02-15 ENCOUNTER — Other Ambulatory Visit: Payer: Self-pay | Admitting: Family Medicine

## 2015-03-26 ENCOUNTER — Encounter: Payer: Self-pay | Admitting: Family Medicine

## 2015-03-26 ENCOUNTER — Ambulatory Visit (INDEPENDENT_AMBULATORY_CARE_PROVIDER_SITE_OTHER): Payer: BLUE CROSS/BLUE SHIELD | Admitting: Family Medicine

## 2015-03-26 VITALS — BP 132/88 | Temp 98.8°F | Ht 63.0 in | Wt 151.0 lb

## 2015-03-26 DIAGNOSIS — E785 Hyperlipidemia, unspecified: Secondary | ICD-10-CM

## 2015-03-26 DIAGNOSIS — J329 Chronic sinusitis, unspecified: Secondary | ICD-10-CM | POA: Diagnosis not present

## 2015-03-26 DIAGNOSIS — E119 Type 2 diabetes mellitus without complications: Secondary | ICD-10-CM | POA: Diagnosis not present

## 2015-03-26 DIAGNOSIS — Z79899 Other long term (current) drug therapy: Secondary | ICD-10-CM

## 2015-03-26 MED ORDER — AMOXICILLIN-POT CLAVULANATE 875-125 MG PO TABS: 1.0000 | ORAL_TABLET | Freq: Two times a day (BID) | ORAL | Status: DC

## 2015-03-26 MED ORDER — ALBUTEROL SULFATE HFA 108 (90 BASE) MCG/ACT IN AERS: INHALATION_SPRAY | RESPIRATORY_TRACT | Status: DC

## 2015-03-26 NOTE — Progress Notes (Signed)
   Subjective:    Patient ID: Rachel [name], female    DOB: [birth year], 65 y.o.   MRN: [id number]  Cough This is a new problem. Episode onset: 3 weeks ago. Associated symptoms include ear pain and nasal congestion. Treatments tried: robitussin, delsym.   Three wk ago viral chills fever nausea min energy achey  Bad cough not helpinf, cough productive at times. Next  Patient does smoke smoke.  Wheezy texture also hoarse voice  Non road   Review of Systems  HENT: Positive for ear pain.   Respiratory: Positive for cough.        Objective:   Physical Exam Alert mild malaise vital stable HEENT mom his condition lungs no wheezes deep bronchial cough heart regular in rhythm       Assessment & Plan:  Impression rhinosinusitis/bronchitis with probable element of reactive airway plan albuterol 2 sprays every 6 when necessary. Antibiotics prescribed. Symptom care discussed stop smoking WSL

## 2015-04-05 ENCOUNTER — Other Ambulatory Visit: Payer: Self-pay | Admitting: Family Medicine

## 2015-04-09 ENCOUNTER — Ambulatory Visit: Payer: BLUE CROSS/BLUE SHIELD | Admitting: Family Medicine

## 2015-04-12 ENCOUNTER — Telehealth: Payer: Self-pay | Admitting: Family Medicine

## 2015-04-12 MED ORDER — BLOOD GLUCOSE MONITORING SUPPL W/DEVICE KIT: PACK | Status: DC

## 2015-04-12 NOTE — Telephone Encounter (Signed)
Patient needs Rx for diabetic monitor and supplies.   [facility]

## 2015-04-12 NOTE — Telephone Encounter (Signed)
Spoke with patient and informed patient that per protocol we could send in new prescription for diabetic testing kit and device. Patient verbalized understanding

## 2015-04-19 LAB — BASIC METABOLIC PANEL
BUN/Creatinine Ratio: 11 (ref 11–26)
BUN: 9 mg/dL (ref 8–27)
CO2: 17 mmol/L — ABNORMAL LOW (ref 18–29)
Calcium: 9.6 mg/dL (ref 8.7–10.3)
Chloride: 102 mmol/L (ref 96–106)
Creatinine, Ser: 0.79 mg/dL (ref 0.57–1.00)
GFR, EST AFRICAN AMERICAN: 93 mL/min/{1.73_m2} (ref >59)
GFR, EST NON AFRICAN AMERICAN: 80 mL/min/{1.73_m2} (ref >59)
Glucose: 185 mg/dL — ABNORMAL HIGH (ref 65–99)
POTASSIUM: 4.6 mmol/L (ref 3.5–5.2)
SODIUM: 139 mmol/L (ref 134–144)

## 2015-04-19 LAB — LIPID PANEL
CHOLESTEROL TOTAL: 192 mg/dL (ref 100–199)
Chol/HDL Ratio: 5.8 ratio units — ABNORMAL HIGH (ref 0.0–4.4)
HDL: 33 mg/dL — ABNORMAL LOW (ref >39)
LDL Calculated: 110 mg/dL — ABNORMAL HIGH (ref 0–99)
TRIGLYCERIDES: 247 mg/dL — AB (ref 0–149)
VLDL CHOLESTEROL CAL: 49 mg/dL — AB (ref 5–40)

## 2015-04-19 LAB — HEPATIC FUNCTION PANEL
ALBUMIN: 4 g/dL (ref 3.6–4.8)
ALK PHOS: 132 IU/L — AB (ref 39–117)
ALT: 8 IU/L (ref 0–32)
AST: 11 IU/L (ref 0–40)
Bilirubin, Direct: 0.05 mg/dL (ref 0.00–0.40)
Total Protein: 7 g/dL (ref 6.0–8.5)

## 2015-04-19 LAB — HEMOGLOBIN A1C
ESTIMATED AVERAGE GLUCOSE: 306 mg/dL
Hgb A1c MFr Bld: 12.3 % — ABNORMAL HIGH (ref 4.8–5.6)

## 2015-04-23 ENCOUNTER — Ambulatory Visit (INDEPENDENT_AMBULATORY_CARE_PROVIDER_SITE_OTHER): Payer: BLUE CROSS/BLUE SHIELD | Admitting: Family Medicine

## 2015-04-23 ENCOUNTER — Encounter: Payer: Self-pay | Admitting: Family Medicine

## 2015-04-23 VITALS — BP 124/86 | Ht 63.0 in | Wt 152.0 lb

## 2015-04-23 DIAGNOSIS — E0842 Diabetes mellitus due to underlying condition with diabetic polyneuropathy: Secondary | ICD-10-CM

## 2015-04-23 DIAGNOSIS — E119 Type 2 diabetes mellitus without complications: Secondary | ICD-10-CM | POA: Diagnosis not present

## 2015-04-23 DIAGNOSIS — E785 Hyperlipidemia, unspecified: Secondary | ICD-10-CM | POA: Diagnosis not present

## 2015-04-23 MED ORDER — LEVOTHYROXINE SODIUM 75 MCG PO TABS: ORAL_TABLET | ORAL | Status: DC

## 2015-04-23 MED ORDER — ROSUVASTATIN CALCIUM 20 MG PO TABS: ORAL_TABLET | ORAL | Status: DC

## 2015-04-23 MED ORDER — CANAGLIFLOZIN 100 MG PO TABS: ORAL_TABLET | ORAL | Status: DC

## 2015-04-23 MED ORDER — METFORMIN HCL 500 MG PO TABS: ORAL_TABLET | ORAL | Status: DC

## 2015-04-23 MED ORDER — OXYBUTYNIN CHLORIDE ER 10 MG PO TB24: ORAL_TABLET | ORAL | Status: DC

## 2015-04-23 NOTE — Progress Notes (Signed)
   Subjective:    Patient ID: Rachel [name], female    DOB: October 19, 1950, 65 y.o.   MRN: [id number]  Diabetes She presents for her follow-up diabetic visit. She has type 2 diabetes mellitus. Compliance with diabetes treatment: lost insurance for awhile and had to stretch meds out for awhile but now taking meds every day. She never participates in exercise. Home blood sugar record trend: has some sugars over 600. pt bought a new meter and got better readings. highest on new meter is 181. She does not see a podiatrist.Eye exam is not current.   A1C 12.3 on bloodwork 04/17/15.   dosaging bck to very high    Patient claims compliance with lipid medication at this time. For while she was out of the medicine. No obvious side effects. Now back on.  Compliant blood pressure medicine. Blood pressures John good when checked recently. Was out the medicine for while unfortunately. Watching salt intake.  Amazingly did not take her insulin and tablets as directed. Stretch them out "". Noted very elevated sugars. Next  No pain legs. Ongoing numbness in feet. Some burning with this  Results for orders placed or performed in visit on 03/26/15  Lipid panel  Result Value Ref Range   Cholesterol, Total 192 100 - 199 mg/dL   Triglycerides 247 (H) 0 - 149 mg/dL   HDL 33 (L) >39 mg/dL   VLDL Cholesterol Cal 49 (H) 5 - 40 mg/dL   LDL Calculated 110 (H) 0 - 99 mg/dL   Chol/HDL Ratio 5.8 (H) 0.0 - 4.4 ratio units  Hepatic function panel  Result Value Ref Range   Total Protein 7.0 6.0 - 8.5 g/dL   Albumin 4.0 3.6 - 4.8 g/dL   Bilirubin Total <0.2 0.0 - 1.2 mg/dL   Bilirubin, Direct 0.05 0.00 - 0.40 mg/dL   Alkaline Phosphatase 132 (H) 39 - 117 IU/L   AST 11 0 - 40 IU/L   ALT 8 0 - 32 IU/L  Basic metabolic panel  Result Value Ref Range   Glucose 185 (H) 65 - 99 mg/dL   BUN 9 8 - 27 mg/dL   Creatinine, Ser 0.79 0.57 - 1.00 mg/dL   GFR calc non Af Amer 80 >59 mL/min/1.73   GFR calc Af Amer 93 >59  mL/min/1.73   BUN/Creatinine Ratio 11 11 - 26   Sodium 139 134 - 144 mmol/L   Potassium 4.6 3.5 - 5.2 mmol/L   Chloride 102 96 - 106 mmol/L   CO2 17 (L) 18 - 29 mmol/L   Calcium 9.6 8.7 - 10.3 mg/dL  Hemoglobin A1c  Result Value Ref Range   Hgb A1c MFr Bld 12.3 (H) 4.8 - 5.6 %   Est. average glucose Bld gHb Est-mCnc 306 mg/dL    Review of Systems No chest pain no shortness breath no abdominal pain no change in bowel habits ROS otherwise negative    Objective:   Physical Exam  Alert vital stable no acute distress H&T normal lungs clear heart rhythm ankles without edema feet sensation diminished arterial pulses diminished      Assessment & Plan:  Impression #1 type 2 diabetes control horrible compliance near horrible. Patient states secondary to finances but has had difficulty with compliance in past even when insured well #2 hypertension current good control #3 hyperlipidemia suboptimal discussed #4 peripheral arterial disease clinically stable #5 peripheral neuropathy ongoing potentially worsened by recent flare plan resume medications diet exercise discussed. Follow-up as scheduled WSL

## 2015-04-27 ENCOUNTER — Emergency Department (HOSPITAL_COMMUNITY): Payer: BLUE CROSS/BLUE SHIELD

## 2015-04-27 ENCOUNTER — Emergency Department (HOSPITAL_COMMUNITY)
Admission: EM | Admit: 2015-04-27 | Discharge: 2015-04-27 | Disposition: A | Discharge status: Home / Self Care | Payer: BLUE CROSS/BLUE SHIELD | Attending: Emergency Medicine | Admitting: Emergency Medicine

## 2015-04-27 ENCOUNTER — Encounter (HOSPITAL_COMMUNITY): Payer: Self-pay

## 2015-04-27 DIAGNOSIS — I2510 Atherosclerotic heart disease of native coronary artery without angina pectoris: Secondary | ICD-10-CM | POA: Insufficient documentation

## 2015-04-27 DIAGNOSIS — F17210 Nicotine dependence, cigarettes, uncomplicated: Secondary | ICD-10-CM | POA: Insufficient documentation

## 2015-04-27 DIAGNOSIS — Z79899 Other long term (current) drug therapy: Secondary | ICD-10-CM | POA: Insufficient documentation

## 2015-04-27 DIAGNOSIS — E1151 Type 2 diabetes mellitus with diabetic peripheral angiopathy without gangrene: Secondary | ICD-10-CM | POA: Diagnosis not present

## 2015-04-27 DIAGNOSIS — E785 Hyperlipidemia, unspecified: Secondary | ICD-10-CM | POA: Diagnosis not present

## 2015-04-27 DIAGNOSIS — I10 Essential (primary) hypertension: Secondary | ICD-10-CM | POA: Insufficient documentation

## 2015-04-27 DIAGNOSIS — Z7982 Long term (current) use of aspirin: Secondary | ICD-10-CM | POA: Insufficient documentation

## 2015-04-27 DIAGNOSIS — E669 Obesity, unspecified: Secondary | ICD-10-CM | POA: Insufficient documentation

## 2015-04-27 DIAGNOSIS — R05 Cough: Secondary | ICD-10-CM | POA: Insufficient documentation

## 2015-04-27 DIAGNOSIS — R0789 Other chest pain: Secondary | ICD-10-CM | POA: Diagnosis not present

## 2015-04-27 DIAGNOSIS — Z7984 Long term (current) use of oral hypoglycemic drugs: Secondary | ICD-10-CM | POA: Diagnosis not present

## 2015-04-27 MED ORDER — DEXAMETHASONE SODIUM PHOSPHATE 10 MG/ML IJ SOLN: 10.0000 mg | Freq: Once | INTRAMUSCULAR | Status: DC

## 2015-04-27 NOTE — ED Notes (Addendum)
Pt reports had bronchitis a few weeks ago and was taking antibiotics and using an inhaler.  Reports still has a cough and c/o chest tightness, burning in throat, and metallic taste in her mouth since yesterday.  Pt says chest hurts worse with cough and deep breathing.

## 2015-04-27 NOTE — ED Provider Notes (Signed)
History  By signing my name below, I, Marlowe Kays, attest that this documentation has been prepared under the direction and in the presence of Carmin Muskrat, MD. Electronically Signed: Marlowe Kays, ED Scribe. 04/27/2015. 11:55 AM.  Chief Complaint  Patient presents with  . Chest Pain   The history is provided by the patient and medical records. No language interpreter was used.    HPI Comments:  Rachel [name] is a 65 y.o. female who presents to the Emergency Department complaining of chest tightness that began about two days ago. She reports a metallic taste in her mouth as well. She states she had bronchitis about one month ago and has a cough that never resolved. She has not taken anything for her symptoms but states she just finished a course of antibiotics and has an MDI for the bronchitis. She denies modifying factors. She denies fever, chills, nausea or vomiting. She reports she is a smoker. She denies alcohol use. She reports taking Plavix daily.    Past Medical History  Diagnosis Date  . Diabetes type 2, controlled ([place]) 2000  . Hyperlipidemia   . Essential hypertension, benign   . GERD (gastroesophageal reflux disease)   . Fatty liver   . Tubular adenoma of colon 07/2007    Multiple colonic polyps  . Coronary atherosclerosis of native coronary artery     DES x 2 (overlapping) RCA 03/2008 - patent 2011  . Odynophagia     Severe esophagitis on CT 05/2010, treated empirically with Diflucan  . Microalbuminuria   . Venous stasis   . Obesity   . PAD (peripheral artery disease) ([facility])     history of left greater than right lower 70 claudication   Past Surgical History  Procedure Laterality Date  . Cystectomy      Pilonidal  . Colonoscopy w/ polypectomy  07/2007    Tubular adenoma  . Esophagogastroduodenoscopy  04/2010    RMR: Normal esophagus , Stomach D1 and D2   . Coronary stent placement    . Peripheral vascular catheterization Bilateral 09/28/2014     Procedure: Lower Extremity Angiography;  Surgeon: [name], MD;  Location: [facility];  Service: Cardiovascular;  Laterality: Bilateral;  . Peripheral vascular catheterization N/A 09/28/2014    Procedure: Abdominal Aortogram;  Surgeon: [name], MD;  Location: [facility];  Service: Cardiovascular;  Laterality: N/A;  . Peripheral vascular catheterization N/A 10/15/2014    Procedure: Lower Extremity Angiography;  Surgeon: [name], MD;  Location: [facility];  Service: Cardiovascular;  Laterality: N/A;  . Peripheral vascular catheterization  10/15/2014    Procedure: Peripheral Vascular Intervention;  Surgeon: [name], MD;  Location: [facility];  Service: Cardiovascular;;  bilateral iliac stents  . Tubal ligation Bilateral    Family History  Problem Relation Age of Onset  . Bone cancer Father   . Heart disease Maternal Grandmother   . Colon cancer Neg Hx   . Liver disease Neg Hx   . Inflammatory bowel disease Neg Hx   . Hypertension Mother    Social History  Substance Use Topics  . Smoking status: Current Every Day Smoker -- 1.00 packs/day for 47 years    Types: Cigarettes  . Smokeless tobacco: Never Used  . Alcohol Use: No   OB History    No data available     Review of Systems  Constitutional:       Per HPI, otherwise negative  HENT:       Per HPI, otherwise negative  Respiratory:       Per HPI, otherwise negative  Cardiovascular:       Per HPI, otherwise negative  Gastrointestinal: Negative for vomiting.  Endocrine:       Negative aside from HPI  Genitourinary:       Neg aside from HPI   Musculoskeletal:       Per HPI, otherwise negative  Skin: Negative.   Neurological: Negative for syncope.    Allergies  Altace; Biaxin; Niacin and related; and Nsaids  Home Medications   Prior to Admission medications   Medication Sig Start Date End Date Taking? Authorizing Provider  acetaminophen (TYLENOL) 500 MG  tablet Take 1,000 mg by mouth every 6 (six) hours as needed. For pain     Historical Provider, MD  albuterol (PROVENTIL HFA;VENTOLIN HFA) 108 (90 Base) MCG/ACT inhaler 2 puffs every 4 -6 hours prn wheezing 03/26/15   Mikey Kirschner, MD  aspirin EC 81 MG tablet Take 81 mg by mouth daily.      Historical Provider, MD  Blood Glucose Monitoring Suppl w/Device KIT Please dispense Glucose monitor per patient's preference or insurance with testing supplies. Test twice daily. Dx E11.9 04/12/15   Mikey Kirschner, MD  buPROPion [facility] SR) 150 MG 12 hr tablet Take 1 tablet (150 mg total) by mouth daily. 08/30/14   Samuella Cota, MD  canagliflozin [facility]) 100 MG TABS tablet Take 1 tablet by mouth  daily 04/23/15   Mikey Kirschner, MD  clopidogrel (PLAVIX) 75 MG tablet Take 1 tablet (75 mg total) by mouth daily. 11/06/14   [name], MD  docusate sodium (COLACE) 100 MG capsule Take 1 capsule (100 mg total) by mouth 2 (two) times daily. 10/18/14   Brittainy Erie Noe, PA-C  hydrochlorothiazide (MICROZIDE) 12.5 MG capsule Take 1 capsule by mouth  daily 05/15/14   Kathyrn Drown, MD  HYDROcodone-acetaminophen (NORCO/VICODIN) 5-325 MG per tablet Take 1-2 tablets by mouth every 4 (four) hours as needed for severe pain. 10/18/14   Brittainy Erie Noe, PA-C  LANTUS SOLOSTAR 100 UNIT/ML Solostar Pen INJECT SUBCUTANEOUS 50 UNITS AT BEDTIME 04/06/15   Kathyrn Drown, MD  levothyroxine (SYNTHROID, LEVOTHROID) 75 MCG tablet Take 1 tablet by mouth  daily before breakfast 04/23/15   Mikey Kirschner, MD  meclizine (ANTIVERT) 25 MG tablet TAKE 1 TABLET (25 MG TOTAL) BY MOUTH EVERY 6 (SIX) HOURS AS NEEDED FOR DIZZINESS OR NAUSEA. 01/15/14   Kathyrn Drown, MD  metFORMIN (GLUCOPHAGE) 500 MG tablet Take 1 tablet by mouth twice a day. 04/23/15   Mikey Kirschner, MD  nitroGLYCERIN (NITROSTAT) 0.4 MG SL tablet Place 0.4 mg under the tongue every 5 (five) minutes as needed. For chest pains    Historical Provider, MD   oxybutynin (DITROPAN-XL) 10 MG 24 hr tablet Take 2 tablets by mouth  every day 04/23/15   Mikey Kirschner, MD  potassium chloride SA (K-DUR,KLOR-CON) 20 MEQ tablet Take 1 tablet (20 mEq total) by mouth 2 (two) times daily. 09/19/14   Kathyrn Drown, MD  rosuvastatin (CRESTOR) 20 MG tablet Take 1 tablet by mouth  every night at bedtime 04/23/15   Mikey Kirschner, MD   Triage Vitals: BP 132/80 mmHg  Pulse 104  Temp(Src) 97.7 F (36.5 C) (Oral)  Resp 18  Ht 5' 3" (1.6 m)  Wt 145 lb (65.772 kg)  BMI 25.69 kg/m2  SpO2 100% Physical  Exam  Constitutional: She is oriented to person, place, and time. She appears well-developed and well-nourished. No distress.  HENT:  Head: Normocephalic and atraumatic.  Eyes: Conjunctivae and EOM are normal.  Cardiovascular: Normal rate and regular rhythm.   Pulmonary/Chest: Effort normal and breath sounds normal. No stridor. No respiratory distress. She has no wheezes.  Reproducible chest wall tenderness to palpation.  Abdominal: She exhibits no distension.  Musculoskeletal: She exhibits no edema.  Neurological: She is alert and oriented to person, place, and time. No cranial nerve deficit.  Skin: Skin is warm and dry.  Psychiatric: She has a normal mood and affect.  Nursing note and vitals reviewed.   ED Course  Procedures (including critical care time) DIAGNOSTIC STUDIES: Oxygen Saturation is 100% on RA, normal by my interpretation.   COORDINATION OF CARE: 11:54 AM- Will order labs. Pt verbalizes understanding and agrees to plan.  Medications  dexamethasone (DECADRON) injection 10 mg (not administered)    Labs Review Labs Reviewed  I-STAT CHEM 8, ED  Randolm Idol, ED    Imaging Review Dg Chest 2 View  04/27/2015  CLINICAL DATA:  Cough and shortness of Breath EXAM: CHEST  2 VIEW COMPARISON:  10/12/2009 FINDINGS: The heart size and mediastinal contours are within normal limits. Both lungs are clear. The visualized skeletal structures are  unremarkable. IMPRESSION: No active cardiopulmonary disease. Electronically Signed   By: Inez Catalina M.D.   On: 04/27/2015 09:48   I have personally reviewed and evaluated these images and lab results as part of my medical decision-making.   EKG Interpretation   Date/Time:  Tuesday April 27 2015 09:34:48 EDT Ventricular Rate:  101 PR Interval:  150 QRS Duration: 70 QT Interval:  370 QTC Calculation: 479 R Axis:   25 Text Interpretation:  Sinus tachycardia Low voltage QRS Borderline ECG  Sinus tachycardia Artifact Low voltage QRS Abnormal ekg Confirmed by  Carmin Muskrat  MD [id number]) on 04/27/2015 3:35:53 PM     Patient departed prior to receiving her medication, or receiving test results.  MDM  Patient presents with ongoing chest discomfort, cough. Notably, the patient has exquisite tenderness to palpation in the anterior chest suggesting musculoskeletal etiology. However, the patient's physical exam, and EKG were reassuring, she departed prior to the completion of her evaluation.   I personally performed the services described in this documentation, which was scribed in my presence. The recorded information has been reviewed and is accurate.      Carmin Muskrat, MD 04/27/15 1540

## 2015-04-27 NOTE — ED Notes (Signed)
Pt states she is tired of waiting and someone is supposed to be coming to do her blood work.  States she is leaving.

## 2015-05-05 ENCOUNTER — Other Ambulatory Visit: Payer: Self-pay | Admitting: Family Medicine

## 2015-06-04 ENCOUNTER — Encounter (HOSPITAL_COMMUNITY): Payer: Self-pay | Admitting: Emergency Medicine

## 2015-06-04 ENCOUNTER — Emergency Department (HOSPITAL_COMMUNITY)
Admission: EM | Admit: 2015-06-04 | Discharge: 2015-06-05 | Disposition: A | Discharge status: Home / Self Care | Payer: BLUE CROSS/BLUE SHIELD | Attending: Emergency Medicine | Admitting: Emergency Medicine

## 2015-06-04 DIAGNOSIS — E785 Hyperlipidemia, unspecified: Secondary | ICD-10-CM | POA: Insufficient documentation

## 2015-06-04 DIAGNOSIS — Z7982 Long term (current) use of aspirin: Secondary | ICD-10-CM | POA: Insufficient documentation

## 2015-06-04 DIAGNOSIS — R110 Nausea: Secondary | ICD-10-CM | POA: Insufficient documentation

## 2015-06-04 DIAGNOSIS — E1151 Type 2 diabetes mellitus with diabetic peripheral angiopathy without gangrene: Secondary | ICD-10-CM | POA: Insufficient documentation

## 2015-06-04 DIAGNOSIS — I2510 Atherosclerotic heart disease of native coronary artery without angina pectoris: Secondary | ICD-10-CM | POA: Insufficient documentation

## 2015-06-04 DIAGNOSIS — E669 Obesity, unspecified: Secondary | ICD-10-CM | POA: Insufficient documentation

## 2015-06-04 DIAGNOSIS — E119 Type 2 diabetes mellitus without complications: Secondary | ICD-10-CM | POA: Diagnosis not present

## 2015-06-04 DIAGNOSIS — R1114 Bilious vomiting: Secondary | ICD-10-CM

## 2015-06-04 DIAGNOSIS — Z7984 Long term (current) use of oral hypoglycemic drugs: Secondary | ICD-10-CM | POA: Diagnosis not present

## 2015-06-04 DIAGNOSIS — R1013 Epigastric pain: Secondary | ICD-10-CM

## 2015-06-04 DIAGNOSIS — Z79899 Other long term (current) drug therapy: Secondary | ICD-10-CM | POA: Insufficient documentation

## 2015-06-04 LAB — URINE MICROSCOPIC-ADD ON: RBC / HPF: NONE SEEN RBC/hpf (ref 0–5)

## 2015-06-04 LAB — COMPREHENSIVE METABOLIC PANEL
ALT: 17 U/L (ref 14–54)
ANION GAP: 10 (ref 5–15)
AST: 20 U/L (ref 15–41)
Albumin: 4 g/dL (ref 3.5–5.0)
Alkaline Phosphatase: 153 U/L — ABNORMAL HIGH (ref 38–126)
BILIRUBIN TOTAL: 0.5 mg/dL (ref 0.3–1.2)
BUN: 16 mg/dL (ref 6–20)
CHLORIDE: 97 mmol/L — AB (ref 101–111)
CO2: 26 mmol/L (ref 22–32)
Calcium: 9.7 mg/dL (ref 8.9–10.3)
Creatinine, Ser: 1 mg/dL (ref 0.44–1.00)
GFR, EST NON AFRICAN AMERICAN: 59 mL/min — AB (ref >60)
Glucose, Bld: 232 mg/dL — ABNORMAL HIGH (ref 65–99)
POTASSIUM: 4.1 mmol/L (ref 3.5–5.1)
Sodium: 133 mmol/L — ABNORMAL LOW (ref 135–145)
TOTAL PROTEIN: 8.6 g/dL — AB (ref 6.5–8.1)

## 2015-06-04 LAB — URINALYSIS, ROUTINE W REFLEX MICROSCOPIC
Bilirubin Urine: NEGATIVE
Glucose, UA: >1000 mg/dL — AB
Hgb urine dipstick: NEGATIVE
KETONES UR: NEGATIVE mg/dL
LEUKOCYTES UA: NEGATIVE
NITRITE: NEGATIVE
PH: 7.5 (ref 5.0–8.0)
Protein, ur: 100 mg/dL — AB
Specific Gravity, Urine: 1.02 (ref 1.005–1.030)

## 2015-06-04 LAB — CBC
HEMATOCRIT: 39.1 % (ref 36.0–46.0)
HEMOGLOBIN: 13.2 g/dL (ref 12.0–15.0)
MCH: 28.3 pg (ref 26.0–34.0)
MCHC: 33.8 g/dL (ref 30.0–36.0)
MCV: 83.9 fL (ref 78.0–100.0)
Platelets: 392 10*3/uL (ref 150–400)
RBC: 4.66 MIL/uL (ref 3.87–5.11)
RDW: 14.7 % (ref 11.5–15.5)
WBC: 11.2 10*3/uL — AB (ref 4.0–10.5)

## 2015-06-04 LAB — LIPASE, BLOOD: LIPASE: 17 U/L (ref 11–51)

## 2015-06-04 MED ORDER — ONDANSETRON HCL 4 MG/2ML IJ SOLN
4.0000 mg | Freq: Once | INTRAMUSCULAR | Status: AC | PRN
Start: 2015-06-04 — End: 2015-06-04
  Administered 2015-06-04: 4 mg via INTRAVENOUS
  Filled 2015-06-04: qty 2

## 2015-06-04 MED ORDER — ONDANSETRON HCL 4 MG/2ML IJ SOLN
4.0000 mg | Freq: Once | INTRAMUSCULAR | Status: AC
  Administered 2015-06-04: 4 mg via INTRAVENOUS
  Filled 2015-06-04: qty 2

## 2015-06-04 MED ORDER — ONDANSETRON HCL 8 MG PO TABS: 8.0000 mg | ORAL_TABLET | Freq: Three times a day (TID) | ORAL | Status: DC | PRN

## 2015-06-04 MED ORDER — SODIUM CHLORIDE 0.9 % IV BOLUS (SEPSIS)
1000.0000 mL | Freq: Once | INTRAVENOUS | Status: AC
  Administered 2015-06-04: 1000 mL via INTRAVENOUS

## 2015-06-04 MED ORDER — MORPHINE SULFATE (PF) 4 MG/ML IV SOLN
4.0000 mg | Freq: Once | INTRAVENOUS | Status: AC
  Administered 2015-06-04: 4 mg via INTRAVENOUS
  Filled 2015-06-04: qty 1

## 2015-06-04 NOTE — ED Notes (Signed)
Patient complaining of abdominal pain and vomiting starting today.

## 2015-06-04 NOTE — Discharge Instructions (Signed)
Gradually advance your diet over the next 1-2 days. Use Tylenol if needed for pain   Abdominal Pain, Adult Many things can cause abdominal pain. Usually, abdominal pain is not caused by a disease and will improve without treatment. It can often be observed and treated at home. Your health care provider will do a physical exam and possibly order blood tests and X-rays to help determine the seriousness of your pain. However, in many cases, more time must pass before a clear cause of the pain can be found. Before that point, your health care provider may not know if you need more testing or further treatment. HOME CARE INSTRUCTIONS Monitor your abdominal pain for any changes. The following actions may help to alleviate any discomfort you are experiencing:  Only take over-the-counter or prescription medicines as directed by your health care provider.  Do not take laxatives unless directed to do so by your health care provider.  Try a clear liquid diet (broth, tea, or water) as directed by your health care provider. Slowly move to a bland diet as tolerated. SEEK MEDICAL CARE IF:  You have unexplained abdominal pain.  You have abdominal pain associated with nausea or diarrhea.  You have pain when you urinate or have a bowel movement.  You experience abdominal pain that wakes you in the night.  You have abdominal pain that is worsened or improved by eating food.  You have abdominal pain that is worsened with eating fatty foods.  You have a fever. SEEK IMMEDIATE MEDICAL CARE IF:  Your pain does not go away within 2 hours.  You keep throwing up (vomiting).  Your pain is felt only in portions of the abdomen, such as the right side or the left lower portion of the abdomen.  You pass bloody or black tarry stools. MAKE SURE YOU:  Understand these instructions.  Will watch your condition.  Will get help right away if you are not doing well or get worse.   This information is not  intended to replace advice given to you by your health care provider. Make sure you discuss any questions you have with your health care provider.   Document Released: 11/02/2004 Document Revised: 10/14/2014 Document Reviewed: 10/02/2012 Elsevier Interactive Patient Education 2016 Elsevier Inc.  Nausea and Vomiting Nausea is a sick feeling that often comes before throwing up (vomiting). Vomiting is a reflex where stomach contents come out of your mouth. Vomiting can cause severe loss of body fluids (dehydration). Children and elderly adults can become dehydrated quickly, especially if they also have diarrhea. Nausea and vomiting are symptoms of a condition or disease. It is important to find the cause of your symptoms. CAUSES   Direct irritation of the stomach lining. This irritation can result from increased acid production (gastroesophageal reflux disease), infection, food poisoning, taking certain medicines (such as nonsteroidal anti-inflammatory drugs), alcohol use, or tobacco use.  Signals from the brain.These signals could be caused by a headache, heat exposure, an inner ear disturbance, increased pressure in the brain from injury, infection, a tumor, or a concussion, pain, emotional stimulus, or metabolic problems.  An obstruction in the gastrointestinal tract (bowel obstruction).  Illnesses such as diabetes, hepatitis, gallbladder problems, appendicitis, kidney problems, cancer, sepsis, atypical symptoms of a heart attack, or eating disorders.  Medical treatments such as chemotherapy and radiation.  Receiving medicine that makes you sleep (general anesthetic) during surgery. DIAGNOSIS Your caregiver may ask for tests to be done if the problems do not improve after a  few days. Tests may also be done if symptoms are severe or if the reason for the nausea and vomiting is not clear. Tests may include:  Urine tests.  Blood tests.  Stool tests.  Cultures (to look for evidence of  infection).  X-rays or other imaging studies. Test results can help your caregiver make decisions about treatment or the need for additional tests. TREATMENT You need to stay well hydrated. Drink frequently but in small amounts.You may wish to drink water, sports drinks, clear broth, or eat frozen ice pops or gelatin dessert to help stay hydrated.When you eat, eating slowly may help prevent nausea.There are also some antinausea medicines that may help prevent nausea. HOME CARE INSTRUCTIONS   Take all medicine as directed by your caregiver.  If you do not have an appetite, do not force yourself to eat. However, you must continue to drink fluids.  If you have an appetite, eat a normal diet unless your caregiver tells you differently.  Eat a variety of complex carbohydrates (rice, wheat, potatoes, bread), lean meats, yogurt, fruits, and vegetables.  Avoid high-fat foods because they are more difficult to digest.  Drink enough water and fluids to keep your urine clear or pale yellow.  If you are dehydrated, ask your caregiver for specific rehydration instructions. Signs of dehydration may include:  Severe thirst.  Dry lips and mouth.  Dizziness.  Dark urine.  Decreasing urine frequency and amount.  Confusion.  Rapid breathing or pulse. SEEK IMMEDIATE MEDICAL CARE IF:   You have blood or brown flecks (like coffee grounds) in your vomit.  You have black or bloody stools.  You have a severe headache or stiff neck.  You are confused.  You have severe abdominal pain.  You have chest pain or trouble breathing.  You do not urinate at least once every 8 hours.  You develop cold or clammy skin.  You continue to vomit for longer than 24 to 48 hours.  You have a fever. MAKE SURE YOU:   Understand these instructions.  Will watch your condition.  Will get help right away if you are not doing well or get worse.   This information is not intended to replace advice  given to you by your health care provider. Make sure you discuss any questions you have with your health care provider.   Document Released: 01/23/2005 Document Revised: 04/17/2011 Document Reviewed: 06/22/2010 Elsevier Interactive Patient Education [employer].

## 2015-06-04 NOTE — ED Provider Notes (Signed)
CSN: [id number]     Arrival date & time 06/04/15  2053 History  By signing my name below, I, Dora Sims, attest that this documentation has been prepared under the direction and in the presence of physician practitioner, Daleen Bo, MD. Electronically Signed: Dora Sims, Scribe. 06/04/2015. 10:14 PM.     Chief Complaint  Patient presents with  . Abdominal Pain  . Emesis    The history is provided by the patient. No language interpreter was used.     HPI Comments: Rachel [name] is a 65 y.o. female with h/o DM and HLD who presents to the Emergency Department complaining of sudden onset, constant, sharp, severe, abdominal pain since this morning. She endorses associated nausea and vomiting; she has vomited x 4 today. She states that she ate a breakfast biscuit from [employer] around 9AM and her symptoms presented s/p eating. Pt has not had anything to drink since she last vomited but feels like she could retain fluids currently. Pt regularly takes Metformin and insulin (at night) for diabetes. She has not recently been around anyone sick to her knowledge. She denies cough, chest pain, dizziness, hematemesis, fever, diarrhea, or any other associated symptoms.  Past Medical History  Diagnosis Date  . Diabetes type 2, controlled ([place]) 2000  . Hyperlipidemia   . Essential hypertension, benign   . GERD (gastroesophageal reflux disease)   . Fatty liver   . Tubular adenoma of colon 07/2007    Multiple colonic polyps  . Coronary atherosclerosis of native coronary artery     DES x 2 (overlapping) RCA 03/2008 - patent 2011  . Odynophagia     Severe esophagitis on CT 05/2010, treated empirically with Diflucan  . Microalbuminuria   . Venous stasis   . Obesity   . PAD (peripheral artery disease) ([facility])     history of left greater than right lower 70 claudication   Past Surgical History  Procedure Laterality Date  . Cystectomy      Pilonidal  . Colonoscopy w/ polypectomy  07/2007     Tubular adenoma  . Esophagogastroduodenoscopy  04/2010    RMR: Normal esophagus , Stomach D1 and D2   . Coronary stent placement    . Peripheral vascular catheterization Bilateral 09/28/2014    Procedure: Lower Extremity Angiography;  Surgeon: [name], MD;  Location: [facility];  Service: Cardiovascular;  Laterality: Bilateral;  . Peripheral vascular catheterization N/A 09/28/2014    Procedure: Abdominal Aortogram;  Surgeon: [name], MD;  Location: [facility];  Service: Cardiovascular;  Laterality: N/A;  . Peripheral vascular catheterization N/A 10/15/2014    Procedure: Lower Extremity Angiography;  Surgeon: [name], MD;  Location: [facility];  Service: Cardiovascular;  Laterality: N/A;  . Peripheral vascular catheterization  10/15/2014    Procedure: Peripheral Vascular Intervention;  Surgeon: [name], MD;  Location: [facility];  Service: Cardiovascular;;  bilateral iliac stents  . Tubal ligation Bilateral    Family History  Problem Relation Age of Onset  . Bone cancer Father   . Heart disease Maternal Grandmother   . Colon cancer Neg Hx   . Liver disease Neg Hx   . Inflammatory bowel disease Neg Hx   . Hypertension Mother    Social History  Substance Use Topics  . Smoking status: Current Every Day Smoker -- 1.00 packs/day for 47 years    Types: Cigarettes  . Smokeless tobacco: Never Used  . Alcohol Use:  No   OB History    No data available     Review of Systems  Constitutional: Negative for fever.  Respiratory: Negative for cough.   Cardiovascular: Negative for chest pain.  Gastrointestinal: Positive for nausea, vomiting and abdominal pain. Negative for diarrhea.  Neurological: Negative for dizziness.  All other systems reviewed and are negative.  Allergies  Altace; Biaxin; Niacin and related; and Nsaids  Home Medications   Prior to Admission medications   Medication Sig Start Date End Date Taking?  Authorizing Provider  albuterol (PROVENTIL HFA;VENTOLIN HFA) 108 (90 Base) MCG/ACT inhaler 2 puffs every 4 -6 hours prn wheezing Patient taking differently: Inhale 2 puffs into the lungs every 4 (four) hours as needed for wheezing or shortness of breath. 2 puffs every 4 -6 hours prn wheezing 03/26/15  Yes Mikey Kirschner, MD  aspirin EC 81 MG tablet Take 81 mg by mouth daily.     Yes Historical Provider, MD  buPROPion (WELLBUTRIN SR) 150 MG 12 hr tablet Take 1 tablet (150 mg total) by mouth daily. 08/30/14  Yes Samuella Cota, MD  canagliflozin [facility]) 100 MG TABS tablet Take 1 tablet by mouth  daily 04/23/15  Yes Mikey Kirschner, MD  clopidogrel (PLAVIX) 75 MG tablet Take 1 tablet (75 mg total) by mouth daily. 11/06/14  Yes [name], MD  hydrochlorothiazide (MICROZIDE) 12.5 MG capsule Take 1 capsule by mouth  daily 05/15/14  Yes Kathyrn Drown, MD  LANTUS SOLOSTAR 100 UNIT/ML Solostar Pen INJECT SUBCUTANEOUS 50 UNITS AT BEDTIME Patient taking differently: INJECT SUBCUTANEOUS 64 UNITS AT BEDTIME 05/05/15  Yes Kathyrn Drown, MD  levothyroxine (SYNTHROID, LEVOTHROID) 75 MCG tablet Take 1 tablet by mouth  daily before breakfast Patient taking differently: Take 75 mcg by mouth daily before breakfast.  04/23/15  Yes Mikey Kirschner, MD  meclizine (ANTIVERT) 25 MG tablet TAKE 1 TABLET (25 MG TOTAL) BY MOUTH EVERY 6 (SIX) HOURS AS NEEDED FOR DIZZINESS OR NAUSEA. 01/15/14  Yes Kathyrn Drown, MD  metFORMIN (GLUCOPHAGE) 500 MG tablet Take 1 tablet by mouth twice a day. Patient taking differently: Take 500 mg by mouth 2 (two) times daily with a meal. Take 1 tablet by mouth twice a day. 04/23/15  Yes Mikey Kirschner, MD  oxybutynin (DITROPAN-XL) 10 MG 24 hr tablet Take 2 tablets by mouth  every day Patient taking differently: Take 20 mg by mouth daily. Take 2 tablets by mouth  every day 04/23/15  Yes Mikey Kirschner, MD  potassium chloride SA (K-DUR,KLOR-CON) 20 MEQ tablet Take 1 tablet (20 mEq  total) by mouth 2 (two) times daily. Patient taking differently: Take 20 mEq by mouth 2 (two) times daily as needed (when potassium feel low).  09/19/14  Yes Kathyrn Drown, MD  rosuvastatin (CRESTOR) 20 MG tablet Take 1 tablet by mouth  every night at bedtime Patient taking differently: Take 20 mg by mouth at bedtime.  04/23/15  Yes Mikey Kirschner, MD  Blood Glucose Monitoring Suppl w/Device KIT Please dispense Glucose monitor per patient's preference or insurance with testing supplies. Test twice daily. Dx E11.9 04/12/15   Mikey Kirschner, MD  ondansetron (ZOFRAN) 8 MG tablet Take 1 tablet (8 mg total) by mouth every 8 (eight) hours as needed for nausea or vomiting. 06/04/15   Daleen Bo, MD   BP 161/88 mmHg  Pulse 82  Temp(Src) 98.2 F (36.8 C) (Oral)  Resp 20  Ht 5' 3"  (1.6 m)  Wt  155 lb (70.308 kg)  BMI 27.46 kg/m2  SpO2 97% Physical Exam  Constitutional: She is oriented to person, place, and time. She appears well-developed and well-nourished.  HENT:  Head: Normocephalic and atraumatic.  Eyes: Conjunctivae and EOM are normal. Pupils are equal, round, and reactive to light.  Neck: Normal range of motion and phonation normal. Neck supple.  Cardiovascular: Normal rate and regular rhythm.   Pulmonary/Chest: Effort normal and breath sounds normal. She exhibits no tenderness.  Abdominal: Soft. She exhibits no distension. There is tenderness. There is no rebound and no guarding.  Bilateral mild upper quadrant tenderness.  Musculoskeletal: Normal range of motion.  Neurological: She is alert and oriented to person, place, and time. She exhibits normal muscle tone.  Skin: Skin is warm and dry.  Psychiatric: She has a normal mood and affect. Her behavior is normal. Judgment and thought content normal.  Nursing note and vitals reviewed.   ED Course  Procedures (including critical care time)  DIAGNOSTIC STUDIES: Oxygen Saturation is 99% on RA, normal by my interpretation.     COORDINATION OF CARE: 10:38 PM Discussed treatment plan with pt at bedside and pt agreed to plan.  Medications  ondansetron (ZOFRAN) injection 4 mg (4 mg Intravenous Given 06/04/15 2126)  ondansetron (ZOFRAN) injection 4 mg (4 mg Intravenous Given 06/04/15 2223)  morphine 4 MG/ML injection 4 mg (4 mg Intravenous Given 06/04/15 2223)  sodium chloride 0.9 % bolus 1,000 mL (0 mLs Intravenous Stopped 06/05/15 0008)   Patient Vitals for the past 24 hrs:  BP Temp Temp src Pulse Resp SpO2 Height Weight  06/05/15 0006 161/88 mmHg - - 82 20 97 % - -  06/04/15 2109 163/88 mmHg 98.2 F (36.8 C) Oral 97 14 99 % 5' 3"  (1.6 m) 155 lb (70.308 kg)   11:46 PM Reevaluation with update and discussion. After initial assessment and treatment, an updated evaluation reveals she is comfortable now. States her pain and nausea have resolved. Findings discussed with the patient, all questions answered. Tucker Steedley L   Labs Review Labs Reviewed  COMPREHENSIVE METABOLIC PANEL - Abnormal; Notable for the following:    Sodium 133 (*)    Chloride 97 (*)    Glucose, Bld 232 (*)    Total Protein 8.6 (*)    Alkaline Phosphatase 153 (*)    GFR calc non Af Amer 59 (*)    All other components within normal limits  CBC - Abnormal; Notable for the following:    WBC 11.2 (*)    All other components within normal limits  URINALYSIS, ROUTINE W REFLEX MICROSCOPIC (NOT AT [facility]) - Abnormal; Notable for the following:    Glucose, UA >1000 (*)    Protein, ur 100 (*)    All other components within normal limits  URINE MICROSCOPIC-ADD ON - Abnormal; Notable for the following:    Squamous Epithelial / LPF 6-30 (*)    Bacteria, UA RARE (*)    All other components within normal limits  LIPASE, BLOOD   I have personally reviewed and evaluated these lab results as part of my medical decision-making.   MDM   Final diagnoses:  Bilious vomiting with nausea  Epigastric pain     Nonspecific epigastric pain with nausea and  vomiting. Doubt gallbladder disease, or serious bacterial infection. Differential diagnosis includes food poisoning and enteritis.  Nursing Notes Reviewed/ Care Coordinated Applicable Imaging Reviewed Interpretation of Laboratory Data incorporated into ED treatment  The patient appears reasonably screened and/or stabilized for discharge  and I doubt any other medical condition or other [facility] requiring further screening, evaluation, or treatment in the ED at this time prior to discharge.  Plan: Home Medications- Zofran; Home Treatments- rest; return here if the recommended treatment, does not improve the symptoms; Recommended follow up- PCP prn    I personally performed the services described in this documentation, which was scribed in my presence. The recorded information has been reviewed and is accurate.     Daleen Bo, MD 06/05/15 [phone]

## 2015-06-18 ENCOUNTER — Ambulatory Visit (INDEPENDENT_AMBULATORY_CARE_PROVIDER_SITE_OTHER): Payer: BLUE CROSS/BLUE SHIELD | Admitting: Nurse Practitioner

## 2015-06-18 ENCOUNTER — Encounter: Payer: Self-pay | Admitting: Nurse Practitioner

## 2015-06-18 VITALS — BP 120/70 | Temp 98.7°F | Ht 63.0 in | Wt 147.2 lb

## 2015-06-18 DIAGNOSIS — K219 Gastro-esophageal reflux disease without esophagitis: Secondary | ICD-10-CM | POA: Diagnosis not present

## 2015-06-18 DIAGNOSIS — J069 Acute upper respiratory infection, unspecified: Secondary | ICD-10-CM

## 2015-06-18 DIAGNOSIS — E1143 Type 2 diabetes mellitus with diabetic autonomic (poly)neuropathy: Secondary | ICD-10-CM

## 2015-06-18 DIAGNOSIS — Z794 Long term (current) use of insulin: Secondary | ICD-10-CM | POA: Diagnosis not present

## 2015-06-18 LAB — POCT GLUCOSE (DEVICE FOR HOME USE): POC Glucose: 421 mg/dl — AB (ref 70–99)

## 2015-06-18 MED ORDER — INSULIN REGULAR HUMAN 100 UNIT/ML IJ SOLN: INTRAMUSCULAR | Status: DC

## 2015-06-18 MED ORDER — PANTOPRAZOLE SODIUM 40 MG PO TBEC: 40.0000 mg | DELAYED_RELEASE_TABLET | Freq: Every day | ORAL | Status: DC

## 2015-06-18 MED ORDER — CEFDINIR 300 MG PO CAPS: 300.0000 mg | ORAL_CAPSULE | Freq: Two times a day (BID) | ORAL | Status: DC

## 2015-06-18 MED ORDER — ALBUTEROL SULFATE HFA 108 (90 BASE) MCG/ACT IN AERS: INHALATION_SPRAY | RESPIRATORY_TRACT | Status: DC

## 2015-06-18 NOTE — Patient Instructions (Signed)
Food Choices for Gastroesophageal Reflux Disease, Adult When you have gastroesophageal reflux disease (GERD), the foods you eat and your eating habits are very important. Choosing the right foods can help ease the discomfort of GERD. WHAT GENERAL GUIDELINES DO I NEED TO FOLLOW?  Choose fruits, vegetables, whole grains, low-fat dairy products, and low-fat meat, fish, and poultry.  Limit fats such as oils, salad dressings, butter, nuts, and avocado.  Keep a food diary to identify foods that cause symptoms.  Avoid foods that cause reflux. These may be different for different people.  Eat frequent small meals instead of three large meals each day.  Eat your meals slowly, in a relaxed setting.  Limit fried foods.  Cook foods using methods other than frying.  Avoid drinking alcohol.  Avoid drinking large amounts of liquids with your meals.  Avoid bending over or lying down until 2-3 hours after eating. WHAT FOODS ARE NOT RECOMMENDED? The following are some foods and drinks that may worsen your symptoms: Vegetables Tomatoes. Tomato juice. Tomato and spaghetti sauce. Chili peppers. Onion and garlic. Horseradish. Fruits Oranges, grapefruit, and lemon (fruit and juice). Meats High-fat meats, fish, and poultry. This includes hot dogs, ribs, ham, sausage, salami, and bacon. Dairy Whole milk and chocolate milk. Sour cream. Cream. Butter. Ice cream. Cream cheese.  Beverages Coffee and tea, with or without caffeine. Carbonated beverages or energy drinks. Condiments Hot sauce. Barbecue sauce.  Sweets/Desserts Chocolate and cocoa. Donuts. Peppermint and spearmint. Fats and Oils High-fat foods, including French fries and potato chips. Other Vinegar. Strong spices, such as black pepper, white pepper, red pepper, cayenne, curry powder, cloves, ginger, and chili powder. The items listed above may not be a complete list of foods and beverages to avoid. Contact your dietitian for more  information.   This information is not intended to replace advice given to you by your health care provider. Make sure you discuss any questions you have with your health care provider.   Document Released: 01/23/2005 Document Revised: 02/13/2014 Document Reviewed: 11/27/2012 Elsevier Interactive Patient Education 2016 Elsevier Inc.  

## 2015-06-21 ENCOUNTER — Telehealth: Payer: Self-pay | Admitting: Nurse Practitioner

## 2015-06-21 ENCOUNTER — Other Ambulatory Visit: Payer: Self-pay | Admitting: Nurse Practitioner

## 2015-06-21 ENCOUNTER — Encounter: Payer: Self-pay | Admitting: Nurse Practitioner

## 2015-06-21 ENCOUNTER — Other Ambulatory Visit: Payer: Self-pay | Admitting: *Deleted

## 2015-06-21 MED ORDER — ONDANSETRON HCL 8 MG PO TABS: 8.0000 mg | ORAL_TABLET | Freq: Three times a day (TID) | ORAL | Status: DC | PRN

## 2015-06-21 MED ORDER — FLUCONAZOLE 150 MG PO TABS: ORAL_TABLET | ORAL | Status: DC

## 2015-06-21 MED ORDER — INSULIN REGULAR HUMAN 100 UNIT/ML IJ SOLN: INTRAMUSCULAR | Status: DC

## 2015-06-21 NOTE — Telephone Encounter (Signed)
Yes patient needs new rx

## 2015-06-21 NOTE — Telephone Encounter (Signed)
Will send in med for nausea and yeast infection.

## 2015-06-21 NOTE — Progress Notes (Signed)
Subjective:  Presents for complaints of sinus drainage and cough that began about a week ago. No fever. Sore throat. No headache. Runny nose. Frequent nonproductive cough. Slight wheezing only with prolonged coughing spells. No ear pain. No vomiting diarrhea. Mild upper mid abdominal pain at times, has been experiencing quite a bit of heartburn. Chest soreness with cough. Patient is diabetic. Sugars have been elevated at home sometimes above 300. Using her albuterol inhaler about twice per day.  Objective:   BP 120/70 mmHg  Temp(Src) 98.7 F (37.1 C) (Oral)  Ht 5\' 3"  (1.6 m)  Wt 147 lb 4 oz (66.792 kg)  BMI 26.09 kg/m2 NAD. Alert, oriented. TMs clear effusion, no erythema. Pharynx injected with PND noted. Neck supple with mild soft anterior adenopathy. Lungs clear. No wheezing or tachypnea. Heart regular rate rhythm. Abdomen soft nondistended with moderate epigastric area tenderness. Exam otherwise benign. Results for orders placed or performed in visit on 06/18/15  POCT Glucose (Device for Home Use)  Result Value Ref Range   Glucose Fasting, POC  70 - 99 mg/dL   POC Glucose 421 (A) 70 - 99 mg/dl     Assessment:  Problem List Items Addressed This Visit      Endocrine   Diabetes ([place])   Relevant Medications   insulin regular (HUMULIN R) 100 units/mL injection   Other Relevant Orders   POCT Glucose (Device for Home Use) (Completed)    Other Visit Diagnoses    Acute upper respiratory infection    -  Primary    Relevant Medications    cefdinir (OMNICEF) 300 MG capsule    Gastroesophageal reflux disease without esophagitis        Relevant Medications    pantoprazole (PROTONIX) 40 MG tablet      Plan:  Meds ordered this encounter  Medications  . albuterol (PROVENTIL HFA;VENTOLIN HFA) 108 (90 Base) MCG/ACT inhaler    Sig: 2 puffs every 4 -6 hours prn wheezing    Dispense:  1 Inhaler    Refill:  0    Order Specific Question:  Supervising Provider    Answer:  Mikey Kirschner  [[id number]]  . cefdinir (OMNICEF) 300 MG capsule    Sig: Take 1 capsule (300 mg total) by mouth 2 (two) times daily.    Dispense:  20 capsule    Refill:  0    Order Specific Question:  Supervising Provider    Answer:  Mikey Kirschner [[id number]]  . pantoprazole (PROTONIX) 40 MG tablet    Sig: Take 1 tablet (40 mg total) by mouth daily.    Dispense:  30 tablet    Refill:  2    Order Specific Question:  Supervising Provider    Answer:  Mikey Kirschner [[id number]]  . insulin regular (HUMULIN R) 100 units/mL injection    Sig: Use TID per sliding scale up to 15 units at a time    Dispense:  10 mL    Refill:  5    Order Specific Question:  Supervising Provider    Answer:  Mikey Kirschner [[id number]]   OTC meds as directed for congestion and cough. Explained the cough is most likely a combination of sinus drainage and reflux. Start Protonix as directed. Humulin R pen ordered according to sliding scale used and [facility] hospital maximum 15 units 3 times a day before meals. Patient to monitor sugar closely over the weekend and call early next week with results. Continue current dose of Lantus. Callback in  7-10 days if cough and reflux symptoms have not resolved.

## 2015-06-21 NOTE — Telephone Encounter (Signed)
Pt put on omnicef and protonoix. Pt states one med is causing nausea. Pt takes med at the same time.  Sinus symptoms much improved. Reflux about the same.   Pt also wants diflucan for yeast infection.  Also see note below about insulin. Insurance will not cover.

## 2015-06-21 NOTE — Telephone Encounter (Signed)
Patient notified- Patient's insurance prefers Novalin R.  Rx was called into pharmacy for novalin r.

## 2015-06-21 NOTE — Telephone Encounter (Signed)
Just noticed she got some generic Zofran late April. That is great med for nausea. Does she need more?

## 2015-06-21 NOTE — Telephone Encounter (Signed)
But nausea may be worse because of sugars above 400. Rachel [name] worked on getting her regular insulin approved by insurance. Please start ASAP per sliding scale as we discussed. It may take several days to see a difference in reflux on new meds. Continue Maalox or TUMS for breakthrough symptoms until then.

## 2015-06-21 NOTE — Telephone Encounter (Signed)
Pt is calling back to say one of the meds she was put on  Last Friday is making her very nauseated she would like Something called in for the nausea please   The insulin pens you prescribed, her insurance won't cover it But they will cover nova log can you resend that please   [facility]

## 2015-06-22 ENCOUNTER — Other Ambulatory Visit: Payer: Self-pay | Admitting: *Deleted

## 2015-06-22 MED ORDER — ONDANSETRON HCL 8 MG PO TABS: 8.0000 mg | ORAL_TABLET | Freq: Three times a day (TID) | ORAL | Status: DC | PRN

## 2015-06-27 ENCOUNTER — Other Ambulatory Visit: Payer: Self-pay | Admitting: Family Medicine

## 2015-07-02 ENCOUNTER — Telehealth: Payer: Self-pay | Admitting: Family Medicine

## 2015-07-02 MED ORDER — FLUCONAZOLE 150 MG PO TABS: 150.0000 mg | ORAL_TABLET | Freq: Once | ORAL | Status: DC

## 2015-07-02 NOTE — Telephone Encounter (Signed)
Pt is having issue with a yeast infection from her antibiotic use Wants to know if we can call her in diflucan x2 to [facility]

## 2015-07-02 NOTE — Telephone Encounter (Signed)
Diflucan sent to pharmacy per protocol. Left message on voicemail notifying patient.

## 2015-07-16 ENCOUNTER — Other Ambulatory Visit: Payer: Self-pay | Admitting: Cardiovascular Disease

## 2015-07-16 ENCOUNTER — Telehealth: Payer: Self-pay | Admitting: Family Medicine

## 2015-07-16 MED ORDER — CLOPIDOGREL BISULFATE 75 MG PO TABS: 75.0000 mg | ORAL_TABLET | Freq: Every day | ORAL | Status: DC

## 2015-07-16 MED ORDER — BUPROPION HCL ER (SR) 150 MG PO TB12: 150.0000 mg | ORAL_TABLET | Freq: Every day | ORAL | Status: DC

## 2015-07-16 NOTE — Telephone Encounter (Signed)
Pt will need a new prescription    *STAT* If patient is at the pharmacy, call can be transferred to refill team.   1. Which medications need to be refilled? (please list name of each medication and dose if known) Clopedigrel 75 mg   2. Which pharmacy/location (including street and city if local pharmacy) is medication to be sent to?[facility] [address] In [place]    3. Do they need a 30 day or 90 day supply? [place]

## 2015-07-16 NOTE — Telephone Encounter (Signed)
Rx(s) sent to pharmacy electronically. Patient has MD OV 10/22/15

## 2015-07-16 NOTE — Telephone Encounter (Signed)
buPROPion (WELLBUTRIN SR) 150 MG 12 hr tablet  Pt was prescribed this by Hospitalist originally, pt states you refilled It 11/05/14 for her but it was sent to [facility], she is no longer using them  And wanting [place] to send the refill to [facility] please. They are requesting We resend it since they do not have it on file

## 2015-07-19 ENCOUNTER — Other Ambulatory Visit: Payer: Self-pay | Admitting: Cardiovascular Disease

## 2015-07-19 ENCOUNTER — Other Ambulatory Visit: Payer: Self-pay | Admitting: *Deleted

## 2015-07-19 DIAGNOSIS — I739 Peripheral vascular disease, unspecified: Secondary | ICD-10-CM

## 2015-07-19 MED ORDER — METFORMIN HCL 500 MG PO TABS: ORAL_TABLET | ORAL | Status: DC

## 2015-07-23 ENCOUNTER — Encounter (HOSPITAL_COMMUNITY): Payer: Self-pay

## 2015-07-23 ENCOUNTER — Emergency Department (HOSPITAL_COMMUNITY): Payer: BLUE CROSS/BLUE SHIELD

## 2015-07-23 ENCOUNTER — Inpatient Hospital Stay (HOSPITAL_COMMUNITY): Admission: RE | Admit: 2015-07-23 | Payer: BLUE CROSS/BLUE SHIELD | Source: Ambulatory Visit

## 2015-07-23 ENCOUNTER — Encounter (HOSPITAL_COMMUNITY): Payer: Self-pay | Admitting: Cardiovascular Disease

## 2015-07-23 ENCOUNTER — Inpatient Hospital Stay (HOSPITAL_COMMUNITY)
Admission: EM | Admit: 2015-07-23 | Discharge: 2015-07-24 | DRG: 446 | Disposition: A | Discharge status: Home / Self Care | Payer: BLUE CROSS/BLUE SHIELD | Attending: Internal Medicine | Admitting: Internal Medicine

## 2015-07-23 ENCOUNTER — Inpatient Hospital Stay (HOSPITAL_COMMUNITY): Payer: BLUE CROSS/BLUE SHIELD

## 2015-07-23 DIAGNOSIS — Z7902 Long term (current) use of antithrombotics/antiplatelets: Secondary | ICD-10-CM

## 2015-07-23 DIAGNOSIS — E039 Hypothyroidism, unspecified: Secondary | ICD-10-CM | POA: Diagnosis present

## 2015-07-23 DIAGNOSIS — K810 Acute cholecystitis: Secondary | ICD-10-CM | POA: Diagnosis not present

## 2015-07-23 DIAGNOSIS — Z86010 Personal history of colonic polyps: Secondary | ICD-10-CM | POA: Diagnosis not present

## 2015-07-23 DIAGNOSIS — I2510 Atherosclerotic heart disease of native coronary artery without angina pectoris: Secondary | ICD-10-CM

## 2015-07-23 DIAGNOSIS — K8000 Calculus of gallbladder with acute cholecystitis without obstruction: Principal | ICD-10-CM | POA: Diagnosis present

## 2015-07-23 DIAGNOSIS — Z79899 Other long term (current) drug therapy: Secondary | ICD-10-CM | POA: Diagnosis not present

## 2015-07-23 DIAGNOSIS — R109 Unspecified abdominal pain: Secondary | ICD-10-CM | POA: Diagnosis present

## 2015-07-23 DIAGNOSIS — I10 Essential (primary) hypertension: Secondary | ICD-10-CM | POA: Diagnosis present

## 2015-07-23 DIAGNOSIS — E1143 Type 2 diabetes mellitus with diabetic autonomic (poly)neuropathy: Secondary | ICD-10-CM | POA: Diagnosis not present

## 2015-07-23 DIAGNOSIS — Z955 Presence of coronary angioplasty implant and graft: Secondary | ICD-10-CM | POA: Diagnosis not present

## 2015-07-23 DIAGNOSIS — K219 Gastro-esophageal reflux disease without esophagitis: Secondary | ICD-10-CM | POA: Diagnosis present

## 2015-07-23 DIAGNOSIS — E119 Type 2 diabetes mellitus without complications: Secondary | ICD-10-CM | POA: Diagnosis present

## 2015-07-23 DIAGNOSIS — E785 Hyperlipidemia, unspecified: Secondary | ICD-10-CM | POA: Diagnosis present

## 2015-07-23 DIAGNOSIS — Z8249 Family history of ischemic heart disease and other diseases of the circulatory system: Secondary | ICD-10-CM

## 2015-07-23 DIAGNOSIS — E1142 Type 2 diabetes mellitus with diabetic polyneuropathy: Secondary | ICD-10-CM

## 2015-07-23 DIAGNOSIS — Z808 Family history of malignant neoplasm of other organs or systems: Secondary | ICD-10-CM | POA: Diagnosis not present

## 2015-07-23 DIAGNOSIS — Z794 Long term (current) use of insulin: Secondary | ICD-10-CM

## 2015-07-23 DIAGNOSIS — F17210 Nicotine dependence, cigarettes, uncomplicated: Secondary | ICD-10-CM | POA: Diagnosis present

## 2015-07-23 DIAGNOSIS — Z7982 Long term (current) use of aspirin: Secondary | ICD-10-CM

## 2015-07-23 DIAGNOSIS — E876 Hypokalemia: Secondary | ICD-10-CM | POA: Diagnosis present

## 2015-07-23 DIAGNOSIS — I739 Peripheral vascular disease, unspecified: Secondary | ICD-10-CM | POA: Diagnosis present

## 2015-07-23 DIAGNOSIS — K760 Fatty (change of) liver, not elsewhere classified: Secondary | ICD-10-CM | POA: Diagnosis present

## 2015-07-23 LAB — CBC WITH DIFFERENTIAL/PLATELET
BASOS ABS: 0 10*3/uL (ref 0.0–0.1)
BASOS PCT: 0 %
EOS ABS: 0.1 10*3/uL (ref 0.0–0.7)
EOS PCT: 1 %
HCT: 36.1 % (ref 36.0–46.0)
Hemoglobin: 12 g/dL (ref 12.0–15.0)
Lymphocytes Relative: 15 %
Lymphs Abs: 1.6 10*3/uL (ref 0.7–4.0)
MCH: 28.1 pg (ref 26.0–34.0)
MCHC: 33.2 g/dL (ref 30.0–36.0)
MCV: 84.5 fL (ref 78.0–100.0)
Monocytes Absolute: 0.4 10*3/uL (ref 0.1–1.0)
Monocytes Relative: 4 %
Neutro Abs: 8.6 10*3/uL — ABNORMAL HIGH (ref 1.7–7.7)
Neutrophils Relative %: 80 %
PLATELETS: 326 10*3/uL (ref 150–400)
RBC: 4.27 MIL/uL (ref 3.87–5.11)
RDW: 14.9 % (ref 11.5–15.5)
WBC: 10.8 10*3/uL — AB (ref 4.0–10.5)

## 2015-07-23 LAB — URINALYSIS, ROUTINE W REFLEX MICROSCOPIC
Bilirubin Urine: NEGATIVE
HGB URINE DIPSTICK: NEGATIVE
Ketones, ur: NEGATIVE mg/dL
LEUKOCYTES UA: NEGATIVE
Nitrite: NEGATIVE
Protein, ur: 30 mg/dL — AB
SPECIFIC GRAVITY, URINE: 1.01 (ref 1.005–1.030)
pH: 6 (ref 5.0–8.0)

## 2015-07-23 LAB — COMPREHENSIVE METABOLIC PANEL
ALT: 10 U/L — AB (ref 14–54)
AST: 13 U/L — AB (ref 15–41)
Albumin: 3.8 g/dL (ref 3.5–5.0)
Alkaline Phosphatase: 148 U/L — ABNORMAL HIGH (ref 38–126)
Anion gap: 9 (ref 5–15)
BUN: 13 mg/dL (ref 6–20)
CHLORIDE: 101 mmol/L (ref 101–111)
CO2: 24 mmol/L (ref 22–32)
CREATININE: 0.87 mg/dL (ref 0.44–1.00)
Calcium: 9.2 mg/dL (ref 8.9–10.3)
GFR calc Af Amer: >60 mL/min (ref >60)
GFR calc non Af Amer: >60 mL/min (ref >60)
Glucose, Bld: 133 mg/dL — ABNORMAL HIGH (ref 65–99)
POTASSIUM: 3.1 mmol/L — AB (ref 3.5–5.1)
SODIUM: 134 mmol/L — AB (ref 135–145)
Total Bilirubin: 0.3 mg/dL (ref 0.3–1.2)
Total Protein: 8 g/dL (ref 6.5–8.1)

## 2015-07-23 LAB — GLUCOSE, CAPILLARY
GLUCOSE-CAPILLARY: 71 mg/dL (ref 65–99)
Glucose-Capillary: 226 mg/dL — ABNORMAL HIGH (ref 65–99)
Glucose-Capillary: 255 mg/dL — ABNORMAL HIGH (ref 65–99)

## 2015-07-23 LAB — URINE MICROSCOPIC-ADD ON
BACTERIA UA: NONE SEEN
WBC, UA: NONE SEEN WBC/hpf (ref 0–5)

## 2015-07-23 LAB — LIPASE, BLOOD: Lipase: 17 U/L (ref 11–51)

## 2015-07-23 MED ORDER — PIPERACILLIN-TAZOBACTAM 3.375 G IVPB
3.3750 g | Freq: Three times a day (TID) | INTRAVENOUS | Status: DC
  Administered 2015-07-23 – 2015-07-24 (×3): 3.375 g via INTRAVENOUS
  Filled 2015-07-23 (×3): qty 50

## 2015-07-23 MED ORDER — POTASSIUM CHLORIDE CRYS ER 20 MEQ PO TBCR
40.0000 meq | EXTENDED_RELEASE_TABLET | Freq: Once | ORAL | Status: AC
  Administered 2015-07-23: 40 meq via ORAL
  Filled 2015-07-23: qty 2

## 2015-07-23 MED ORDER — ONDANSETRON HCL 4 MG/2ML IJ SOLN
4.0000 mg | Freq: Once | INTRAMUSCULAR | Status: AC
  Administered 2015-07-23: 4 mg via INTRAVENOUS
  Filled 2015-07-23: qty 2

## 2015-07-23 MED ORDER — OXYCODONE HCL 5 MG PO TABS: 5.0000 mg | ORAL_TABLET | ORAL | Status: DC | PRN

## 2015-07-23 MED ORDER — SODIUM CHLORIDE 0.9 % IV BOLUS (SEPSIS)
1000.0000 mL | Freq: Once | INTRAVENOUS | Status: AC
  Administered 2015-07-23: 1000 mL via INTRAVENOUS

## 2015-07-23 MED ORDER — LEVOTHYROXINE SODIUM 75 MCG PO TABS
75.0000 ug | ORAL_TABLET | Freq: Every day | ORAL | Status: DC
  Administered 2015-07-23 – 2015-07-24 (×2): 75 ug via ORAL
  Filled 2015-07-23 (×2): qty 1

## 2015-07-23 MED ORDER — MORPHINE SULFATE (PF) 4 MG/ML IV SOLN: 4.0000 mg | INTRAVENOUS | Status: DC | PRN

## 2015-07-23 MED ORDER — PIPERACILLIN-TAZOBACTAM 3.375 G IVPB
3.3750 g | Freq: Once | INTRAVENOUS | Status: AC
  Administered 2015-07-23: 3.375 g via INTRAVENOUS

## 2015-07-23 MED ORDER — DIATRIZOATE MEGLUMINE & SODIUM 66-10 % PO SOLN
ORAL | Status: AC
  Filled 2015-07-23: qty 30

## 2015-07-23 MED ORDER — IOPAMIDOL (ISOVUE-300) INJECTION 61%
100.0000 mL | Freq: Once | INTRAVENOUS | Status: AC | PRN
  Administered 2015-07-23: 100 mL via INTRAVENOUS

## 2015-07-23 MED ORDER — SODIUM CHLORIDE 0.9 % IV SOLN: INTRAVENOUS | Status: DC

## 2015-07-23 MED ORDER — INSULIN ASPART 100 UNIT/ML ~~LOC~~ SOLN
0.0000 [IU] | Freq: Three times a day (TID) | SUBCUTANEOUS | Status: DC
  Administered 2015-07-23 – 2015-07-24 (×2): 3 [IU] via SUBCUTANEOUS
  Administered 2015-07-24: 2 [IU] via SUBCUTANEOUS

## 2015-07-23 MED ORDER — HEPARIN SODIUM (PORCINE) 5000 UNIT/ML IJ SOLN
5000.0000 [IU] | Freq: Three times a day (TID) | INTRAMUSCULAR | Status: DC
  Administered 2015-07-23 – 2015-07-24 (×4): 5000 [IU] via SUBCUTANEOUS
  Filled 2015-07-23 (×5): qty 1

## 2015-07-23 MED ORDER — MORPHINE SULFATE (PF) 4 MG/ML IV SOLN
4.0000 mg | Freq: Once | INTRAVENOUS | Status: AC
  Administered 2015-07-23: 4 mg via INTRAVENOUS
  Filled 2015-07-23: qty 1

## 2015-07-23 MED ORDER — ALBUTEROL SULFATE (2.5 MG/3ML) 0.083% IN NEBU: 2.5000 mg | INHALATION_SOLUTION | RESPIRATORY_TRACT | Status: DC | PRN

## 2015-07-23 MED ORDER — POTASSIUM CHLORIDE 20 MEQ PO PACK
40.0000 meq | PACK | Freq: Once | ORAL | Status: DC
  Filled 2015-07-23: qty 2

## 2015-07-23 MED ORDER — ONDANSETRON HCL 4 MG/2ML IJ SOLN: 4.0000 mg | Freq: Three times a day (TID) | INTRAMUSCULAR | Status: AC | PRN

## 2015-07-23 MED ORDER — KCL IN DEXTROSE-NACL 40-5-0.45 MEQ/L-%-% IV SOLN
INTRAVENOUS | Status: DC
  Administered 2015-07-23 – 2015-07-24 (×2): via INTRAVENOUS

## 2015-07-23 MED ORDER — SODIUM CHLORIDE 0.9% FLUSH
3.0000 mL | Freq: Two times a day (BID) | INTRAVENOUS | Status: DC
  Administered 2015-07-23: 3 mL via INTRAVENOUS

## 2015-07-23 MED ORDER — MORPHINE SULFATE (PF) 2 MG/ML IV SOLN
2.0000 mg | INTRAVENOUS | Status: DC | PRN
  Administered 2015-07-23: 2 mg via INTRAVENOUS
  Filled 2015-07-23: qty 1

## 2015-07-23 MED ORDER — PIPERACILLIN-TAZOBACTAM 3.375 G IVPB
INTRAVENOUS | Status: AC
  Filled 2015-07-23: qty 50

## 2015-07-23 MED ORDER — SODIUM CHLORIDE 0.9 % IV SOLN
INTRAVENOUS | Status: DC
  Administered 2015-07-23: 08:00:00 via INTRAVENOUS

## 2015-07-23 MED ORDER — INSULIN GLARGINE 100 UNIT/ML ~~LOC~~ SOLN
32.0000 [IU] | Freq: Every day | SUBCUTANEOUS | Status: DC
  Administered 2015-07-23: 32 [IU] via SUBCUTANEOUS
  Filled 2015-07-23 (×3): qty 0.32

## 2015-07-23 MED ORDER — INSULIN ASPART 100 UNIT/ML ~~LOC~~ SOLN
0.0000 [IU] | SUBCUTANEOUS | Status: DC
  Administered 2015-07-23: 3 [IU] via SUBCUTANEOUS

## 2015-07-23 NOTE — Progress Notes (Signed)
Pt transferring to room 324 as med/surg.pt alert and oriented. Iv infusing w/o difficulty. Pain is resolved. Transfer report called to shanna rn on 300.

## 2015-07-23 NOTE — ED Provider Notes (Signed)
TIME SEEN: 4:10 AM  CHIEF COMPLAINT: Abdominal pain, nausea and vomiting  HPI: Patient is a 65 year old female with history of hypertension, hyperlipidemia, diabetes, CAD who presents emergency department with epigastric, right upper quadrant abdominal pain that started around 11:30 PM last night. Describes as a sharp, stabbing pain. She has had nausea and vomiting. No diarrhea, bloody stool or melena. No fevers but has had chills. States she has had similar symptoms once before and was told it could be "acid". She has never had any imaging of her gallbladder. No history of abdominal surgeries. States she last ate macaroni and cheese at 2 PM. No bitter, burning or sour taste in her mouth. No chest pain or shortness of breath. Denies heavy NSAID use, alcohol use. No sick contacts or recent travel. Denies dysuria, hematuria, vaginal bleeding or discharge.  PCP - Dr. Wolfgang Phoenix  ROS: See HPI Constitutional: no fever  Eyes: no drainage  ENT: no runny nose   Cardiovascular:  no chest pain  Resp: no SOB  GI:  vomiting GU: no dysuria Integumentary: no rash  Allergy: no hives  Musculoskeletal: no leg swelling  Neurological: no slurred speech ROS otherwise negative  PAST MEDICAL HISTORY/PAST SURGICAL HISTORY:  Past Medical History  Diagnosis Date  . Diabetes type 2, controlled ([place]) 2000  . Hyperlipidemia   . Essential hypertension, benign   . GERD (gastroesophageal reflux disease)   . Fatty liver   . Tubular adenoma of colon 07/2007    Multiple colonic polyps  . Coronary atherosclerosis of native coronary artery     DES x 2 (overlapping) RCA 03/2008 - patent 2011  . Odynophagia     Severe esophagitis on CT 05/2010, treated empirically with Diflucan  . Microalbuminuria   . Venous stasis   . Obesity   . PAD (peripheral artery disease) (HCC)     history of left greater than right lower 70 claudication    MEDICATIONS:  Prior to Admission medications   Medication Sig Start Date End Date  Taking? Authorizing Provider  albuterol (PROVENTIL HFA;VENTOLIN HFA) 108 (90 Base) MCG/ACT inhaler 2 puffs every 4 -6 hours prn wheezing 06/18/15   Nilda Simmer, NP  aspirin EC 81 MG tablet Take 81 mg by mouth daily.      Historical Provider, MD  Blood Glucose Monitoring Suppl w/Device KIT Please dispense Glucose monitor per patient's preference or insurance with testing supplies. Test twice daily. Dx E11.9 04/12/15   Mikey Kirschner, MD  buPROPion [facility] SR) 150 MG 12 hr tablet Take 1 tablet (150 mg total) by mouth daily. 07/16/15   Mikey Kirschner, MD  canagliflozin [facility]) 100 MG TABS tablet Take 1 tablet by mouth  daily 04/23/15   Mikey Kirschner, MD  cefdinir (OMNICEF) 300 MG capsule Take 1 capsule (300 mg total) by mouth 2 (two) times daily. 06/18/15   Nilda Simmer, NP  clopidogrel (PLAVIX) 75 MG tablet Take 1 tablet (75 mg total) by mouth daily. 07/16/15   [name], MD  fluconazole (DIFLUCAN) 150 MG tablet One po qd prn yeast infection; may repeat in 3-4 days if needed 06/21/15   Nilda Simmer, NP  fluconazole (DIFLUCAN) 150 MG tablet Take 1 tablet (150 mg total) by mouth once. 3 days apart 07/02/15   Mikey Kirschner, MD  hydrochlorothiazide (MICROZIDE) 12.5 MG capsule Take 1 capsule by mouth  daily 05/15/14   Kathyrn Drown, MD  insulin regular (HUMULIN R) 100 units/mL injection Use TID per sliding  scale up to 15 units at a time 06/21/15   Nilda Simmer, NP  LANTUS SOLOSTAR 100 UNIT/ML Solostar Pen INJECT SUBCUTANEOUS 50 UNITS AT BEDTIME Patient taking differently: INJECT SUBCUTANEOUS 64 UNITS AT BEDTIME 05/05/15   Kathyrn Drown, MD  levothyroxine (SYNTHROID, LEVOTHROID) 75 MCG tablet Take 1 tablet by mouth  daily before breakfast Patient taking differently: Take 75 mcg by mouth daily before breakfast.  04/23/15   Mikey Kirschner, MD  meclizine (ANTIVERT) 25 MG tablet TAKE 1 TABLET (25 MG TOTAL) BY MOUTH EVERY 6 (SIX) HOURS AS NEEDED FOR DIZZINESS OR NAUSEA. 01/15/14    Kathyrn Drown, MD  metFORMIN (GLUCOPHAGE) 500 MG tablet Take 1 tablet by mouth twice a day. 07/19/15   Mikey Kirschner, MD  ondansetron (ZOFRAN) 8 MG tablet Take 1 tablet (8 mg total) by mouth every 8 (eight) hours as needed for nausea or vomiting. 06/22/15   Nilda Simmer, NP  oxybutynin (DITROPAN-XL) 10 MG 24 hr tablet Take 2 tablets by mouth  every day Patient taking differently: Take 20 mg by mouth daily. Take 2 tablets by mouth  every day 04/23/15   Mikey Kirschner, MD  pantoprazole (PROTONIX) 40 MG tablet Take 1 tablet (40 mg total) by mouth daily. 06/18/15   Nilda Simmer, NP  potassium chloride SA (K-DUR,KLOR-CON) 20 MEQ tablet Take 1 tablet (20 mEq total) by mouth 2 (two) times daily. Patient taking differently: Take 20 mEq by mouth 2 (two) times daily as needed (when potassium feel low).  09/19/14   Kathyrn Drown, MD  rosuvastatin (CRESTOR) 20 MG tablet Take 1 tablet by mouth  every night at bedtime Patient taking differently: Take 20 mg by mouth at bedtime.  04/23/15   Mikey Kirschner, MD    ALLERGIES:  Allergies  Allergen Reactions  . Altace [Ramipril] Cough  . Biaxin [Clarithromycin]     Fatigue  . Niacin And Related Other (See Comments)    Flush - felt like on fire.  . Nsaids Other (See Comments)    feverish    SOCIAL HISTORY:  Social History  Substance Use Topics  . Smoking status: Current Every Day Smoker -- 1.00 packs/day for 47 years    Types: Cigarettes  . Smokeless tobacco: Never Used  . Alcohol Use: No    FAMILY HISTORY: Family History  Problem Relation Age of Onset  . Bone cancer Father   . Heart disease Maternal Grandmother   . Colon cancer Neg Hx   . Liver disease Neg Hx   . Inflammatory bowel disease Neg Hx   . Hypertension Mother     EXAM: BP 151/87 mmHg  Pulse 90  Temp(Src) 98.2 F (36.8 C) (Oral)  Resp 16  Ht '5\' 3"'$  (1.6 m)  Wt 147 lb (66.679 kg)  BMI 26.05 kg/m2  SpO2 99% CONSTITUTIONAL: Alert and oriented and responds  appropriately to questions. Well-appearing; well-nourished HEAD: Normocephalic EYES: Conjunctivae clear, PERRL ENT: normal nose; no rhinorrhea; moist mucous membranes NECK: Supple, no meningismus, no LAD  CARD: RRR; S1 and S2 appreciated; no murmurs, no clicks, no rubs, no gallops RESP: Normal chest excursion without splinting or tachypnea; breath sounds clear and equal bilaterally; no wheezes, no rhonchi, no rales, no hypoxia or respiratory distress, speaking full sentences ABD/GI: Normal bowel sounds; non-distended; soft, Tender to palpation diffusely throughout the abdomen but worse in the epigastric region and right upper quadrant, positive Murphy sign, she does have voluntary guarding however in the epigastric region,  no rebound, no peritoneal signs BACK:  The back appears normal and is non-tender to palpation, there is no CVA tenderness EXT: Normal ROM in all joints; non-tender to palpation; no edema; normal capillary refill; no cyanosis, no calf tenderness or swelling    SKIN: Normal color for age and race; warm; no rash NEURO: Moves all extremities equally, sensation to light touch intact diffusely, cranial nerves II through XII intact PSYCH: The patient's mood and manner are appropriate. Grooming and personal hygiene are appropriate.  MEDICAL DECISION MAKING: Patient here with diffuse abdominal pain worse in the epigastric region and right upper quadrant. Positive Murphy sign on exam. No fever however. Differential diagnosis includes gastritis, cholecystitis, cholelithiasis, pancreatitis, colitis. Less likely bowel obstruction, appendicitis. We'll obtain labs, urine and obtain a CT of her abdomen and pelvis. Unable to obtain an ultrasound at this time at this hospital. We'll give IV fluids, morphine, Zofran. Reports morphine is helping with her pain in the past.  ED PROGRESS: 6:30 AM  CT scan shows probable acute cholecystitis. She has gallstones with wall thickening and edema. There is also  calcification in the region of the head of the pancreas which may represent a common bile duct stone although no ductal dilatation and no changes in her LFTs. Discussed with Dr. Arnoldo Morale on call with general surgery. He recommends admission to medicine, given IV Zosyn and obtaining a right upper quadrant ultrasound to see if there truly is a stone as she may need gastroenterology consult. He will see patient in consult today. She has been updated with this plan and agrees with it.  Will keep NPO.   6:45 AM  Discussed patient's case with hospitalist, Dr. Myna Hidalgo.  Recommend admission to inpatient, medical bed.  I will place holding orders per their request. Patient and family (if present) updated with plan. Care transferred to hospitalist service.  I reviewed all nursing notes, vitals, pertinent old records, EKGs, labs, imaging (as available).   [place], DO 07/23/15 [phone]

## 2015-07-23 NOTE — H&P (Signed)
TRH H&P   Patient Demographics:    Rachel [name], is a 65 y.o. female  MRN: [id number]   DOB - Nov 02, 1950  Admit Date - 07/23/2015  Outpatient Primary MD for the patient is [name], MD  Patient coming from: Home  Chief Complaint  Patient presents with  . Abdominal Pain      HPI:    Rachel [name]  is a 65 y.o. female,  female, with history of hypertension, hyperlipidemia, diabetes, CAD who presents emergency department with epigastric, right upper quadrant abdominal pain, patient reports pain is intermittent, over the last 2 weeks, initially thought that he it's due to viral illness, started on PPI by PCP as it was thought to be secondary to reflux, but pain was significant yesterday evening, came to ED, described it as sharp, stabbing, company by nausea and vomiting, denies diarrhea, coffee-ground emesis, melena or bright red blood per rectum - In ED CT head was significant for acute cholecystitis, right upper quadrant confirmed the same, patient underwent to the Center hospitalist service for need of intervention by surgery. Lipase within normal limits, LFTs within normal limits but only mildly elevated alkaline phosphatase at 148    Review of systems:    In addition to the HPI above,  No Fever-chills, No Headache, No changes with Vision or hearing, No problems swallowing food or Liquids, No Chest pain, Cough or Shortness of Breath, Complaints of abdominal pain, nausea and vomiting, Bowel movements are regular, No Blood in stool or Urine, No dysuria, No new skin rashes or bruises, No new joints pains-aches,  No new weakness, tingling, numbness in any extremity, No recent weight gain or loss, No polyuria, polydypsia or polyphagia, No significant Mental Stressors.  A full 10 point Review of Systems was done, except as stated above, all other Review of Systems were  negative.   With Past History of the following :    Past Medical History  Diagnosis Date  . Diabetes type 2, controlled ([place]) 2000  . Hyperlipidemia   . Essential hypertension, benign   . GERD (gastroesophageal reflux disease)   . Fatty liver   . Tubular adenoma of colon 07/2007    Multiple colonic polyps  . Coronary atherosclerosis of native coronary artery     DES x 2 (overlapping) RCA 03/2008 - patent 2011  . Odynophagia     Severe esophagitis on CT 05/2010, treated empirically with Diflucan  . Microalbuminuria   . Venous stasis   . Obesity   . PAD (peripheral artery disease) (HCC)     history of left greater than right lower 70 claudication      Past Surgical History  Procedure Laterality Date  . Cystectomy      Pilonidal  . Colonoscopy w/ polypectomy  07/2007    Tubular adenoma  . Esophagogastroduodenoscopy  04/2010    RMR: Normal esophagus , Stomach D1 and D2   .  Coronary stent placement    . Peripheral vascular catheterization Bilateral 09/28/2014    Procedure: Lower Extremity Angiography;  Surgeon: [name], MD;  Location: [facility];  Service: Cardiovascular;  Laterality: Bilateral;  . Peripheral vascular catheterization N/A 09/28/2014    Procedure: Abdominal Aortogram;  Surgeon: [name], MD;  Location: [facility];  Service: Cardiovascular;  Laterality: N/A;  . Peripheral vascular catheterization N/A 10/15/2014    Procedure: Lower Extremity Angiography;  Surgeon: [name], MD;  Location: [facility];  Service: Cardiovascular;  Laterality: N/A;  . Peripheral vascular catheterization  10/15/2014    Procedure: Peripheral Vascular Intervention;  Surgeon: [name], MD;  Location: [facility];  Service: Cardiovascular;;  bilateral iliac stents  . Tubal ligation Bilateral   . Back surgery        Social History:     Social History  Substance Use Topics  . Smoking status: Current Every Day Smoker -- 1.00  packs/day for 47 years    Types: Cigarettes  . Smokeless tobacco: Never Used  . Alcohol Use: No     Lives - Home  Mobility - Independent     Family History :     Family History  Problem Relation Age of Onset  . Bone cancer Father   . Heart disease Maternal Grandmother   . Colon cancer Neg Hx   . Liver disease Neg Hx   . Inflammatory bowel disease Neg Hx   . Hypertension Mother       Home Medications:   Prior to Admission medications   Medication Sig Start Date End Date Taking? Authorizing Provider  albuterol (PROVENTIL HFA;VENTOLIN HFA) 108 (90 Base) MCG/ACT inhaler 2 puffs every 4 -6 hours prn wheezing 06/18/15   Nilda Simmer, NP  aspirin EC 81 MG tablet Take 81 mg by mouth daily.      Historical Provider, MD  Blood Glucose Monitoring Suppl w/Device KIT Please dispense Glucose monitor per patient's preference or insurance with testing supplies. Test twice daily. Dx E11.9 04/12/15   Mikey Kirschner, MD  buPROPion [facility] SR) 150 MG 12 hr tablet Take 1 tablet (150 mg total) by mouth daily. 07/16/15   Mikey Kirschner, MD  canagliflozin [facility]) 100 MG TABS tablet Take 1 tablet by mouth  daily 04/23/15   Mikey Kirschner, MD  cefdinir (OMNICEF) 300 MG capsule Take 1 capsule (300 mg total) by mouth 2 (two) times daily. 06/18/15   Nilda Simmer, NP  clopidogrel (PLAVIX) 75 MG tablet Take 1 tablet (75 mg total) by mouth daily. 07/16/15   [name], MD  fluconazole (DIFLUCAN) 150 MG tablet One po qd prn yeast infection; may repeat in 3-4 days if needed 06/21/15   Nilda Simmer, NP  fluconazole (DIFLUCAN) 150 MG tablet Take 1 tablet (150 mg total) by mouth once. 3 days apart 07/02/15   Mikey Kirschner, MD  hydrochlorothiazide (MICROZIDE) 12.5 MG capsule Take 1 capsule by mouth  daily 05/15/14   Kathyrn Drown, MD  insulin regular (HUMULIN R) 100 units/mL injection Use TID per sliding scale up to 15 units at a time 06/21/15   Nilda Simmer, NP  LANTUS SOLOSTAR 100  UNIT/ML Solostar Pen INJECT SUBCUTANEOUS 50 UNITS AT BEDTIME Patient taking differently: INJECT SUBCUTANEOUS 64 UNITS AT BEDTIME 05/05/15   Kathyrn Drown, MD  levothyroxine (SYNTHROID, LEVOTHROID) 75 MCG tablet Take 1 tablet by mouth  daily before breakfast  Patient taking differently: Take 75 mcg by mouth daily before breakfast.  04/23/15   Mikey Kirschner, MD  meclizine (ANTIVERT) 25 MG tablet TAKE 1 TABLET (25 MG TOTAL) BY MOUTH EVERY 6 (SIX) HOURS AS NEEDED FOR DIZZINESS OR NAUSEA. 01/15/14   Kathyrn Drown, MD  metFORMIN (GLUCOPHAGE) 500 MG tablet Take 1 tablet by mouth twice a day. 07/19/15   Mikey Kirschner, MD  ondansetron (ZOFRAN) 8 MG tablet Take 1 tablet (8 mg total) by mouth every 8 (eight) hours as needed for nausea or vomiting. 06/22/15   Nilda Simmer, NP  oxybutynin (DITROPAN-XL) 10 MG 24 hr tablet Take 2 tablets by mouth  every day Patient taking differently: Take 20 mg by mouth daily. Take 2 tablets by mouth  every day 04/23/15   Mikey Kirschner, MD  pantoprazole (PROTONIX) 40 MG tablet Take 1 tablet (40 mg total) by mouth daily. 06/18/15   Nilda Simmer, NP  potassium chloride SA (K-DUR,KLOR-CON) 20 MEQ tablet Take 1 tablet (20 mEq total) by mouth 2 (two) times daily. Patient taking differently: Take 20 mEq by mouth 2 (two) times daily as needed (when potassium feel low).  09/19/14   Kathyrn Drown, MD  rosuvastatin (CRESTOR) 20 MG tablet Take 1 tablet by mouth  every night at bedtime Patient taking differently: Take 20 mg by mouth at bedtime.  04/23/15   Mikey Kirschner, MD     Allergies:     Allergies  Allergen Reactions  . Altace [Ramipril] Cough  . Biaxin [Clarithromycin]     Fatigue  . Niacin And Related Other (See Comments)    Flush - felt like on fire.  . Nsaids Other (See Comments)    feverish     Physical Exam:   Vitals  Blood pressure 125/76, pulse 81, temperature 97.7 F (36.5 C), temperature source Oral, resp. rate 16, height '5\' 3"'$  (1.6 m), weight  66.679 kg (147 lb), SpO2 98 %.   1. General Well-nourished female lying in bed in NAD,   2. Normal affect and insight, Not Suicidal or Homicidal, Awake Alert, Oriented X 3.  3. No F.N deficits, ALL C.Nerves Intact, Strength 5/5 all 4 extremities, Sensation intact all 4 extremities, Plantars down going.  4. Ears and Eyes appear Normal, Conjunctivae clear, PERRLA. Moist Oral Mucosa.  5. Supple Neck, No JVD, No cervical lymphadenopathy appriciated, No Carotid Bruits.  6. Symmetrical Chest wall movement, Good air movement bilaterally, CTAB.  7. RRR, No Gallops, Rubs or Murmurs, No Parasternal Heave.  8. Positive Bowel Sounds, Abdomen Soft, Right upper quadrant tenderness, but no rebound or guarding  9. No Cyanosis, Normal Skin Turgor, No Skin Rash or Bruise.  10. Good muscle tone, joints appear normal , no effusions, Normal ROM.  11. No Palpable Lymph Nodes in Neck or Axillae   Data Review:    CBC  Recent Labs Lab 07/23/15 0430  WBC 10.8*  HGB 12.0  HCT 36.1  PLT 326  MCV 84.5  MCH 28.1  MCHC 33.2  RDW 14.9  LYMPHSABS 1.6  MONOABS 0.4  EOSABS 0.1  BASOSABS 0.0   ------------------------------------------------------------------------------------------------------------------  Chemistries   Recent Labs Lab 07/23/15 0430  NA 134*  K 3.1*  CL 101  CO2 24  GLUCOSE 133*  BUN 13  CREATININE 0.87  CALCIUM 9.2  AST 13*  ALT 10*  ALKPHOS 148*  BILITOT 0.3   ------------------------------------------------------------------------------------------------------------------ estimated creatinine clearance is 61.5 mL/min (by C-G formula based on Cr of 0.87). ------------------------------------------------------------------------------------------------------------------  No results for input(s): TSH, T4TOTAL, T3FREE, THYROIDAB in the last 72 hours.  Invalid input(s): FREET3  Coagulation profile No results for input(s): INR, PROTIME in the last 168  hours. ------------------------------------------------------------------------------------------------------------------- No results for input(s): DDIMER in the last 72 hours. -------------------------------------------------------------------------------------------------------------------  Cardiac Enzymes No results for input(s): CKMB, TROPONINI, MYOGLOBIN in the last 168 hours.  Invalid input(s): CK ------------------------------------------------------------------------------------------------------------------ No results found for: BNP   ---------------------------------------------------------------------------------------------------------------  Urinalysis    Component Value Date/Time   COLORURINE YELLOW 07/23/2015 0610   APPEARANCEUR CLEAR 07/23/2015 0610   LABSPEC 1.010 07/23/2015 0610   PHURINE 6.0 07/23/2015 0610   GLUCOSEU >1000* 07/23/2015 0610   HGBUR NEGATIVE 07/23/2015 0610   BILIRUBINUR NEGATIVE 07/23/2015 0610   KETONESUR NEGATIVE 07/23/2015 0610   PROTEINUR 30* 07/23/2015 0610   NITRITE NEGATIVE 07/23/2015 0610   LEUKOCYTESUR NEGATIVE 07/23/2015 0610    ----------------------------------------------------------------------------------------------------------------   Imaging Results:    Ct Abdomen Pelvis W Contrast  07/23/2015  CLINICAL DATA:  Upper abdominal pain beginning around 2300 hours. Seen here for similar pain a couple of weeks ago and told it was heartburn. Diffuse abdominal and epigastric region pain and right upper quadrant pain. EXAM: CT ABDOMEN AND PELVIS WITH CONTRAST TECHNIQUE: Multidetector CT imaging of the abdomen and pelvis was performed using the standard protocol following bolus administration of intravenous contrast. CONTRAST:  15m ISOVUE-300 IOPAMIDOL (ISOVUE-300) INJECTION 61% COMPARISON:  05/23/2010 FINDINGS: Atelectasis in the lung bases. Cholelithiasis with small stone in the gallbladder. There is evidence of pericholecystic edema.  Changes suggest cholecystitis. Small focal calcification in the head of the pancreas which was not present previously. This may represent a common duct stone. No bile duct dilatation. The liver, spleen, pancreas, adrenal glands, kidneys, inferior vena cava, and retroperitoneal lymph nodes are unremarkable. Calcification throughout the abdominal aorta and branch vessels with bilateral iliac artery stents. Diffuse calcification in the external iliac and common femoral arteries may represent areas of significant stenosis. Stomach, small bowel, and colon are not abnormally distended. No free air or free fluid in the abdomen. Pelvis: Appendix is normal. Bladder wall is not thickened. Uterus and ovaries are not enlarged. No free or loculated pelvic fluid collections. No pelvic mass or lymphadenopathy. Degenerative changes in the spine. IMPRESSION: Cholelithiasis with gallbladder wall thickening and edema possibly representing acute cholecystitis. Calcification in the region of the head of the pancreas may represent a common duct stone although there is no significant bile duct dilatation. Extensive vascular calcifications. No evidence bowel obstruction or inflammation. Electronically Signed   By: WLucienne CapersM.D.   On: 07/23/2015 06:20   [place]dom[place] Limited Ruq  07/23/2015  CLINICAL DATA:  Changes suggestive of cholecystitis on recent CT examination. EXAM: [place] LIMITED - RIGHT UPPER QUADRANT COMPARISON:  None. FINDINGS: Gallbladder: Gallbladder wall thickening to 9 mm is noted. Mild pericholecystic fluid is noted. Few small gallstones are seen. Common bile duct: Diameter: 5 mm. Liver: No focal lesion identified. Within normal limits in parenchymal echogenicity. IMPRESSION: Gallbladder wall thickening and pericholecystic fluid with evidence of cholelithiasis. These changes are consistent with acute cholecystitis. Electronically Signed   By: MInez CatalinaM.D.   On: 07/23/2015 08:00    My personal review of  EKG: Pending   Assessment & Plan:    Active Problems:   Diabetes ([place]   Coronary atherosclerosis of native coronary artery   Peripheral arterial disease (HCC)   Essential hypertension, benign   Hypothyroidism   Hypokalemia   Acute cholecystitis   Acute cholecystitis: -  Patient presents with complaints of abdominal pain, nausea and vomiting, CT abdomen pelvis with evidence of acute cholecystitis, upper quadrant ultrasound showing the same, no common bile duct stones, diabetes 5 mm. - Continue nothing by mouth, continue with IV fluids, continue with IV Zosyn, surgery consulted - Lipase within normal limits  Coronary artery disease - Denies any chest pain or shortness of breath, continue to hold aspirin and Plavix in anticipation of surgery - We'll check EKG  Peripheral vascular disease - hold aspirin and Plavix in anticipation of surgery  Diabetes mellitus - We'll start an insulin sliding scale, will resume Lantus half home dose as patient is nothing by mouth  Hyperlipidemia - Resume statin after surgery  Hypertension - Continue to hold hydrochlorothiazide, resume after surgery  Hypothyroidism - Continue with Synthroid  Hypokalemia - Repleted   DVT Prophylaxis Heparin -   AM Labs Ordered, also please review Full Orders  Family Communication: Admission, patients condition and plan of care including tests being ordered have been discussed with the patient who indicate understanding and agree with the plan and Code Status.  Code Status Full  Likely DC to Home  Condition GUARDED   Consults called:Surgery  Admission status: inpatient  Time spent in minutes : 60 minutes   ELGERGAWY, DAWOOD M.D on 07/23/2015 at 9:23 AM  Between 7am to 7pm - Pager - [phone]. After 7pm go to [email or web address] - password [facility]  [facility] - Office  [phone]

## 2015-07-23 NOTE — Consult Note (Signed)
Reason for Consult: Cholecystitis, cholelithiasis Referring Physician: Dr. Alto Denver is an 65 y.o. female.  HPI: Patient is a 65 year old black female who presented emergency room with a 24-hour history of worsening right upper quadrant abdominal pain, nausea, and vomiting. She states she has had a previous episode, but has never been diagnosed with cholelithiasis. She was seen the emergency room and CT scan of the abdomen revealed cholecystitis with cholelithiasis, question choledocholithiasis. Ultrasound shows cholelithiasis with a thickened gallbladder wall and mild pericholecystic fluid. The common bile duct is within normal limits. She was admitted to the hospital for further evaluation treatment. She states the morphine has relieved her pain. She is hungry. No fever, chills, jaundice have been noted. She does have intermittent fatty food intolerance, though she is a diabetic. She is on Plavix due to stent placement in the iliac vessels in September 2016. She was supposed to see the vascular surgeon later today.  Past Medical History  Diagnosis Date  . Diabetes type 2, controlled ([place]) 2000  . Hyperlipidemia   . Essential hypertension, benign   . GERD (gastroesophageal reflux disease)   . Fatty liver   . Tubular adenoma of colon 07/2007    Multiple colonic polyps  . Coronary atherosclerosis of native coronary artery     DES x 2 (overlapping) RCA 03/2008 - patent 2011  . Odynophagia     Severe esophagitis on CT 05/2010, treated empirically with Diflucan  . Microalbuminuria   . Venous stasis   . Obesity   . PAD (peripheral artery disease) ([facility])     history of left greater than right lower 70 claudication    Past Surgical History  Procedure Laterality Date  . Cystectomy      Pilonidal  . Colonoscopy w/ polypectomy  07/2007    Tubular adenoma  . Esophagogastroduodenoscopy  04/2010    RMR: Normal esophagus , Stomach D1 and D2   . Coronary stent placement    .  Peripheral vascular catheterization Bilateral 09/28/2014    Procedure: Lower Extremity Angiography;  Surgeon: [name], MD;  Location: [facility];  Service: Cardiovascular;  Laterality: Bilateral;  . Peripheral vascular catheterization N/A 09/28/2014    Procedure: Abdominal Aortogram;  Surgeon: [name], MD;  Location: [facility];  Service: Cardiovascular;  Laterality: N/A;  . Peripheral vascular catheterization N/A 10/15/2014    Procedure: Lower Extremity Angiography;  Surgeon: [name], MD;  Location: [facility];  Service: Cardiovascular;  Laterality: N/A;  . Peripheral vascular catheterization  10/15/2014    Procedure: Peripheral Vascular Intervention;  Surgeon: [name], MD;  Location: [facility];  Service: Cardiovascular;;  bilateral iliac stents  . Tubal ligation Bilateral   . Back surgery      Family History  Problem Relation Age of Onset  . Bone cancer Father   . Heart disease Maternal Grandmother   . Colon cancer Neg Hx   . Liver disease Neg Hx   . Inflammatory bowel disease Neg Hx   . Hypertension Mother     Social History:  reports that she has been smoking Cigarettes.  She has a 47 pack-year smoking history. She has never used smokeless tobacco. She reports that she does not drink alcohol or use illicit drugs.  Allergies:  Allergies  Allergen Reactions  . Altace [Ramipril] Cough  . Biaxin [Clarithromycin]     Fatigue  . Niacin And Related Other (See Comments)  Flush - felt like on fire.  . Nsaids Other (See Comments)    feverish    Medications: I have reviewed the patient's current medications.  Results for orders placed or performed during the hospital encounter of 07/23/15 (from the past 48 hour(s))  CBC with Differential     Status: Abnormal   Collection Time: 07/23/15  4:30 AM  Result Value Ref Range   WBC 10.8 (H) 4.0 - 10.5 K/uL   RBC 4.27 3.87 - 5.11 MIL/uL   Hemoglobin 12.0 12.0 - 15.0 g/dL    HCT 36.1 36.0 - 46.0 %   MCV 84.5 78.0 - 100.0 fL   MCH 28.1 26.0 - 34.0 pg   MCHC 33.2 30.0 - 36.0 g/dL   RDW 14.9 11.5 - 15.5 %   Platelets 326 150 - 400 K/uL   Neutrophils Relative % 80 %   Neutro Abs 8.6 (H) 1.7 - 7.7 K/uL   Lymphocytes Relative 15 %   Lymphs Abs 1.6 0.7 - 4.0 K/uL   Monocytes Relative 4 %   Monocytes Absolute 0.4 0.1 - 1.0 K/uL   Eosinophils Relative 1 %   Eosinophils Absolute 0.1 0.0 - 0.7 K/uL   Basophils Relative 0 %   Basophils Absolute 0.0 0.0 - 0.1 K/uL  Comprehensive metabolic panel     Status: Abnormal   Collection Time: 07/23/15  4:30 AM  Result Value Ref Range   Sodium 134 (L) 135 - 145 mmol/L   Potassium 3.1 (L) 3.5 - 5.1 mmol/L   Chloride 101 101 - 111 mmol/L   CO2 24 22 - 32 mmol/L   Glucose, Bld 133 (H) 65 - 99 mg/dL   BUN 13 6 - 20 mg/dL   Creatinine, Ser 0.87 0.44 - 1.00 mg/dL   Calcium 9.2 8.9 - 10.3 mg/dL   Total Protein 8.0 6.5 - 8.1 g/dL   Albumin 3.8 3.5 - 5.0 g/dL   AST 13 (L) 15 - 41 U/L   ALT 10 (L) 14 - 54 U/L   Alkaline Phosphatase 148 (H) 38 - 126 U/L   Total Bilirubin 0.3 0.3 - 1.2 mg/dL   GFR calc non Af Amer >60 >60 mL/min   GFR calc Af Amer >60 >60 mL/min    Comment: (NOTE) The eGFR has been calculated using the CKD EPI equation. This calculation has not been validated in all clinical situations. eGFR's persistently <60 mL/min signify possible Chronic Kidney Disease.    Anion gap 9 5 - 15  Lipase, blood     Status: None   Collection Time: 07/23/15  4:30 AM  Result Value Ref Range   Lipase 17 11 - 51 U/L  Urinalysis, Routine w reflex microscopic (not at [facility])     Status: Abnormal   Collection Time: 07/23/15  6:10 AM  Result Value Ref Range   Color, Urine YELLOW YELLOW   APPearance CLEAR CLEAR   Specific Gravity, Urine 1.010 1.005 - 1.030   pH 6.0 5.0 - 8.0   Glucose, UA >1000 (A) NEGATIVE mg/dL   Hgb urine dipstick NEGATIVE NEGATIVE   Bilirubin Urine NEGATIVE NEGATIVE   Ketones, ur NEGATIVE NEGATIVE mg/dL    Protein, ur 30 (A) NEGATIVE mg/dL   Nitrite NEGATIVE NEGATIVE   Leukocytes, UA NEGATIVE NEGATIVE  Urine microscopic-add on     Status: Abnormal   Collection Time: 07/23/15  6:10 AM  Result Value Ref Range   Squamous Epithelial / LPF TOO NUMEROUS TO COUNT (A) NONE SEEN   WBC, UA  NONE SEEN 0 - 5 WBC/hpf   RBC / HPF 0-5 0 - 5 RBC/hpf   Bacteria, UA NONE SEEN NONE SEEN    Ct Abdomen Pelvis W Contrast  07/23/2015  CLINICAL DATA:  Upper abdominal pain beginning around 2300 hours. Seen here for similar pain a couple of weeks ago and told it was heartburn. Diffuse abdominal and epigastric region pain and right upper quadrant pain. EXAM: CT ABDOMEN AND PELVIS WITH CONTRAST TECHNIQUE: Multidetector CT imaging of the abdomen and pelvis was performed using the standard protocol following bolus administration of intravenous contrast. CONTRAST:  163m ISOVUE-300 IOPAMIDOL (ISOVUE-300) INJECTION 61% COMPARISON:  05/23/2010 FINDINGS: Atelectasis in the lung bases. Cholelithiasis with small stone in the gallbladder. There is evidence of pericholecystic edema. Changes suggest cholecystitis. Small focal calcification in the head of the pancreas which was not present previously. This may represent a common duct stone. No bile duct dilatation. The liver, spleen, pancreas, adrenal glands, kidneys, inferior vena cava, and retroperitoneal lymph nodes are unremarkable. Calcification throughout the abdominal aorta and branch vessels with bilateral iliac artery stents. Diffuse calcification in the external iliac and common femoral arteries may represent areas of significant stenosis. Stomach, small bowel, and colon are not abnormally distended. No free air or free fluid in the abdomen. Pelvis: Appendix is normal. Bladder wall is not thickened. Uterus and ovaries are not enlarged. No free or loculated pelvic fluid collections. No pelvic mass or lymphadenopathy. Degenerative changes in the spine. IMPRESSION: Cholelithiasis with  gallbladder wall thickening and edema possibly representing acute cholecystitis. Calcification in the region of the head of the pancreas may represent a common duct stone although there is no significant bile duct dilatation. Extensive vascular calcifications. No evidence bowel obstruction or inflammation. Electronically Signed   By: WLucienne CapersM.D.   On: 07/23/2015 06:20   [place]dom[place] Limited Ruq  07/23/2015  CLINICAL DATA:  Changes suggestive of cholecystitis on recent CT examination. EXAM: [place] LIMITED - RIGHT UPPER QUADRANT COMPARISON:  None. FINDINGS: Gallbladder: Gallbladder wall thickening to 9 mm is noted. Mild pericholecystic fluid is noted. Few small gallstones are seen. Common bile duct: Diameter: 5 mm. Liver: No focal lesion identified. Within normal limits in parenchymal echogenicity. IMPRESSION: Gallbladder wall thickening and pericholecystic fluid with evidence of cholelithiasis. These changes are consistent with acute cholecystitis. Electronically Signed   By: MInez CatalinaM.D.   On: 07/23/2015 08:00    ROS:  Pertinent items noted in HPI and remainder of comprehensive ROS otherwise negative.  Blood pressure 125/76, pulse 81, temperature 97.7 F (36.5 C), temperature source Oral, resp. rate 16, height 5' 3"  (1.6 m), weight 66.679 kg (147 lb), SpO2 98 %. 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 Head is normocephalic, atraumatic. Eyes are negative with no scleral icterus present. Neck is supple without lymphadenopathy or JVD. No carotid bruits appreciated. Lungs clear auscultation with good breath sounds bilaterally. Heart examination reveals regular rate and rhythm without S3, S4, murmurs. Abdomen soft with mild tenderness to deep palpation in the right upper quadrant. No hepatosplenomegaly, masses, hernias are identified. Normal bowel sounds appreciated.  Assessment/Plan: Impression: Cholecystitis with cholelithiasis. Liver enzyme tests  are within normal limits. The patient is on Plavix which precludes immediate surgical intervention. Plan: We'll go ahead and advance to carb modified diet. If she tolerates this over the next 24 hours, she can be discharged. She has been scheduled for an elective outpatient laparoscopic cholecystectomy on 07/30/2015. The risks and benefits of the  procedure including bleeding, infection, hepatobiliary injury, the possibility of an open procedure were fully explained to the patient, who gave informed consent. I have adjusted her IV fluids for potassium supplementation. She states she takes potassium supplementation at home. She should stop her Plavix until surgery.  Aaleigha Bozza A 07/23/2015, 11:00 AM

## 2015-07-23 NOTE — Progress Notes (Signed)
Pharmacy Antibiotic Note  Rachel [name] is a 65 y.o. female admitted on 07/23/2015 with intra-abdominal infection.  Pharmacy has been consulted for ZOSYN dosing.  Plan:  Zosyn 3.375gm IV q8h, EID Monitor labs, renal fxn, progress and c/s  Height: 5\' 3"  (160 cm) Weight: 147 lb (66.679 kg) IBW/kg (Calculated) : 52.4  Temp (24hrs), Avg:98 F (36.7 C), Min:97.7 F (36.5 C), Max:98.2 F (36.8 C)   Recent Labs Lab 07/23/15 0430  WBC 10.8*  CREATININE 0.87    Estimated Creatinine Clearance: 61.5 mL/min (by C-G formula based on Cr of 0.87).    Allergies  Allergen Reactions  . Altace [Ramipril] Cough  . Biaxin [Clarithromycin]     Fatigue  . Niacin And Related Other (See Comments)    Flush - felt like on fire.  . Nsaids Other (See Comments)    feverish   Antimicrobials this admission: ZOSYN 6/16 >>   No results found for this or any previous visit (from the past 240 hour(s)).  Thank you for allowing pharmacy to be a part of this patient's care.  Hart Robinsons A 07/23/2015 11:15 AM

## 2015-07-23 NOTE — ED Notes (Signed)
Pt c/o of upper abdominal pain which began around 2300. States was seen here for similar pain "a couple of weeks ago" and told it was "heartburn". States they didn't send her home on any prescriptions but gave her Morphine while she was here and she is "waiting on more".

## 2015-07-23 NOTE — H&P (Deleted)
TRH H&P   Patient Demographics:    Rachel [name], is a 65 y.o. female  MRN: [id number]   DOB - 1950/11/25  Admit Date - 07/23/2015  Outpatient Primary MD for the patient is [name], MD  Patient coming from: Home  Chief Complaint  Patient presents with  . Abdominal Pain      HPI:    Rachel [name]  is a 65 y.o. female,  female, with history of hypertension, hyperlipidemia, diabetes, CAD who presents emergency department with epigastric, right upper quadrant abdominal pain, patient reports pain is intermittent, over the last 2 weeks, initially thought that he it's due to viral illness, started on PPI by PCP as it was thought to be secondary to reflux, but pain was significant yesterday evening, came to ED, described it as sharp, stabbing, company by nausea and vomiting, denies diarrhea, coffee-ground emesis, melena or bright red blood per rectum - In ED CT head was significant for acute cholecystitis, right upper quadrant confirmed the same, patient underwent to the Center hospitalist service for need of intervention by surgery. Lipase within normal limits, LFTs within normal limits but only mildly elevated alkaline phosphatase at 148    Review of systems:    In addition to the HPI above,  No Fever-chills, No Headache, No changes with Vision or hearing, No problems swallowing food or Liquids, No Chest pain, Cough or Shortness of Breath, Complaints of abdominal pain, nausea and vomiting, Bowel movements are regular, No Blood in stool or Urine, No dysuria, No new skin rashes or bruises, No new joints pains-aches,  No new weakness, tingling, numbness in any extremity, No recent weight gain or loss, No polyuria, polydypsia or polyphagia, No significant Mental Stressors.  A full 10 point Review of Systems was done, except as stated above, all other Review of Systems were  negative.   With Past History of the following :    Past Medical History  Diagnosis Date  . Diabetes type 2, controlled ([place]) 2000  . Hyperlipidemia   . Essential hypertension, benign   . GERD (gastroesophageal reflux disease)   . Fatty liver   . Tubular adenoma of colon 07/2007    Multiple colonic polyps  . Coronary atherosclerosis of native coronary artery     DES x 2 (overlapping) RCA 03/2008 - patent 2011  . Odynophagia     Severe esophagitis on CT 05/2010, treated empirically with Diflucan  . Microalbuminuria   . Venous stasis   . Obesity   . PAD (peripheral artery disease) (HCC)     history of left greater than right lower 70 claudication      Past Surgical History  Procedure Laterality Date  . Cystectomy      Pilonidal  . Colonoscopy w/ polypectomy  07/2007    Tubular adenoma  . Esophagogastroduodenoscopy  04/2010    RMR: Normal esophagus , Stomach D1 and D2   .  Coronary stent placement    . Peripheral vascular catheterization Bilateral 09/28/2014    Procedure: Lower Extremity Angiography;  Surgeon: [name], MD;  Location: [facility];  Service: Cardiovascular;  Laterality: Bilateral;  . Peripheral vascular catheterization N/A 09/28/2014    Procedure: Abdominal Aortogram;  Surgeon: [name], MD;  Location: [facility];  Service: Cardiovascular;  Laterality: N/A;  . Peripheral vascular catheterization N/A 10/15/2014    Procedure: Lower Extremity Angiography;  Surgeon: [name], MD;  Location: [facility];  Service: Cardiovascular;  Laterality: N/A;  . Peripheral vascular catheterization  10/15/2014    Procedure: Peripheral Vascular Intervention;  Surgeon: [name], MD;  Location: [facility];  Service: Cardiovascular;;  bilateral iliac stents  . Tubal ligation Bilateral   . Back surgery        Social History:     Social History  Substance Use Topics  . Smoking status: Current Every Day Smoker -- 1.00  packs/day for 47 years    Types: Cigarettes  . Smokeless tobacco: Never Used  . Alcohol Use: No     Lives - Home  Mobility - Independent     Family History :     Family History  Problem Relation Age of Onset  . Bone cancer Father   . Heart disease Maternal Grandmother   . Colon cancer Neg Hx   . Liver disease Neg Hx   . Inflammatory bowel disease Neg Hx   . Hypertension Mother       Home Medications:   Prior to Admission medications   Medication Sig Start Date End Date Taking? Authorizing Provider  albuterol (PROVENTIL HFA;VENTOLIN HFA) 108 (90 Base) MCG/ACT inhaler 2 puffs every 4 -6 hours prn wheezing 06/18/15   Nilda Simmer, NP  aspirin EC 81 MG tablet Take 81 mg by mouth daily.      Historical Provider, MD  Blood Glucose Monitoring Suppl w/Device KIT Please dispense Glucose monitor per patient's preference or insurance with testing supplies. Test twice daily. Dx E11.9 04/12/15   Mikey Kirschner, MD  buPROPion [facility] SR) 150 MG 12 hr tablet Take 1 tablet (150 mg total) by mouth daily. 07/16/15   Mikey Kirschner, MD  canagliflozin [facility]) 100 MG TABS tablet Take 1 tablet by mouth  daily 04/23/15   Mikey Kirschner, MD  cefdinir (OMNICEF) 300 MG capsule Take 1 capsule (300 mg total) by mouth 2 (two) times daily. 06/18/15   Nilda Simmer, NP  clopidogrel (PLAVIX) 75 MG tablet Take 1 tablet (75 mg total) by mouth daily. 07/16/15   [name], MD  fluconazole (DIFLUCAN) 150 MG tablet One po qd prn yeast infection; may repeat in 3-4 days if needed 06/21/15   Nilda Simmer, NP  fluconazole (DIFLUCAN) 150 MG tablet Take 1 tablet (150 mg total) by mouth once. 3 days apart 07/02/15   Mikey Kirschner, MD  hydrochlorothiazide (MICROZIDE) 12.5 MG capsule Take 1 capsule by mouth  daily 05/15/14   Kathyrn Drown, MD  insulin regular (HUMULIN R) 100 units/mL injection Use TID per sliding scale up to 15 units at a time 06/21/15   Nilda Simmer, NP  LANTUS SOLOSTAR 100  UNIT/ML Solostar Pen INJECT SUBCUTANEOUS 50 UNITS AT BEDTIME Patient taking differently: INJECT SUBCUTANEOUS 64 UNITS AT BEDTIME 05/05/15   Kathyrn Drown, MD  levothyroxine (SYNTHROID, LEVOTHROID) 75 MCG tablet Take 1 tablet by mouth  daily before breakfast  Patient taking differently: Take 75 mcg by mouth daily before breakfast.  04/23/15   Mikey Kirschner, MD  meclizine (ANTIVERT) 25 MG tablet TAKE 1 TABLET (25 MG TOTAL) BY MOUTH EVERY 6 (SIX) HOURS AS NEEDED FOR DIZZINESS OR NAUSEA. 01/15/14   Kathyrn Drown, MD  metFORMIN (GLUCOPHAGE) 500 MG tablet Take 1 tablet by mouth twice a day. 07/19/15   Mikey Kirschner, MD  ondansetron (ZOFRAN) 8 MG tablet Take 1 tablet (8 mg total) by mouth every 8 (eight) hours as needed for nausea or vomiting. 06/22/15   Nilda Simmer, NP  oxybutynin (DITROPAN-XL) 10 MG 24 hr tablet Take 2 tablets by mouth  every day Patient taking differently: Take 20 mg by mouth daily. Take 2 tablets by mouth  every day 04/23/15   Mikey Kirschner, MD  pantoprazole (PROTONIX) 40 MG tablet Take 1 tablet (40 mg total) by mouth daily. 06/18/15   Nilda Simmer, NP  potassium chloride SA (K-DUR,KLOR-CON) 20 MEQ tablet Take 1 tablet (20 mEq total) by mouth 2 (two) times daily. Patient taking differently: Take 20 mEq by mouth 2 (two) times daily as needed (when potassium feel low).  09/19/14   Kathyrn Drown, MD  rosuvastatin (CRESTOR) 20 MG tablet Take 1 tablet by mouth  every night at bedtime Patient taking differently: Take 20 mg by mouth at bedtime.  04/23/15   Mikey Kirschner, MD     Allergies:     Allergies  Allergen Reactions  . Altace [Ramipril] Cough  . Biaxin [Clarithromycin]     Fatigue  . Niacin And Related Other (See Comments)    Flush - felt like on fire.  . Nsaids Other (See Comments)    feverish     Physical Exam:   Vitals  Blood pressure 125/76, pulse 81, temperature 97.7 F (36.5 C), temperature source Oral, resp. rate 16, height '5\' 3"'$  (1.6 m), weight  66.679 kg (147 lb), SpO2 98 %.   1. General Well-nourished female lying in bed in NAD,   2. Normal affect and insight, Not Suicidal or Homicidal, Awake Alert, Oriented X 3.  3. No F.N deficits, ALL C.Nerves Intact, Strength 5/5 all 4 extremities, Sensation intact all 4 extremities, Plantars down going.  4. Ears and Eyes appear Normal, Conjunctivae clear, PERRLA. Moist Oral Mucosa.  5. Supple Neck, No JVD, No cervical lymphadenopathy appriciated, No Carotid Bruits.  6. Symmetrical Chest wall movement, Good air movement bilaterally, CTAB.  7. RRR, No Gallops, Rubs or Murmurs, No Parasternal Heave.  8. Positive Bowel Sounds, Abdomen Soft, Right upper quadrant tenderness, but no rebound or guarding  9. No Cyanosis, Normal Skin Turgor, No Skin Rash or Bruise.  10. Good muscle tone, joints appear normal , no effusions, Normal ROM.  11. No Palpable Lymph Nodes in Neck or Axillae   Data Review:    CBC  Recent Labs Lab 07/23/15 0430  WBC 10.8*  HGB 12.0  HCT 36.1  PLT 326  MCV 84.5  MCH 28.1  MCHC 33.2  RDW 14.9  LYMPHSABS 1.6  MONOABS 0.4  EOSABS 0.1  BASOSABS 0.0   ------------------------------------------------------------------------------------------------------------------  Chemistries   Recent Labs Lab 07/23/15 0430  NA 134*  K 3.1*  CL 101  CO2 24  GLUCOSE 133*  BUN 13  CREATININE 0.87  CALCIUM 9.2  AST 13*  ALT 10*  ALKPHOS 148*  BILITOT 0.3   ------------------------------------------------------------------------------------------------------------------ estimated creatinine clearance is 61.5 mL/min (by C-G formula based on Cr of 0.87). ------------------------------------------------------------------------------------------------------------------  No results for input(s): TSH, T4TOTAL, T3FREE, THYROIDAB in the last 72 hours.  Invalid input(s): FREET3  Coagulation profile No results for input(s): INR, PROTIME in the last 168  hours. ------------------------------------------------------------------------------------------------------------------- No results for input(s): DDIMER in the last 72 hours. -------------------------------------------------------------------------------------------------------------------  Cardiac Enzymes No results for input(s): CKMB, TROPONINI, MYOGLOBIN in the last 168 hours.  Invalid input(s): CK ------------------------------------------------------------------------------------------------------------------ No results found for: BNP   ---------------------------------------------------------------------------------------------------------------  Urinalysis    Component Value Date/Time   COLORURINE YELLOW 07/23/2015 0610   APPEARANCEUR CLEAR 07/23/2015 0610   LABSPEC 1.010 07/23/2015 0610   PHURINE 6.0 07/23/2015 0610   GLUCOSEU >1000* 07/23/2015 0610   HGBUR NEGATIVE 07/23/2015 0610   BILIRUBINUR NEGATIVE 07/23/2015 0610   KETONESUR NEGATIVE 07/23/2015 0610   PROTEINUR 30* 07/23/2015 0610   NITRITE NEGATIVE 07/23/2015 0610   LEUKOCYTESUR NEGATIVE 07/23/2015 0610    ----------------------------------------------------------------------------------------------------------------   Imaging Results:    Ct Abdomen Pelvis W Contrast  07/23/2015  CLINICAL DATA:  Upper abdominal pain beginning around 2300 hours. Seen here for similar pain a couple of weeks ago and told it was heartburn. Diffuse abdominal and epigastric region pain and right upper quadrant pain. EXAM: CT ABDOMEN AND PELVIS WITH CONTRAST TECHNIQUE: Multidetector CT imaging of the abdomen and pelvis was performed using the standard protocol following bolus administration of intravenous contrast. CONTRAST:  15m ISOVUE-300 IOPAMIDOL (ISOVUE-300) INJECTION 61% COMPARISON:  05/23/2010 FINDINGS: Atelectasis in the lung bases. Cholelithiasis with small stone in the gallbladder. There is evidence of pericholecystic edema.  Changes suggest cholecystitis. Small focal calcification in the head of the pancreas which was not present previously. This may represent a common duct stone. No bile duct dilatation. The liver, spleen, pancreas, adrenal glands, kidneys, inferior vena cava, and retroperitoneal lymph nodes are unremarkable. Calcification throughout the abdominal aorta and branch vessels with bilateral iliac artery stents. Diffuse calcification in the external iliac and common femoral arteries may represent areas of significant stenosis. Stomach, small bowel, and colon are not abnormally distended. No free air or free fluid in the abdomen. Pelvis: Appendix is normal. Bladder wall is not thickened. Uterus and ovaries are not enlarged. No free or loculated pelvic fluid collections. No pelvic mass or lymphadenopathy. Degenerative changes in the spine. IMPRESSION: Cholelithiasis with gallbladder wall thickening and edema possibly representing acute cholecystitis. Calcification in the region of the head of the pancreas may represent a common duct stone although there is no significant bile duct dilatation. Extensive vascular calcifications. No evidence bowel obstruction or inflammation. Electronically Signed   By: WLucienne CapersM.D.   On: 07/23/2015 06:20   [place]dom[place] Limited Ruq  07/23/2015  CLINICAL DATA:  Changes suggestive of cholecystitis on recent CT examination. EXAM: [place]DOM[place] LIMITED - RIGHT UPPER QUADRANT COMPARISON:  None. FINDINGS: Gallbladder: Gallbladder wall thickening to 9 mm is noted. Mild pericholecystic fluid is noted. Few small gallstones are seen. Common bile duct: Diameter: 5 mm. Liver: No focal lesion identified. Within normal limits in parenchymal echogenicity. IMPRESSION: Gallbladder wall thickening and pericholecystic fluid with evidence of cholelithiasis. These changes are consistent with acute cholecystitis. Electronically Signed   By: MInez CatalinaM.D.   On: 07/23/2015 08:00    My personal review of  EKG: Pending   Assessment & Plan:    Active Problems:   Diabetes ([place]   Coronary atherosclerosis of native coronary artery   Peripheral arterial disease (HCC)   Essential hypertension, benign   Hypothyroidism   Hypokalemia   Acute cholecystitis   Acute cholecystitis: -  Patient presents with complaints of abdominal pain, nausea and vomiting, CT abdomen pelvis with evidence of acute cholecystitis, upper quadrant ultrasound showing the same, no common bile duct stones, diabetes 5 mm. - Continue nothing by mouth, continue with IV fluids, continue with IV Zosyn, surgery consulted - Lipase within normal limits  Coronary artery disease - Denies any chest pain or shortness of breath, continue to hold aspirin and Plavix in anticipation of surgery - We'll check EKG  Peripheral vascular disease - hold aspirin and Plavix in anticipation of surgery  Diabetes mellitus - We'll start an insulin sliding scale, will resume Lantus half home dose as patient is nothing by mouth  Hyperlipidemia - Resume statin after surgery  Hypertension - Continue to hold hydrochlorothiazide, resume after surgery  Hypothyroidism - Continue with Synthroid  Hypokalemia - Repleted   DVT Prophylaxis Heparin -   AM Labs Ordered, also please review Full Orders  Family Communication: Admission, patients condition and plan of care including tests being ordered have been discussed with the patient who indicate understanding and agree with the plan and Code Status.  Code Status Full  Likely DC to Home  Condition GUARDED   Consults called:Surgery  Admission status: inpatient  Time spent in minutes : 60 minutes   Reighan Hipolito M.D on 07/23/2015 at 9:20 AM  Between 7am to 7pm - Pager - [phone]. After 7pm go to [email or web address] - password [email or web address]  [facility] - Office  [phone]

## 2015-07-24 DIAGNOSIS — E876 Hypokalemia: Secondary | ICD-10-CM

## 2015-07-24 DIAGNOSIS — I739 Peripheral vascular disease, unspecified: Secondary | ICD-10-CM

## 2015-07-24 DIAGNOSIS — E039 Hypothyroidism, unspecified: Secondary | ICD-10-CM

## 2015-07-24 LAB — CBC
HEMATOCRIT: 32.4 % — AB (ref 36.0–46.0)
HEMOGLOBIN: 10.6 g/dL — AB (ref 12.0–15.0)
MCH: 28.1 pg (ref 26.0–34.0)
MCHC: 32.7 g/dL (ref 30.0–36.0)
MCV: 85.9 fL (ref 78.0–100.0)
Platelets: 304 10*3/uL (ref 150–400)
RBC: 3.77 MIL/uL — ABNORMAL LOW (ref 3.87–5.11)
RDW: 15.2 % (ref 11.5–15.5)
WBC: 6.6 10*3/uL (ref 4.0–10.5)

## 2015-07-24 LAB — COMPREHENSIVE METABOLIC PANEL
ALBUMIN: 3.3 g/dL — AB (ref 3.5–5.0)
ALT: 8 U/L — ABNORMAL LOW (ref 14–54)
ANION GAP: 4 — AB (ref 5–15)
AST: 11 U/L — AB (ref 15–41)
Alkaline Phosphatase: 120 U/L (ref 38–126)
BILIRUBIN TOTAL: 0.2 mg/dL — AB (ref 0.3–1.2)
BUN: 10 mg/dL (ref 6–20)
CHLORIDE: 107 mmol/L (ref 101–111)
CO2: 25 mmol/L (ref 22–32)
Calcium: 9 mg/dL (ref 8.9–10.3)
Creatinine, Ser: 1.03 mg/dL — ABNORMAL HIGH (ref 0.44–1.00)
GFR calc Af Amer: >60 mL/min (ref >60)
GFR calc non Af Amer: 57 mL/min — ABNORMAL LOW (ref >60)
GLUCOSE: 129 mg/dL — AB (ref 65–99)
POTASSIUM: 4.9 mmol/L (ref 3.5–5.1)
SODIUM: 136 mmol/L (ref 135–145)
TOTAL PROTEIN: 6.8 g/dL (ref 6.5–8.1)

## 2015-07-24 LAB — GLUCOSE, CAPILLARY
GLUCOSE-CAPILLARY: 246 mg/dL — AB (ref 65–99)
Glucose-Capillary: 164 mg/dL — ABNORMAL HIGH (ref 65–99)

## 2015-07-24 MED ORDER — CIPROFLOXACIN HCL 500 MG PO TABS: 500.0000 mg | ORAL_TABLET | Freq: Two times a day (BID) | ORAL | Status: DC

## 2015-07-24 MED ORDER — SACCHAROMYCES BOULARDII 250 MG PO CAPS: 250.0000 mg | ORAL_CAPSULE | Freq: Two times a day (BID) | ORAL | Status: DC

## 2015-07-24 MED ORDER — METFORMIN HCL 500 MG PO TABS: ORAL_TABLET | ORAL | Status: DC

## 2015-07-24 MED ORDER — OXYCODONE-ACETAMINOPHEN 5-325 MG PO TABS: 1.0000 | ORAL_TABLET | Freq: Four times a day (QID) | ORAL | Status: DC | PRN

## 2015-07-24 MED ORDER — METRONIDAZOLE 500 MG PO TABS: 500.0000 mg | ORAL_TABLET | Freq: Three times a day (TID) | ORAL | Status: DC

## 2015-07-24 NOTE — Progress Notes (Signed)
Discharged PT per MD order and protocol. Discharge handouts reviewed/explained. Education completed. Prescriptions given and explained.   Pt verbalized understanding and left with all belongings. VSS. IV catheter D/C.  Patient wheeled down by staff member.  

## 2015-07-24 NOTE — Progress Notes (Signed)
Subjective: Patient denies any right upper quadrant abdominal pain or nausea. Tolerating regular diet well.  Objective: Vital signs in last 24 hours: Temp:  [97.3 F (36.3 C)-99.7 F (37.6 C)] 97.3 F (36.3 C) (06/17 0500) Pulse Rate:  [80] 80 (06/17 0500) Resp:  [16] 16 (06/17 0500) BP: (114)/(64) 114/64 mmHg (06/17 0500) SpO2:  [95 %] 95 % (06/17 0500) Last BM Date: 07/22/15  Intake/Output from previous day: 06/16 0701 - 06/17 0700 In: 2580 [P.O.:480; I.V.:1950; IV Piggyback:150] Out: -  Intake/Output this shift:    General appearance: alert, cooperative and no distress GI: soft, non-tender; bowel sounds normal; no masses,  no organomegaly  Lab Results:   Recent Labs  07/23/15 0430 07/24/15 0629  WBC 10.8* 6.6  HGB 12.0 10.6*  HCT 36.1 32.4*  PLT 326 304   BMET  Recent Labs  07/23/15 0430 07/24/15 0629  NA 134* 136  K 3.1* 4.9  CL 101 107  CO2 24 25  GLUCOSE 133* 129*  BUN 13 10  CREATININE 0.87 1.03*  CALCIUM 9.2 9.0   PT/INR No results for input(s): LABPROT, INR in the last 72 hours.  Studies/Results: Ct Abdomen Pelvis W Contrast  07/23/2015  CLINICAL DATA:  Upper abdominal pain beginning around 2300 hours. Seen here for similar pain a couple of weeks ago and told it was heartburn. Diffuse abdominal and epigastric region pain and right upper quadrant pain. EXAM: CT ABDOMEN AND PELVIS WITH CONTRAST TECHNIQUE: Multidetector CT imaging of the abdomen and pelvis was performed using the standard protocol following bolus administration of intravenous contrast. CONTRAST:  166mL ISOVUE-300 IOPAMIDOL (ISOVUE-300) INJECTION 61% COMPARISON:  05/23/2010 FINDINGS: Atelectasis in the lung bases. Cholelithiasis with small stone in the gallbladder. There is evidence of pericholecystic edema. Changes suggest cholecystitis. Small focal calcification in the head of the pancreas which was not present previously. This may represent a common duct stone. No bile duct  dilatation. The liver, spleen, pancreas, adrenal glands, kidneys, inferior vena cava, and retroperitoneal lymph nodes are unremarkable. Calcification throughout the abdominal aorta and branch vessels with bilateral iliac artery stents. Diffuse calcification in the external iliac and common femoral arteries may represent areas of significant stenosis. Stomach, small bowel, and colon are not abnormally distended. No free air or free fluid in the abdomen. Pelvis: Appendix is normal. Bladder wall is not thickened. Uterus and ovaries are not enlarged. No free or loculated pelvic fluid collections. No pelvic mass or lymphadenopathy. Degenerative changes in the spine. IMPRESSION: Cholelithiasis with gallbladder wall thickening and edema possibly representing acute cholecystitis. Calcification in the region of the head of the pancreas may represent a common duct stone although there is no significant bile duct dilatation. Extensive vascular calcifications. No evidence bowel obstruction or inflammation. Electronically Signed   By: Lucienne Capers M.D.   On: 07/23/2015 06:20   US Abdomen Limited Ruq  07/23/2015  CLINICAL DATA:  Changes suggestive of cholecystitis on recent CT examination. EXAM: US ABDOMEN LIMITED - RIGHT UPPER QUADRANT COMPARISON:  None. FINDINGS: Gallbladder: Gallbladder wall thickening to 9 mm is noted. Mild pericholecystic fluid is noted. Few small gallstones are seen. Common bile duct: Diameter: 5 mm. Liver: No focal lesion identified. Within normal limits in parenchymal echogenicity. IMPRESSION: Gallbladder wall thickening and pericholecystic fluid with evidence of cholelithiasis. These changes are consistent with acute cholecystitis. Electronically Signed   By: Inez Catalina M.D.   On: 07/23/2015 08:00    Anti-infectives: Anti-infectives    Start     Dose/Rate Route Frequency  Ordered Stop   07/23/15 1400  piperacillin-tazobactam (ZOSYN) IVPB 3.375 g     3.375 g 12.5 mL/hr over 240 Minutes  Intravenous Every 8 hours 07/23/15 0828     07/23/15 0645  piperacillin-tazobactam (ZOSYN) IVPB 3.375 g     3.375 g 12.5 mL/hr over 240 Minutes Intravenous  Once 07/23/15 0631 07/23/15 0728      Assessment/Plan: Impression: Biliary colic, cholelithiasis. Currently asymptomatic Plan: Okay for discharge from surgery standpoint. She has stopped her Plavix. Will undergo outpatient elective laparoscopic cholecystectomy on 07/30/2015.  LOS: 1 day    Rachel [name] A 07/24/2015

## 2015-07-24 NOTE — Discharge Instructions (Signed)
Follow with Primary MD Rachel Hillier, MD in 7 days   Get CBC, CMP,checked  by Primary MD next visit.    Activity: As tolerated with Full fall precautions use walker/cane & assistance as needed   Disposition Home    Diet: Heart Healthy , carbohydrate modified, low-fat , with feeding assistance and aspiration precautions.  For Heart failure patients - Check your Weight same time everyday, if you gain over 2 pounds, or you develop in leg swelling, experience more shortness of breath or chest pain, call your Primary MD immediately. Follow Cardiac Low Salt Diet and 1.5 lit/day fluid restriction.   On your next visit with your primary care physician please Get Medicines reviewed and adjusted.   Please request your Prim.MD to go over all Hospital Tests and Procedure/Radiological results at the follow up, please get all Hospital records sent to your Prim MD by signing hospital release before you go home.   If you experience worsening of your admission symptoms, develop shortness of breath, life threatening emergency, suicidal or homicidal thoughts you must seek medical attention immediately by calling 911 or calling your MD immediately  if symptoms less severe.  You Must read complete instructions/literature along with all the possible adverse reactions/side effects for all the Medicines you take and that have been prescribed to you. Take any new Medicines after you have completely understood and accpet all the possible adverse reactions/side effects.   Do not drive, operating heavy machinery, perform activities at heights, swimming or participation in water activities or provide baby sitting services if your were admitted for syncope or siezures until you have seen by Primary MD or a Neurologist and advised to do so again.  Do not drive when taking Pain medications.    Do not take more than prescribed Pain, Sleep and Anxiety Medications  Special Instructions: If you have smoked or chewed  Tobacco  in the last 2 yrs please stop smoking, stop any regular Alcohol  and or any Recreational drug use.  Wear Seat belts while driving.   Please note  You were cared for by a hospitalist during your hospital stay. If you have any questions about your discharge medications or the care you received while you were in the hospital after you are discharged, you can call the unit and asked to speak with the hospitalist on call if the hospitalist that took care of you is not available. Once you are discharged, your primary care physician will handle any further medical issues. Please note that NO REFILLS for any discharge medications will be authorized once you are discharged, as it is imperative that you return to your primary care physician (or establish a relationship with a primary care physician if you do not have one) for your aftercare needs so that they can reassess your need for medications and monitor your lab values.

## 2015-07-24 NOTE — Discharge Summary (Signed)
Rachel [name], is a 65 y.o. female  DOB 25-Feb-1950  MRN [id number].  Admission date:  07/23/2015  Admitting Physician  Vianne Bulls, MD  Discharge Date:  07/24/2015   Primary MD  [name], MD  Recommendations for primary care physician for things to follow:  - Please check CBC, CMP during next visit. - Will undergo outpatient elective laparoscopic cholecystectomy by Dr. Arnoldo Morale on 07/30/2015.   Admission Diagnosis  Acute cholecystitis [K81.0]   Discharge Diagnosis  Acute cholecystitis [K81.0]    Active Problems:   Diabetes ([place])   Coronary atherosclerosis of native coronary artery   Peripheral arterial disease ([place])   Essential hypertension, benign   Hypothyroidism   Hypokalemia   Acute cholecystitis      Past Medical History  Diagnosis Date  . Diabetes type 2, controlled ([place]) 2000  . Hyperlipidemia   . Essential hypertension, benign   . GERD (gastroesophageal reflux disease)   . Fatty liver   . Tubular adenoma of colon 07/2007    Multiple colonic polyps  . Coronary atherosclerosis of native coronary artery     DES x 2 (overlapping) RCA 03/2008 - patent 2011  . Odynophagia     Severe esophagitis on CT 05/2010, treated empirically with Diflucan  . Microalbuminuria   . Venous stasis   . Obesity   . PAD (peripheral artery disease) ([facility])     history of left greater than right lower 70 claudication    Past Surgical History  Procedure Laterality Date  . Cystectomy      Pilonidal  . Colonoscopy w/ polypectomy  07/2007    Tubular adenoma  . Esophagogastroduodenoscopy  04/2010    RMR: Normal esophagus , Stomach D1 and D2   . Coronary stent placement    . Peripheral vascular catheterization Bilateral 09/28/2014    Procedure: Lower Extremity Angiography;  Surgeon: [name], MD;  Location: [facility];  Service: Cardiovascular;  Laterality: Bilateral;  . Peripheral  vascular catheterization N/A 09/28/2014    Procedure: Abdominal Aortogram;  Surgeon: [name], MD;  Location: [facility];  Service: Cardiovascular;  Laterality: N/A;  . Peripheral vascular catheterization N/A 10/15/2014    Procedure: Lower Extremity Angiography;  Surgeon: [name], MD;  Location: [facility];  Service: Cardiovascular;  Laterality: N/A;  . Peripheral vascular catheterization  10/15/2014    Procedure: Peripheral Vascular Intervention;  Surgeon: [name], MD;  Location: [facility];  Service: Cardiovascular;;  bilateral iliac stents  . Tubal ligation Bilateral   . Back surgery         History of present illness and  Hospital Course:     Kindly see H&P for history of present illness and admission details, please review complete Labs, Consult reports and Test reports for all details in brief  HPI  from the history and physical done on the day of admission 07/23/2015 Rachel [name] is a 65 y.o. female, female, with history of hypertension, hyperlipidemia, diabetes, CAD  who presents emergency department with epigastric, right upper quadrant abdominal pain, patient reports pain is intermittent, over the last 2 weeks, initially thought that he it's due to viral illness, started on PPI by PCP as it was thought to be secondary to reflux, but pain was significant yesterday evening, came to ED, described it as sharp, stabbing, company by nausea and vomiting, denies diarrhea, coffee-ground emesis, melena or bright red blood per rectum - In ED CT head was significant for acute cholecystitis, right upper quadrant confirmed the same, patient underwent to the Center hospitalist service for need of intervention by surgery. Lipase within normal limits, LFTs within normal limits but only mildly elevated alkaline phosphatase at [facility] cholecystitis/cholelithiasis: - Patient presents with complaints of abdominal pain, nausea and  vomiting, CT abdomen pelvis with evidence of acute cholecystitis, upper quadrant ultrasound showing the same, no common bile duct stones, diabetes 5 mm. - Agent has been seen by surgery, given she is on Plavix, no surgery anticipated during hospital stay, she was started on soft diet yesterday, she tolerated fairly well, plan to continue hold her Plavix on discharge, and to undergo outpatient elective laparoscopic cholecystectomy on 07/30/2015. - LFTs remained stable, will discharge on Cipro and Flagyl 1 week, and probiotics.  Coronary artery disease - Denies any chest pain or shortness of breath, during hospital stay, continue home medication, but Plavix will be on hold pending her surgery.  Peripheral vascular disease - Continue with aspirin, Plavix will be on hold tonight's patient for surgery  Diabetes mellitus - Resume home medication  Hyperlipidemia - Resume home medication  Hypertension - Resume home medication  Hypothyroidism - 0 home medication  Hypokalemia - Repleted    Discharge Condition:  stable   Follow UP  Follow-up Information    Follow up with [name], MD. Schedule an appointment as soon as possible for a visit in 1 week.   Specialty:  Family Medicine   Contact information:   [address] [place] [address] [phone]       Follow up with Jamesetta So, MD.   Specialty:  General Surgery   Contact information:   [id number] [address] [phone]         Discharge Instructions  and  Discharge Medications    Discharge Instructions    Discharge instructions    Complete by:  As directed   Follow with Primary MD [name], MD in 7 days   Get CBC, CMP,checked  by Primary MD next visit.    Activity: As tolerated with Full fall precautions use walker/cane & assistance as needed   Disposition Home    Diet: Heart Healthy , carbohydrate modified, low-fat , with feeding assistance and aspiration  precautions.  For Heart failure patients - Check your Weight same time everyday, if you gain over 2 pounds, or you develop in leg swelling, experience more shortness of breath or chest pain, call your Primary MD immediately. Follow Cardiac Low Salt Diet and 1.5 lit/day fluid restriction.   On your next visit with your primary care physician please Get Medicines reviewed and adjusted.   Please request your Prim.MD to go over all Hospital Tests and Procedure/Radiological results at the follow up, please get all Hospital records sent to your Prim MD by signing hospital release before you go home.   If you experience worsening of your admission symptoms, develop shortness of breath, life threatening emergency, suicidal or homicidal thoughts you  must seek medical attention immediately by calling 911 or calling your MD immediately  if symptoms less severe.  You Must read complete instructions/literature along with all the possible adverse reactions/side effects for all the Medicines you take and that have been prescribed to you. Take any new Medicines after you have completely understood and accpet all the possible adverse reactions/side effects.   Do not drive, operating heavy machinery, perform activities at heights, swimming or participation in water activities or provide baby sitting services if your were admitted for syncope or siezures until you have seen by Primary MD or a Neurologist and advised to do so again.  Do not drive when taking Pain medications.    Do not take more than prescribed Pain, Sleep and Anxiety Medications  Special Instructions: If you have smoked or chewed Tobacco  in the last 2 yrs please stop smoking, stop any regular Alcohol  and or any Recreational drug use.  Wear Seat belts while driving.   Please note  You were cared for by a hospitalist during your hospital stay. If you have any questions about your discharge medications or the care you received while you  were in the hospital after you are discharged, you can call the unit and asked to speak with the hospitalist on call if the hospitalist that took care of you is not available. Once you are discharged, your primary care physician will handle any further medical issues. Please note that NO REFILLS for any discharge medications will be authorized once you are discharged, as it is imperative that you return to your primary care physician (or establish a relationship with a primary care physician if you do not have one) for your aftercare needs so that they can reassess your need for medications and monitor your lab values.     Increase activity slowly    Complete by:  As directed             Medication List    STOP taking these medications        cefdinir 300 MG capsule  Commonly known as:  OMNICEF     clopidogrel 75 MG tablet  Commonly known as:  PLAVIX     fluconazole 150 MG tablet  Commonly known as:  DIFLUCAN      TAKE these medications        albuterol 108 (90 Base) MCG/ACT inhaler  Commonly known as:  PROVENTIL HFA;VENTOLIN HFA  2 puffs every 4 -6 hours prn wheezing     aspirin EC 81 MG tablet  Take 81 mg by mouth daily.     Blood Glucose Monitoring Suppl w/Device Kit  Please dispense Glucose monitor per patient's preference or insurance with testing supplies. Test twice daily. Dx E11.9     buPROPion 150 MG 12 hr tablet  Commonly known as:  WELLBUTRIN SR  Take 1 tablet (150 mg total) by mouth daily.     canagliflozin 100 MG Tabs tablet  Commonly known as:  INVOKANA  Take 1 tablet by mouth  daily     ciprofloxacin 500 MG tablet  Commonly known as:  CIPRO  Take 1 tablet (500 mg total) by mouth 2 (two) times daily.     hydrochlorothiazide 12.5 MG capsule  Commonly known as:  MICROZIDE  Take 1 capsule by mouth  daily     insulin glargine 100 UNIT/ML injection  Commonly known as:  LANTUS  Inject 64 Units into the skin at bedtime.     insulin regular  100 units/mL  injection  Commonly known as:  HUMULIN R  Use TID per sliding scale up to 15 units at a time     levothyroxine 75 MCG tablet  Commonly known as:  SYNTHROID, LEVOTHROID  Take 1 tablet by mouth  daily before breakfast     meclizine 25 MG tablet  Commonly known as:  ANTIVERT  TAKE 1 TABLET (25 MG TOTAL) BY MOUTH EVERY 6 (SIX) HOURS AS NEEDED FOR DIZZINESS OR NAUSEA.     metFORMIN 500 MG tablet  Commonly known as:  GLUCOPHAGE  Please resume in 3 days, on 07/27/2015,  Start taking on:  07/27/2015     metroNIDAZOLE 500 MG tablet  Commonly known as:  FLAGYL  Take 1 tablet (500 mg total) by mouth 3 (three) times daily.     ondansetron 8 MG tablet  Commonly known as:  ZOFRAN  Take 1 tablet (8 mg total) by mouth every 8 (eight) hours as needed for nausea or vomiting.     oxybutynin 10 MG 24 hr tablet  Commonly known as:  DITROPAN-XL  Take 2 tablets by mouth  every day     oxyCODONE-acetaminophen 5-325 MG tablet  Commonly known as:  PERCOCET  Take 1 tablet by mouth every 6 (six) hours as needed for severe pain.     pantoprazole 40 MG tablet  Commonly known as:  PROTONIX  Take 1 tablet (40 mg total) by mouth daily.     potassium chloride SA 20 MEQ tablet  Commonly known as:  K-DUR,KLOR-CON  Take 1 tablet (20 mEq total) by mouth 2 (two) times daily.     rosuvastatin 20 MG tablet  Commonly known as:  CRESTOR  Take 1 tablet by mouth  every night at bedtime     saccharomyces boulardii 250 MG capsule  Commonly known as:  FLORASTOR  Take 1 capsule (250 mg total) by mouth 2 (two) times daily.          Diet and Activity recommendation: See Discharge Instructions above   Consults obtained -  Gen. surgery Dr. Arnoldo Morale   Major procedures and Radiology Reports - PLEASE review detailed and final reports for all details, in brief -     Ct Abdomen Pelvis W Contrast  07/23/2015  CLINICAL DATA:  Upper abdominal pain beginning around 2300 hours. Seen here for similar pain a couple of  weeks ago and told it was heartburn. Diffuse abdominal and epigastric region pain and right upper quadrant pain. EXAM: CT ABDOMEN AND PELVIS WITH CONTRAST TECHNIQUE: Multidetector CT imaging of the abdomen and pelvis was performed using the standard protocol following bolus administration of intravenous contrast. CONTRAST:  136m ISOVUE-300 IOPAMIDOL (ISOVUE-300) INJECTION 61% COMPARISON:  05/23/2010 FINDINGS: Atelectasis in the lung bases. Cholelithiasis with small stone in the gallbladder. There is evidence of pericholecystic edema. Changes suggest cholecystitis. Small focal calcification in the head of the pancreas which was not present previously. This may represent a common duct stone. No bile duct dilatation. The liver, spleen, pancreas, adrenal glands, kidneys, inferior vena cava, and retroperitoneal lymph nodes are unremarkable. Calcification throughout the abdominal aorta and branch vessels with bilateral iliac artery stents. Diffuse calcification in the external iliac and common femoral arteries may represent areas of significant stenosis. Stomach, small bowel, and colon are not abnormally distended. No free air or free fluid in the abdomen. Pelvis: Appendix is normal. Bladder wall is not thickened. Uterus and ovaries are not enlarged. No free or loculated pelvic fluid collections. No pelvic mass  or lymphadenopathy. Degenerative changes in the spine. IMPRESSION: Cholelithiasis with gallbladder wall thickening and edema possibly representing acute cholecystitis. Calcification in the region of the head of the pancreas may represent a common duct stone although there is no significant bile duct dilatation. Extensive vascular calcifications. No evidence bowel obstruction or inflammation. Electronically Signed   By: Lucienne Capers M.D.   On: 07/23/2015 06:20   US Abdomen Limited Ruq  07/23/2015  CLINICAL DATA:  Changes suggestive of cholecystitis on recent CT examination. EXAM: US ABDOMEN LIMITED - RIGHT  UPPER QUADRANT COMPARISON:  None. FINDINGS: Gallbladder: Gallbladder wall thickening to 9 mm is noted. Mild pericholecystic fluid is noted. Few small gallstones are seen. Common bile duct: Diameter: 5 mm. Liver: No focal lesion identified. Within normal limits in parenchymal echogenicity. IMPRESSION: Gallbladder wall thickening and pericholecystic fluid with evidence of cholelithiasis. These changes are consistent with acute cholecystitis. Electronically Signed   By: Inez Catalina M.D.   On: 07/23/2015 08:00    Micro Results    No results found for this or any previous visit (from the past 240 hour(s)).     Today   Subjective:   Tarah Buboltz today has no headache,no chest Or abdominal pain, tolerating oral intake, feels much better wants to go home today. *  Objective:   Blood pressure 114/64, pulse 80, temperature 97.3 F (36.3 C), temperature source Oral, resp. rate 16, height _0  (1.6 m), weight 66.679 kg (147 lb), SpO2 95 %.   Intake/Output Summary (Last 24 hours) at 07/24/15 1210 Last data filed at 07/24/15 0800  Gross per 24 hour  Intake   1770 ml  Output      0 ml  Net   1770 ml    Exam Awake Alert, Oriented x 3, No new F.N deficits, Normal affect [place].AT,PERRAL Supple Neck,No JVD, No cervical lymphadenopathy appriciated.  Symmetrical Chest wall movement, Good air movement bilaterally, CTAB RRR,No Gallops,Rubs or new Murmurs, No Parasternal Heave +ve B.Sounds, Abd Soft, Non tender, No organomegaly appriciated, No rebound -guarding or rigidity. No Cyanosis, Clubbing or edema, No new Rash or bruise  Data Review   CBC w Diff: Lab Results  Component Value Date   WBC 6.6 07/24/2015   HGB 10.6* 07/24/2015   HCT 32.4* 07/24/2015   PLT 304 07/24/2015   LYMPHOPCT 15 07/23/2015   MONOPCT 4 07/23/2015   EOSPCT 1 07/23/2015   BASOPCT 0 07/23/2015    CMP: Lab Results  Component Value Date   NA 136 07/24/2015   NA 139 04/17/2015   K 4.9 07/24/2015   CL 107  07/24/2015   CO2 25 07/24/2015   BUN 10 07/24/2015   BUN 9 04/17/2015   CREATININE 1.03* 07/24/2015   CREATININE 0.88 10/07/2014   PROT 6.8 07/24/2015   PROT 7.0 04/17/2015   ALBUMIN 3.3* 07/24/2015   ALBUMIN 4.0 04/17/2015   BILITOT 0.2* 07/24/2015   BILITOT <0.2 04/17/2015   ALKPHOS 120 07/24/2015   AST 11* 07/24/2015   ALT 8* 07/24/2015  .   Total Time in preparing paper work, data evaluation and todays exam - 35 minutes  Albaro Deviney M.D on 07/24/2015 at 12:10 PM  [facility]   Office  [phone]

## 2015-07-26 NOTE — H&P (Addendum)
Rachel [name] is an 65 y.o. female.   Chief Complaint: Biliary colic, cholelithiasis HPI: Patient is a 65 year old black female who recently was in the hospital with right upper quadrant abdominal pain secondary to cholelithiasis. She did have a history of biliary colic. Once she was brought into the hospital, her symptoms of right upper quadrant abdominal pain, nausea, and vomiting quickly resolved. Her liver enzyme tests were within normal limits. Ultrasound showed cholelithiasis with a thickened gallbladder wall and mild pericholecystic fluid. Common bile duct is within normal limits. Her surgery had to be delayed due to her Plavix use. She now presents for laparoscopic cholecystectomy. She has been off her Plavix for 1 week.  Past Medical History  Diagnosis Date  . Diabetes type 2, controlled ([place]) 2000  . Hyperlipidemia   . Essential hypertension, benign   . GERD (gastroesophageal reflux disease)   . Fatty liver   . Tubular adenoma of colon 07/2007    Multiple colonic polyps  . Coronary atherosclerosis of native coronary artery     DES x 2 (overlapping) RCA 03/2008 - patent 2011  . Odynophagia     Severe esophagitis on CT 05/2010, treated empirically with Diflucan  . Microalbuminuria   . Venous stasis   . Obesity   . PAD (peripheral artery disease) (H[facility])     history of left greater than right lower 70 claudication    Past Surgical History  Procedure Laterality Date  . Cystectomy      Pilonidal  . Colonoscopy w/ polypectomy  07/2007    Tubular adenoma  . Esophagogastroduodenoscopy  04/2010    RMR: Normal esophagus , Stomach D1 and D2   . Coronary stent placement    . Peripheral vascular catheterization Bilateral 09/28/2014    Procedure: Lower Extremity Angiography;  Surgeon: [name], MD;  Location: [facility];  Service: Cardiovascular;  Laterality: Bilateral;  . Peripheral vascular catheterization N/A 09/28/2014    Procedure: Abdominal Aortogram;  Surgeon:  [name], MD;  Location: [facility];  Service: Cardiovascular;  Laterality: N/A;  . Peripheral vascular catheterization N/A 10/15/2014    Procedure: Lower Extremity Angiography;  Surgeon: [name], MD;  Location: [facility];  Service: Cardiovascular;  Laterality: N/A;  . Peripheral vascular catheterization  10/15/2014    Procedure: Peripheral Vascular Intervention;  Surgeon: [name], MD;  Location: [facility];  Service: Cardiovascular;;  bilateral iliac stents  . Tubal ligation Bilateral   . Back surgery      Family History  Problem Relation Age of Onset  . Bone cancer Father   . Heart disease Maternal Grandmother   . Colon cancer Neg Hx   . Liver disease Neg Hx   . Inflammatory bowel disease Neg Hx   . Hypertension Mother    Social History:  reports that she has been smoking Cigarettes.  She has a 47 pack-year smoking history. She has never used smokeless tobacco. She reports that she does not drink alcohol or use illicit drugs.  Allergies:  Allergies  Allergen Reactions  . Altace [Ramipril] Cough  . Biaxin [Clarithromycin]     Fatigue  . Niacin And Related Other (See Comments)    Flush - felt like on fire.  . Nsaids Other (See Comments)    feverish    No prescriptions prior to admission    Results for orders placed or performed during the hospital encounter of 07/23/15 (from the past 48 hour(s))  Glucose, capillary     Status: Abnormal   Collection Time: 07/24/15 12:06 PM  Result Value Ref Range   Glucose-Capillary 246 (H) 65 - 99 mg/dL   Comment 1 Notify RN    Comment 2 Document in Chart    No results found.  Review of Systems  Constitutional: Negative.   HENT: Negative.   Eyes: Negative.   Cardiovascular: Negative.   Gastrointestinal: Positive for nausea and abdominal pain.  Genitourinary: Negative.   Skin: Negative.   Psychiatric/Behavioral: Negative.     There were no vitals taken for this visit. Physical Exam   Constitutional: She is oriented to person, place, and time. She appears well-developed and well-nourished.  HENT:  Head: Atraumatic.  Eyes: No scleral icterus.  Neck: Normal range of motion. Neck supple.  Cardiovascular: Normal rate, regular rhythm and normal heart sounds.   Respiratory: Effort normal and breath sounds normal.  GI: Soft. Bowel sounds are normal. She exhibits no distension. There is no tenderness. There is no rebound.  Neurological: She is alert and oriented to person, place, and time.  Skin: Skin is warm and dry.     Assessment/Plan Impression: Biliary colic, cholelithiasis Plan: Patient will undergo a laparoscopic cholecystectomy on 07/30/2015. The risks and benefits of the procedure including bleeding, infection, hepatobiliary injury, and the possibility of an open procedure were fully explained to the patient, who gave informed consent.  Jamesetta So, MD 07/26/2015, 10:13 AM

## 2015-07-28 ENCOUNTER — Encounter (HOSPITAL_COMMUNITY)
Admit: 2015-07-28 | Discharge: 2015-07-28 | Disposition: A | Discharge status: Home / Self Care | Payer: BLUE CROSS/BLUE SHIELD | Attending: General Surgery | Admitting: General Surgery

## 2015-07-28 ENCOUNTER — Encounter (HOSPITAL_COMMUNITY): Payer: Self-pay

## 2015-07-28 HISTORY — DX: Unspecified asthma, uncomplicated: J45.909

## 2015-07-28 NOTE — Pre-Procedure Instructions (Signed)
Patient given information to sign up for my chart at home. 

## 2015-07-30 ENCOUNTER — Ambulatory Visit (HOSPITAL_COMMUNITY)
Admission: RE | Admit: 2015-07-30 | Discharge: 2015-07-30 | Disposition: A | Discharge status: Home / Self Care | Payer: BLUE CROSS/BLUE SHIELD | Source: Ambulatory Visit | Attending: General Surgery | Admitting: General Surgery

## 2015-07-30 ENCOUNTER — Ambulatory Visit (HOSPITAL_COMMUNITY): Payer: BLUE CROSS/BLUE SHIELD | Admitting: Anesthesiology

## 2015-07-30 ENCOUNTER — Encounter (HOSPITAL_COMMUNITY)
Admission: RE | Disposition: A | Discharge status: Home / Self Care | Payer: Self-pay | Source: Ambulatory Visit | Attending: General Surgery

## 2015-07-30 ENCOUNTER — Encounter (HOSPITAL_COMMUNITY): Payer: Self-pay | Admitting: *Deleted

## 2015-07-30 DIAGNOSIS — F17210 Nicotine dependence, cigarettes, uncomplicated: Secondary | ICD-10-CM | POA: Insufficient documentation

## 2015-07-30 DIAGNOSIS — E785 Hyperlipidemia, unspecified: Secondary | ICD-10-CM | POA: Diagnosis not present

## 2015-07-30 DIAGNOSIS — E039 Hypothyroidism, unspecified: Secondary | ICD-10-CM | POA: Insufficient documentation

## 2015-07-30 DIAGNOSIS — K760 Fatty (change of) liver, not elsewhere classified: Secondary | ICD-10-CM | POA: Diagnosis not present

## 2015-07-30 DIAGNOSIS — Z86010 Personal history of colonic polyps: Secondary | ICD-10-CM | POA: Diagnosis not present

## 2015-07-30 DIAGNOSIS — K219 Gastro-esophageal reflux disease without esophagitis: Secondary | ICD-10-CM | POA: Insufficient documentation

## 2015-07-30 DIAGNOSIS — I10 Essential (primary) hypertension: Secondary | ICD-10-CM | POA: Diagnosis not present

## 2015-07-30 DIAGNOSIS — I739 Peripheral vascular disease, unspecified: Secondary | ICD-10-CM | POA: Diagnosis not present

## 2015-07-30 DIAGNOSIS — E119 Type 2 diabetes mellitus without complications: Secondary | ICD-10-CM | POA: Insufficient documentation

## 2015-07-30 DIAGNOSIS — Z8719 Personal history of other diseases of the digestive system: Secondary | ICD-10-CM | POA: Diagnosis not present

## 2015-07-30 DIAGNOSIS — Z955 Presence of coronary angioplasty implant and graft: Secondary | ICD-10-CM | POA: Diagnosis not present

## 2015-07-30 DIAGNOSIS — J45909 Unspecified asthma, uncomplicated: Secondary | ICD-10-CM | POA: Insufficient documentation

## 2015-07-30 DIAGNOSIS — E669 Obesity, unspecified: Secondary | ICD-10-CM | POA: Diagnosis not present

## 2015-07-30 DIAGNOSIS — K811 Chronic cholecystitis: Secondary | ICD-10-CM | POA: Insufficient documentation

## 2015-07-30 DIAGNOSIS — I2510 Atherosclerotic heart disease of native coronary artery without angina pectoris: Secondary | ICD-10-CM | POA: Diagnosis not present

## 2015-07-30 DIAGNOSIS — K8010 Calculus of gallbladder with chronic cholecystitis without obstruction: Secondary | ICD-10-CM | POA: Diagnosis present

## 2015-07-30 HISTORY — PX: CHOLECYSTECTOMY: SHX55

## 2015-07-30 LAB — GLUCOSE, CAPILLARY
Glucose-Capillary: 187 mg/dL — ABNORMAL HIGH (ref 65–99)
Glucose-Capillary: 267 mg/dL — ABNORMAL HIGH (ref 65–99)

## 2015-07-30 SURGERY — LAPAROSCOPIC CHOLECYSTECTOMY
Anesthesia: General | Site: Abdomen

## 2015-07-30 MED ORDER — ONDANSETRON HCL 4 MG/2ML IJ SOLN
INTRAMUSCULAR | Status: AC
Start: 2015-07-30 — End: 2015-07-30
  Filled 2015-07-30: qty 2

## 2015-07-30 MED ORDER — POVIDONE-IODINE 10 % EX OINT
TOPICAL_OINTMENT | CUTANEOUS | Status: AC
  Filled 2015-07-30: qty 1

## 2015-07-30 MED ORDER — HEMOSTATIC AGENTS (NO CHARGE) OPTIME
TOPICAL | Status: DC | PRN
  Administered 2015-07-30: 1 via TOPICAL

## 2015-07-30 MED ORDER — NEOSTIGMINE METHYLSULFATE 10 MG/10ML IV SOLN
INTRAVENOUS | Status: AC
  Filled 2015-07-30: qty 1

## 2015-07-30 MED ORDER — ROCURONIUM BROMIDE 50 MG/5ML IV SOLN
INTRAVENOUS | Status: AC
  Filled 2015-07-30: qty 1

## 2015-07-30 MED ORDER — ONDANSETRON HCL 4 MG/2ML IJ SOLN
4.0000 mg | Freq: Once | INTRAMUSCULAR | Status: AC
  Administered 2015-07-30: 4 mg via INTRAVENOUS

## 2015-07-30 MED ORDER — LIDOCAINE HCL (CARDIAC) 20 MG/ML IV SOLN
INTRAVENOUS | Status: DC | PRN
  Administered 2015-07-30: 50 mg via INTRAVENOUS

## 2015-07-30 MED ORDER — POVIDONE-IODINE 10 % OINT PACKET
TOPICAL_OINTMENT | CUTANEOUS | Status: DC | PRN
  Administered 2015-07-30: 1 via TOPICAL

## 2015-07-30 MED ORDER — PROPOFOL 10 MG/ML IV BOLUS
INTRAVENOUS | Status: DC | PRN
  Administered 2015-07-30: 140 mg via INTRAVENOUS

## 2015-07-30 MED ORDER — PROPOFOL 10 MG/ML IV BOLUS
INTRAVENOUS | Status: AC
  Filled 2015-07-30: qty 20

## 2015-07-30 MED ORDER — MIDAZOLAM HCL 2 MG/2ML IJ SOLN
INTRAMUSCULAR | Status: AC
  Filled 2015-07-30: qty 2

## 2015-07-30 MED ORDER — ARTIFICIAL TEARS OP OINT
TOPICAL_OINTMENT | OPHTHALMIC | Status: AC
  Filled 2015-07-30: qty 3.5

## 2015-07-30 MED ORDER — OXYCODONE-ACETAMINOPHEN 5-325 MG PO TABS: 1.0000 | ORAL_TABLET | ORAL | Status: DC | PRN

## 2015-07-30 MED ORDER — ROCURONIUM BROMIDE 100 MG/10ML IV SOLN
INTRAVENOUS | Status: DC | PRN
  Administered 2015-07-30: 30 mg via INTRAVENOUS

## 2015-07-30 MED ORDER — FENTANYL CITRATE (PF) 250 MCG/5ML IJ SOLN
INTRAMUSCULAR | Status: AC
  Filled 2015-07-30: qty 5

## 2015-07-30 MED ORDER — GLYCOPYRROLATE 0.2 MG/ML IJ SOLN
INTRAMUSCULAR | Status: DC | PRN
  Administered 2015-07-30: 0.6 mg via INTRAVENOUS

## 2015-07-30 MED ORDER — MIDAZOLAM HCL 2 MG/2ML IJ SOLN
1.0000 mg | INTRAMUSCULAR | Status: DC | PRN
Start: 2015-07-30 — End: 2015-07-30
  Administered 2015-07-30: 2 mg via INTRAVENOUS

## 2015-07-30 MED ORDER — FENTANYL CITRATE (PF) 100 MCG/2ML IJ SOLN: 25.0000 ug | INTRAMUSCULAR | Status: DC | PRN

## 2015-07-30 MED ORDER — CHLORHEXIDINE GLUCONATE CLOTH 2 % EX PADS: 6.0000 | MEDICATED_PAD | Freq: Once | CUTANEOUS | Status: DC

## 2015-07-30 MED ORDER — LIDOCAINE HCL (PF) 1 % IJ SOLN
INTRAMUSCULAR | Status: AC
  Filled 2015-07-30: qty 5

## 2015-07-30 MED ORDER — CIPROFLOXACIN IN D5W 400 MG/200ML IV SOLN
400.0000 mg | INTRAVENOUS | Status: AC
  Administered 2015-07-30: 400 mg via INTRAVENOUS
  Filled 2015-07-30: qty 200

## 2015-07-30 MED ORDER — SUCCINYLCHOLINE CHLORIDE 20 MG/ML IJ SOLN
INTRAMUSCULAR | Status: AC
  Filled 2015-07-30: qty 2

## 2015-07-30 MED ORDER — SODIUM CHLORIDE 0.9 % IR SOLN
Status: DC | PRN
  Administered 2015-07-30: 1000 mL

## 2015-07-30 MED ORDER — BUPIVACAINE HCL (PF) 0.5 % IJ SOLN
INTRAMUSCULAR | Status: DC | PRN
  Administered 2015-07-30: 10 mL

## 2015-07-30 MED ORDER — LACTATED RINGERS IV SOLN
INTRAVENOUS | Status: DC
  Administered 2015-07-30: 08:00:00 via INTRAVENOUS

## 2015-07-30 MED ORDER — LABETALOL HCL 5 MG/ML IV SOLN
INTRAVENOUS | Status: AC
  Filled 2015-07-30: qty 4

## 2015-07-30 MED ORDER — GLYCOPYRROLATE 0.2 MG/ML IJ SOLN
INTRAMUSCULAR | Status: AC
Start: 2015-07-30 — End: 2015-07-30
  Filled 2015-07-30: qty 3

## 2015-07-30 MED ORDER — PROMETHAZINE HCL 25 MG/ML IJ SOLN
6.2500 mg | Freq: Four times a day (QID) | INTRAMUSCULAR | Status: DC | PRN
Start: 2015-07-30 — End: 2015-07-30
  Administered 2015-07-30: 6.25 mg via INTRAVENOUS

## 2015-07-30 MED ORDER — PROMETHAZINE HCL 25 MG/ML IJ SOLN
INTRAMUSCULAR | Status: AC
  Filled 2015-07-30: qty 1

## 2015-07-30 MED ORDER — ONDANSETRON HCL 4 MG/2ML IJ SOLN
4.0000 mg | Freq: Once | INTRAMUSCULAR | Status: AC | PRN
  Administered 2015-07-30: 4 mg via INTRAVENOUS
  Filled 2015-07-30: qty 2

## 2015-07-30 MED ORDER — BUPIVACAINE HCL (PF) 0.5 % IJ SOLN
INTRAMUSCULAR | Status: AC
Start: 2015-07-30 — End: 2015-07-30
  Filled 2015-07-30: qty 30

## 2015-07-30 MED ORDER — NEOSTIGMINE METHYLSULFATE 10 MG/10ML IV SOLN
INTRAVENOUS | Status: DC | PRN
  Administered 2015-07-30: 4 mg via INTRAVENOUS

## 2015-07-30 MED ORDER — FENTANYL CITRATE (PF) 100 MCG/2ML IJ SOLN
INTRAMUSCULAR | Status: DC | PRN
  Administered 2015-07-30 (×5): 50 ug via INTRAVENOUS

## 2015-07-30 SURGICAL SUPPLY — 44 items
APPLIER CLIP LAPSCP 10X32 DD (CLIP) ×2 IMPLANT
BAG HAMPER (MISCELLANEOUS) ×2 IMPLANT
BAG SPEC RTRVL LRG 6X4 10 (ENDOMECHANICALS) ×1
CHLORAPREP W/TINT 26ML (MISCELLANEOUS) ×2 IMPLANT
CLOTH BEACON ORANGE TIMEOUT ST (SAFETY) ×2 IMPLANT
COVER LIGHT HANDLE STERIS (MISCELLANEOUS) ×4 IMPLANT
DECANTER SPIKE VIAL GLASS SM (MISCELLANEOUS) ×2 IMPLANT
ELECT REM PT RETURN 9FT ADLT (ELECTROSURGICAL) ×2
ELECTRODE REM PT RTRN 9FT ADLT (ELECTROSURGICAL) ×1 IMPLANT
FILTER SMOKE EVAC LAPAROSHD (FILTER) ×2 IMPLANT
FORMALIN 10 PREFIL 120ML (MISCELLANEOUS) ×2 IMPLANT
GLOVE BIOGEL PI IND STRL 7.0 (GLOVE) ×1 IMPLANT
GLOVE BIOGEL PI INDICATOR 7.0 (GLOVE) ×3
GLOVE ECLIPSE 6.5 STRL STRAW (GLOVE) ×2 IMPLANT
GLOVE ECLIPSE 7.0 STRL STRAW (GLOVE) ×1 IMPLANT
GLOVE EXAM NITRILE MD LF STRL (GLOVE) ×1 IMPLANT
GLOVE SURG SS PI 7.5 STRL IVOR (GLOVE) ×2 IMPLANT
GOWN STRL REUS W/ TWL XL LVL3 (GOWN DISPOSABLE) ×1 IMPLANT
GOWN STRL REUS W/TWL LRG LVL3 (GOWN DISPOSABLE) ×5 IMPLANT
GOWN STRL REUS W/TWL XL LVL3 (GOWN DISPOSABLE) ×2
HEMOSTAT SNOW SURGICEL 2X4 (HEMOSTASIS) ×2 IMPLANT
INST SET LAPROSCOPIC AP (KITS) ×2 IMPLANT
IV NS IRRIG 3000ML ARTHROMATIC (IV SOLUTION) IMPLANT
KIT ROOM TURNOVER APOR (KITS) ×2 IMPLANT
MANIFOLD NEPTUNE II (INSTRUMENTS) ×2 IMPLANT
NDL INSUFFLATION 14GA 120MM (NEEDLE) ×1 IMPLANT
NEEDLE INSUFFLATION 14GA 120MM (NEEDLE) ×2 IMPLANT
NS IRRIG 1000ML POUR BTL (IV SOLUTION) ×2 IMPLANT
PACK LAP CHOLE LZT030E (CUSTOM PROCEDURE TRAY) ×2 IMPLANT
PAD ARMBOARD 7.5X6 YLW CONV (MISCELLANEOUS) ×2 IMPLANT
POUCH SPECIMEN RETRIEVAL 10MM (ENDOMECHANICALS) ×2 IMPLANT
SET BASIN LINEN APH (SET/KITS/TRAYS/PACK) ×2 IMPLANT
SET TUBE IRRIG SUCTION NO TIP (IRRIGATION / IRRIGATOR) IMPLANT
SLEEVE ENDOPATH XCEL 5M (ENDOMECHANICALS) ×2 IMPLANT
SPONGE GAUZE 2X2 8PLY STRL LF (GAUZE/BANDAGES/DRESSINGS) ×8 IMPLANT
STAPLER VISISTAT (STAPLE) ×2 IMPLANT
SUT VICRYL 0 UR6 27IN ABS (SUTURE) ×2 IMPLANT
TAPE CLOTH SURG 4X10 WHT LF (GAUZE/BANDAGES/DRESSINGS) ×1 IMPLANT
TROCAR ENDO BLADELESS 11MM (ENDOMECHANICALS) ×2 IMPLANT
TROCAR XCEL NON-BLD 5MMX100MML (ENDOMECHANICALS) ×2 IMPLANT
TROCAR XCEL UNIV SLVE 11M 100M (ENDOMECHANICALS) ×2 IMPLANT
TUBING INSUFFLATION (TUBING) ×2 IMPLANT
WARMER LAPAROSCOPE (MISCELLANEOUS) ×2 IMPLANT
YANKAUER SUCT 12FT TUBE ARGYLE (SUCTIONS) ×1 IMPLANT

## 2015-07-30 NOTE — Anesthesia Postprocedure Evaluation (Signed)
Anesthesia Post Note  Patient: Rachel [name]  Procedure(s) Performed: Procedure(s) (LRB): LAPAROSCOPIC CHOLECYSTECTOMY (N/A)  Patient location during evaluation: PACU Anesthesia Type: General Level of consciousness: awake and alert and oriented Pain management: pain level controlled Vital Signs Assessment: post-procedure vital signs reviewed and stable Respiratory status: spontaneous breathing, patient connected to face mask oxygen and respiratory function stable Cardiovascular status: stable Postop Assessment: no signs of nausea or vomiting Anesthetic complications: no    Last Vitals:  Filed Vitals:   07/30/15 0840 07/30/15 0930  BP: 118/64   Pulse:    Temp:  36.6 C  Resp: 20     Last Pain:  Filed Vitals:   07/30/15 0953  PainSc: Asleep                 Prisila Dlouhy A

## 2015-07-30 NOTE — Discharge Instructions (Signed)
Laparoscopic Cholecystectomy, Care After °Refer to this sheet in the next few weeks. These instructions provide you with information about caring for yourself after your procedure. Your health care provider may also give you more specific instructions. Your treatment has been planned according to current medical practices, but problems sometimes occur. Call your health care provider if you have any problems or questions after your procedure. °WHAT TO EXPECT AFTER THE PROCEDURE °After your procedure, it is common to have: °· Pain at your incision sites. You will be given pain medicines to control your pain. °· Mild nausea or vomiting. This should improve after the first 24 hours. °· Bloating and possible shoulder pain from the gas that was used during the procedure. This will improve after the first 24 hours. °HOME CARE INSTRUCTIONS °Incision Care °· Follow instructions from your health care provider about how to take care of your incisions. Make sure you: °¨ Wash your hands with soap and water before you change your bandage (dressing). If soap and water are not available, use hand sanitizer. °¨ Change your dressing as told by your health care provider. °¨ Leave stitches (sutures), skin glue, or adhesive strips in place. These skin closures may need to be in place for 2 weeks or longer. If adhesive strip edges start to loosen and curl up, you may trim the loose edges. Do not remove adhesive strips completely unless your health care provider tells you to do that. °· Do not take baths, swim, or use a hot tub until your health care provider approves. Ask your health care provider if you can take showers. You may only be allowed to take sponge baths for bathing. °General Instructions °· Take over-the-counter and prescription medicines only as told by your health care provider. °· Do not drive or operate heavy machinery while taking prescription pain medicine. °· Return to your normal diet as told by your health care  provider. °· Do not lift anything that is heavier than 10 lb (4.5 kg). °· Do not play contact sports for one week or until your health care provider approves. °SEEK MEDICAL CARE IF:  °· You have redness, swelling, or pain at the site of your incision. °· You have fluid, blood, or pus coming from your incision. °· You notice a bad smell coming from your incision area. °· Your surgical incisions break open. °· You have a fever. °SEEK IMMEDIATE MEDICAL CARE IF: °· You develop a rash. °· You have difficulty breathing. °· You have chest pain. °· You have increasing pain in your shoulders (shoulder strap areas). °· You faint or have dizzy episodes while you are standing. °· You have severe pain in your abdomen. °· You have nausea or vomiting that lasts for more than one day. °  °This information is not intended to replace advice given to you by your health care provider. Make sure you discuss any questions you have with your health care provider. °  °Document Released: 01/23/2005 Document Revised: 10/14/2014 Document Reviewed: 09/04/2012 °Elsevier Interactive Patient Education ©2016 [employer] ° °

## 2015-07-30 NOTE — Anesthesia Procedure Notes (Signed)
Procedure Name: Intubation Date/Time: 07/30/2015 8:49 AM Performed by: Andree Elk, AMY A Pre-anesthesia Checklist: Patient identified, Patient being monitored, Timeout performed, Emergency Drugs available and Suction available Patient Re-evaluated:Patient Re-evaluated prior to inductionOxygen Delivery Method: Circle system utilized Preoxygenation: Pre-oxygenation with 100% oxygen Intubation Type: IV induction Ventilation: Mask ventilation without difficulty Laryngoscope Size: 3 and Miller Grade View: Grade II Tube type: Oral Tube size: 7.0 mm Number of attempts: 1 Airway Equipment and Method: Stylet Placement Confirmation: ETT inserted through vocal cords under direct vision,  positive ETCO2 and breath sounds checked- equal and bilateral Secured at: 21 cm Tube secured with: Tape Dental Injury: Teeth and Oropharynx as per pre-operative assessment

## 2015-07-30 NOTE — Anesthesia Preprocedure Evaluation (Signed)
Anesthesia Evaluation  Patient identified by MRN, date of birth, ID band Patient awake    Reviewed: Allergy & Precautions, NPO status , Patient's Chart, lab work & pertinent test results  Airway Mallampati: II  TM Distance: >3 FB Neck ROM: Full    Dental  (+) Teeth Intact, Missing   Pulmonary asthma , Current Smoker,    breath sounds clear to auscultation       Cardiovascular hypertension, Pt. on medications + CAD, + Cardiac Stents and + Peripheral Vascular Disease   Rhythm:Regular Rate:Normal     Neuro/Psych    GI/Hepatic GERD  Medicated and Controlled,  Endo/Other  diabetes, Type 2Hypothyroidism   Renal/GU      Musculoskeletal   Abdominal   Peds  Hematology   Anesthesia Other Findings   Reproductive/Obstetrics                             Anesthesia Physical Anesthesia Plan  ASA: III  Anesthesia Plan: General   Post-op Pain Management:    Induction: Intravenous  Airway Management Planned: Oral ETT  Additional Equipment:   Intra-op Plan:   Post-operative Plan: Extubation in OR  Informed Consent: I have reviewed the patients History and Physical, chart, labs and discussed the procedure including the risks, benefits and alternatives for the proposed anesthesia with the patient or authorized representative who has indicated his/her understanding and acceptance.     Plan Discussed with:   Anesthesia Plan Comments:         Anesthesia Quick Evaluation

## 2015-07-30 NOTE — Transfer of Care (Signed)
Immediate Anesthesia Transfer of Care Note  Patient: Rachel [name]  Procedure(s) Performed: Procedure(s): LAPAROSCOPIC CHOLECYSTECTOMY (N/A)  Patient Location: PACU  Anesthesia Type:General  Level of Consciousness: awake, oriented and patient cooperative  Airway & Oxygen Therapy: Patient Spontanous Breathing and Patient connected to face mask oxygen  Post-op Assessment: Report given to RN and Post -op Vital signs reviewed and stable  Post vital signs: Reviewed and stable  Last Vitals:  Filed Vitals:   07/30/15 0835 07/30/15 0840  BP: 128/65 118/64  Pulse:    Temp:    Resp: 21 20    Last Pain: There were no vitals filed for this visit.    Patients Stated Pain Goal: 8 ([phone])  Complications: No apparent anesthesia complications

## 2015-07-30 NOTE — Interval H&P Note (Signed)
History and Physical Interval Note:  07/30/2015 8:11 AM  Rachel [name]  has presented today for surgery, with the diagnosis of cholecystitis, cholelithiasis  The various methods of treatment have been discussed with the patient and family. After consideration of risks, benefits and other options for treatment, the patient has consented to  Procedure(s): LAPAROSCOPIC CHOLECYSTECTOMY (N/A) as a surgical intervention .  The patient's history has been reviewed, patient examined, no change in status, stable for surgery.  I have reviewed the patient's chart and labs.  Questions were answered to the patient's satisfaction.     Aviva Signs A

## 2015-07-30 NOTE — Op Note (Signed)
Patient:  Rachel [name]  DOB:  Sep 13, 1950  MRN:  [id number]   Preop Diagnosis:  Cholecystitis, cholelithiasis  Postop Diagnosis:  Same  Procedure:  Laparoscopic cholecystectomy  Surgeon:  Aviva Signs, M.D.  Anes:  Gen. endotracheal  Indications:  Patient is a 65 year old black female who recently was hospitalized for cholecystitis secondary to cholelithiasis. She was on Plavix, thus the head to be held and she now presents for lap scopic cholecystectomy. Risks and benefits of the procedure including bleeding, infection, hepatobiliary injury, and the possibility of an open procedure were fully explained to the patient, who gave informed consent.  Procedure note:  The patient was placed the supine position. After induction of general endotracheal anesthesia, the abdomen was prepped and draped using the usual sterile technique with DuraPrep. Surgical site confirmation was performed.  A supraumbilical incision was made down to the fascia. A Veress needle was introduced into the abdominal cavity and confirmation of placement was done using the saline drop test. The abdomen was then insufflated to 16 mmHg pressure. An 11 mm trocar was introduced into the abdominal cavity under direct visualization without difficulty. The patient was placed in reverse Trendelenburg position and an additional 11 mm trocar was placed the epigastric region 5 mm trochars were placed the right upper quadrant and right flank regions. There were some adhesions of omentum in the right upper quadrant. These were lysed from the abdominal wall using EndoShears. The liver was inspected and noted within normal limits. The gallbladder was retracted in a dynamic fashion in order to provide a critical view of the triangle of Calot. The cystic duct was first identified. Its juncture to the infundibulum was fully identified. Endoclips were placed proximally and distally on the cystic duct, and the cystic duct was divided. This was  likewise done cystic artery. The gallbladder was freed away from the gallbladder fossa using Bovie electrocautery. The gallbladder was delivered to the epigastric trocar site using an Endo Catch bag. The gallbladder fossa was inspected no abnormal bleeding or bile leakage was noted. Surgicel was placed the gallbladder fossa. All fluid and air were then evacuated from the abdominal cavity prior to removal of the trochars.  All wounds were irrigated with normal saline. All wounds were injected with 0.5% Sensorcaine. The supraumbilical fascia was reapproximated using an 0 Vicryl interrupted suture. All skin incisions were closed using staples. Betadine ointment and dry sterile dressings were applied.  All tape and needle counts were correct at the end of the procedure. Patient was extubated in the operating room and transferred to PACU in stable condition.  Complications:  None  EBL:  Minimal  Specimen:  Gallbladder

## 2015-08-03 ENCOUNTER — Encounter (HOSPITAL_COMMUNITY): Payer: Self-pay | Admitting: General Surgery

## 2015-08-13 ENCOUNTER — Encounter (HOSPITAL_COMMUNITY): Payer: BLUE CROSS/BLUE SHIELD

## 2015-08-17 ENCOUNTER — Telehealth: Payer: Self-pay | Admitting: Family Medicine

## 2015-08-17 ENCOUNTER — Other Ambulatory Visit: Payer: Self-pay | Admitting: *Deleted

## 2015-08-17 MED ORDER — FLUCONAZOLE 150 MG PO TABS: ORAL_TABLET | ORAL | Status: DC

## 2015-08-17 NOTE — Telephone Encounter (Signed)
Vaginal itching. Diflucan 150mg  #2 one po 3 days apart per dr Nicki Reaper. Med sent to pharm. Pt notified to follow up if symptoms do not clear up.

## 2015-08-17 NOTE — Telephone Encounter (Signed)
Pt is wanting to get diflucan x2 for a yeast infection   [facility]

## 2015-08-18 ENCOUNTER — Inpatient Hospital Stay (HOSPITAL_COMMUNITY): Admission: RE | Admit: 2015-08-18 | Payer: BLUE CROSS/BLUE SHIELD | Source: Ambulatory Visit

## 2015-08-19 ENCOUNTER — Ambulatory Visit (HOSPITAL_COMMUNITY)
Admission: RE | Admit: 2015-08-19 | Discharge: 2015-08-19 | Disposition: A | Discharge status: Home / Self Care | Payer: BLUE CROSS/BLUE SHIELD | Source: Ambulatory Visit | Attending: Cardiology | Admitting: Cardiology

## 2015-08-19 DIAGNOSIS — I10 Essential (primary) hypertension: Secondary | ICD-10-CM | POA: Insufficient documentation

## 2015-08-19 DIAGNOSIS — R938 Abnormal findings on diagnostic imaging of other specified body structures: Secondary | ICD-10-CM | POA: Diagnosis not present

## 2015-08-19 DIAGNOSIS — I2510 Atherosclerotic heart disease of native coronary artery without angina pectoris: Secondary | ICD-10-CM | POA: Diagnosis not present

## 2015-08-19 DIAGNOSIS — I739 Peripheral vascular disease, unspecified: Secondary | ICD-10-CM

## 2015-08-19 DIAGNOSIS — I700 Atherosclerosis of aorta: Secondary | ICD-10-CM | POA: Diagnosis not present

## 2015-08-19 DIAGNOSIS — K219 Gastro-esophageal reflux disease without esophagitis: Secondary | ICD-10-CM | POA: Insufficient documentation

## 2015-08-19 DIAGNOSIS — I708 Atherosclerosis of other arteries: Secondary | ICD-10-CM | POA: Insufficient documentation

## 2015-08-19 DIAGNOSIS — E785 Hyperlipidemia, unspecified: Secondary | ICD-10-CM | POA: Diagnosis not present

## 2015-08-19 DIAGNOSIS — E119 Type 2 diabetes mellitus without complications: Secondary | ICD-10-CM | POA: Insufficient documentation

## 2015-08-24 ENCOUNTER — Other Ambulatory Visit: Payer: Self-pay | Admitting: *Deleted

## 2015-08-24 DIAGNOSIS — I739 Peripheral vascular disease, unspecified: Secondary | ICD-10-CM

## 2015-09-12 ENCOUNTER — Other Ambulatory Visit: Payer: Self-pay | Admitting: Nurse Practitioner

## 2015-10-07 ENCOUNTER — Telehealth: Payer: Self-pay | Admitting: Cardiovascular Disease

## 2015-10-07 NOTE — Telephone Encounter (Signed)
Closed Encounter  °

## 2015-10-08 ENCOUNTER — Other Ambulatory Visit: Payer: Self-pay | Admitting: Family Medicine

## 2015-10-14 ENCOUNTER — Other Ambulatory Visit: Payer: Self-pay | Admitting: Cardiovascular Disease

## 2015-10-14 ENCOUNTER — Other Ambulatory Visit: Payer: Self-pay | Admitting: Family Medicine

## 2015-10-15 ENCOUNTER — Other Ambulatory Visit: Payer: Self-pay | Admitting: Family Medicine

## 2015-10-18 ENCOUNTER — Encounter: Payer: Self-pay | Admitting: Family Medicine

## 2015-10-18 ENCOUNTER — Ambulatory Visit (INDEPENDENT_AMBULATORY_CARE_PROVIDER_SITE_OTHER): Payer: BLUE CROSS/BLUE SHIELD | Admitting: Family Medicine

## 2015-10-18 VITALS — BP 110/70 | Ht 63.0 in | Wt 151.5 lb

## 2015-10-18 DIAGNOSIS — E119 Type 2 diabetes mellitus without complications: Secondary | ICD-10-CM

## 2015-10-18 DIAGNOSIS — Z23 Encounter for immunization: Secondary | ICD-10-CM | POA: Diagnosis not present

## 2015-10-18 DIAGNOSIS — Z794 Long term (current) use of insulin: Secondary | ICD-10-CM

## 2015-10-18 DIAGNOSIS — E0842 Diabetes mellitus due to underlying condition with diabetic polyneuropathy: Secondary | ICD-10-CM | POA: Diagnosis not present

## 2015-10-18 DIAGNOSIS — E785 Hyperlipidemia, unspecified: Secondary | ICD-10-CM

## 2015-10-18 LAB — POCT GLYCOSYLATED HEMOGLOBIN (HGB A1C): Hemoglobin A1C: 10.3

## 2015-10-18 MED ORDER — NORTRIPTYLINE HCL 25 MG PO CAPS: ORAL_CAPSULE | ORAL | 5 refills | Status: DC

## 2015-10-18 MED ORDER — INSULIN ASPART 100 UNIT/ML ~~LOC~~ SOLN: SUBCUTANEOUS | 5 refills | Status: DC

## 2015-10-18 MED ORDER — POTASSIUM CHLORIDE CRYS ER 20 MEQ PO TBCR: 20.0000 meq | EXTENDED_RELEASE_TABLET | Freq: Two times a day (BID) | ORAL | 5 refills | Status: DC

## 2015-10-18 MED ORDER — CANAGLIFLOZIN 100 MG PO TABS: 100.0000 mg | ORAL_TABLET | Freq: Every day | ORAL | 1 refills | Status: DC

## 2015-10-18 MED ORDER — LEVOTHYROXINE SODIUM 75 MCG PO TABS: ORAL_TABLET | ORAL | 1 refills | Status: DC

## 2015-10-18 MED ORDER — ROSUVASTATIN CALCIUM 20 MG PO TABS: 20.0000 mg | ORAL_TABLET | Freq: Every day | ORAL | 1 refills | Status: DC

## 2015-10-18 MED ORDER — OXYBUTYNIN CHLORIDE ER 10 MG PO TB24: 20.0000 mg | ORAL_TABLET | Freq: Every day | ORAL | 1 refills | Status: DC

## 2015-10-18 MED ORDER — METFORMIN HCL 500 MG PO TABS: ORAL_TABLET | ORAL | 5 refills | Status: DC

## 2015-10-18 MED ORDER — HYDROCHLOROTHIAZIDE 12.5 MG PO CAPS: 12.5000 mg | ORAL_CAPSULE | Freq: Every day | ORAL | 1 refills | Status: DC

## 2015-10-18 MED ORDER — BUPROPION HCL ER (SR) 150 MG PO TB12: 150.0000 mg | ORAL_TABLET | Freq: Every day | ORAL | 5 refills | Status: DC

## 2015-10-18 NOTE — Progress Notes (Signed)
   Subjective:    Patient ID: Rachel [name], female    DOB: [birth year], 65 y.o.   MRN: [id number]  Diabetes  She presents for her follow-up diabetic visit. She has type 2 diabetes mellitus. No MedicAlert identification noted. She has not had a previous visit with a dietitian. She does not see a podiatrist.Eye exam is not current.   Patient would like referral to podiatry. States no other concerns this visit.  Results for orders placed or performed in visit on 10/18/15  POCT HgB A1C  Result Value Ref Range   Hemoglobin A1C 10.3    Sugars up and downOn further history compliance with an [place] [facility] has not been complete. Patient also admits to dietary noncompliance. In addition we saw her half year ago she was supposed to follow-up in 3 months and did not.  Patient notes ongoing compliance with antidepressant medication. No obvious side effects. Reports does not miss a dose. Overall continues to help depression substantially. No thoughts of homicide or suicide. Would like to maintain medication.  Blood pressure medicine and blood pressure levels reviewed today with patient. Compliant with blood pressure medicine. States does not miss a dose. No obvious side effects. Blood pressure generally good when checked elsewhere. Watching salt intake.  Patient continues to take lipid medication regularly. No obvious side effects from it. Generally does not miss a dose. Prior blood work results are reviewed with patient. Patient continues to work on fat intake in diet   Review of Systems No headache, no major weight loss or weight gain, no chest pain no back pain abdominal pain no change in bowel habits complete ROS otherwise negative     Objective:   Physical Exam  Alert vitals stable, NAD. Blood pressure good on repeat. HEENT normal. Lungs clear. Heart regular rate and rhythm. C diabetic foot exam progressive neuropathy      Assessment & Plan:  Impression type 2 diabetes with still ongoing  poor noncompliance and poor control. Patient has basically strongly asked me to maintain her Morey Hummingbird no I would like to get her back to a specialist. She has had to give her 3 more months to work hard on things we will stop the Humulin R her request initiate NovoLog in addition we will increase the Lantus. Also increase metformin to 2 twice a day. Podiatry referral per patient request. Recheck in several months. Flu shot

## 2015-10-20 ENCOUNTER — Telehealth: Payer: Self-pay | Admitting: Family Medicine

## 2015-10-20 MED ORDER — FLUCONAZOLE 200 MG PO TABS: 200.0000 mg | ORAL_TABLET | Freq: Every day | ORAL | 0 refills | Status: DC

## 2015-10-20 MED ORDER — INSULIN ASPART 100 UNIT/ML FLEXPEN: PEN_INJECTOR | SUBCUTANEOUS | 5 refills | Status: DC

## 2015-10-20 NOTE — Telephone Encounter (Signed)
Notified patient that medications were sent to pharmacy.

## 2015-10-20 NOTE — Telephone Encounter (Signed)
I thought we talked about changing to pen yest in the lab area?,lets do if not done diflucsn 200 numb ten one dsaily

## 2015-10-20 NOTE — Telephone Encounter (Signed)
Patient says she was supposed to have Rx for diflucan called in on 10/18/15 for yeast infection, but pharmacy doesn't have anything.  Also, she said the Novalog pen was supposed to be called in as well, but the vials were called in.  She wants to know if we can get this switched to the pen.  [facility]

## 2015-10-22 ENCOUNTER — Ambulatory Visit: Payer: BLUE CROSS/BLUE SHIELD | Admitting: Cardiovascular Disease

## 2015-11-02 ENCOUNTER — Telehealth: Payer: Self-pay | Admitting: Family Medicine

## 2015-11-02 NOTE — Telephone Encounter (Signed)
Patient needs Rx for pen needles (ultrafine short [facility]) [facility]

## 2015-11-02 NOTE — Telephone Encounter (Signed)
Prescription called into [facility]. Patient notified.

## 2015-11-05 ENCOUNTER — Other Ambulatory Visit: Payer: Self-pay | Admitting: Cardiovascular Disease

## 2015-11-10 ENCOUNTER — Ambulatory Visit: Payer: BLUE CROSS/BLUE SHIELD | Admitting: Cardiovascular Disease

## 2015-11-12 ENCOUNTER — Telehealth: Payer: Self-pay | Admitting: Cardiovascular Disease

## 2015-11-12 ENCOUNTER — Encounter: Payer: Self-pay | Admitting: Cardiovascular Disease

## 2015-11-12 NOTE — Telephone Encounter (Signed)
Closed encounter °

## 2015-12-12 ENCOUNTER — Other Ambulatory Visit: Payer: Self-pay | Admitting: Nurse Practitioner

## 2015-12-17 ENCOUNTER — Ambulatory Visit: Payer: BLUE CROSS/BLUE SHIELD | Admitting: Cardiovascular Disease

## 2015-12-22 ENCOUNTER — Ambulatory Visit: Payer: BLUE CROSS/BLUE SHIELD | Admitting: Cardiovascular Disease

## 2016-01-24 ENCOUNTER — Telehealth: Payer: Self-pay | Admitting: Family Medicine

## 2016-01-24 DIAGNOSIS — M79672 Pain in left foot: Secondary | ICD-10-CM

## 2016-01-24 NOTE — Telephone Encounter (Signed)
Patient has done something to her foot and she is having trouble walking on it.  It has been giving her trouble for about a week and she doesn't recall hitting it on anything. It has progressively gotten worse and she wants to know if Dr. Richardson Landry would be willing to work her in his schedule some time?

## 2016-01-24 NOTE — Telephone Encounter (Signed)
X ray foot p;lus ov on wed

## 2016-01-24 NOTE — Telephone Encounter (Signed)
Xray in system. Patient transferred to front desk to schedule appointment.

## 2016-01-25 ENCOUNTER — Ambulatory Visit (HOSPITAL_COMMUNITY)
Admission: RE | Admit: 2016-01-25 | Discharge: 2016-01-25 | Disposition: A | Discharge status: Home / Self Care | Payer: BLUE CROSS/BLUE SHIELD | Source: Ambulatory Visit | Attending: Family Medicine | Admitting: Family Medicine

## 2016-01-25 DIAGNOSIS — M79672 Pain in left foot: Secondary | ICD-10-CM | POA: Diagnosis present

## 2016-01-26 ENCOUNTER — Ambulatory Visit (INDEPENDENT_AMBULATORY_CARE_PROVIDER_SITE_OTHER): Payer: BLUE CROSS/BLUE SHIELD | Admitting: Family Medicine

## 2016-01-26 ENCOUNTER — Encounter: Payer: Self-pay | Admitting: Family Medicine

## 2016-01-26 VITALS — BP 118/84 | Ht 63.5 in

## 2016-01-26 DIAGNOSIS — M79672 Pain in left foot: Secondary | ICD-10-CM | POA: Diagnosis not present

## 2016-01-26 MED ORDER — ETODOLAC 400 MG PO TABS: 400.0000 mg | ORAL_TABLET | Freq: Two times a day (BID) | ORAL | 0 refills | Status: DC

## 2016-01-26 MED ORDER — HYDROCODONE-ACETAMINOPHEN 5-325 MG PO TABS: 1.0000 | ORAL_TABLET | Freq: Four times a day (QID) | ORAL | 0 refills | Status: DC | PRN

## 2016-01-26 NOTE — Patient Instructions (Signed)
Plantar Fasciitis Plantar fasciitis is a painful foot condition that affects the heel. It occurs when the band of tissue that connects the toes to the heel bone (plantar fascia) becomes irritated. This can happen after exercising too much or doing other repetitive activities (overuse injury). The pain from plantar fasciitis can range from mild irritation to severe pain that makes it difficult for you to walk or move. The pain is usually worse in the morning or after you have been sitting or lying down for a while. CAUSES This condition may be caused by:  Standing for long periods of time.  Wearing shoes that do not fit.  Doing high-impact activities, including running, aerobics, and ballet.  Being overweight.  Having an abnormal way of walking (gait).  Having tight calf muscles.  Having high arches in your feet.  Starting a new athletic activity. SYMPTOMS The main symptom of this condition is heel pain. Other symptoms include:  Pain that gets worse after activity or exercise.  Pain that is worse in the morning or after resting.  Pain that goes away after you walk for a few minutes. DIAGNOSIS This condition may be diagnosed based on your signs and symptoms. Your health care provider will also do a physical exam to check for:  A tender area on the bottom of your foot.  A high arch in your foot.  Pain when you move your foot.  Difficulty moving your foot. You may also need to have imaging studies to confirm the diagnosis. These can include:  X-rays.  Ultrasound.  MRI. TREATMENT  Treatment for plantar fasciitis depends on the severity of the condition. Your treatment may include:  Rest, ice, and over-the-counter pain medicines to manage your pain.  Exercises to stretch your calves and your plantar fascia.  A splint that holds your foot in a stretched, upward position while you sleep (night splint).  Physical therapy to relieve symptoms and prevent problems in the  future.  Cortisone injections to relieve severe pain.  Extracorporeal shock wave therapy (ESWT) to stimulate damaged plantar fascia with electrical impulses. It is often used as a last resort before surgery.  Surgery, if other treatments have not worked after 12 months. HOME CARE INSTRUCTIONS  Take medicines only as directed by your health care provider.  Avoid activities that cause pain.  Roll the bottom of your foot over a bag of ice or a bottle of cold water. Do this for 20 minutes, 3-4 times a day.  Perform simple stretches as directed by your health care provider.  Try wearing athletic shoes with air-sole or gel-sole cushions or soft shoe inserts.  Wear a night splint while sleeping, if directed by your health care provider.  Keep all follow-up appointments with your health care provider. PREVENTION   Do not perform exercises or activities that cause heel pain.  Consider finding low-impact activities if you continue to have problems.  Lose weight if you need to. The best way to prevent plantar fasciitis is to avoid the activities that aggravate your plantar fascia. SEEK MEDICAL CARE IF:  Your symptoms do not go away after treatment with home care measures.  Your pain gets worse.  Your pain affects your ability to move or do your daily activities. This information is not intended to replace advice given to you by your health care provider. Make sure you discuss any questions you have with your health care provider. Document Released: 10/18/2000 Document Revised: 05/17/2015 Document Reviewed: 12/03/2013 Elsevier Interactive Patient Education    2017 [employer]vier Inc.  

## 2016-01-26 NOTE — Progress Notes (Signed)
   Subjective:    Patient ID: Rachel [name], female    DOB: Jun 15, 1950, 65 y.o.   MRN: [id number]  Foot Pain  This is a new problem. Episode onset: 2 weeks ago. Associated symptoms comments: Left foot pain. The symptoms are aggravated by walking and standing (pain all the time even when sitting and lyin down). She has tried rest and ice (oxycodone, ibuprofen 800mg  every 3 hours, soaking in warm water) for the symptoms. The treatment provided no relief.   Results for orders placed or performed in visit on 10/18/15  POCT HgB A1C  Result Value Ref Range   Hemoglobin A1C 10.3    Gradually developed, started first mild then really bad, trouble putting foot on the floor  Takes 800 mg ibu three times  Usually wears foot wear,   Very rough pai right at the start  Tender in the heel    Review of Systems No headache, no major weight loss or weight gain, no chest pain no back pain abdominal pain no change in bowel habits complete ROS otherwise negative     Objective:   Physical Exam Alert vitals stable, NAD. Blood pressure good on repeat. HEENT normal. Lungs clear. Heart regular rate and rhythm. Left heel distinctly tender to palpation  X-ray reviewed       Assessment & Plan:  Impression chronic plantar fascitis with early small discrete calcaneal spur discussed plan exercise discussed. Initiate anti-inflammatory medicine. We're heel pads. Education information given. Pain medicine prescribed. Follow-up with podiatrist. Patient already has as scheduled

## 2016-02-01 ENCOUNTER — Other Ambulatory Visit: Payer: Self-pay | Admitting: Family Medicine

## 2016-02-02 ENCOUNTER — Emergency Department (HOSPITAL_COMMUNITY)
Admission: EM | Admit: 2016-02-02 | Discharge: 2016-02-02 | Disposition: A | Discharge status: Home / Self Care | Payer: BLUE CROSS/BLUE SHIELD | Attending: Emergency Medicine | Admitting: Emergency Medicine

## 2016-02-02 ENCOUNTER — Encounter (HOSPITAL_COMMUNITY): Payer: Self-pay | Admitting: Emergency Medicine

## 2016-02-02 DIAGNOSIS — Z7982 Long term (current) use of aspirin: Secondary | ICD-10-CM | POA: Insufficient documentation

## 2016-02-02 DIAGNOSIS — I10 Essential (primary) hypertension: Secondary | ICD-10-CM | POA: Diagnosis not present

## 2016-02-02 DIAGNOSIS — E119 Type 2 diabetes mellitus without complications: Secondary | ICD-10-CM | POA: Diagnosis not present

## 2016-02-02 DIAGNOSIS — I2510 Atherosclerotic heart disease of native coronary artery without angina pectoris: Secondary | ICD-10-CM | POA: Insufficient documentation

## 2016-02-02 DIAGNOSIS — E039 Hypothyroidism, unspecified: Secondary | ICD-10-CM | POA: Diagnosis not present

## 2016-02-02 DIAGNOSIS — M79672 Pain in left foot: Secondary | ICD-10-CM | POA: Diagnosis present

## 2016-02-02 DIAGNOSIS — Z794 Long term (current) use of insulin: Secondary | ICD-10-CM | POA: Diagnosis not present

## 2016-02-02 DIAGNOSIS — M722 Plantar fascial fibromatosis: Secondary | ICD-10-CM

## 2016-02-02 DIAGNOSIS — F17210 Nicotine dependence, cigarettes, uncomplicated: Secondary | ICD-10-CM | POA: Diagnosis not present

## 2016-02-02 DIAGNOSIS — J45909 Unspecified asthma, uncomplicated: Secondary | ICD-10-CM | POA: Insufficient documentation

## 2016-02-02 NOTE — ED Triage Notes (Signed)
Patient complains of left foot pain. States she was diagnosed with plantar faciaitis by PCP.

## 2016-02-02 NOTE — Discharge Instructions (Signed)
I have reviewed your previous x-rays, and you have a spur on the heel bone. Your examination favors plantar fasciitis. I am in agreement with your primary physician that this is the problem with your foot. Please use the crutches to take some of the pressure off. Please continue your exercises and your Biofreeze. Please continue your current medications on. This may take a few weeks before it is better. Please see the foot specialist on January 3 as previously scheduled.

## 2016-02-02 NOTE — ED Provider Notes (Signed)
[facility] DEPT Provider Note   CSN: [id number] Arrival date & time: 02/02/16  1109     History   Chief Complaint Chief Complaint  Patient presents with  . Foot Pain    HPI Rachel [name] is a 65 y.o. female.  Patient is a 65 year old female who presents to the emergency department with a complaint of left foot pain. The patient states that she has been diagnosed with plantar fasciitis by her primary physician on. She has been placed on medication on for pain and inflammation. She is currently using Biofreeze, and she is using exercises. She states however that she still gets a significant amount of pain, and sometimes over most interferes with her activities of daily living. She's not had any fever or chills. She's not had any other issues or problems reported. The patient presents now for assistance with this issue.   The history is provided by the patient.  Foot Pain  Pertinent negatives include no chest pain, no abdominal pain and no shortness of breath.    Past Medical History:  Diagnosis Date  . Asthma   . Coronary atherosclerosis of native coronary artery    DES x 2 (overlapping) RCA 03/2008 - patent 2011  . Diabetes type 2, controlled ([place]) 2000  . Essential hypertension, benign   . Fatty liver   . GERD (gastroesophageal reflux disease)   . Hyperlipidemia   . Microalbuminuria   . Obesity   . Odynophagia    Severe esophagitis on CT 05/2010, treated empirically with Diflucan  . PAD (peripheral artery disease) (HCC)    history of left greater than right lower 70 claudication  . Tubular adenoma of colon 07/2007   Multiple colonic polyps  . Venous stasis     Patient Active Problem List   Diagnosis Date Noted  . Acute cholecystitis 07/23/2015  . Pseudoaneurysm of left femoral artery ([place]) 10/17/2014  . Claudication ([place]) 10/15/2014  . Hypokalemia 08/28/2014  . AKI (acute kidney injury) ([place]) 08/28/2014  . Diarrhea 08/28/2014  . High anion gap metabolic  acidosis 73/71/0626  . Hypothyroidism 02/07/2014  . Essential hypertension, benign 10/13/2013  . Peripheral arterial disease ([place]) 05/22/2013  . Meniere's disease 04/07/2013  . Diabetes ([place]) 03/11/2009  . Hyperlipidemia LDL goal <70 03/11/2009  . TOBACCO ABUSE 03/11/2009  . Coronary atherosclerosis of native coronary artery 03/11/2009    Past Surgical History:  Procedure Laterality Date  . CARDIAC CATHETERIZATION     stents  . CHOLECYSTECTOMY N/A 07/30/2015   Procedure: LAPAROSCOPIC CHOLECYSTECTOMY;  Surgeon: Aviva Signs, MD;  Location: [facility];  Service: General;  Laterality: N/A;  . COLONOSCOPY W/ POLYPECTOMY  07/2007   Tubular adenoma  . CORONARY STENT PLACEMENT    . CYSTECTOMY     Pilonidal  . ESOPHAGOGASTRODUODENOSCOPY  04/2010   RMR: Normal esophagus , Stomach D1 and D2   . PERIPHERAL VASCULAR CATHETERIZATION Bilateral 09/28/2014   Procedure: Lower Extremity Angiography;  Surgeon: [name], MD;  Location: [facility];  Service: Cardiovascular;  Laterality: Bilateral;  . PERIPHERAL VASCULAR CATHETERIZATION N/A 09/28/2014   Procedure: Abdominal Aortogram;  Surgeon: [name], MD;  Location: [facility];  Service: Cardiovascular;  Laterality: N/A;  . PERIPHERAL VASCULAR CATHETERIZATION N/A 10/15/2014   Procedure: Lower Extremity Angiography;  Surgeon: [name], MD;  Location: [facility];  Service: Cardiovascular;  Laterality: N/A;  . PERIPHERAL VASCULAR CATHETERIZATION  10/15/2014   Procedure: Peripheral Vascular Intervention;  Surgeon: [name], MD;  Location: [facility];  Service: Cardiovascular;;  bilateral iliac stents  . PILONIDAL CYST / SINUS EXCISION    . TUBAL LIGATION Bilateral     OB History    No data available       Home Medications    Prior to Admission medications   Medication Sig Start Date End Date Taking? Authorizing Provider  albuterol (PROVENTIL HFA;VENTOLIN HFA) 108 (90 Base) MCG/ACT inhaler 2  puffs every 4 -6 hours prn wheezing Patient not taking: Reported on 01/26/2016 06/18/15   Nilda Simmer, NP  aspirin EC 81 MG tablet Take 81 mg by mouth daily.      Historical Provider, MD  Blood Glucose Monitoring Suppl w/Device KIT Please dispense Glucose monitor per patient's preference or insurance with testing supplies. Test twice daily. Dx E11.9 04/12/15   Mikey Kirschner, MD  buPROPion [facility] SR) 150 MG 12 hr tablet Take 1 tablet (150 mg total) by mouth daily. 10/18/15   Mikey Kirschner, MD  canagliflozin (INVOKANA) 100 MG TABS tablet Take 1 tablet (100 mg total) by mouth daily. 10/18/15   Mikey Kirschner, MD  clopidogrel (PLAVIX) 75 MG tablet Take 1 tablet (75 mg total) by mouth daily. Please keep 11/10/15 appt for further refills Patient not taking: Reported on 01/26/2016 10/15/15   [name], MD  etodolac (LODINE) 400 MG tablet Take 1 tablet (400 mg total) by mouth 2 (two) times daily. Take with food 01/26/16   Mikey Kirschner, MD  hydrochlorothiazide (MICROZIDE) 12.5 MG capsule Take 1 capsule (12.5 mg total) by mouth daily. 10/18/15   Mikey Kirschner, MD  HYDROcodone-acetaminophen (NORCO/VICODIN) 5-325 MG tablet Take 1 tablet by mouth every 6 (six) hours as needed. Use sparingly 01/26/16   Mikey Kirschner, MD  insulin aspart (NOVOLOG) 100 UNIT/ML FlexPen Inject 6 units into skin at dinner. Take 10 units of glucose is greater than 175 10/20/15   Mikey Kirschner, MD  insulin glargine (LANTUS) 100 UNIT/ML injection Inject 64 Units into the skin at bedtime.    Historical Provider, MD  levothyroxine (SYNTHROID, LEVOTHROID) 75 MCG tablet TAKE 1 TABLET BY MOUTH DAILY BEFORE BREAKFAST 10/18/15   Mikey Kirschner, MD  levothyroxine (SYNTHROID, LEVOTHROID) 75 MCG tablet TAKE 1 TABLET BY MOUTH DAILY BEFORE BREAKFAST 02/01/16   Mikey Kirschner, MD  meclizine (ANTIVERT) 25 MG tablet TAKE 1 TABLET (25 MG TOTAL) BY MOUTH EVERY 6 (SIX) HOURS AS NEEDED FOR DIZZINESS OR NAUSEA. 01/15/14   Kathyrn Drown, MD  metFORMIN (GLUCOPHAGE) 500 MG tablet Take 2 tablets by mouth twice a day. 10/18/15   Mikey Kirschner, MD  metroNIDAZOLE (FLAGYL) 500 MG tablet Take 1 tablet (500 mg total) by mouth 3 (three) times daily. Patient not taking: Reported on 10/18/2015 07/24/15   Silver Huguenin Elgergawy, MD  oxybutynin (DITROPAN-XL) 10 MG 24 hr tablet Take 2 tablets (20 mg total) by mouth daily. 10/18/15   Mikey Kirschner, MD  oxyCODONE-acetaminophen (PERCOCET) 5-325 MG tablet Take 1 tablet by mouth every 4 (four) hours as needed for severe pain. 07/30/15   Aviva Signs, MD  pantoprazole (PROTONIX) 40 MG tablet TAKE 1 TABLET BY MOUTH EVERY DAY 12/13/15   Mikey Kirschner, MD  potassium chloride SA (KLOR-CON M20) 20 MEQ tablet Take 1 tablet (20 mEq total) by mouth 2 (two) times daily. 10/18/15   Mikey Kirschner, MD  rosuvastatin (CRESTOR) 20 MG tablet Take 1 tablet (20 mg total) by mouth at bedtime. 10/18/15  Mikey Kirschner, MD  saccharomyces boulardii (FLORASTOR) 250 MG capsule Take 1 capsule (250 mg total) by mouth 2 (two) times daily. Patient not taking: Reported on 01/26/2016 07/24/15   Albertine Patricia, MD    Family History Family History  Problem Relation Age of Onset  . Bone cancer Father   . Heart disease Maternal Grandmother   . Hypertension Mother   . Colon cancer Neg Hx   . Liver disease Neg Hx   . Inflammatory bowel disease Neg Hx     Social History Social History  Substance Use Topics  . Smoking status: Current Every Day Smoker    Packs/day: 0.50    Years: 47.00    Types: Cigarettes  . Smokeless tobacco: Never Used  . Alcohol use No     Allergies   Altace [ramipril]; Biaxin [clarithromycin]; Niacin and related; and Nsaids   Review of Systems Review of Systems  Constitutional: Negative for activity change.       All ROS Neg except as noted in HPI  HENT: Negative for nosebleeds.   Eyes: Negative for photophobia and discharge.  Respiratory: Negative for cough, shortness of  breath and wheezing.   Cardiovascular: Negative for chest pain and palpitations.  Gastrointestinal: Negative for abdominal pain and blood in stool.  Genitourinary: Negative for dysuria, frequency and hematuria.  Musculoskeletal: Negative for arthralgias, back pain and neck pain.       Foot pain  Skin: Negative.   Neurological: Negative for dizziness, seizures and speech difficulty.  Psychiatric/Behavioral: Negative for confusion and hallucinations.     Physical Exam Updated Vital Signs BP 153/83 (BP Location: Left Arm)   Pulse 90   Temp 98.2 F (36.8 C)   Resp 16   Ht 5' 3"  (1.6 m)   Wt 68 kg   SpO2 100%   BMI 26.57 kg/m   Physical Exam  Constitutional: She is oriented to person, place, and time. She appears well-developed and well-nourished.  Non-toxic appearance.  HENT:  Head: Normocephalic.  Right Ear: Tympanic membrane and external ear normal.  Left Ear: Tympanic membrane and external ear normal.  Eyes: EOM and lids are normal. Pupils are equal, round, and reactive to light.  Neck: Normal range of motion. Neck supple. Carotid bruit is not present.  Cardiovascular: Normal rate, regular rhythm, normal heart sounds, intact distal pulses and normal pulses.   Pulmonary/Chest: Breath sounds normal. No respiratory distress.  Abdominal: Soft. Bowel sounds are normal. There is no tenderness. There is no guarding.  Musculoskeletal:       Left foot: There is decreased range of motion and tenderness. There is no deformity.       Feet:  Lymphadenopathy:       Head (right side): No submandibular adenopathy present.       Head (left side): No submandibular adenopathy present.    She has no cervical adenopathy.  Neurological: She is alert and oriented to person, place, and time. She has normal strength. No cranial nerve deficit or sensory deficit.  Skin: Skin is warm and dry.  Psychiatric: She has a normal mood and affect. Her speech is normal.  Nursing note and vitals  reviewed.    ED Treatments / Results  Labs (all labs ordered are listed, but only abnormal results are displayed) Labs Reviewed - No data to display  EKG  EKG Interpretation None       Radiology No results found.  Procedures Procedures (including critical care time)  Medications Ordered in ED  Medications - No data to display   Initial Impression / Assessment and Plan / ED Course  I have reviewed the triage vital signs and the nursing notes.  Pertinent labs & imaging results that were available during my care of the patient were reviewed by me and considered in my medical decision making (see chart for details).  Clinical Course     *I have reviewed nursing notes, vital signs, and all appropriate lab and imaging results for this patient.**  Final Clinical Impressions(s) / ED Diagnoses MDM Dorsalis pedis is 2+. There no temperature changes of the foot. Doubt vascular issue. Patient is not had any recent injury or trauma. Out fracture or trauma issue. There's no significant swelling, no temperature elevation, and no history for infection.  Patient was diagnosed recently with plantar fasciitis. X-ray of the foot reveals spurs and some degenerative changes. The patient states that she has been on medication for about a week, and feels that the pain is not really getting a lot better. I reassured the patient that it will take a few weeks before she really sees a lot of change in her pain and discomfort. The patient is already using exercises, she is on medication. I've offered her crutches to use for more painful days. She is scheduled to see a podiatrist on 02/09/2016.    Final diagnoses:  Plantar fasciitis of left foot    New Prescriptions New Prescriptions   No medications on file     Lily Kocher, Hershal Coria 02/02/16 1329    Virgel Manifold, MD 02/04/16 1149

## 2016-02-09 ENCOUNTER — Ambulatory Visit: Payer: BLUE CROSS/BLUE SHIELD | Admitting: Family Medicine

## 2016-02-11 ENCOUNTER — Ambulatory Visit: Payer: BLUE CROSS/BLUE SHIELD | Admitting: Cardiovascular Disease

## 2016-02-15 ENCOUNTER — Ambulatory Visit: Payer: BLUE CROSS/BLUE SHIELD | Admitting: Family Medicine

## 2016-02-23 ENCOUNTER — Ambulatory Visit: Payer: BLUE CROSS/BLUE SHIELD | Admitting: Family Medicine

## 2016-03-03 ENCOUNTER — Encounter: Payer: Self-pay | Admitting: Family Medicine

## 2016-03-03 ENCOUNTER — Ambulatory Visit (INDEPENDENT_AMBULATORY_CARE_PROVIDER_SITE_OTHER): Payer: BLUE CROSS/BLUE SHIELD | Admitting: Family Medicine

## 2016-03-03 VITALS — BP 132/84 | Ht 63.5 in | Wt 155.2 lb

## 2016-03-03 DIAGNOSIS — E119 Type 2 diabetes mellitus without complications: Secondary | ICD-10-CM

## 2016-03-03 DIAGNOSIS — I739 Peripheral vascular disease, unspecified: Secondary | ICD-10-CM

## 2016-03-03 DIAGNOSIS — M25561 Pain in right knee: Secondary | ICD-10-CM

## 2016-03-03 DIAGNOSIS — M25562 Pain in left knee: Secondary | ICD-10-CM | POA: Diagnosis not present

## 2016-03-03 LAB — POCT GLYCOSYLATED HEMOGLOBIN (HGB A1C): Hemoglobin A1C: 10

## 2016-03-03 MED ORDER — INSULIN GLARGINE 100 UNIT/ML ~~LOC~~ SOLN: 82.0000 [IU] | Freq: Every day | SUBCUTANEOUS | 5 refills | Status: DC

## 2016-03-03 MED ORDER — HYDROCODONE-ACETAMINOPHEN 5-325 MG PO TABS: 1.0000 | ORAL_TABLET | Freq: Four times a day (QID) | ORAL | 0 refills | Status: DC | PRN

## 2016-03-03 NOTE — Progress Notes (Signed)
   Subjective:    Patient ID: Rachel [name], female    DOB: 11-11-1950, 66 y.o.   MRN: [id number]  Diabetes  She presents for her follow-up diabetic visit. She has type 2 diabetes mellitus. Risk factors for coronary artery disease include diabetes mellitus and post-menopausal. Current diabetic treatment includes insulin injections. She is compliant with treatment all of the time. Her weight is stable. She is following a diabetic diet.    Blood pressure medicine and blood pressure levels reviewed today with patient. Compliant with blood pressure medicine. States does not miss a dose. No obvious side effects. Blood pressure generally good when checked elsewhere. Watching salt intake.  Patient has seen podiatrist for her plantar fasciitis. Had an injection of her left heel which helps him a few weeks ago. Just saw the podiatrist again yesterday who salsa new changes on her skin and expresses concern to her that he felt this was not simply plantar fascitis.  Has a history of peripheral arterial disease. No claudication currently. Was unable to follow-up with vascular specialist as scheduled last fall. So therefore has not had appropriate follow-up testing.  Unfortunately continues to smoke.  Unfortunately continues to have elevated sugars. Patient has been very resistant to the idea of going back to an endocrinologist  She is on low-dose info, and continues to experience substantial sugars  Results for orders placed or performed in visit on 03/03/16  POCT glycosylated hemoglobin (Hb A1C)  Result Value Ref Range   Hemoglobin A1C 10.0     Morning sugar 139 or 159 or so     Out of etodolac  'stickin   Review of Systems No headache, no major weight loss or weight gain, no chest pain no back pain abdominal pain no change in bowel habits complete ROS otherwise negative     Objective:   Physical Exam Alert vitals stable, NAD. Blood pressure good on repeat. HEENT normal. Lungs clear.  Heart regular rate and rhythm. Ankles trace edema bilateral right somewhat more than left. Pulses diminished right foot and barely palpable left foot. Distal capillary refill adequate both feet, however lateral left foot shows some erythema and chronic skin changes reminiscent of micro-angiopathic issues.       Assessment & Plan:  Impression #1 type 2 diabetes poor control. Admits to dietary noncompliance. Low-dose invokana has not helped, I do not want to increase this and in fact will stop it today with patients peripheral arterial situation Today I basically insisted she has to get back to an endocrinologist. She expressed understanding and is willing to go back to see Dr. Dorris Fetch. #2 peripheral arterial disease. Concerning with history of prior prior procedures and ongoing smoking and poor control of diabetes. Patient was due to have follow-up arterial studies last year and for various reasons could not make this happen with the vascular folks. We will schedule our own arterial studies quickly. Patient states due to see her vascular surgeon in a couple weeks. No rest pain. But does have sensitivity and pain at times when walking in her left foot. Plan arterial studies as noted. Maintain chronic meds. Diet exercise discussed. Patient strongly encouraged to stop smoking. Referral back to Dr. Dorris Fetch and await arterial results

## 2016-03-06 ENCOUNTER — Ambulatory Visit (HOSPITAL_COMMUNITY)
Admission: RE | Admit: 2016-03-06 | Discharge: 2016-03-06 | Disposition: A | Discharge status: Home / Self Care | Payer: BLUE CROSS/BLUE SHIELD | Source: Ambulatory Visit | Attending: Family Medicine | Admitting: Family Medicine

## 2016-03-06 DIAGNOSIS — M25562 Pain in left knee: Secondary | ICD-10-CM | POA: Insufficient documentation

## 2016-03-06 DIAGNOSIS — R936 Abnormal findings on diagnostic imaging of limbs: Secondary | ICD-10-CM | POA: Insufficient documentation

## 2016-03-06 DIAGNOSIS — M25561 Pain in right knee: Secondary | ICD-10-CM | POA: Diagnosis not present

## 2016-03-06 DIAGNOSIS — E119 Type 2 diabetes mellitus without complications: Secondary | ICD-10-CM | POA: Insufficient documentation

## 2016-03-07 ENCOUNTER — Encounter: Payer: Self-pay | Admitting: Cardiovascular Disease

## 2016-03-07 ENCOUNTER — Ambulatory Visit (INDEPENDENT_AMBULATORY_CARE_PROVIDER_SITE_OTHER): Payer: BLUE CROSS/BLUE SHIELD | Admitting: Cardiovascular Disease

## 2016-03-07 VITALS — BP 126/76 | HR 106 | Ht 63.5 in | Wt 156.8 lb

## 2016-03-07 DIAGNOSIS — E039 Hypothyroidism, unspecified: Secondary | ICD-10-CM

## 2016-03-07 DIAGNOSIS — I2510 Atherosclerotic heart disease of native coronary artery without angina pectoris: Secondary | ICD-10-CM

## 2016-03-07 DIAGNOSIS — I6529 Occlusion and stenosis of unspecified carotid artery: Secondary | ICD-10-CM | POA: Diagnosis not present

## 2016-03-07 DIAGNOSIS — I10 Essential (primary) hypertension: Secondary | ICD-10-CM | POA: Diagnosis not present

## 2016-03-07 DIAGNOSIS — I739 Peripheral vascular disease, unspecified: Secondary | ICD-10-CM

## 2016-03-07 NOTE — Assessment & Plan Note (Signed)
History of peripheral arterial disease status post complex for arterial intervention by myself 09/28/2014 with three-vessel rotation of a left iliac CTO and right iliac stenosis using "kissing stent technique and covered stents. The procedure was,. By hematoma with a left common femoral pseudoaneurysm. Her claudication ultimately resolved. Her Dopplers performed 08/19/15 revealed widely patent stents with normal ABIs. She's had pain in her left foot recently and recent Dopplers performed yesterday revealed a right ABI 0.98 and left 0.64. She does have reddish discoloration on the heel of her left foot which is painful suggesting ischemic changes. On going to repeat her Dopplers/lower arterial duplex arrange for undergo angiography on Monday via the right femoral approach.

## 2016-03-07 NOTE — Progress Notes (Signed)
03/07/2016 Rachel [name]   [birth year]  [id number]  Primary Physician [name], MD Primary Cardiologist: [name] MD Renae Gloss  HPI: Mrs. Durell is a delightful 66 year old mildly overweight divorced African-American female mother of one, grandmother of one grandchild who is referred by Dr. [name] for lifestyle limiting claudication. I last saw her in the office 11/06/14.Her primary care physician is Dr. [name]. She has a history of tobacco abuse smoking one pack per day for last 45 years, treated hypertension, diabetes and hyperlipidemia. She does have ischemic heart disease status post RCA intervention February 2010 with overlapping drug eluting stents in her RCA. She had repeat cath the following year revealing these to be widely patent. She complains of right lower extremity claudication claudication left greater than right over the last 2 years. 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 I reviewed this with Dr. Trula Slade who suggested that we attempt percutaneous vascular station before considering aortobifemoral bypass grafting. I angiogram on 10/15/14 and was able to cross her left common iliac chronic total occlusion. I stented both iliac arteries with I cast cover stents using "kissing stent technique". Her procedure was stopped. By a significant hematoma in the left side with a left common femoral pseudoaneurysm that was partially thrombosed initially and follow-up Doppler was completely thrombosed. Her hemoglobin stabilized not requiring transfusion. Her claudication has resolved although she still has some pain in her left hip from resolving hematoma. Her Doppler studies showed  post procedure normal ABIs bilaterally and her claudication resolved..  Since I saw her last she developed ischemic changes on her left heel. Her left ABI has dropped to 0.64. Her left femoral pulse is much weaker than her right. I suspect her left iliac stent is occluded. We will repeat lower extremity arterial Doppler studies in the next one to 2 days and I will arrange for her to undergo angiography at [facility] on February 5.   Current Outpatient Prescriptions  Medication Sig Dispense Refill  . aspirin EC 81 MG tablet Take 81 mg by mouth daily.      . Blood Glucose Monitoring Suppl w/Device KIT Please dispense Glucose monitor per patient's preference or insurance with testing supplies. Test twice daily. Dx E11.9 1 each PRN  . buPROPion (WELLBUTRIN SR) 150 MG 12 hr tablet Take 1 tablet (150 mg total) by mouth daily. 30 tablet 5  . etodolac (LODINE) 400 MG tablet Take 1 tablet (400 mg total) by mouth 2 (two) times daily. Take with food 28 tablet 0  . gabapentin (NEURONTIN) 100 MG capsule Take 300 mg by mouth at bedtime.  6  . hydrochlorothiazide (MICROZIDE) 12.5 MG capsule Take 1 capsule (12.5 mg total) by mouth daily. 90 capsule 1  . HYDROcodone-acetaminophen (NORCO/VICODIN) 5-325 MG tablet Take 1 tablet by mouth every 6 (six) hours as needed. Use sparingly 36 tablet 0  . insulin aspart (NOVOLOG) 100 UNIT/ML FlexPen Inject 6 units into skin at dinner. Take 10 units of glucose is greater than 175 15 mL 5  . insulin glargine (LANTUS) 100 UNIT/ML injection Inject 0.82 mLs (82 Units total) into the skin at bedtime. 10 mL 5  . levothyroxine (SYNTHROID, LEVOTHROID) 75 MCG tablet TAKE 1 TABLET  BY MOUTH DAILY BEFORE BREAKFAST 90 tablet 1  . meclizine (ANTIVERT) 25 MG tablet TAKE 1 TABLET (25 MG TOTAL) BY MOUTH EVERY 6 (SIX) HOURS AS NEEDED FOR DIZZINESS OR NAUSEA. 30 tablet 3  . metFORMIN (GLUCOPHAGE) 500 MG tablet Take 2 tablets by mouth twice a day. 120 tablet 5  . oxybutynin (DITROPAN-XL)  10 MG 24 hr tablet Take 2 tablets (20 mg total) by mouth daily. 180 tablet 1  . oxyCODONE-acetaminophen (PERCOCET) 5-325 MG tablet Take 1 tablet by mouth every 4 (four) hours as needed for severe pain. 50 tablet 0  . pantoprazole (PROTONIX) 40 MG tablet TAKE 1 TABLET BY MOUTH EVERY DAY 30 tablet 2  . potassium chloride SA (KLOR-CON M20) 20 MEQ tablet Take 1 tablet (20 mEq total) by mouth 2 (two) times daily. 60 tablet 5  . rosuvastatin (CRESTOR) 20 MG tablet Take 1 tablet (20 mg total) by mouth at bedtime. 90 tablet 1  . saccharomyces boulardii (FLORASTOR) 250 MG capsule Take 1 capsule (250 mg total) by mouth 2 (two) times daily. 20 capsule 0   No current facility-administered medications for this visit.     Allergies  Allergen Reactions  . Altace [Ramipril] Cough  . Biaxin [Clarithromycin]     Fatigue  . Niacin And Related Other (See Comments)    Flush - felt like on fire.  . Nsaids Other (See Comments)    feverish    Social History   Social History  . Marital status: Divorced    Spouse name: N/A  . Number of children: 1  . 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   Social History Main Topics  . Smoking status: Current Every Day Smoker    Packs/day: 0.50    Years: 47.00    Types: Cigarettes  . Smokeless tobacco: Never Used  . Alcohol use No  . Drug use: No  . Sexual activity: Yes    Partners: Male    Birth control/ protection: Condom     Comment: mutual friends   Other Topics Concern  . Not on file   Social History Narrative  . No narrative on file     Review of Systems: General: negative for chills, fever, night sweats or weight changes.  Cardiovascular: negative for chest pain, dyspnea on exertion, edema, orthopnea, palpitations, paroxysmal nocturnal dyspnea or shortness of breath Dermatological: negative for rash Respiratory: negative for cough or wheezing Urologic: negative for hematuria Abdominal: negative for  nausea, vomiting, diarrhea, bright red blood per rectum, melena, or hematemesis Neurologic: negative for visual changes, syncope, or dizziness All other systems reviewed and are otherwise negative except as noted above.    Blood pressure 126/76, pulse (!) 106, height 5' 3.5" (1.613 m), weight 156 lb 12.8 oz (71.1 kg).  General appearance: alert and no distress Neck: no adenopathy, no carotid bruit, no JVD, supple, symmetrical, trachea midline and thyroid not enlarged, symmetric, no tenderness/mass/nodules Lungs: clear to auscultation bilaterally Heart: regular rate and rhythm, S1, S2 normal, no murmur, click, rub or gallop Extremities: extremities normal, atraumatic, no cyanosis or edema and Decrease left common femoral pulse  EKG sinus tachycardia 106 without ST or T-wave changes. I personally reviewed this EKG  ASSESSMENT AND PLAN:   Hyperlipidemia LDL goal <70 History of hyperlipidemia on statin therapy with lipid profile performed 04/17/15 and LDL 110 ratio of 33.  TOBACCO ABUSE History of continued tobacco abuse of 5 or 6 cigarettes a day recalcitrant to risk  factor modification  Coronary atherosclerosis of native coronary artery History of coronary artery disease status post RCA intervention with overlapping drug-eluting stents February 2010. She denies chest pain or shortness of breath.  Peripheral arterial disease ([place]) History of peripheral arterial disease status post complex for arterial intervention by myself 09/28/2014 with three-vessel rotation of a left iliac CTO and right iliac stenosis using "kissing stent technique and covered stents. The procedure was,. By hematoma with a left common femoral pseudoaneurysm. Her claudication ultimately resolved. Her Dopplers performed 08/19/15 revealed widely patent stents with normal ABIs. She's had pain in her left foot recently and recent Dopplers performed yesterday revealed a right ABI 0.98 and left 0.64. She does have reddish  discoloration on the heel of her left foot which is painful suggesting ischemic changes. On going to repeat her Dopplers/lower arterial duplex arrange for undergo angiography on Monday via the right femoral approach.  Essential hypertension, benign History of hypertension blood pressure measured at 126/76. She is on hydrochlorothiazide. He current meds at current dosing      [name] MD [facility], [facility] 03/07/2016 4:00 PM

## 2016-03-07 NOTE — Patient Instructions (Signed)
Your physician has requested that you have a lower extremity arterial duplex. During this test, ultrasound is used to evaluate arterial blood flow in the legs. Allow one hour for this exam. There are no restrictions or special instructions. (Prior to procedure--this week per Dr. Gwenlyn Found) Your physician has requested that you have an ankle brachial index (ABI). During this test an ultrasound and blood pressure cuff are used to evaluate the arteries that supply the arms and legs with blood. Allow thirty minutes for this exam. There are no restrictions or special instructions.  Your physician has requested that you have a carotid duplex. This test is an ultrasound of the carotid arteries in your neck. It looks at blood flow through these arteries that supply the brain with blood. Allow one hour for this exam. There are no restrictions or special instructions.    Dr. Gwenlyn Found has ordered a peripheral angiogram on 03/13/16 with Dr. Gwenlyn Found to be done at [facility].  This procedure is going to look at the bloodflow in your lower extremities.  If Dr. Gwenlyn Found is able to open up the arteries, you will have to spend one night in the hospital.  If he is not able to open the arteries, you will be able to go home that same day.    After the procedure, you will not be allowed to drive for 3 days or push, pull, or lift anything greater than 10 lbs for one week.    You will be required to have the following tests prior to the procedure:  1. Blood work-the blood work can be done no more than 14 days prior to the procedure.  It can be done at any [facility] lab.  There is one downstairs on the first floor of this building and one in the [facility] building [id number], [address])  2. Chest Xray-the chest xray order has already been placed at the [place].      Puncture site--Rt Groin

## 2016-03-07 NOTE — Addendum Note (Signed)
Addended by: Therisa Doyne on: 03/07/2016 04:15 PM   Modules accepted: Orders

## 2016-03-07 NOTE — Assessment & Plan Note (Signed)
History of hyperlipidemia on statin therapy with lipid profile performed 04/17/15 and LDL 110 ratio of 33.

## 2016-03-07 NOTE — Assessment & Plan Note (Signed)
History of hypertension blood pressure measured at 126/76. She is on hydrochlorothiazide. He current meds at current dosing

## 2016-03-07 NOTE — Assessment & Plan Note (Signed)
History of coronary artery disease status post RCA intervention with overlapping drug-eluting stents February 2010. She denies chest pain or shortness of breath.

## 2016-03-07 NOTE — Assessment & Plan Note (Signed)
History of continued tobacco abuse of 5 or 6 cigarettes a day recalcitrant to risk factor modification

## 2016-03-09 ENCOUNTER — Ambulatory Visit
Admission: RE | Admit: 2016-03-09 | Discharge: 2016-03-09 | Disposition: A | Discharge status: Home / Self Care | Payer: BLUE CROSS/BLUE SHIELD | Source: Ambulatory Visit | Attending: Cardiovascular Disease | Admitting: Cardiovascular Disease

## 2016-03-09 ENCOUNTER — Ambulatory Visit (HOSPITAL_COMMUNITY)
Admission: RE | Admit: 2016-03-09 | Discharge: 2016-03-09 | Disposition: A | Discharge status: Home / Self Care | Payer: BLUE CROSS/BLUE SHIELD | Source: Ambulatory Visit | Attending: Cardiovascular Disease | Admitting: Cardiovascular Disease

## 2016-03-09 ENCOUNTER — Other Ambulatory Visit: Payer: Self-pay | Admitting: Cardiovascular Disease

## 2016-03-09 ENCOUNTER — Encounter: Payer: Self-pay | Admitting: Cardiovascular Disease

## 2016-03-09 ENCOUNTER — Telehealth: Payer: Self-pay | Admitting: Cardiovascular Disease

## 2016-03-09 DIAGNOSIS — I10 Essential (primary) hypertension: Secondary | ICD-10-CM | POA: Insufficient documentation

## 2016-03-09 DIAGNOSIS — I6523 Occlusion and stenosis of bilateral carotid arteries: Secondary | ICD-10-CM | POA: Insufficient documentation

## 2016-03-09 DIAGNOSIS — I6529 Occlusion and stenosis of unspecified carotid artery: Secondary | ICD-10-CM

## 2016-03-09 DIAGNOSIS — I739 Peripheral vascular disease, unspecified: Secondary | ICD-10-CM

## 2016-03-09 DIAGNOSIS — I2510 Atherosclerotic heart disease of native coronary artery without angina pectoris: Secondary | ICD-10-CM | POA: Insufficient documentation

## 2016-03-09 DIAGNOSIS — E785 Hyperlipidemia, unspecified: Secondary | ICD-10-CM | POA: Insufficient documentation

## 2016-03-09 DIAGNOSIS — E1151 Type 2 diabetes mellitus with diabetic peripheral angiopathy without gangrene: Secondary | ICD-10-CM | POA: Insufficient documentation

## 2016-03-09 DIAGNOSIS — I700 Atherosclerosis of aorta: Secondary | ICD-10-CM | POA: Insufficient documentation

## 2016-03-09 NOTE — Telephone Encounter (Signed)
Patient is calling to let you know that she did have the chest xray and blood work completed today. Patient is not requesting a call back unless necessary. Thanks.

## 2016-03-09 NOTE — Telephone Encounter (Signed)
Left message--ok per DPR--to remind pt to have labs and CXR done today or tomorrow for procedure scheduled with Dr. Gwenlyn Found on 03/13/16.

## 2016-03-10 ENCOUNTER — Other Ambulatory Visit: Payer: Self-pay | Admitting: Family Medicine

## 2016-03-10 LAB — BASIC METABOLIC PANEL WITH GFR
BUN: 14 mg/dL (ref 7–25)
CO2: 25 mmol/L (ref 20–31)
CREATININE: 0.95 mg/dL (ref 0.50–0.99)
Calcium: 9.8 mg/dL (ref 8.6–10.4)
Chloride: 103 mmol/L (ref 98–110)
GFR, EST AFRICAN AMERICAN: 74 mL/min (ref >=60)
GFR, Est Non African American: 64 mL/min (ref >=60)
Glucose, Bld: 55 mg/dL — ABNORMAL LOW (ref 65–99)
Potassium: 4.9 mmol/L (ref 3.5–5.3)
Sodium: 138 mmol/L (ref 135–146)

## 2016-03-10 LAB — CBC WITH DIFFERENTIAL/PLATELET
BASOS ABS: 0 {cells}/uL (ref 0–200)
BASOS PCT: 0 %
Eosinophils Absolute: 198 cells/uL (ref 15–500)
Eosinophils Relative: 2 %
HCT: 39.3 % (ref 35.0–45.0)
HEMOGLOBIN: 12.9 g/dL (ref 11.7–15.5)
Lymphocytes Relative: 29 %
Lymphs Abs: 2871 cells/uL (ref 850–3900)
MCH: 28.5 pg (ref 27.0–33.0)
MCHC: 32.8 g/dL (ref 32.0–36.0)
MCV: 86.9 fL (ref 80.0–100.0)
MONO ABS: 495 {cells}/uL (ref 200–950)
MONOS PCT: 5 %
MPV: 9.8 fL (ref 7.5–12.5)
Neutro Abs: 6336 cells/uL (ref 1500–7800)
Neutrophils Relative %: 64 %
PLATELETS: 316 10*3/uL (ref 140–400)
RBC: 4.52 MIL/uL (ref 3.80–5.10)
RDW: 16.1 % — ABNORMAL HIGH (ref 11.0–15.0)
WBC: 9.9 10*3/uL (ref 3.8–10.8)

## 2016-03-10 LAB — TSH: TSH: 8.56 m[IU]/L — AB

## 2016-03-10 LAB — PROTIME-INR
INR: 1
Prothrombin Time: 10.4 s (ref 9.0–11.5)

## 2016-03-10 LAB — APTT: APTT: 27 s (ref 22–34)

## 2016-03-13 ENCOUNTER — Ambulatory Visit (HOSPITAL_COMMUNITY)
Admission: RE | Admit: 2016-03-13 | Discharge: 2016-03-13 | Disposition: A | Discharge status: Home / Self Care | Payer: BLUE CROSS/BLUE SHIELD | Source: Ambulatory Visit | Attending: Cardiovascular Disease | Admitting: Cardiovascular Disease

## 2016-03-13 ENCOUNTER — Other Ambulatory Visit: Payer: Self-pay | Admitting: Family Medicine

## 2016-03-13 ENCOUNTER — Encounter (HOSPITAL_COMMUNITY)
Admission: RE | Disposition: A | Discharge status: Home / Self Care | Payer: Self-pay | Source: Ambulatory Visit | Attending: Cardiovascular Disease

## 2016-03-13 DIAGNOSIS — F17210 Nicotine dependence, cigarettes, uncomplicated: Secondary | ICD-10-CM | POA: Insufficient documentation

## 2016-03-13 DIAGNOSIS — Z955 Presence of coronary angioplasty implant and graft: Secondary | ICD-10-CM | POA: Insufficient documentation

## 2016-03-13 DIAGNOSIS — Z6824 Body mass index (BMI) 24.0-24.9, adult: Secondary | ICD-10-CM | POA: Insufficient documentation

## 2016-03-13 DIAGNOSIS — E1151 Type 2 diabetes mellitus with diabetic peripheral angiopathy without gangrene: Secondary | ICD-10-CM | POA: Insufficient documentation

## 2016-03-13 DIAGNOSIS — I739 Peripheral vascular disease, unspecified: Secondary | ICD-10-CM | POA: Diagnosis present

## 2016-03-13 DIAGNOSIS — E663 Overweight: Secondary | ICD-10-CM | POA: Insufficient documentation

## 2016-03-13 DIAGNOSIS — I7090 Unspecified atherosclerosis: Secondary | ICD-10-CM | POA: Diagnosis not present

## 2016-03-13 DIAGNOSIS — E785 Hyperlipidemia, unspecified: Secondary | ICD-10-CM | POA: Insufficient documentation

## 2016-03-13 DIAGNOSIS — I10 Essential (primary) hypertension: Secondary | ICD-10-CM | POA: Insufficient documentation

## 2016-03-13 DIAGNOSIS — I2510 Atherosclerotic heart disease of native coronary artery without angina pectoris: Secondary | ICD-10-CM | POA: Insufficient documentation

## 2016-03-13 DIAGNOSIS — I7092 Chronic total occlusion of artery of the extremities: Secondary | ICD-10-CM | POA: Insufficient documentation

## 2016-03-13 DIAGNOSIS — I70213 Atherosclerosis of native arteries of extremities with intermittent claudication, bilateral legs: Secondary | ICD-10-CM | POA: Insufficient documentation

## 2016-03-13 DIAGNOSIS — Z7982 Long term (current) use of aspirin: Secondary | ICD-10-CM | POA: Insufficient documentation

## 2016-03-13 DIAGNOSIS — Z794 Long term (current) use of insulin: Secondary | ICD-10-CM | POA: Insufficient documentation

## 2016-03-13 HISTORY — PX: LOWER EXTREMITY ANGIOGRAPHY: CATH118251

## 2016-03-13 LAB — GLUCOSE, CAPILLARY: Glucose-Capillary: 247 mg/dL — ABNORMAL HIGH (ref 65–99)

## 2016-03-13 SURGERY — LOWER EXTREMITY ANGIOGRAPHY
Anesthesia: LOCAL

## 2016-03-13 MED ORDER — ASPIRIN 81 MG PO CHEW: 81.0000 mg | CHEWABLE_TABLET | ORAL | Status: DC

## 2016-03-13 MED ORDER — HYDRALAZINE HCL 20 MG/ML IJ SOLN: 10.0000 mg | INTRAMUSCULAR | Status: DC | PRN

## 2016-03-13 MED ORDER — SODIUM CHLORIDE 0.9 % IV SOLN: INTRAVENOUS | Status: DC

## 2016-03-13 MED ORDER — LIDOCAINE HCL (PF) 1 % IJ SOLN
INTRAMUSCULAR | Status: AC
  Filled 2016-03-13: qty 30

## 2016-03-13 MED ORDER — MIDAZOLAM HCL 2 MG/2ML IJ SOLN
INTRAMUSCULAR | Status: AC
  Filled 2016-03-13: qty 2

## 2016-03-13 MED ORDER — ACETAMINOPHEN 325 MG PO TABS: 650.0000 mg | ORAL_TABLET | ORAL | Status: DC | PRN

## 2016-03-13 MED ORDER — HEPARIN (PORCINE) IN NACL 2-0.9 UNIT/ML-% IJ SOLN
INTRAMUSCULAR | Status: DC | PRN
  Administered 2016-03-13: 1000 mL

## 2016-03-13 MED ORDER — LIDOCAINE HCL (PF) 1 % IJ SOLN
INTRAMUSCULAR | Status: DC | PRN
  Administered 2016-03-13: 22 mL

## 2016-03-13 MED ORDER — FENTANYL CITRATE (PF) 100 MCG/2ML IJ SOLN
INTRAMUSCULAR | Status: DC | PRN
  Administered 2016-03-13: 25 ug via INTRAVENOUS

## 2016-03-13 MED ORDER — FENTANYL CITRATE (PF) 100 MCG/2ML IJ SOLN
INTRAMUSCULAR | Status: AC
  Filled 2016-03-13: qty 2

## 2016-03-13 MED ORDER — SODIUM CHLORIDE 0.9 % WEIGHT BASED INFUSION: 1.0000 mL/kg/h | INTRAVENOUS | Status: DC

## 2016-03-13 MED ORDER — SODIUM CHLORIDE 0.9 % WEIGHT BASED INFUSION
3.0000 mL/kg/h | INTRAVENOUS | Status: AC
  Administered 2016-03-13: 3 mL/kg/h via INTRAVENOUS

## 2016-03-13 MED ORDER — SODIUM CHLORIDE 0.9% FLUSH: 3.0000 mL | INTRAVENOUS | Status: DC | PRN

## 2016-03-13 MED ORDER — IODIXANOL 320 MG/ML IV SOLN
INTRAVENOUS | Status: DC | PRN
  Administered 2016-03-13: 140 mL via INTRA_ARTERIAL

## 2016-03-13 MED ORDER — HEPARIN SODIUM (PORCINE) 1000 UNIT/ML IJ SOLN
INTRAMUSCULAR | Status: AC
  Filled 2016-03-13: qty 1

## 2016-03-13 MED ORDER — ONDANSETRON HCL 4 MG/2ML IJ SOLN: 4.0000 mg | Freq: Four times a day (QID) | INTRAMUSCULAR | Status: DC | PRN

## 2016-03-13 MED ORDER — HEPARIN (PORCINE) IN NACL 2-0.9 UNIT/ML-% IJ SOLN
INTRAMUSCULAR | Status: AC
  Filled 2016-03-13: qty 1000

## 2016-03-13 MED ORDER — MIDAZOLAM HCL 2 MG/2ML IJ SOLN
INTRAMUSCULAR | Status: DC | PRN
  Administered 2016-03-13: 1 mg via INTRAVENOUS

## 2016-03-13 SURGICAL SUPPLY — 19 items
CATH ANGIO 5F PIGTAIL 65CM (CATHETERS) ×1 IMPLANT
CATH CROSS OVER TEMPO 5F (CATHETERS) ×1 IMPLANT
CATH SOFT-VU 4F 65 STRAIGHT (CATHETERS) IMPLANT
CATH SOFT-VU STRAIGHT 4F 65CM (CATHETERS) ×2
CATH SOFTOUCH MOTARJEME 5F (CATHETERS) ×1 IMPLANT
CATH STRAIGHT 5FR 65CM (CATHETERS) ×1 IMPLANT
CATH TEMPO 5F RIM 65CM (CATHETERS) ×1 IMPLANT
KIT PV (KITS) ×2 IMPLANT
SHEATH PINNACLE 5F 10CM (SHEATH) ×1 IMPLANT
SHEATH PINNACLE MP 7F 45CM (SHEATH) IMPLANT
STOPCOCK MORSE 400PSI 3WAY (MISCELLANEOUS) ×1 IMPLANT
SYRINGE MEDRAD AVANTA MACH 7 (SYRINGE) ×1 IMPLANT
TAPE RADIOPAQUE TURBO (MISCELLANEOUS) IMPLANT
TRANSDUCER W/STOPCOCK (MISCELLANEOUS) ×2 IMPLANT
TRAY PV CATH (CUSTOM PROCEDURE TRAY) ×2 IMPLANT
TUBING CIL FLEX 10 FLL-RA (TUBING) ×1 IMPLANT
WIRE HITORQ VERSACORE ST 145CM (WIRE) ×1 IMPLANT
WIRE J 3MM .035X145CM (WIRE) ×1 IMPLANT
WIRE ROSEN-J .035X260CM (WIRE) ×1 IMPLANT

## 2016-03-13 NOTE — Discharge Instructions (Signed)
Angiogram, Care After °These instructions give you information about caring for yourself after your procedure. Your doctor may also give you more specific instructions. Call your doctor if you have any problems or questions after your procedure. °Follow these instructions at home: °· Take medicines only as told by your doctor. °· Follow your doctor's instructions about: °¨ Care of the area where the tube was inserted. °¨ Bandage (dressing) changes and removal. °· You may shower 24-48 hours after the procedure or as told by your doctor. °· Do not take baths, swim, or use a hot tub until your doctor approves. °· Every day, check the area where the tube was inserted. Watch for: °¨ Redness, swelling, or pain. °¨ Fluid, blood, or pus. °· Do not apply powder or lotion to the site. °· Do not lift anything that is heavier than 10 lb (4.5 kg) for 5 days or as told by your doctor. °· Ask your doctor when you can: °¨ Return to work or school. °¨ Do physical activities or play sports. °¨ Have sex. °· Do not drive or operate heavy machinery for 24 hours or as told by your doctor. °· Have someone with you for the first 24 hours after the procedure. °· Keep all follow-up visits as told by your doctor. This is important. °Contact a health care provider if: °· You have a fever. °· You have chills. °· You have more bleeding from the area where the tube was inserted. Hold pressure on the area. °· You have redness, swelling, or pain in the area where the tube was inserted. °· You have fluid or pus coming from the area. °Get help right away if: °· You have a lot of pain in the area where the tube was inserted. °· The area where the tube was inserted is bleeding, and the bleeding does not stop after 30 minutes of holding steady pressure on the area. °· The area near or just beyond the insertion site becomes pale, cool, tingly, or numb. °This information is not intended to replace advice given to you by your health care provider. Make  sure you discuss any questions you have with your health care provider. °Document Released: 04/21/2008 Document Revised: 07/01/2015 Document Reviewed: 06/26/2012 °Elsevier Interactive Patient Education © 2017 [employer] Inc[employer] ° °

## 2016-03-13 NOTE — Interval H&P Note (Signed)
History and Physical Interval Note:  03/13/2016 9:55 AM  Rachel [name]  has presented today for surgery, with the diagnosis of pvd  The various methods of treatment have been discussed with the patient and family. After consideration of risks, benefits and other options for treatment, the patient has consented to  Procedure(s): Lower Extremity Angiography (N/A) as a surgical intervention .  The patient's history has been reviewed, patient examined, no change in status, stable for surgery.  I have reviewed the patient's chart and labs.  Questions were answered to the patient's satisfaction.     Quay Burow

## 2016-03-13 NOTE — H&P (View-Only) (Signed)
03/07/2016 Rachel [name]   [birth year]  [id number]  Primary Physician [name], MD Primary Cardiologist: [name] MD Renae Gloss  HPI: [name] is a delightful 66 year old mildly overweight divorced African-American female mother of one, grandmother of one grandchild who is referred by Dr. [name] for lifestyle limiting claudication. I last saw her in the office 11/06/14.Her primary care physician is Dr. [name]. She has a history of tobacco abuse smoking one pack per day for last 45 years, treated hypertension, diabetes and hyperlipidemia. She does have ischemic heart disease status post RCA intervention February 2010 with overlapping drug eluting stents in her RCA. She had repeat cath the following year revealing these to be widely patent. She complains of right lower extremity claudication claudication left greater than right over the last 2 years. 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 I reviewed this with Dr. Trula Slade who suggested that we attempt percutaneous vascular station before considering aortobifemoral bypass grafting. I angiogram on 10/15/14 and was able to cross her left common iliac chronic total occlusion. I stented both iliac arteries with I cast cover stents using "kissing stent technique". Her procedure was stopped. By a significant hematoma in the left side with a left common femoral pseudoaneurysm that was partially thrombosed initially and follow-up Doppler was completely thrombosed. Her hemoglobin stabilized not requiring transfusion. Her claudication has resolved although she still has some pain in her left hip from resolving hematoma. Her Doppler studies showed  post procedure normal ABIs bilaterally and her claudication resolved..  Since I saw her last she developed ischemic changes on her left heel. Her left ABI has dropped to 0.64. Her left femoral pulse is much weaker than her right. I suspect her left iliac stent is occluded. We will repeat lower extremity arterial Doppler studies in the next one to 2 days and I will arrange for her to undergo angiography at [facility] on February 5.   Current Outpatient Prescriptions  Medication Sig Dispense Refill  . aspirin EC 81 MG tablet Take 81 mg by mouth daily.      . Blood Glucose Monitoring Suppl w/Device KIT Please dispense Glucose monitor per patient's preference or insurance with testing supplies. Test twice daily. Dx E11.9 1 each PRN  . buPROPion (WELLBUTRIN SR) 150 MG 12 hr tablet Take 1 tablet (150 mg total) by mouth daily. 30 tablet 5  . etodolac (LODINE) 400 MG tablet Take 1 tablet (400 mg total) by mouth 2 (two) times daily. Take with food 28 tablet 0  . gabapentin (NEURONTIN) 100 MG capsule Take 300 mg by mouth at bedtime.  6  . hydrochlorothiazide (MICROZIDE) 12.5 MG capsule Take 1 capsule (12.5 mg total) by mouth daily. 90 capsule 1  . HYDROcodone-acetaminophen (NORCO/VICODIN) 5-325 MG tablet Take 1 tablet by mouth every 6 (six) hours as needed. Use sparingly 36 tablet 0  . insulin aspart (NOVOLOG) 100 UNIT/ML FlexPen Inject 6 units into skin at dinner. Take 10 units of glucose is greater than 175 15 mL 5  . insulin glargine (LANTUS) 100 UNIT/ML injection Inject 0.82 mLs (82 Units total) into the skin at bedtime. 10 mL 5  . levothyroxine (SYNTHROID, LEVOTHROID) 75 MCG tablet TAKE 1 TABLET  BY MOUTH DAILY BEFORE BREAKFAST 90 tablet 1  . meclizine (ANTIVERT) 25 MG tablet TAKE 1 TABLET (25 MG TOTAL) BY MOUTH EVERY 6 (SIX) HOURS AS NEEDED FOR DIZZINESS OR NAUSEA. 30 tablet 3  . metFORMIN (GLUCOPHAGE) 500 MG tablet Take 2 tablets by mouth twice a day. 120 tablet 5  . oxybutynin (DITROPAN-XL)  10 MG 24 hr tablet Take 2 tablets (20 mg total) by mouth daily. 180 tablet 1  . oxyCODONE-acetaminophen (PERCOCET) 5-325 MG tablet Take 1 tablet by mouth every 4 (four) hours as needed for severe pain. 50 tablet 0  . pantoprazole (PROTONIX) 40 MG tablet TAKE 1 TABLET BY MOUTH EVERY DAY 30 tablet 2  . potassium chloride SA (KLOR-CON M20) 20 MEQ tablet Take 1 tablet (20 mEq total) by mouth 2 (two) times daily. 60 tablet 5  . rosuvastatin (CRESTOR) 20 MG tablet Take 1 tablet (20 mg total) by mouth at bedtime. 90 tablet 1  . saccharomyces boulardii (FLORASTOR) 250 MG capsule Take 1 capsule (250 mg total) by mouth 2 (two) times daily. 20 capsule 0   No current facility-administered medications for this visit.     Allergies  Allergen Reactions  . Altace [Ramipril] Cough  . Biaxin [Clarithromycin]     Fatigue  . Niacin And Related Other (See Comments)    Flush - felt like on fire.  . Nsaids Other (See Comments)    feverish    Social History   Social History  . Marital status: Divorced    Spouse name: N/A  . Number of children: 1  . 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   Social History Main Topics  . Smoking status: Current Every Day Smoker    Packs/day: 0.50    Years: 47.00    Types: Cigarettes  . Smokeless tobacco: Never Used  . Alcohol use No  . Drug use: No  . Sexual activity: Yes    Partners: Male    Birth control/ protection: Condom     Comment: mutual friends   Other Topics Concern  . Not on file   Social History Narrative  . No narrative on file     Review of Systems: General: negative for chills, fever, night sweats or weight changes.  Cardiovascular: negative for chest pain, dyspnea on exertion, edema, orthopnea, palpitations, paroxysmal nocturnal dyspnea or shortness of breath Dermatological: negative for rash Respiratory: negative for cough or wheezing Urologic: negative for hematuria Abdominal: negative for  nausea, vomiting, diarrhea, bright red blood per rectum, melena, or hematemesis Neurologic: negative for visual changes, syncope, or dizziness All other systems reviewed and are otherwise negative except as noted above.    Blood pressure 126/76, pulse (!) 106, height 5' 3.5" (1.613 m), weight 156 lb 12.8 oz (71.1 kg).  General appearance: alert and no distress Neck: no adenopathy, no carotid bruit, no JVD, supple, symmetrical, trachea midline and thyroid not enlarged, symmetric, no tenderness/mass/nodules Lungs: clear to auscultation bilaterally Heart: regular rate and rhythm, S1, S2 normal, no murmur, click, rub or gallop Extremities: extremities normal, atraumatic, no cyanosis or edema and Decrease left common femoral pulse  EKG sinus tachycardia 106 without ST or T-wave changes. I personally reviewed this EKG  ASSESSMENT AND PLAN:   Hyperlipidemia LDL goal <70 History of hyperlipidemia on statin therapy with lipid profile performed 04/17/15 and LDL 110 ratio of 33.  TOBACCO ABUSE History of continued tobacco abuse of 5 or 6 cigarettes a day recalcitrant to risk  factor modification  Coronary atherosclerosis of native coronary artery History of coronary artery disease status post RCA intervention with overlapping drug-eluting stents February 2010. She denies chest pain or shortness of breath.  Peripheral arterial disease ([place]) History of peripheral arterial disease status post complex for arterial intervention by myself 09/28/2014 with three-vessel rotation of a left iliac CTO and right iliac stenosis using "kissing stent technique and covered stents. The procedure was,. By hematoma with a left common femoral pseudoaneurysm. Her claudication ultimately resolved. Her Dopplers performed 08/19/15 revealed widely patent stents with normal ABIs. She's had pain in her left foot recently and recent Dopplers performed yesterday revealed a right ABI 0.98 and left 0.64. She does have reddish  discoloration on the heel of her left foot which is painful suggesting ischemic changes. On going to repeat her Dopplers/lower arterial duplex arrange for undergo angiography on Monday via the right femoral approach.  Essential hypertension, benign History of hypertension blood pressure measured at 126/76. She is on hydrochlorothiazide. He current meds at current dosing      [name] MD [facility], [facility] 03/07/2016 4:00 PM

## 2016-03-14 ENCOUNTER — Telehealth: Payer: Self-pay | Admitting: Cardiovascular Disease

## 2016-03-14 ENCOUNTER — Encounter (HOSPITAL_COMMUNITY): Payer: Self-pay | Admitting: Cardiovascular Disease

## 2016-03-14 ENCOUNTER — Other Ambulatory Visit: Payer: Self-pay | Admitting: Cardiovascular Disease

## 2016-03-14 DIAGNOSIS — I6529 Occlusion and stenosis of unspecified carotid artery: Secondary | ICD-10-CM

## 2016-03-14 NOTE — Telephone Encounter (Signed)
Pt said she had a procedure yesterday.Pt said she thought she was told that she would have to do it again.

## 2016-03-17 ENCOUNTER — Inpatient Hospital Stay (HOSPITAL_COMMUNITY): Admission: RE | Admit: 2016-03-17 | Payer: BLUE CROSS/BLUE SHIELD | Source: Ambulatory Visit

## 2016-03-17 ENCOUNTER — Ambulatory Visit: Payer: BLUE CROSS/BLUE SHIELD | Admitting: Cardiovascular Disease

## 2016-03-21 ENCOUNTER — Encounter: Payer: Self-pay | Admitting: Cardiovascular Disease

## 2016-03-21 ENCOUNTER — Ambulatory Visit (INDEPENDENT_AMBULATORY_CARE_PROVIDER_SITE_OTHER): Payer: BLUE CROSS/BLUE SHIELD | Admitting: Cardiovascular Disease

## 2016-03-21 VITALS — BP 139/91 | HR 103 | Ht 63.0 in | Wt 153.6 lb

## 2016-03-21 DIAGNOSIS — I739 Peripheral vascular disease, unspecified: Secondary | ICD-10-CM | POA: Diagnosis not present

## 2016-03-21 DIAGNOSIS — Z01812 Encounter for preprocedural laboratory examination: Secondary | ICD-10-CM

## 2016-03-21 LAB — CBC WITH DIFFERENTIAL/PLATELET
Basophils Absolute: 80 cells/uL (ref 0–200)
Basophils Relative: 1 %
EOS PCT: 3 %
Eosinophils Absolute: 240 cells/uL (ref 15–500)
HEMATOCRIT: 38.7 % (ref 35.0–45.0)
Hemoglobin: 12.8 g/dL (ref 11.7–15.5)
LYMPHS PCT: 37 %
Lymphs Abs: 2960 cells/uL (ref 850–3900)
MCH: 28.6 pg (ref 27.0–33.0)
MCHC: 33.1 g/dL (ref 32.0–36.0)
MCV: 86.6 fL (ref 80.0–100.0)
MONOS PCT: 5 %
MPV: 9.8 fL (ref 7.5–12.5)
Monocytes Absolute: 400 cells/uL (ref 200–950)
NEUTROS PCT: 54 %
Neutro Abs: 4320 cells/uL (ref 1500–7800)
PLATELETS: 319 10*3/uL (ref 140–400)
RBC: 4.47 MIL/uL (ref 3.80–5.10)
RDW: 15.6 % — AB (ref 11.0–15.0)
WBC: 8 10*3/uL (ref 3.8–10.8)

## 2016-03-21 LAB — BASIC METABOLIC PANEL WITH GFR
BUN: 14 mg/dL (ref 7–25)
CALCIUM: 9.4 mg/dL (ref 8.6–10.4)
CO2: 25 mmol/L (ref 20–31)
CREATININE: 1.05 mg/dL — AB (ref 0.50–0.99)
Chloride: 100 mmol/L (ref 98–110)
GFR, EST AFRICAN AMERICAN: 65 mL/min (ref >=60)
GFR, Est Non African American: 57 mL/min — ABNORMAL LOW (ref >=60)
Glucose, Bld: 215 mg/dL — ABNORMAL HIGH (ref 65–99)
Potassium: 4 mmol/L (ref 3.5–5.3)
Sodium: 135 mmol/L (ref 135–146)

## 2016-03-21 LAB — PROTIME-INR
INR: 1
PROTHROMBIN TIME: 10.7 s (ref 9.0–11.5)

## 2016-03-21 LAB — APTT: aPTT: 25 s (ref 22–34)

## 2016-03-21 NOTE — Patient Instructions (Signed)
Dr. Gwenlyn Found has ordered a peripheral angiogram on 03/27/16 with Dr. Gwenlyn Found to be done at [facility].  This procedure is going to look at the bloodflow in your lower extremities.  If Dr. Gwenlyn Found is able to open up the arteries, you will have to spend one night in the hospital.  If he is not able to open the arteries, you will be able to go home that same day.    After the procedure, you will not be allowed to drive for 3 days or push, pull, or lift anything greater than 10 lbs for one week.    You will be required to have the following tests prior to the procedure:  1. Blood work-the blood work can be done no more than 14 days prior to the procedure.  It can be done at any [facility] lab.  There is one downstairs on the first floor of this building and one in the [facility] building [phone] [address], [address])   *REP--ERIC, Diamondback (Lt. Groin antegrade stick)  Puncture site: Lt. Groin

## 2016-03-21 NOTE — Progress Notes (Signed)
03/21/2016 Rachel [name]   01/04/1951  [id number]  Primary Physician [name], MD Primary Cardiologist: [name] MD Renae Gloss  HPI:  Rachel [name] is a delightful 66 year old mildly overweight divorced African-American female mother of one, grandmother of one grandchild who is referred by Dr. [name] for lifestyle limiting claudication. I last saw her in the office 03/07/16.Her primary care physician is Dr. [name]. She has a history of tobacco abuse smoking one pack per day for last 45 years, treated hypertension, diabetes and hyperlipidemia. She does have ischemic heart disease status post RCA intervention February 2010 with overlapping drug eluting stents in her RCA. She had repeat cath the following year revealing these to be widely patent. She complains of right lower extremity claudication claudication left greater than right over the last 2 years. 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 I reviewed this with Dr. Trula Slade who suggested that we attempt percutaneous vascular station before considering aortobifemoral bypass grafting. I angiogram on 10/15/14 and was able to cross her left common iliac chronic total occlusion. I stented both iliac arteries with I cast cover stents using "kissing stent technique". Her procedure was stopped. By a significant hematoma in the left side with a left common femoral pseudoaneurysm that was partially thrombosed initially and follow-up Doppler was completely thrombosed. Her hemoglobin stabilized not requiring transfusion. Her claudication has resolved although she still has some pain in her left hip from resolving hematoma. Her Doppler studies showed  post procedure normal ABIs bilaterally and her claudication resolved..  Since I saw her last she developed ischemic changes on her left heel. Her left ABI has dropped to 0.64. Her left femoral pulse is much weaker than her right. I suspect her left iliac stent is occluded. She underwent repeat angiography by myself 03/13/16 revealing patent iliac stents, one vessel runoff bilaterally, 50's history percent calcified segmental left common femoral artery stenosis that did not appear to be hemodynamically significant. There was a 90% calcified mid left SFA stenosis. I attempted to cross her iliac bifurcation unsuccessfully. She will be brought back for staged antegrade stick of her left common femoral artery hematoma diamond orbital rotation atherectomy of her mid left SFA using NAV 6 distal protection giving her one vessel runoff.   Current Outpatient Prescriptions  Medication Sig Dispense Refill  . aspirin EC 81 MG tablet Take 81 mg by mouth daily.      Marland Kitchen gabapentin (NEURONTIN) 100 MG capsule Take 300 mg by mouth at bedtime.  6  . hydrochlorothiazide (MICROZIDE) 12.5 MG capsule Take 1 capsule (12.5 mg total) by mouth daily. 90 capsule 1  . HYDROcodone-acetaminophen (NORCO/VICODIN) 5-325 MG tablet Take 1 tablet by mouth every 6 (six) hours as needed. Use sparingly (Patient taking differently: Take 1 tablet by mouth every 6 (six) hours as needed for moderate pain. Use sparingly) 36 tablet 0  . insulin aspart (NOVOLOG) 100 UNIT/ML FlexPen Inject 6 units into skin at dinner. Take 10 units of glucose is greater than 175 (Patient taking differently: Inject 4-10 Units into the skin 2 (two) times daily with a meal. ) 15 mL 5  . insulin glargine (LANTUS) 100 UNIT/ML injection Inject 0.82 mLs (82 Units total) into the skin  at bedtime. (Patient taking differently: Inject 84 Units into the skin at bedtime. ) 10 mL 5  . levothyroxine (SYNTHROID, LEVOTHROID) 75 MCG tablet TAKE 1 TABLET BY MOUTH DAILY BEFORE BREAKFAST  (Patient taking differently: Take 75 mcg by mouth daily before breakfast. ) 90 tablet 1  . metFORMIN (GLUCOPHAGE) 500 MG tablet TAKE 1 TABLET BY MOUTH TWICE A DAY. 180 tablet 0  . oxybutynin (DITROPAN-XL) 10 MG 24 hr tablet Take 2 tablets (20 mg total) by mouth daily. 180 tablet 1  . pantoprazole (PROTONIX) 40 MG tablet TAKE 1 TABLET BY MOUTH EVERY DAY (Patient taking differently: TAKE 1 TABLET(40MG ) BY MOUTH EVERY DAY) 30 tablet 2  . potassium chloride SA (KLOR-CON M20) 20 MEQ tablet Take 1 tablet (20 mEq total) by mouth 2 (two) times daily. 60 tablet 5  . rosuvastatin (CRESTOR) 20 MG tablet Take 1 tablet (20 mg total) by mouth at bedtime. 90 tablet 1   No current facility-administered medications for this visit.     Allergies  Allergen Reactions  . Altace [Ramipril] Cough  . Biaxin [Clarithromycin]     Fatigue  . Niacin And Related Other (See Comments)    Flush - felt like on fire.  . Nsaids Other (See Comments)    feverish    Social History   Social History  . Marital status: Divorced    Spouse name: N/A  . Number of children: 1  . 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   Social History Main Topics  . Smoking status: Current Every Day Smoker    Packs/day: 0.50    Years: 47.00    Types: Cigarettes  . Smokeless tobacco: Never Used  . Alcohol use No  . Drug use: No  . Sexual activity: Yes    Partners: Male    Birth control/ protection: Condom     Comment: mutual friends   Other Topics Concern  . Not on file   Social History Narrative  . No narrative on file     Review of Systems: General: negative for chills, fever, night sweats or weight changes.  Cardiovascular: negative for chest pain, dyspnea on exertion, edema, orthopnea, palpitations, paroxysmal nocturnal dyspnea or shortness of breath Dermatological: negative for rash Respiratory: negative for cough or wheezing Urologic: negative for hematuria Abdominal: negative  for nausea, vomiting, diarrhea, bright red blood per rectum, melena, or hematemesis Neurologic: negative for visual changes, syncope, or dizziness All other systems reviewed and are otherwise negative except as noted above.    Blood pressure (!) 139/91, pulse (!) 103, height 5\' 3"  (1.6 m), weight 153 lb 9.6 oz (69.7 kg), SpO2 99 %.  General appearance: alert and no distress Neck: no adenopathy, no carotid bruit, no JVD, supple, symmetrical, trachea midline and thyroid not enlarged, symmetric, no tenderness/mass/nodules Lungs: clear to auscultation bilaterally Heart: regular rate and rhythm, S1, S2 normal, no murmur, click, rub or gallop Extremities: extremities normal, atraumatic, no cyanosis or edema  EKG not performed today  ASSESSMENT AND PLAN:   Claudication [facility]) Ms. Eklof has had her right and left iliac revascularized by myself 10/15/14. Because of a drop in her left ABI down to 0.64 with resting pain and discoloration I angiogram turgor 03/13/16 revealing patent iliac stents, moderate segmental calcified disease in her left common femoral artery. She had high-grade 95-99% calcified focal mid left SFA stenosis with 1 vessel runoff via an anterior tibial artery. I attempted to cross her iliac bifurcation unsuccessfully  because of the acuteness of the angle and prior stents. She will need to be brought back for a low left common femoral antegrade stick with diamondback orbital rotational atherectomy, PTA and using drug-eluting balloon angioplasty and distal protection.      [name] MD FACP,FACC,FAHA, [facility] 03/21/2016 11:35 AM

## 2016-03-21 NOTE — Assessment & Plan Note (Signed)
Rachel [name] has had her right and left iliac revascularized by myself 10/15/14. Because of a drop in her left ABI down to 0.64 with resting pain and discoloration I angiogram turgor 03/13/16 revealing patent iliac stents, moderate segmental calcified disease in her left common femoral artery. She had high-grade 95-99% calcified focal mid left SFA stenosis with 1 vessel runoff via an anterior tibial artery. I attempted to cross her iliac bifurcation unsuccessfully because of the acuteness of the angle and prior stents. She will need to be brought back for a low left common femoral antegrade stick with diamondback orbital rotational atherectomy, PTA and using drug-eluting balloon angioplasty and distal protection.

## 2016-03-24 NOTE — Addendum Note (Signed)
Addended by: Dairl Ponder on: 03/24/2016 09:02 AM   Modules accepted: Orders

## 2016-03-27 ENCOUNTER — Encounter (HOSPITAL_COMMUNITY): Payer: Self-pay | Admitting: Cardiovascular Disease

## 2016-03-27 ENCOUNTER — Encounter (HOSPITAL_COMMUNITY)
Admission: RE | Disposition: A | Discharge status: Home / Self Care | Payer: Self-pay | Source: Ambulatory Visit | Attending: Cardiovascular Disease

## 2016-03-27 ENCOUNTER — Ambulatory Visit (HOSPITAL_COMMUNITY)
Admission: RE | Admit: 2016-03-27 | Discharge: 2016-03-27 | Disposition: A | Discharge status: Home / Self Care | Payer: MEDICAID | Source: Ambulatory Visit | Attending: Cardiovascular Disease | Admitting: Cardiovascular Disease

## 2016-03-27 DIAGNOSIS — F17210 Nicotine dependence, cigarettes, uncomplicated: Secondary | ICD-10-CM | POA: Insufficient documentation

## 2016-03-27 DIAGNOSIS — Z95820 Peripheral vascular angioplasty status with implants and grafts: Secondary | ICD-10-CM | POA: Insufficient documentation

## 2016-03-27 DIAGNOSIS — E785 Hyperlipidemia, unspecified: Secondary | ICD-10-CM | POA: Insufficient documentation

## 2016-03-27 DIAGNOSIS — I10 Essential (primary) hypertension: Secondary | ICD-10-CM | POA: Insufficient documentation

## 2016-03-27 DIAGNOSIS — I739 Peripheral vascular disease, unspecified: Secondary | ICD-10-CM | POA: Diagnosis present

## 2016-03-27 DIAGNOSIS — I70213 Atherosclerosis of native arteries of extremities with intermittent claudication, bilateral legs: Secondary | ICD-10-CM | POA: Insufficient documentation

## 2016-03-27 DIAGNOSIS — I724 Aneurysm of artery of lower extremity: Secondary | ICD-10-CM | POA: Insufficient documentation

## 2016-03-27 DIAGNOSIS — E1151 Type 2 diabetes mellitus with diabetic peripheral angiopathy without gangrene: Secondary | ICD-10-CM | POA: Insufficient documentation

## 2016-03-27 DIAGNOSIS — Z7982 Long term (current) use of aspirin: Secondary | ICD-10-CM | POA: Insufficient documentation

## 2016-03-27 DIAGNOSIS — Z794 Long term (current) use of insulin: Secondary | ICD-10-CM | POA: Insufficient documentation

## 2016-03-27 DIAGNOSIS — I7092 Chronic total occlusion of artery of the extremities: Secondary | ICD-10-CM | POA: Insufficient documentation

## 2016-03-27 HISTORY — PX: PERIPHERAL VASCULAR ATHERECTOMY: CATH118256

## 2016-03-27 LAB — GLUCOSE, CAPILLARY
Glucose-Capillary: 221 mg/dL — ABNORMAL HIGH (ref 65–99)
Glucose-Capillary: 150 mg/dL — ABNORMAL HIGH (ref 65–99)

## 2016-03-27 SURGERY — PERIPHERAL VASCULAR ATHERECTOMY

## 2016-03-27 MED ORDER — SODIUM CHLORIDE 0.9% FLUSH: 3.0000 mL | INTRAVENOUS | Status: DC | PRN

## 2016-03-27 MED ORDER — SODIUM CHLORIDE 0.9 % WEIGHT BASED INFUSION: 1.0000 mL/kg/h | INTRAVENOUS | Status: DC

## 2016-03-27 MED ORDER — HEPARIN (PORCINE) IN NACL 2-0.9 UNIT/ML-% IJ SOLN
INTRAMUSCULAR | Status: DC | PRN
  Administered 2016-03-27: 1000 mL via INTRA_ARTERIAL

## 2016-03-27 MED ORDER — HEPARIN (PORCINE) IN NACL 2-0.9 UNIT/ML-% IJ SOLN
INTRAMUSCULAR | Status: AC
  Filled 2016-03-27: qty 1000

## 2016-03-27 MED ORDER — MIDAZOLAM HCL 2 MG/2ML IJ SOLN
INTRAMUSCULAR | Status: DC | PRN
  Administered 2016-03-27: 1 mg via INTRAVENOUS

## 2016-03-27 MED ORDER — SODIUM CHLORIDE 0.9 % IV SOLN: 250.0000 mL | INTRAVENOUS | Status: DC | PRN

## 2016-03-27 MED ORDER — SODIUM CHLORIDE 0.9 % WEIGHT BASED INFUSION
3.0000 mL/kg/h | INTRAVENOUS | Status: AC
  Administered 2016-03-27: 3 mL/kg/h via INTRAVENOUS

## 2016-03-27 MED ORDER — ASPIRIN 81 MG PO CHEW
CHEWABLE_TABLET | ORAL | Status: AC
  Filled 2016-03-27: qty 1

## 2016-03-27 MED ORDER — VERAPAMIL HCL 2.5 MG/ML IV SOLN
INTRAVENOUS | Status: AC
  Filled 2016-03-27: qty 2

## 2016-03-27 MED ORDER — HEPARIN SODIUM (PORCINE) 1000 UNIT/ML IJ SOLN
INTRAMUSCULAR | Status: AC
  Filled 2016-03-27: qty 1

## 2016-03-27 MED ORDER — LIDOCAINE HCL (PF) 1 % IJ SOLN
INTRAMUSCULAR | Status: DC | PRN
  Administered 2016-03-27: 22 mL

## 2016-03-27 MED ORDER — FENTANYL CITRATE (PF) 100 MCG/2ML IJ SOLN
INTRAMUSCULAR | Status: DC | PRN
  Administered 2016-03-27: 25 ug via INTRAVENOUS

## 2016-03-27 MED ORDER — FENTANYL CITRATE (PF) 100 MCG/2ML IJ SOLN
INTRAMUSCULAR | Status: AC
  Filled 2016-03-27: qty 2

## 2016-03-27 MED ORDER — LIDOCAINE HCL (PF) 1 % IJ SOLN
INTRAMUSCULAR | Status: AC
  Filled 2016-03-27: qty 30

## 2016-03-27 MED ORDER — SODIUM CHLORIDE 0.9% FLUSH: 3.0000 mL | Freq: Two times a day (BID) | INTRAVENOUS | Status: DC

## 2016-03-27 MED ORDER — ONDANSETRON HCL 4 MG/2ML IJ SOLN: 4.0000 mg | Freq: Four times a day (QID) | INTRAMUSCULAR | Status: DC | PRN

## 2016-03-27 MED ORDER — ACETAMINOPHEN 325 MG PO TABS: 650.0000 mg | ORAL_TABLET | ORAL | Status: DC | PRN

## 2016-03-27 MED ORDER — MIDAZOLAM HCL 2 MG/2ML IJ SOLN
INTRAMUSCULAR | Status: AC
  Filled 2016-03-27: qty 2

## 2016-03-27 MED ORDER — ASPIRIN 81 MG PO CHEW
81.0000 mg | CHEWABLE_TABLET | ORAL | Status: AC
  Administered 2016-03-27: 81 mg via ORAL

## 2016-03-27 MED ORDER — NITROGLYCERIN IN D5W 200-5 MCG/ML-% IV SOLN
INTRAVENOUS | Status: AC
  Filled 2016-03-27: qty 250

## 2016-03-27 SURGICAL SUPPLY — 11 items
KIT PV (KITS) ×3 IMPLANT
SHEATH PINNACLE 5F 10CM (SHEATH) ×2 IMPLANT
SHEATH PINNACLE ST 6F 45CM (SHEATH) ×2 IMPLANT
STOPCOCK MORSE 400PSI 3WAY (MISCELLANEOUS) ×2 IMPLANT
SYR MEDRAD MARK V 150ML (SYRINGE) ×3 IMPLANT
TRANSDUCER W/STOPCOCK (MISCELLANEOUS) ×3 IMPLANT
TRAY PV CATH (CUSTOM PROCEDURE TRAY) ×3 IMPLANT
TUBING CIL FLEX 10 FLL-RA (TUBING) ×2 IMPLANT
WIRE HITORQ VERSACORE ST 145CM (WIRE) ×2 IMPLANT
WIRE MINI STICK MAX (SHEATH) ×2 IMPLANT
WIRE ROSEN-J .035X180CM (WIRE) ×2 IMPLANT

## 2016-03-27 NOTE — H&P (View-Only) (Signed)
03/21/2016 HEBA ARUTYUNYAN   [birth year]  [id number]  Primary Physician [name], MD Primary Cardiologist: [name] MD Renae Gloss  HPI:  Mrs. Delcarlo is a delightful 66 year old mildly overweight divorced African-American female mother of one, grandmother of one grandchild who is referred by Dr. [name] for lifestyle limiting claudication. I last saw her in the office 03/07/16.Her primary care physician is Dr. [name]. She has a history of tobacco abuse smoking one pack per day for last 45 years, treated hypertension, diabetes and hyperlipidemia. She does have ischemic heart disease status post RCA intervention February 2010 with overlapping drug eluting stents in her RCA. She had repeat cath the following year revealing these to be widely patent. She complains of right lower extremity claudication claudication left greater than right over the last 2 years. 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 I reviewed this with Dr. Trula Slade who suggested that we attempt percutaneous vascular station before considering aortobifemoral bypass grafting. I angiogram on 10/15/14 and was able to cross her left common iliac chronic total occlusion. I stented both iliac arteries with I cast cover stents using "kissing stent technique". Her procedure was stopped. By a significant hematoma in the left side with a left common femoral pseudoaneurysm that was partially thrombosed initially and follow-up Doppler was completely thrombosed. Her hemoglobin stabilized not requiring transfusion. Her claudication has resolved although she still has some pain in her left hip from resolving hematoma. Her Doppler studies showed  post procedure normal ABIs bilaterally and her claudication resolved..  Since I saw her last she developed ischemic changes on her left heel. Her left ABI has dropped to 0.64. Her left femoral pulse is much weaker than her right. I suspect her left iliac stent is occluded. She underwent repeat angiography by myself 03/13/16 revealing patent iliac stents, one vessel runoff bilaterally, 50's history percent calcified segmental left common femoral artery stenosis that did not appear to be hemodynamically significant. There was a 90% calcified mid left SFA stenosis. I attempted to cross her iliac bifurcation unsuccessfully. She will be brought back for staged antegrade stick of her left common femoral artery hematoma diamond orbital rotation atherectomy of her mid left SFA using NAV 6 distal protection giving her one vessel runoff.   Current Outpatient Prescriptions  Medication Sig Dispense Refill  . aspirin EC 81 MG tablet Take 81 mg by mouth daily.      Marland Kitchen gabapentin (NEURONTIN) 100 MG capsule Take 300 mg by mouth at bedtime.  6  . hydrochlorothiazide (MICROZIDE) 12.5 MG capsule Take 1 capsule (12.5 mg total) by mouth daily. 90 capsule 1  . HYDROcodone-acetaminophen (NORCO/VICODIN) 5-325 MG tablet Take 1 tablet by mouth every 6 (six) hours as needed. Use sparingly (Patient taking differently: Take 1 tablet by mouth every 6 (six) hours as needed for moderate pain. Use sparingly) 36 tablet 0  . insulin aspart (NOVOLOG) 100 UNIT/ML FlexPen Inject 6 units into skin at dinner. Take 10 units of glucose is greater than 175 (Patient taking differently: Inject 4-10 Units into the skin 2 (two) times daily with a meal. ) 15 mL 5  . insulin glargine (LANTUS) 100 UNIT/ML injection Inject 0.82 mLs (82 Units total) into the skin  at bedtime. (Patient taking differently: Inject 84 Units into the skin at bedtime. ) 10 mL 5  . levothyroxine (SYNTHROID, LEVOTHROID) 75 MCG tablet TAKE 1 TABLET BY MOUTH DAILY BEFORE BREAKFAST  (Patient taking differently: Take 75 mcg by mouth daily before breakfast. ) 90 tablet 1  . metFORMIN (GLUCOPHAGE) 500 MG tablet TAKE 1 TABLET BY MOUTH TWICE A DAY. 180 tablet 0  . oxybutynin (DITROPAN-XL) 10 MG 24 hr tablet Take 2 tablets (20 mg total) by mouth daily. 180 tablet 1  . pantoprazole (PROTONIX) 40 MG tablet TAKE 1 TABLET BY MOUTH EVERY DAY (Patient taking differently: TAKE 1 TABLET(40MG ) BY MOUTH EVERY DAY) 30 tablet 2  . potassium chloride SA (KLOR-CON M20) 20 MEQ tablet Take 1 tablet (20 mEq total) by mouth 2 (two) times daily. 60 tablet 5  . rosuvastatin (CRESTOR) 20 MG tablet Take 1 tablet (20 mg total) by mouth at bedtime. 90 tablet 1   No current facility-administered medications for this visit.     Allergies  Allergen Reactions  . Altace [Ramipril] Cough  . Biaxin [Clarithromycin]     Fatigue  . Niacin And Related Other (See Comments)    Flush - felt like on fire.  . Nsaids Other (See Comments)    feverish    Social History   Social History  . Marital status: Divorced    Spouse name: N/A  . Number of children: 1  . 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   Social History Main Topics  . Smoking status: Current Every Day Smoker    Packs/day: 0.50    Years: 47.00    Types: Cigarettes  . Smokeless tobacco: Never Used  . Alcohol use No  . Drug use: No  . Sexual activity: Yes    Partners: Male    Birth control/ protection: Condom     Comment: mutual friends   Other Topics Concern  . Not on file   Social History Narrative  . No narrative on file     Review of Systems: General: negative for chills, fever, night sweats or weight changes.  Cardiovascular: negative for chest pain, dyspnea on exertion, edema, orthopnea, palpitations, paroxysmal nocturnal dyspnea or shortness of breath Dermatological: negative for rash Respiratory: negative for cough or wheezing Urologic: negative for hematuria Abdominal: negative  for nausea, vomiting, diarrhea, bright red blood per rectum, melena, or hematemesis Neurologic: negative for visual changes, syncope, or dizziness All other systems reviewed and are otherwise negative except as noted above.    Blood pressure (!) 139/91, pulse (!) 103, height 5\' 3"  (1.6 m), weight 153 lb 9.6 oz (69.7 kg), SpO2 99 %.  General appearance: alert and no distress Neck: no adenopathy, no carotid bruit, no JVD, supple, symmetrical, trachea midline and thyroid not enlarged, symmetric, no tenderness/mass/nodules Lungs: clear to auscultation bilaterally Heart: regular rate and rhythm, S1, S2 normal, no murmur, click, rub or gallop Extremities: extremities normal, atraumatic, no cyanosis or edema  EKG not performed today  ASSESSMENT AND PLAN:   Claudication [facility]) Ms. Besson has had her right and left iliac revascularized by myself 10/15/14. Because of a drop in her left ABI down to 0.64 with resting pain and discoloration I angiogram turgor 03/13/16 revealing patent iliac stents, moderate segmental calcified disease in her left common femoral artery. She had high-grade 95-99% calcified focal mid left SFA stenosis with 1 vessel runoff via an anterior tibial artery. I attempted to cross her iliac bifurcation unsuccessfully  because of the acuteness of the angle and prior stents. She will need to be brought back for a low left common femoral antegrade stick with diamondback orbital rotational atherectomy, PTA and using drug-eluting balloon angioplasty and distal protection.      [name] MD FACP,FACC,FAHA, [facility] 03/21/2016 11:35 AM

## 2016-03-27 NOTE — Discharge Instructions (Signed)
NO METFORMIN FOR 2 DAYS   Femoral Site Care Introduction Refer to this sheet in the next few weeks. These instructions provide you with information about caring for yourself after your procedure. Your health care provider may also give you more specific instructions. Your treatment has been planned according to current medical practices, but problems sometimes occur. Call your health care provider if you have any problems or questions after your procedure. What can I expect after the procedure? After your procedure, it is typical to have the following:  Bruising at the site that usually fades within 1-2 weeks.  Blood collecting in the tissue (hematoma) that may be painful to the touch. It should usually decrease in size and tenderness within 1-2 weeks. Follow these instructions at home:  Take medicines only as directed by your health care provider.  You may shower 24-48 hours after the procedure or as directed by your health care provider. Remove the bandage (dressing) and gently wash the site with plain soap and water. Pat the area dry with a clean towel. Do not rub the site, because this may cause bleeding.  Do not take baths, swim, or use a hot tub until your health care provider approves.  Check your insertion site every day for redness, swelling, or drainage.  Do not apply powder or lotion to the site.  Limit use of stairs to twice a day for the first 2-3 days or as directed by your health care provider.  Do not squat for the first 2-3 days or as directed by your health care provider.  Do not lift over 10 lb (4.5 kg) for 5 days after your procedure or as directed by your health care provider.  Ask your health care provider when it is okay to:  Return to work or school.  Resume usual physical activities or sports.  Resume sexual activity.  Do not drive home if you are discharged the same day as the procedure. Have someone else drive you.  You may drive 24 hours after the  procedure unless otherwise instructed by your health care provider.  Do not operate machinery or power tools for 24 hours after the procedure or as directed by your health care provider.  If your procedure was done as an outpatient procedure, which means that you went home the same day as your procedure, a responsible adult should be with you for the first 24 hours after you arrive home.  Keep all follow-up visits as directed by your health care provider. This is important. Contact a health care provider if:  You have a fever.  You have chills.  You have increased bleeding from the site. Hold pressure on the site. Get help right away if:  You have unusual pain at the site.  You have redness, warmth, or swelling at the site.  You have drainage (other than a small amount of blood on the dressing) from the site.  The site is bleeding, and the bleeding does not stop after 30 minutes of holding steady pressure on the site.  Your leg or foot becomes pale, cool, tingly, or numb. This information is not intended to replace advice given to you by your health care provider. Make sure you discuss any questions you have with your health care provider. Document Released: 09/26/2013 Document Revised: 07/01/2015 Document Reviewed: 08/12/2013  2017 [employer]

## 2016-03-27 NOTE — Interval H&P Note (Signed)
History and Physical Interval Note:  03/27/2016 7:39 AM  Rachel [name]  has presented today for surgery, with the diagnosis of pvd  The various methods of treatment have been discussed with the patient and family. After consideration of risks, benefits and other options for treatment, the patient has consented to  Procedure(s): Lower Extremity Intervention (N/A) as a surgical intervention .  The patient's history has been reviewed, patient examined, no change in status, stable for surgery.  I have reviewed the patient's chart and labs.  Questions were answered to the patient's satisfaction.     Quay Burow

## 2016-03-29 ENCOUNTER — Telehealth: Payer: Self-pay | Admitting: Cardiovascular Disease

## 2016-03-29 NOTE — Telephone Encounter (Signed)
New Message  Pt call requesting to speak with RN. Pt states she speak with Dr. Gwenlyn Found about getting a prescription for Plavix. Pt states she would like to know if Dr. Gwenlyn Found can write the script for the medication.  Pharmacy [facility] in [place], [place]. Please call back to discuss

## 2016-03-29 NOTE — Telephone Encounter (Signed)
Returned call to patient-patient states Dr. Gwenlyn Found was going to prescribe Plavix for her but she does not have a prescription.    Chart review patient had  Peripheral anhiography 2/5 and peripheral vascular artherectomy on 2/19 by Dr. Gwenlyn Found.   -unable to locate mention of Plavix-not on current med list.    Routed to MD for clarification.  Pt aware and verbalized understanding.

## 2016-03-30 ENCOUNTER — Encounter: Payer: Self-pay | Admitting: "Endocrinology

## 2016-03-30 ENCOUNTER — Ambulatory Visit (INDEPENDENT_AMBULATORY_CARE_PROVIDER_SITE_OTHER): Payer: BLUE CROSS/BLUE SHIELD | Admitting: "Endocrinology

## 2016-03-30 VITALS — BP 123/79 | HR 95 | Ht 63.0 in | Wt 148.0 lb

## 2016-03-30 DIAGNOSIS — E039 Hypothyroidism, unspecified: Secondary | ICD-10-CM | POA: Diagnosis not present

## 2016-03-30 DIAGNOSIS — I10 Essential (primary) hypertension: Secondary | ICD-10-CM | POA: Diagnosis not present

## 2016-03-30 DIAGNOSIS — E782 Mixed hyperlipidemia: Secondary | ICD-10-CM | POA: Diagnosis not present

## 2016-03-30 DIAGNOSIS — Z9119 Patient's noncompliance with other medical treatment and regimen: Secondary | ICD-10-CM

## 2016-03-30 DIAGNOSIS — Z91199 Patient's noncompliance with other medical treatment and regimen due to unspecified reason: Secondary | ICD-10-CM | POA: Insufficient documentation

## 2016-03-30 DIAGNOSIS — E1159 Type 2 diabetes mellitus with other circulatory complications: Secondary | ICD-10-CM | POA: Diagnosis not present

## 2016-03-30 MED ORDER — INSULIN ASPART 100 UNIT/ML FLEXPEN: 10.0000 [IU] | PEN_INJECTOR | Freq: Three times a day (TID) | SUBCUTANEOUS | 2 refills | Status: DC

## 2016-03-30 MED ORDER — INSULIN GLARGINE 100 UNIT/ML ~~LOC~~ SOLN: 40.0000 [IU] | Freq: Every day | SUBCUTANEOUS | 2 refills | Status: DC

## 2016-03-30 NOTE — Progress Notes (Signed)
Subjective:    Patient ID: Rachel [name], female    DOB: 1950-12-11, PCP [name], MD   Past Medical History:  Diagnosis Date  . Asthma   . Coronary atherosclerosis of native coronary artery    DES x 2 (overlapping) RCA 03/2008 - patent 2011  . Diabetes type 2, controlled ([place]) 2000  . Essential hypertension, benign   . Fatty liver   . GERD (gastroesophageal reflux disease)   . Hyperlipidemia   . Microalbuminuria   . Obesity   . Odynophagia    Severe esophagitis on CT 05/2010, treated empirically with Diflucan  . PAD (peripheral artery disease) (HCC)    history of left greater than right lower 70 claudication  . Tubular adenoma of colon 07/2007   Multiple colonic polyps  . Venous stasis    Past Surgical History:  Procedure Laterality Date  . CARDIAC CATHETERIZATION     stents  . CHOLECYSTECTOMY N/A 07/30/2015   Procedure: LAPAROSCOPIC CHOLECYSTECTOMY;  Surgeon: Aviva Signs, MD;  Location: [facility];  Service: General;  Laterality: N/A;  . COLONOSCOPY W/ POLYPECTOMY  07/2007   Tubular adenoma  . CORONARY STENT PLACEMENT    . CYSTECTOMY     Pilonidal  . ESOPHAGOGASTRODUODENOSCOPY  04/2010   RMR: Normal esophagus , Stomach D1 and D2   . LOWER EXTREMITY ANGIOGRAPHY N/A 03/13/2016   Procedure: Lower Extremity Angiography;  Surgeon: [name], MD;  Location: [facility];  Service: Cardiovascular;  Laterality: N/A;  . PERIPHERAL VASCULAR ATHERECTOMY  03/27/2016   Procedure: Peripheral Vascular Atherectomy;  Surgeon: [name], MD;  Location: [facility];  Service: Cardiovascular;;  Attempted atherectomy  . PERIPHERAL VASCULAR CATHETERIZATION Bilateral 09/28/2014   Procedure: Lower Extremity Angiography;  Surgeon: [name], MD;  Location: [facility];  Service: Cardiovascular;  Laterality: Bilateral;  . PERIPHERAL VASCULAR CATHETERIZATION N/A 09/28/2014   Procedure: Abdominal Aortogram;  Surgeon: [name], MD;  Location: [facility];  Service: Cardiovascular;  Laterality: N/A;  . PERIPHERAL VASCULAR CATHETERIZATION N/A 10/15/2014   Procedure: Lower Extremity Angiography;  Surgeon: [name], MD;  Location: [facility];  Service: Cardiovascular;  Laterality: N/A;  . PERIPHERAL VASCULAR CATHETERIZATION  10/15/2014   Procedure: Peripheral Vascular Intervention;  Surgeon: [name], MD;  Location: [facility];  Service: Cardiovascular;;  bilateral iliac stents  . PILONIDAL CYST / SINUS EXCISION    . TUBAL LIGATION Bilateral    Social History   Social History  . Marital status: Divorced    Spouse name: N/A  . Number of children: 1  . 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   Social History Main Topics  . Smoking status: Current Every Day Smoker    Packs/day: 0.50    Years: 47.00    Types: Cigarettes  . Smokeless tobacco: Never Used  . Alcohol use No  . Drug use: No  . Sexual activity: Yes    Partners: Male    Birth control/ protection: Condom     Comment: mutual friends   Other Topics Concern  . None   Social History Narrative  . None   Outpatient Encounter Prescriptions as of 03/30/2016  Medication Sig  . aspirin EC 81 MG tablet Take 81 mg by mouth daily.    Marland Kitchen gabapentin (NEURONTIN) 100 MG capsule Take 300 mg by mouth at bedtime.  . hydrochlorothiazide (MICROZIDE) 12.5  MG capsule Take 1 capsule (12.5 mg total) by mouth daily.  Marland Kitchen HYDROcodone-acetaminophen (NORCO/VICODIN) 5-325 MG tablet Take 1 tablet by mouth every 6 (six) hours as needed. Use sparingly (Patient taking differently: Take 1 tablet by mouth every 6 (six) hours as needed for moderate pain. Use sparingly)  . insulin aspart (NOVOLOG) 100 UNIT/ML FlexPen Inject 10-16 Units into the skin 3 (three) times daily with meals.  . insulin glargine (LANTUS) 100 UNIT/ML injection Inject 0.4 mLs (40 Units total) into the skin at bedtime.  Marland Kitchen levothyroxine (SYNTHROID, LEVOTHROID)  75 MCG tablet TAKE 1 TABLET BY MOUTH DAILY BEFORE BREAKFAST (Patient taking differently: Take 75 mcg by mouth daily before breakfast. )  . meclizine (ANTIVERT) 25 MG tablet Take 25 mg by mouth as needed for dizziness.  . metFORMIN (GLUCOPHAGE) 500 MG tablet TAKE 1 TABLET BY MOUTH TWICE A DAY.  Marland Kitchen oxybutynin (DITROPAN-XL) 10 MG 24 hr tablet Take 2 tablets (20 mg total) by mouth daily.  . pantoprazole (PROTONIX) 40 MG tablet TAKE 1 TABLET BY MOUTH EVERY DAY (Patient taking differently: TAKE 1 TABLET(40MG ) BY MOUTH EVERY DAY)  . potassium chloride SA (KLOR-CON M20) 20 MEQ tablet Take 1 tablet (20 mEq total) by mouth 2 (two) times daily.  . rosuvastatin (CRESTOR) 20 MG tablet Take 1 tablet (20 mg total) by mouth at bedtime.  . [DISCONTINUED] insulin aspart (NOVOLOG) 100 UNIT/ML FlexPen Inject 6 units into skin at dinner. Take 10 units of glucose is greater than 175 (Patient taking differently: Inject 8-12 Units into the skin 2 (two) times daily with a meal. )  . [DISCONTINUED] insulin glargine (LANTUS) 100 UNIT/ML injection Inject 0.82 mLs (82 Units total) into the skin at bedtime. (Patient taking differently: Inject 84 Units into the skin at bedtime. )   No facility-administered encounter medications on file as of 03/30/2016.    ALLERGIES: Allergies  Allergen Reactions  . Altace [Ramipril] Cough  . Biaxin [Clarithromycin]     Fatigue  . Niacin And Related Other (See Comments)    Flush - felt like on fire.  . Nsaids Other (See Comments)    feverish  . Adhesive [Tape] Rash    Paper tape is ok   VACCINATION STATUS: Immunization History  Administered Date(s) Administered  . Influenza,inj,Quad PF,36+ Mos 10/18/2015  . Influenza-Unspecified 11/07/2007, 12/07/2013  . Pneumococcal Polysaccharide-23 12/06/2006    Diabetes  She presents for her follow-up diabetic visit. She has type 2 diabetes mellitus. Onset time: She was diagnosed at approximate age of 62 years. Her disease course has been  worsening (She was seen last in this clinic in December 2015 at which time her A1c was 7.2% improving from 12.3%. Unfortunately she no showed for almost 3 years and her A1c is now 10%). There are no hypoglycemic associated symptoms. Pertinent negatives for hypoglycemia include no confusion, headaches, pallor or seizures. Associated symptoms include blurred vision, fatigue, foot paresthesias, foot ulcerations, polydipsia and polyuria. Pertinent negatives for diabetes include no chest pain and no polyphagia. There are no hypoglycemic complications. Symptoms are worsening. Diabetic complications include autonomic neuropathy, nephropathy, PVD and retinopathy. Risk factors for coronary artery disease include diabetes mellitus, dyslipidemia, hypertension, sedentary lifestyle and tobacco exposure. Current diabetic treatments: She says she is on Lantus 82 units nightly, NovoLog to 10 units 3 times a day before meals, metformin 500 mg by mouth twice a day. She is compliant with treatment none of the time. Her weight is decreasing steadily. She is following a generally unhealthy diet. When asked about  meal planning, she reported none. She has not had a previous visit with a dietitian. She never participates in exercise. There is no compliance with monitoring of blood glucose. Home blood sugar record trend: She comes in with no meter nor logs to review. An ACE inhibitor/angiotensin II receptor blocker is contraindicated. She sees a podiatrist.Eye exam is current.  Hyperlipidemia  This is a chronic problem. The current episode started more than 1 year ago. The problem is uncontrolled. Recent lipid tests were reviewed and are variable. Pertinent negatives include no chest pain, myalgias or shortness of breath. Current antihyperlipidemic treatment includes statins. Risk factors for coronary artery disease include dyslipidemia, family history, hypertension, diabetes mellitus and a sedentary lifestyle.  Hypertension  Associated  symptoms include blurred vision. Pertinent negatives include no chest pain, headaches, palpitations or shortness of breath. Risk factors for coronary artery disease include dyslipidemia, diabetes mellitus, smoking/tobacco exposure and sedentary lifestyle. Hypertensive end-organ damage includes PVD and retinopathy.     Review of Systems  Constitutional: Positive for fatigue and unexpected weight change. Negative for chills and fever.  HENT: Negative for trouble swallowing and voice change.   Eyes: Positive for blurred vision. Negative for visual disturbance.  Respiratory: Negative for cough, shortness of breath and wheezing.   Cardiovascular: Negative for chest pain, palpitations and leg swelling.  Gastrointestinal: Negative for diarrhea, nausea and vomiting.  Endocrine: Positive for polydipsia and polyuria. Negative for cold intolerance, heat intolerance and polyphagia.  Musculoskeletal: Positive for gait problem. Negative for arthralgias and myalgias.  Skin: Negative for color change, pallor, rash and wound.  Neurological: Negative for seizures and headaches.  Psychiatric/Behavioral: Negative for confusion and suicidal ideas.    Objective:    BP 123/79   Pulse 95   Ht 5\' 3"  (1.6 m)   Wt 148 lb (67.1 kg)   BMI 26.22 kg/m   Wt Readings from Last 3 Encounters:  03/30/16 148 lb (67.1 kg)  03/27/16 140 lb (63.5 kg)  03/21/16 153 lb 9.6 oz (69.7 kg)    Physical Exam  Constitutional: She is oriented to person, place, and time. She appears well-developed.  HENT:  Head: Normocephalic and atraumatic.  Eyes: EOM are normal.  Neck: Normal range of motion. Neck supple. No tracheal deviation present. No thyromegaly present.  Cardiovascular: Normal rate and regular rhythm.   Pulses:      Dorsalis pedis pulses are 0 on the right side, and 0 on the left side.       Posterior tibial pulses are 0 on the right side, and 0 on the left side.  Pulmonary/Chest: Effort normal and breath sounds  normal.  Abdominal: Soft. Bowel sounds are normal. There is no tenderness. There is no guarding.  Musculoskeletal: Normal range of motion. She exhibits no edema.  Neurological: She is alert and oriented to person, place, and time. She has normal reflexes. No cranial nerve deficit. Coordination normal.  Skin: Skin is warm and dry. No rash noted. No erythema. No pallor.  Psychiatric: She has a normal mood and affect. Judgment normal.    CMP     Component Value Date/Time   NA 135 03/21/2016 1229   NA 139 04/17/2015 0908   K 4.0 03/21/2016 1229   CL 100 03/21/2016 1229   CO2 25 03/21/2016 1229   GLUCOSE 215 (H) 03/21/2016 1229   BUN 14 03/21/2016 1229   BUN 9 04/17/2015 0908   CREATININE 1.05 (H) 03/21/2016 1229   CALCIUM 9.4 03/21/2016 1229   PROT 6.8 07/24/2015  0629   PROT 7.0 04/17/2015 0908   ALBUMIN 3.3 (L) 07/24/2015 0629   ALBUMIN 4.0 04/17/2015 0908   AST 11 (L) 07/24/2015 0629   ALT 8 (L) 07/24/2015 0629   ALKPHOS 120 07/24/2015 0629   BILITOT 0.2 (L) 07/24/2015 0629   BILITOT <0.2 04/17/2015 0908   GFRNONAA 57 (L) 03/21/2016 1229   GFRAA 65 03/21/2016 1229     Diabetic Labs (most recent): Lab Results  Component Value Date   HGBA1C 10.0 03/03/2016   HGBA1C 10.3 10/18/2015   HGBA1C 12.3 (H) 04/17/2015     Lipid Panel ( most recent) Lipid Panel     Component Value Date/Time   CHOL 192 04/17/2015 0908   TRIG 247 (H) 04/17/2015 0908   HDL 33 (L) 04/17/2015 0908   CHOLHDL 5.8 (H) 04/17/2015 0908   CHOLHDL 3.6 04/18/2014 0833   VLDL 23 04/18/2014 0833   LDLCALC 110 (H) 04/17/2015 0908      Assessment & Plan:   1. DM type 2 causing vascular disease ([place]), Peripheral arterial disease which required bypass graft, retinopathy, nephropathy   - Her diabetes is  also complicated by alarming noncompliance/nonadherence, she disappeared from care for 3 years, and patient remains at a high risk for more acute and chronic complications of diabetes which include CAD,  CVA, CKD, retinopathy, and neuropathy. These are all discussed in detail with the patient.  Patient came with out glucose profile, and  recent A1c of 10% increasing from 7.2%.  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   - I have re-counseled the patient on diet management and   by adopting a carbohydrate restricted / protein rich  Diet.  - Suggestion is made for patient to avoid simple carbohydrates   from their diet including Cakes , Desserts, Ice Cream,  Soda (  diet and regular) , Sweet Tea , Candies,  Chips, Cookies, Artificial Sweeteners,   and "Sugar-free" Products .  This will help patient to have stable blood glucose profile and potentially avoid unintended  Weight gain.  - Patient is advised to stick to a routine mealtimes to eat 3 meals  a day and avoid unnecessary snacks ( to snack only to correct hypoglycemia).  - The patient  Will be  scheduled with Jearld Fenton, RDN, CDE for individualized DM education.  - I have approached patient with the following individualized plan to manage diabetes and patient agrees.  - Despite the fact that her A1c was 10% recently , since she came with no meter nor logs, I will proceed to lower her insulin for safety reasons. - I urged her to resume monitoring of blood glucose 4 times a day-3 times a day before meals and daily at bedtime.  - I will lower her Lantus to 40 units daily at bedtime, readjust her prandial insulin NovoLog to 10 units 3 times a day before meals for pre-meal BG readings of 90-150mg /dl, plus patient specific correction dose of rapid acting insulin  for unexpected hyperglycemia above 150mg /dl, associated with strict monitoring of glucose  AC and HS. - Patient is warned not to take insulin without proper monitoring per orders. -Adjustment parameters are given for hypo and hyperglycemia in writing. -Patient is encouraged to call clinic for blood glucose levels less than 70 or above 300 mg /dl.  - At this time,  patient is not a candidate for metformin, SGLT2 inhibitors due to CKD and severe peripheral arterial disease which required bypass surgery. - Insulin is exclusive choice of  therapy for her diabetes at this time. - Patient specific target  for A1c; LDL, HDL, Triglycerides, and  Waist Circumference were discussed in detail.  2) BP/HTN: Controlled. Continue current medications, she is allergic to ACE inhibitors. 3) Lipids/HPL: Uncontrolled with LDL 110.  Patient is advised to continue statins. 4)  Weight/Diet:  he has lost significant weight since her last visit, CDE consult will be initiated, and carbohydrates information provided.  5) Chronic Care/Health Maintenance:  -Patient is Statin medications and encouraged to continue to follow up with Ophthalmology, Podiatrist at least yearly or according to recommendations, and advised to quit smoking. I have recommended yearly flu vaccine and pneumonia vaccination at least every 5 years; moderate intensity exercise for up to 150 minutes weekly; and  sleep for at least 7 hours a day.  - 25 minutes of time was spent on the care of this patient , 50% of which was applied for counseling on diabetes complications and their preventions.  - I advised patient to maintain close follow up with [name], MD for primary care needs.  Patient is asked to bring meter and  blood glucose logs during  next visit.   Follow up plan: -Return in about 1 week (around 04/06/2016) for meter, and logs, follow up with meter and logs- no labs.  Glade Lloyd, MD Phone: [phone]  Fax: [phone]   03/30/2016, 9:51 AM

## 2016-03-31 ENCOUNTER — Ambulatory Visit: Payer: BLUE CROSS/BLUE SHIELD | Admitting: Cardiovascular Disease

## 2016-03-31 MED ORDER — CLOPIDOGREL BISULFATE 75 MG PO TABS: 75.0000 mg | ORAL_TABLET | Freq: Every day | ORAL | 0 refills | Status: DC

## 2016-03-31 MED ORDER — CLOPIDOGREL BISULFATE 75 MG PO TABS: 75.0000 mg | ORAL_TABLET | Freq: Every day | ORAL | 3 refills | Status: DC

## 2016-03-31 NOTE — Telephone Encounter (Signed)
Returned call-unable to reach, left detailed message (ok per DPR) with recommendations:  [name], MD   9:09 AM  Note    Start Plavix 75 mg a day      Advised 30 day Rx sent to local pharmacy. 90 day Rx sent to [facility].    Pt advised to call with questions or concerns.

## 2016-03-31 NOTE — Telephone Encounter (Signed)
Start Plavix 75 mg a day

## 2016-04-06 ENCOUNTER — Ambulatory Visit (INDEPENDENT_AMBULATORY_CARE_PROVIDER_SITE_OTHER): Payer: BLUE CROSS/BLUE SHIELD | Admitting: "Endocrinology

## 2016-04-06 ENCOUNTER — Encounter: Payer: Self-pay | Admitting: "Endocrinology

## 2016-04-06 VITALS — BP 115/78 | HR 90 | Ht 63.0 in | Wt 149.0 lb

## 2016-04-06 DIAGNOSIS — E039 Hypothyroidism, unspecified: Secondary | ICD-10-CM

## 2016-04-06 DIAGNOSIS — E782 Mixed hyperlipidemia: Secondary | ICD-10-CM

## 2016-04-06 DIAGNOSIS — Z91199 Patient's noncompliance with other medical treatment and regimen due to unspecified reason: Secondary | ICD-10-CM

## 2016-04-06 DIAGNOSIS — I10 Essential (primary) hypertension: Secondary | ICD-10-CM | POA: Diagnosis not present

## 2016-04-06 DIAGNOSIS — E1159 Type 2 diabetes mellitus with other circulatory complications: Secondary | ICD-10-CM | POA: Diagnosis not present

## 2016-04-06 DIAGNOSIS — Z9119 Patient's noncompliance with other medical treatment and regimen: Secondary | ICD-10-CM

## 2016-04-06 MED ORDER — LANCETS MISC: 1.0000 | 3 refills | Status: DC

## 2016-04-06 MED ORDER — GLUCOSE BLOOD VI STRP: ORAL_STRIP | 2 refills | Status: DC

## 2016-04-06 MED ORDER — INSULIN PEN NEEDLE 31G X 8 MM MISC: 1.0000 | 3 refills | Status: DC

## 2016-04-06 NOTE — Progress Notes (Signed)
Subjective:    Patient ID: Rachel [name], female    DOB: 04-Sep-1950, PCP [name], MD   Past Medical History:  Diagnosis Date  . Asthma   . Coronary atherosclerosis of native coronary artery    DES x 2 (overlapping) RCA 03/2008 - patent 2011  . Diabetes type 2, controlled ([place]) 2000  . Essential hypertension, benign   . Fatty liver   . GERD (gastroesophageal reflux disease)   . Hyperlipidemia   . Microalbuminuria   . Obesity   . Odynophagia    Severe esophagitis on CT 05/2010, treated empirically with Diflucan  . PAD (peripheral artery disease) (HCC)    history of left greater than right lower 70 claudication  . Tubular adenoma of colon 07/2007   Multiple colonic polyps  . Venous stasis    Past Surgical History:  Procedure Laterality Date  . CARDIAC CATHETERIZATION     stents  . CHOLECYSTECTOMY N/A 07/30/2015   Procedure: LAPAROSCOPIC CHOLECYSTECTOMY;  Surgeon: Aviva Signs, MD;  Location: [facility];  Service: General;  Laterality: N/A;  . COLONOSCOPY W/ POLYPECTOMY  07/2007   Tubular adenoma  . CORONARY STENT PLACEMENT    . CYSTECTOMY     Pilonidal  . ESOPHAGOGASTRODUODENOSCOPY  04/2010   RMR: Normal esophagus , Stomach D1 and D2   . LOWER EXTREMITY ANGIOGRAPHY N/A 03/13/2016   Procedure: Lower Extremity Angiography;  Surgeon: [name], MD;  Location: [facility];  Service: Cardiovascular;  Laterality: N/A;  . PERIPHERAL VASCULAR ATHERECTOMY  03/27/2016   Procedure: Peripheral Vascular Atherectomy;  Surgeon: [name], MD;  Location: [place] [facility];  Service: Cardiovascular;;  Attempted atherectomy  . PERIPHERAL VASCULAR CATHETERIZATION Bilateral 09/28/2014   Procedure: Lower Extremity Angiography;  Surgeon: [name], MD;  Location: [place];  Service: Cardiovascular;  Laterality: Bilateral;  . PERIPHERAL VASCULAR CATHETERIZATION N/A 09/28/2014   Procedure: Abdominal Aortogram;  Surgeon: [name], MD;  Location: [place];  Service: Cardiovascular;  Laterality: N/A;  . PERIPHERAL VASCULAR CATHETERIZATION N/A 10/15/2014   Procedure: Lower Extremity Angiography;  Surgeon: [name], MD;  Location: [place] [facility];  Service: Cardiovascular;  Laterality: N/A;  . PERIPHERAL VASCULAR CATHETERIZATION  10/15/2014   Procedure: Peripheral Vascular Intervention;  Surgeon: [name], MD;  Location: [facility];  Service: Cardiovascular;;  bilateral iliac stents  . PILONIDAL CYST / SINUS EXCISION    . TUBAL LIGATION Bilateral    Social History   Social History  . Marital status: Divorced    Spouse name: N/A  . Number of children: 1  . 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   Social History Main Topics  . Smoking status: Current Every Day Smoker    Packs/day: 0.50    Years: 47.00    Types: Cigarettes  . Smokeless tobacco: Never Used  . Alcohol use No  . Drug use: No  . Sexual activity: Yes    Partners: Male    Birth control/ protection: Condom     Comment: mutual friends   Other Topics Concern  . None   Social History Narrative  . None   Outpatient Encounter Prescriptions as of 04/06/2016  Medication Sig  . aspirin EC 81 MG tablet Take 81 mg by mouth daily.    . clopidogrel (PLAVIX) 75 MG tablet Take 1 tablet (75 mg total) by mouth daily.  Marland Kitchen gabapentin (  NEURONTIN) 100 MG capsule Take 300 mg by mouth at bedtime.  Marland Kitchen glucose blood (ONE TOUCH TEST STRIPS) test strip Use as instructed  . hydrochlorothiazide (MICROZIDE) 12.5 MG capsule Take 1 capsule (12.5 mg total) by mouth daily.  Marland Kitchen HYDROcodone-acetaminophen (NORCO/VICODIN) 5-325 MG tablet Take 1 tablet by mouth every 6 (six) hours as needed. Use sparingly (Patient taking differently: Take 1 tablet by mouth every 6 (six) hours as needed for moderate pain. Use sparingly)  . insulin aspart (NOVOLOG) 100 UNIT/ML FlexPen Inject 10-16 Units into the skin 3 (three) times daily with meals.   . insulin glargine (LANTUS) 100 UNIT/ML injection Inject 0.4 mLs (40 Units total) into the skin at bedtime.  . Insulin Pen Needle (B-D ULTRAFINE III SHORT PEN) 31G X 8 MM MISC 1 each by Does not apply route as directed.  . Lancets MISC 1 each by Does not apply route as directed.  Marland Kitchen levothyroxine (SYNTHROID, LEVOTHROID) 75 MCG tablet TAKE 1 TABLET BY MOUTH DAILY BEFORE BREAKFAST (Patient taking differently: Take 75 mcg by mouth daily before breakfast. )  . meclizine (ANTIVERT) 25 MG tablet Take 25 mg by mouth as needed for dizziness.  Marland Kitchen oxybutynin (DITROPAN-XL) 10 MG 24 hr tablet Take 2 tablets (20 mg total) by mouth daily.  . pantoprazole (PROTONIX) 40 MG tablet TAKE 1 TABLET BY MOUTH EVERY DAY (Patient taking differently: TAKE 1 TABLET(40MG ) BY MOUTH EVERY DAY)  . potassium chloride SA (KLOR-CON M20) 20 MEQ tablet Take 1 tablet (20 mEq total) by mouth 2 (two) times daily.  . rosuvastatin (CRESTOR) 20 MG tablet Take 1 tablet (20 mg total) by mouth at bedtime.  . [DISCONTINUED] metFORMIN (GLUCOPHAGE) 500 MG tablet TAKE 1 TABLET BY MOUTH TWICE A DAY.   No facility-administered encounter medications on file as of 04/06/2016.    ALLERGIES: Allergies  Allergen Reactions  . Altace [Ramipril] Cough  . Biaxin [Clarithromycin]     Fatigue  . Niacin And Related Other (See Comments)    Flush - felt like on fire.  . Nsaids Other (See Comments)    feverish  . Adhesive [Tape] Rash    Paper tape is ok   VACCINATION STATUS: Immunization History  Administered Date(s) Administered  . Influenza,inj,Quad PF,36+ Mos 10/18/2015  . Influenza-Unspecified 11/07/2007, 12/07/2013  . Pneumococcal Polysaccharide-23 12/06/2006    Diabetes  She presents for her follow-up diabetic visit. She has type 2 diabetes mellitus. Onset time: She was diagnosed at approximate age of 76 years. Her disease course has been improving. There are no hypoglycemic associated symptoms. Pertinent negatives for hypoglycemia include no  confusion, headaches, pallor or seizures. Associated symptoms include blurred vision, fatigue, foot paresthesias, foot ulcerations, polydipsia and polyuria. Pertinent negatives for diabetes include no chest pain and no polyphagia. There are no hypoglycemic complications. Symptoms are improving. Diabetic complications include autonomic neuropathy, nephropathy, PVD and retinopathy. Risk factors for coronary artery disease include diabetes mellitus, dyslipidemia, hypertension, sedentary lifestyle and tobacco exposure. Current diabetic treatments: She says she is on Lantus 82 units nightly, NovoLog to 10 units 3 times a day before meals, metformin 500 mg by mouth twice a day. She is compliant with treatment none of the time. Her weight is stable. She is following a generally unhealthy diet. When asked about meal planning, she reported none. She has not had a previous visit with a dietitian. She never participates in exercise. Blood glucose monitoring compliance is adequate. Home blood sugar record trend: She comes in with no meter nor logs to review. Her  breakfast blood glucose range is generally 180-200 mg/dl. Her lunch blood glucose range is generally 180-200 mg/dl. Her dinner blood glucose range is generally 180-200 mg/dl. Her overall blood glucose range is 180-200 mg/dl. An ACE inhibitor/angiotensin II receptor blocker is contraindicated. She sees a podiatrist.Eye exam is current.  Hyperlipidemia  This is a chronic problem. The current episode started more than 1 year ago. The problem is uncontrolled. Recent lipid tests were reviewed and are variable. Pertinent negatives include no chest pain, myalgias or shortness of breath. Current antihyperlipidemic treatment includes statins. Risk factors for coronary artery disease include dyslipidemia, family history, hypertension, diabetes mellitus and a sedentary lifestyle.  Hypertension  Associated symptoms include blurred vision. Pertinent negatives include no chest  pain, headaches, palpitations or shortness of breath. Risk factors for coronary artery disease include dyslipidemia, diabetes mellitus, smoking/tobacco exposure and sedentary lifestyle. Hypertensive end-organ damage includes PVD and retinopathy.     Review of Systems  Constitutional: Positive for fatigue and unexpected weight change. Negative for chills and fever.  HENT: Negative for trouble swallowing and voice change.   Eyes: Positive for blurred vision. Negative for visual disturbance.  Respiratory: Negative for cough, shortness of breath and wheezing.   Cardiovascular: Negative for chest pain, palpitations and leg swelling.  Gastrointestinal: Negative for diarrhea, nausea and vomiting.  Endocrine: Positive for polydipsia and polyuria. Negative for cold intolerance, heat intolerance and polyphagia.  Musculoskeletal: Positive for gait problem. Negative for arthralgias and myalgias.  Skin: Negative for color change, pallor, rash and wound.  Neurological: Negative for seizures and headaches.  Psychiatric/Behavioral: Negative for confusion and suicidal ideas.    Objective:    BP 115/78   Pulse 90   Ht 5\' 3"  (1.6 m)   Wt 149 lb (67.6 kg)   BMI 26.39 kg/m   Wt Readings from Last 3 Encounters:  04/06/16 149 lb (67.6 kg)  03/30/16 148 lb (67.1 kg)  03/27/16 140 lb (63.5 kg)    Physical Exam  Constitutional: She is oriented to person, place, and time. She appears well-developed.  HENT:  Head: Normocephalic and atraumatic.  Eyes: EOM are normal.  Neck: Normal range of motion. Neck supple. No tracheal deviation present. No thyromegaly present.  Cardiovascular: Normal rate and regular rhythm.   Pulses:      Dorsalis pedis pulses are 0 on the right side, and 0 on the left side.       Posterior tibial pulses are 0 on the right side, and 0 on the left side.  Pulmonary/Chest: Effort normal and breath sounds normal.  Abdominal: Soft. Bowel sounds are normal. There is no tenderness. There  is no guarding.  Musculoskeletal: Normal range of motion. She exhibits no edema.  Neurological: She is alert and oriented to person, place, and time. She has normal reflexes. No cranial nerve deficit. Coordination normal.  Skin: Skin is warm and dry. No rash noted. No erythema. No pallor.  Psychiatric: She has a normal mood and affect. Judgment normal.    CMP     Component Value Date/Time   NA 135 03/21/2016 1229   NA 139 04/17/2015 0908   K 4.0 03/21/2016 1229   CL 100 03/21/2016 1229   CO2 25 03/21/2016 1229   GLUCOSE 215 (H) 03/21/2016 1229   BUN 14 03/21/2016 1229   BUN 9 04/17/2015 0908   CREATININE 1.05 (H) 03/21/2016 1229   CALCIUM 9.4 03/21/2016 1229   PROT 6.8 07/24/2015 0629   PROT 7.0 04/17/2015 0908   ALBUMIN 3.3 (L)  07/24/2015 0629   ALBUMIN 4.0 04/17/2015 0908   AST 11 (L) 07/24/2015 0629   ALT 8 (L) 07/24/2015 0629   ALKPHOS 120 07/24/2015 0629   BILITOT 0.2 (L) 07/24/2015 0629   BILITOT <0.2 04/17/2015 0908   GFRNONAA 57 (L) 03/21/2016 1229   GFRAA 65 03/21/2016 1229     Diabetic Labs (most recent): Lab Results  Component Value Date   HGBA1C 10.0 03/03/2016   HGBA1C 10.3 10/18/2015   HGBA1C 12.3 (H) 04/17/2015     Lipid Panel ( most recent) Lipid Panel     Component Value Date/Time   CHOL 192 04/17/2015 0908   TRIG 247 (H) 04/17/2015 0908   HDL 33 (L) 04/17/2015 0908   CHOLHDL 5.8 (H) 04/17/2015 0908   CHOLHDL 3.6 04/18/2014 0833   VLDL 23 04/18/2014 0833   LDLCALC 110 (H) 04/17/2015 0908      Assessment & Plan:   1. DM type 2 causing vascular disease ([place]), Peripheral arterial disease which required bypass graft, retinopathy, nephropathy  - Her diabetes is  also complicated by alarming noncompliance/nonadherence, she disappeared from care for 3 years, and patient remains at a high risk for more acute and chronic complications of diabetes which include CAD, CVA, CKD, retinopathy, and neuropathy. These are all discussed in detail with the  patient.  Patient came with out glucose profile, and  recent A1c of 10% increasing from 7.2%.  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   - I have re-counseled the patient on diet management and   by adopting a carbohydrate restricted / protein rich  Diet.  - Suggestion is made for patient to avoid simple carbohydrates   from their diet including Cakes , Desserts, Ice Cream,  Soda (  diet and regular) , Sweet Tea , Candies,  Chips, Cookies, Artificial Sweeteners,   and "Sugar-free" Products .  This will help patient to have stable blood glucose profile and potentially avoid unintended  Weight gain.  - Patient is advised to stick to a routine mealtimes to eat 3 meals  a day and avoid unnecessary snacks ( to snack only to correct hypoglycemia).  - The patient  Will be  scheduled with Jearld Fenton, RDN, CDE for individualized DM education.  - I have approached patient with the following individualized plan to manage diabetes and patient agrees.  - She came with better commitment and monitor blood glucose before meals and at bedtime averaging 181 for the last 7 days of 23 readings. - I urged her to continue monitoring of blood glucose 4 times a day-3 times a day before meals and daily at bedtime.  - I will continue Lantus  40 units daily at bedtime, continue prandial insulin NovoLog  10 units 3 times a day before meals for pre-meal BG readings of 90-150mg /dl, plus patient specific correction dose of rapid acting insulin  for unexpected hyperglycemia above 150mg /dl, associated with strict monitoring of glucose  AC and HS. - Patient is warned not to take insulin without proper monitoring per orders. -Adjustment parameters are given for hypo and hyperglycemia in writing. -Patient is encouraged to call clinic for blood glucose levels less than 70 or above 300 mg /dl.  - At this time, patient is not a candidate for metformin, SGLT2 inhibitors due to CKD and severe peripheral arterial  disease which required bypass surgery. - I advised her to discontinue metformin. - Insulin is exclusive choice of therapy for her diabetes at this time. - Patient specific target  for A1c; LDL, HDL, Triglycerides, and  Waist Circumference were discussed in detail.  2) BP/HTN: Controlled. Continue current medications, she is allergic to ACE inhibitors. 3) Lipids/HPL: Uncontrolled with LDL 110.  Patient is advised to continue statins. 4)  Weight/Diet:  he has lost significant weight since her last visit, CDE consult will be initiated, and carbohydrates information provided.  5) Chronic Care/Health Maintenance:  -Patient is Statin medications and encouraged to continue to follow up with Ophthalmology, Podiatrist at least yearly or according to recommendations, and advised to quit smoking. I have recommended yearly flu vaccine and pneumonia vaccination at least every 5 years; moderate intensity exercise for up to 150 minutes weekly; and  sleep for at least 7 hours a day.  - 25 minutes of time was spent on the care of this patient , 50% of which was applied for counseling on diabetes complications and their preventions.  - I advised patient to maintain close follow up with [name], MD for primary care needs.  Patient is asked to bring meter and  blood glucose logs during  next visit.   Follow up plan: -Return in about 10 weeks (around 06/15/2016) for meter, and logs.  Glade Lloyd, MD Phone: [phone]  Fax: [phone]   04/06/2016, 9:27 AM

## 2016-04-14 ENCOUNTER — Ambulatory Visit (INDEPENDENT_AMBULATORY_CARE_PROVIDER_SITE_OTHER): Payer: BLUE CROSS/BLUE SHIELD | Admitting: Cardiovascular Disease

## 2016-04-14 ENCOUNTER — Encounter: Payer: Self-pay | Admitting: Cardiovascular Disease

## 2016-04-14 VITALS — BP 142/84 | HR 102 | Ht 63.0 in | Wt 150.0 lb

## 2016-04-14 DIAGNOSIS — I739 Peripheral vascular disease, unspecified: Secondary | ICD-10-CM | POA: Diagnosis not present

## 2016-04-14 NOTE — Assessment & Plan Note (Signed)
Rachel [name] returns today for follow-up after her recent aborted intervention on 03/27/16. I previously stented both her iliac arteries 10/15/14 was significant improvement in her symptoms. She was having reddish discoloration and pain in her left foot and because of this after angiogramming  her on 03/13/16 and being unable to cross the iliac bifurcation I brought her back on 03/27/16 the intent to perform antegrade access on her left common femoral artery. I was unable to access the femoral artery using an antegrade approach and a micropuncture needle because of the diffuse calcification and aborted the procedure. Since that time the redness has improved and the pain has subsided. She really hasn't ambulate much however. Should she require hospitalization she would need left common femoral endarterectomy and patch angioplasty followed by staged left SFA diamondback orbital rotational atherectomy, PTA using antegrade approach. She does have one vessel runoff on the left side via an anterior tibial artery.

## 2016-04-14 NOTE — Patient Instructions (Signed)
Medication Instructions: Your physician recommends that you continue on your current medications as directed. Please refer to the Current Medication list given to you today.  Testing/Procedures: Your physician has requested that you have a lower extremity arterial duplex. During this test, ultrasound is used to evaluate arterial blood flow in the legs. Allow one hour for this exam. There are no restrictions or special instructions.  Your physician has requested that you have an ankle brachial index (ABI). During this test an ultrasound and blood pressure cuff are used to evaluate the arteries that supply the arms and legs with blood. Allow thirty minutes for this exam. There are no restrictions or special instructions. (In 6 months)  Follow-Up: Your physician wants you to follow-up in: 6 months with Dr. Gwenlyn Found. You will receive a reminder letter in the mail two months in advance. If you don't receive a letter, please call our office to schedule the follow-up appointment.  If you need a refill on your cardiac medications before your next appointment, please call your pharmacy.

## 2016-04-14 NOTE — Progress Notes (Signed)
04/14/2016 Rachel [name]   1950-05-17  [id number]  Primary Physician Rachel Hillier, MD Primary Cardiologist: Rachel Harp MD Rachel [name]  HPI:  Rachel [name] is a delightful 66 year old mildly overweight divorced African-American female mother of one, grandmother of one grandchild who is referred by Dr. Domenic [name] for lifestyle limiting claudication. I last saw her in the office 03/21/16.Her primary care physician is Dr. Mickie [name]. She has a history of tobacco abuse smoking one pack per day for last 45 years, treated hypertension, diabetes and hyperlipidemia. She does have ischemic heart disease status post RCA intervention February 2010 with overlapping drug eluting stents in her RCA. She had repeat cath the following year revealing these to be widely patent. She complains of right lower extremity claudication claudication left greater than right over the last 2 years. 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 I reviewed this with Rachel [name] who suggested that we attempt percutaneous vascular station before considering aortobifemoral bypass grafting. I angiogram on 10/15/14 and was able to cross her left common iliac chronic total occlusion. I stented both iliac arteries with I cast cover stents using "kissing stent technique". Her procedure was stopped. By a significant hematoma in the left side with a left common femoral pseudoaneurysm that was partially thrombosed initially and follow-up Doppler was completely thrombosed. Her hemoglobin stabilized not requiring transfusion. Her claudication has resolved although she still has some pain in her left hip from resolving hematoma. Her Doppler studies showed  post procedure normal ABIs bilaterally and her claudication resolved..  Since I saw her last she developed ischemic changes on her left heel. Her left ABI has dropped to 0.64. Her left femoral pulse is much weaker than her right. I suspect her left iliac stent is occluded. I performed peripheral angiography with attempt to intervene on 03/13/16 was unable to cross the iliac bifurcation because of the acute angle and stents. I brought her back on 03/27/16 intent to perform antegrade approach on her left common femoral artery and treat her mid left SFA however I was unable to access the femoral artery because of diffuse calcification and therefore the procedure was aborted. Since that time the redness and pain in her left foot has improved spontaneously. I am really much and does not describe lifestyle limiting claudication at this time. Should she require catheterization she would need left common femoral endarterectomy with patch angioplasty was staged left SFA diamondback orbital rotation arthrectomy using antegrade approach. She does have one vessel runoff in the left leg via an anterior tibial artery.   Current Outpatient Prescriptions  Medication Sig Dispense Refill  . aspirin EC 81 MG tablet Take 81 mg by mouth daily.      . clopidogrel (PLAVIX) 75 MG tablet Take 1 tablet (75 mg total) by mouth daily. 90 tablet 3  . gabapentin (NEURONTIN) 100 MG capsule Take 300 mg by mouth at bedtime.  6  . glucose blood (ONE TOUCH TEST STRIPS) test strip Use as instructed 150 each 2  . hydrochlorothiazide (MICROZIDE) 12.5 MG capsule Take 1 capsule (12.5 mg total) by mouth daily. 90 capsule 1  . HYDROcodone-acetaminophen (NORCO/VICODIN) 5-325 MG tablet Take 1 tablet by mouth every 6 (six) hours as needed. Use  sparingly (Patient taking differently: Take 1 tablet by mouth every 6 (six) hours as needed for moderate pain. Use sparingly) 36 tablet 0  . insulin aspart (NOVOLOG) 100 UNIT/ML FlexPen Inject 10-16 Units into  the skin 3 (three) times daily with meals. 5 pen 2  . insulin glargine (LANTUS) 100 UNIT/ML injection Inject 0.4 mLs (40 Units total) into the skin at bedtime. 15 mL 2  . Insulin Pen Needle (B-D ULTRAFINE III SHORT PEN) 31G X 8 MM MISC 1 each by Does not apply route as directed. 150 each 3  . Lancets MISC 1 each by Does not apply route as directed. 150 each 3  . levothyroxine (SYNTHROID, LEVOTHROID) 75 MCG tablet TAKE 1 TABLET BY MOUTH DAILY BEFORE BREAKFAST (Patient taking differently: Take 75 mcg by mouth daily before breakfast. ) 90 tablet 1  . meclizine (ANTIVERT) 25 MG tablet Take 25 mg by mouth as needed for dizziness.    Marland Kitchen oxybutynin (DITROPAN-XL) 10 MG 24 hr tablet Take 2 tablets (20 mg total) by mouth daily. 180 tablet 1  . pantoprazole (PROTONIX) 40 MG tablet TAKE 1 TABLET BY MOUTH EVERY DAY (Patient taking differently: TAKE 1 TABLET(40MG ) BY MOUTH EVERY DAY) 30 tablet 2  . potassium chloride SA (KLOR-CON M20) 20 MEQ tablet Take 1 tablet (20 mEq total) by mouth 2 (two) times daily. 60 tablet 5  . rosuvastatin (CRESTOR) 20 MG tablet Take 1 tablet (20 mg total) by mouth at bedtime. 90 tablet 1   No current facility-administered medications for this visit.     Allergies  Allergen Reactions  . Altace [Ramipril] Cough  . Biaxin [Clarithromycin]     Fatigue  . Niacin And Related Other (See Comments)    Flush - felt like on fire.  . Nsaids Other (See Comments)    feverish  . Adhesive [Tape] Rash    Paper tape is ok    Social History   Social History  . Marital status: Divorced    Spouse name: N/A  . Number of children: 1  . 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   Social History Main Topics  . Smoking status: Current Every Day Smoker    Packs/day: 0.50    Years: 47.00    Types: Cigarettes  . Smokeless tobacco: Never Used  . Alcohol use No  . Drug use: No  . Sexual activity: Yes    Partners: Male    Birth control/ protection:  Condom     Comment: mutual friends   Other Topics Concern  . Not on file   Social History Narrative  . No narrative on file     Review of Systems: General: negative for chills, fever, night sweats or weight changes.  Cardiovascular: negative for chest pain, dyspnea on exertion, edema, orthopnea, palpitations, paroxysmal nocturnal dyspnea or shortness of breath Dermatological: negative for rash Respiratory: negative for cough or wheezing Urologic: negative for hematuria Abdominal: negative for nausea, vomiting, diarrhea, bright red blood per rectum, melena, or hematemesis Neurologic: negative for visual changes, syncope, or dizziness All other systems reviewed and are otherwise negative except as noted above.    Blood pressure (!) 142/84, pulse (!) 102, height 5\' 3"  (1.6 m), weight 150 lb (68 kg), SpO2 98 %.  General appearance: alert and no distress Neck: no adenopathy, no carotid bruit, no JVD, supple, symmetrical, trachea midline and thyroid not enlarged, symmetric, no tenderness/mass/nodules Lungs: clear to auscultation bilaterally Heart: regular rate and rhythm,  S1, S2 normal, no murmur, click, rub or gallop Extremities: extremities normal, atraumatic, no cyanosis or edema  EKG not performed today  ASSESSMENT AND PLAN:   Peripheral arterial disease ([place]) Ms. Maietta returns today for follow-up after her recent aborted intervention on 03/27/16. I previously stented both her iliac arteries 10/15/14 was significant improvement in her symptoms. She was having reddish discoloration and pain in her left foot and because of this after angiogramming  her on 03/13/16 and being unable to cross the iliac bifurcation I brought her back on 03/27/16 the intent to perform antegrade access on her left common femoral artery. I was unable to access the femoral artery using an antegrade approach and a micropuncture needle because of the diffuse calcification and aborted the procedure. Since that time  the redness has improved and the pain has subsided. She really hasn't ambulate much however. Should she require hospitalization she would need left common femoral endarterectomy and patch angioplasty followed by staged left SFA diamondback orbital rotational atherectomy, PTA using antegrade approach. She does have one vessel runoff on the left side via an anterior tibial artery.      Rachel Harp MD FACP,FACC,FAHA, [facility] 04/14/2016 12:13 PM

## 2016-04-19 ENCOUNTER — Telehealth: Payer: Self-pay | Admitting: Family Medicine

## 2016-04-19 NOTE — Telephone Encounter (Signed)
Patient states Dr Dorris Fetch wants her to do this blood work the first of May before her appointment with him- she has a blood pressure check with you 05/04/16

## 2016-04-19 NOTE — Telephone Encounter (Signed)
Di nida's bw one wk before our visit OR delay our visit

## 2016-04-19 NOTE — Telephone Encounter (Signed)
Patient had labs ordered by Dr Dorris Fetch 04/06/16: Lipid, comprehensive metabolic panel, TSH, T4, WTGR0B, and urine microprotien

## 2016-04-19 NOTE — Telephone Encounter (Signed)
ok 

## 2016-04-19 NOTE — Telephone Encounter (Signed)
Patient is requesting labs for 60mth followup. Her appointment is 3/29.

## 2016-04-19 NOTE — Telephone Encounter (Signed)
Patient wants to just keep appointment on 05/04/16 to address swelling and other issues she is having with her legs instead of check up

## 2016-04-27 ENCOUNTER — Other Ambulatory Visit: Payer: Self-pay | Admitting: Cardiovascular Disease

## 2016-05-01 ENCOUNTER — Other Ambulatory Visit: Payer: Self-pay | Admitting: Family Medicine

## 2016-05-01 ENCOUNTER — Telehealth: Payer: Self-pay

## 2016-05-01 ENCOUNTER — Other Ambulatory Visit: Payer: Self-pay | Admitting: Cardiovascular Disease

## 2016-05-01 NOTE — Telephone Encounter (Signed)
Pt.notified

## 2016-05-01 NOTE — Telephone Encounter (Signed)
She can increase lantus to 50 units, keep novolog same. Test 4 x a day RTN on followup.

## 2016-05-01 NOTE — Telephone Encounter (Signed)
Pt states she has had high BG readings since stopping the MTF   Date Before breakfast Before lunch Before supper Bedtime  3/23 107 187 276 163  3/24 181   283  3/25 243 264    3/26 270       Pt taking: Lantus 40 units qhs, Novolog 10-16 units tidac. Pt admits she hasn't been testing regularly

## 2016-05-02 NOTE — Telephone Encounter (Signed)
REFILL 

## 2016-05-04 ENCOUNTER — Ambulatory Visit: Payer: BLUE CROSS/BLUE SHIELD | Admitting: Family Medicine

## 2016-05-04 ENCOUNTER — Encounter: Payer: Self-pay | Admitting: Family Medicine

## 2016-05-11 ENCOUNTER — Other Ambulatory Visit: Payer: Self-pay | Admitting: *Deleted

## 2016-05-11 ENCOUNTER — Ambulatory Visit (INDEPENDENT_AMBULATORY_CARE_PROVIDER_SITE_OTHER): Payer: BLUE CROSS/BLUE SHIELD | Admitting: Family Medicine

## 2016-05-11 ENCOUNTER — Other Ambulatory Visit: Payer: Self-pay

## 2016-05-11 ENCOUNTER — Other Ambulatory Visit (HOSPITAL_COMMUNITY)
Admission: RE | Admit: 2016-05-11 | Discharge: 2016-05-11 | Disposition: A | Discharge status: Home / Self Care | Payer: BLUE CROSS/BLUE SHIELD | Source: Ambulatory Visit | Attending: Family Medicine | Admitting: Family Medicine

## 2016-05-11 ENCOUNTER — Encounter: Payer: Self-pay | Admitting: Family Medicine

## 2016-05-11 ENCOUNTER — Ambulatory Visit (HOSPITAL_COMMUNITY)
Admission: RE | Admit: 2016-05-11 | Discharge: 2016-05-11 | Disposition: A | Discharge status: Home / Self Care | Payer: BLUE CROSS/BLUE SHIELD | Source: Ambulatory Visit | Attending: Family Medicine | Admitting: Family Medicine

## 2016-05-11 VITALS — BP 132/82 | Ht 63.0 in | Wt 156.8 lb

## 2016-05-11 DIAGNOSIS — I10 Essential (primary) hypertension: Secondary | ICD-10-CM | POA: Diagnosis not present

## 2016-05-11 DIAGNOSIS — M7989 Other specified soft tissue disorders: Secondary | ICD-10-CM | POA: Insufficient documentation

## 2016-05-11 DIAGNOSIS — E038 Other specified hypothyroidism: Secondary | ICD-10-CM | POA: Diagnosis not present

## 2016-05-11 DIAGNOSIS — R7989 Other specified abnormal findings of blood chemistry: Secondary | ICD-10-CM

## 2016-05-11 DIAGNOSIS — M898X6 Other specified disorders of bone, lower leg: Secondary | ICD-10-CM

## 2016-05-11 DIAGNOSIS — I739 Peripheral vascular disease, unspecified: Secondary | ICD-10-CM | POA: Diagnosis not present

## 2016-05-11 LAB — D-DIMER, QUANTITATIVE: D-Dimer, Quant: 0.63 ug/mL-FEU — ABNORMAL HIGH (ref 0.00–0.50)

## 2016-05-11 MED ORDER — GLUCOSE BLOOD VI STRP: ORAL_STRIP | 2 refills | Status: DC

## 2016-05-11 NOTE — Progress Notes (Signed)
   Subjective:    Patient ID: Rachel [name], female    DOB: August 07, 1950, 66 y.o.   MRN: [id number]  HPI Patient arrives with c/o swelling in her ankles and legs. Patient currently sees Dr Dorris Fetch for her diabetes management.  Blood pressure medicine and blood pressure levels reviewed today with patient. Compliant with blood pressure medicine. States does not miss a dose. No obvious side effects. Blood pressure generally good when checked elsewhere. Watching salt intake.  Patient continues to take lipid medication regularly. No obvious side effects from it. Generally does not miss a dose. Prior blood work results are reviewed with patient. Patient continues to work on fat intake in diet  Low thyr ,  Pt feels the plantar fascitis is the cause to her pain, overall pain is better  Right leg , pt notes more swelling, and pain and tend on the ankle, ans some discoloration.  Patient states main reason for presenting today is more swelling in the right leg pain usual. Associated with ankle pain. Recalls no injury. No history DVT. Patient does smoke. Next  Very long discussion held regarding patient's peripheral arterial disease see below     Review of Systems No headache, no major weight loss or weight gain, no chest pain no back pain abdominal pain no change in bowel habits complete ROS otherwise negative     Objective:   Physical Exam Alert and oriented, vitals reviewed and stable, NAD ENT-TM's and ext canals WNL bilat via otoscopic exam Soft palate, tonsils and post pharynx WNL via oropharyngeal exam Neck-symmetric, no masses; thyroid nonpalpable and nontender Pulmonary-no tachypnea or accessory muscle use; Clear without wheezes via auscultation Card--no abnrml murmurs, rhythm reg and rate WNL Carotid pulses symmetric, without bruits Right leg trace to 1+ edema distal anterolateral tenderness to palpation negative true Homans sign pulses decent       Assessment & Plan:    Impression 1 right leg swelling need to rule out DVT. Rationale discussed. Likely musculoskeletal with tenderness on tibia #2 peripheral arterial disease. Patient working under misconception that all of her difficulties recently her plantar fasciitis. I'm really not sure where she came up with this conclusion regarding all the notes from vascular surgeon and interventional cardiologist. Long discussion held. Patient has severe arterial compromise and needs to stop smoking yesterday. Plan DVT workup with ultrasound and d-dimer further recommendations based results  Greater than 50% of this 25 minute face to face visit was spent in counseling and discussion and coordination of care regarding the above diagnosis/diagnosies

## 2016-05-14 ENCOUNTER — Other Ambulatory Visit: Payer: Self-pay | Admitting: Family Medicine

## 2016-05-16 ENCOUNTER — Other Ambulatory Visit: Payer: Self-pay

## 2016-05-16 MED ORDER — BLOOD GLUCOSE MONITOR KIT: PACK | 0 refills | Status: DC

## 2016-05-17 ENCOUNTER — Ambulatory Visit (HOSPITAL_COMMUNITY)
Admission: RE | Admit: 2016-05-17 | Discharge: 2016-05-17 | Disposition: A | Discharge status: Home / Self Care | Payer: BLUE CROSS/BLUE SHIELD | Source: Ambulatory Visit | Attending: Family Medicine | Admitting: Family Medicine

## 2016-05-17 DIAGNOSIS — R7989 Other specified abnormal findings of blood chemistry: Secondary | ICD-10-CM | POA: Diagnosis not present

## 2016-05-17 DIAGNOSIS — M7989 Other specified soft tissue disorders: Secondary | ICD-10-CM | POA: Diagnosis not present

## 2016-05-17 DIAGNOSIS — M898X6 Other specified disorders of bone, lower leg: Secondary | ICD-10-CM | POA: Diagnosis present

## 2016-05-17 DIAGNOSIS — I739 Peripheral vascular disease, unspecified: Secondary | ICD-10-CM | POA: Insufficient documentation

## 2016-05-19 ENCOUNTER — Other Ambulatory Visit: Payer: Self-pay | Admitting: Family Medicine

## 2016-05-24 ENCOUNTER — Telehealth: Payer: Self-pay | Admitting: "Endocrinology

## 2016-05-24 MED ORDER — BAYER CONTOUR MONITOR W/DEVICE KIT: PACK | 0 refills | Status: DC

## 2016-05-24 MED ORDER — GLUCOSE BLOOD VI STRP: ORAL_STRIP | 5 refills | Status: DC

## 2016-05-24 NOTE — Telephone Encounter (Signed)
Rachel [name] is calling stating that her insurance will only pay for her to get a Chiropractor and she is asking if Dr.Nida would write a Rx for the Meter/strips/lancets to [facility] in [place]

## 2016-05-24 NOTE — Telephone Encounter (Signed)
Rx sent 

## 2016-05-29 ENCOUNTER — Telehealth: Payer: Self-pay | Admitting: "Endocrinology

## 2016-05-29 MED ORDER — INSULIN ASPART 100 UNIT/ML FLEXPEN: 10.0000 [IU] | PEN_INJECTOR | Freq: Three times a day (TID) | SUBCUTANEOUS | 2 refills | Status: DC

## 2016-05-29 NOTE — Telephone Encounter (Signed)
Rachel [name] is calling stating that her Rachel [name] has completely ran out and she needs a refill called in asap, please advise?

## 2016-06-07 ENCOUNTER — Other Ambulatory Visit: Payer: Self-pay | Admitting: "Endocrinology

## 2016-06-08 LAB — COMPREHENSIVE METABOLIC PANEL
ALBUMIN: 4.3 g/dL (ref 3.6–4.8)
ALK PHOS: 165 IU/L — AB (ref 39–117)
ALT: 11 IU/L (ref 0–32)
AST: 15 IU/L (ref 0–40)
Albumin/Globulin Ratio: 1.3 (ref 1.2–2.2)
BUN / CREAT RATIO: 14 (ref 12–28)
BUN: 17 mg/dL (ref 8–27)
Bilirubin Total: <0.2 mg/dL (ref 0.0–1.2)
CALCIUM: 9.9 mg/dL (ref 8.7–10.3)
CO2: 22 mmol/L (ref 18–29)
CREATININE: 1.21 mg/dL — AB (ref 0.57–1.00)
Chloride: 97 mmol/L (ref 96–106)
GFR, EST AFRICAN AMERICAN: 55 mL/min/{1.73_m2} — AB (ref >59)
GFR, EST NON AFRICAN AMERICAN: 48 mL/min/{1.73_m2} — AB (ref >59)
GLOBULIN, TOTAL: 3.4 g/dL (ref 1.5–4.5)
GLUCOSE: 176 mg/dL — AB (ref 65–99)
Potassium: 4.9 mmol/L (ref 3.5–5.2)
SODIUM: 136 mmol/L (ref 134–144)
TOTAL PROTEIN: 7.7 g/dL (ref 6.0–8.5)

## 2016-06-08 LAB — LIPID PANEL W/O CHOL/HDL RATIO
Cholesterol, Total: 167 mg/dL (ref 100–199)
HDL: 30 mg/dL — ABNORMAL LOW (ref >39)
LDL CALC: 101 mg/dL — AB (ref 0–99)
Triglycerides: 182 mg/dL — ABNORMAL HIGH (ref 0–149)
VLDL CHOLESTEROL CAL: 36 mg/dL (ref 5–40)

## 2016-06-08 LAB — T4, FREE: FREE T4: 1.54 ng/dL (ref 0.82–1.77)

## 2016-06-08 LAB — AMBIG ABBREV CMP14 DEFAULT

## 2016-06-08 LAB — TSH: TSH: 5.26 u[IU]/mL — AB (ref 0.450–4.500)

## 2016-06-08 LAB — AMBIG ABBREV LP DEFAULT

## 2016-06-08 LAB — HGB A1C W/O EAG: HEMOGLOBIN A1C: 9.1 % — AB (ref 4.8–5.6)

## 2016-06-08 LAB — MICROALBUMIN, URINE: MICROALBUM., U, RANDOM: 6.4 ug/mL

## 2016-06-14 ENCOUNTER — Encounter: Payer: Self-pay | Admitting: "Endocrinology

## 2016-06-14 ENCOUNTER — Ambulatory Visit (INDEPENDENT_AMBULATORY_CARE_PROVIDER_SITE_OTHER): Payer: BLUE CROSS/BLUE SHIELD | Admitting: "Endocrinology

## 2016-06-14 ENCOUNTER — Other Ambulatory Visit: Payer: Self-pay

## 2016-06-14 VITALS — BP 124/84 | HR 80 | Ht 63.0 in | Wt 154.0 lb

## 2016-06-14 DIAGNOSIS — E782 Mixed hyperlipidemia: Secondary | ICD-10-CM | POA: Diagnosis not present

## 2016-06-14 DIAGNOSIS — E039 Hypothyroidism, unspecified: Secondary | ICD-10-CM | POA: Diagnosis not present

## 2016-06-14 DIAGNOSIS — I10 Essential (primary) hypertension: Secondary | ICD-10-CM

## 2016-06-14 DIAGNOSIS — E1159 Type 2 diabetes mellitus with other circulatory complications: Secondary | ICD-10-CM | POA: Diagnosis not present

## 2016-06-14 MED ORDER — LEVOTHYROXINE SODIUM 88 MCG PO TABS: ORAL_TABLET | ORAL | 3 refills | Status: DC

## 2016-06-14 MED ORDER — INSULIN ASPART 100 UNIT/ML FLEXPEN: 12.0000 [IU] | PEN_INJECTOR | Freq: Three times a day (TID) | SUBCUTANEOUS | 2 refills | Status: DC

## 2016-06-14 MED ORDER — LANCETS 30G MISC: 5 refills | Status: DC

## 2016-06-14 MED ORDER — GLUCOSE BLOOD VI STRP: ORAL_STRIP | 5 refills | Status: DC

## 2016-06-14 NOTE — Progress Notes (Signed)
Subjective:    Patient ID: Rachel [name], female    DOB: May 06, 1950, PCP Mikey Kirschner, MD   Past Medical History:  Diagnosis Date  . Asthma   . Coronary atherosclerosis of native coronary artery    DES x 2 (overlapping) RCA 03/2008 - patent 2011  . Diabetes type 2, controlled ([place]) 2000  . Essential hypertension, benign   . Fatty liver   . GERD (gastroesophageal reflux disease)   . Hyperlipidemia   . Microalbuminuria   . Obesity   . Odynophagia    Severe esophagitis on CT 05/2010, treated empirically with Diflucan  . PAD (peripheral artery disease) (HCC)    history of left greater than right lower 70 claudication  . Tubular adenoma of colon 07/2007   Multiple colonic polyps  . Venous stasis    Past Surgical History:  Procedure Laterality Date  . CARDIAC CATHETERIZATION     stents  . CHOLECYSTECTOMY N/A 07/30/2015   Procedure: LAPAROSCOPIC CHOLECYSTECTOMY;  Surgeon: Aviva Signs, MD;  Location: [facility];  Service: General;  Laterality: N/A;  . COLONOSCOPY W/ POLYPECTOMY  07/2007   Tubular adenoma  . CORONARY STENT PLACEMENT    . CYSTECTOMY     Pilonidal  . ESOPHAGOGASTRODUODENOSCOPY  04/2010   RMR: Normal esophagus , Stomach D1 and D2   . LOWER EXTREMITY ANGIOGRAPHY N/A 03/13/2016   Procedure: Lower Extremity Angiography;  Surgeon: [name], MD;  Location: [place] [facility];  Service: Cardiovascular;  Laterality: N/A;  . PERIPHERAL VASCULAR ATHERECTOMY  03/27/2016   Procedure: Peripheral Vascular Atherectomy;  Surgeon: [name], MD;  Location: [place] [facility];  Service: Cardiovascular;;  Attempted atherectomy  . PERIPHERAL VASCULAR CATHETERIZATION Bilateral 09/28/2014   Procedure: Lower Extremity Angiography;  Surgeon: [name], MD;  Location: [place] [facility];  Service: Cardiovascular;  Laterality: Bilateral;  . PERIPHERAL VASCULAR CATHETERIZATION N/A 09/28/2014   Procedure: Abdominal Aortogram;  Surgeon: [name], MD;  Location: [place] [facility];  Service: Cardiovascular;  Laterality: N/A;  . PERIPHERAL VASCULAR CATHETERIZATION N/A 10/15/2014   Procedure: Lower Extremity Angiography;  Surgeon: [name], MD;  Location: [place] [facility];  Service: Cardiovascular;  Laterality: N/A;  . PERIPHERAL VASCULAR CATHETERIZATION  10/15/2014   Procedure: Peripheral Vascular Intervention;  Surgeon: [name], MD;  Location: [place] [facility];  Service: Cardiovascular;;  bilateral iliac stents  . PILONIDAL CYST / SINUS EXCISION    . TUBAL LIGATION Bilateral    Social History   Social History  . Marital status: Divorced    Spouse name: N/A  . Number of children: 1  . 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   Social History Main Topics  . Smoking status: Current Every Day Smoker    Packs/day: 0.50    Years: 47.00    Types: Cigarettes  . Smokeless tobacco: Never Used  . Alcohol use No  . Drug use: No  . Sexual activity: Yes    Partners: Male    Birth control/ protection: Condom     Comment: mutual friends   Other Topics Concern  . None   Social History Narrative  . None   Outpatient Encounter Prescriptions as of 06/14/2016  Medication Sig  . aspirin EC 81 MG tablet Take 81 mg by mouth daily.    . blood glucose meter kit and supplies KIT Dispense based on patient and insurance preference. Use up  to four times daily as directed. (FOR ICD-10 E11.65) Bayer Contour  . Blood Glucose Monitoring Suppl (BAYER CONTOUR MONITOR) w/Device KIT Test BG 4 x daily. E11.65  . clopidogrel (PLAVIX) 75 MG tablet TAKE 1 TABLET (75 MG TOTAL) BY MOUTH DAILY.  Marland Kitchen gabapentin (NEURONTIN) 100 MG capsule Take 300 mg by mouth at bedtime.  Marland Kitchen glucose blood (BAYER CONTOUR TEST) test strip Use as instructed 4 x daily. Bayer contour. E11.65  . glucose blood (ONE TOUCH TEST STRIPS) test strip Use as instructed 4 x daily. e11.65  . hydrochlorothiazide (MICROZIDE) 12.5 MG capsule TAKE 1 CAPSULE (12.5  MG TOTAL) BY MOUTH DAILY.  Marland Kitchen HYDROcodone-acetaminophen (NORCO/VICODIN) 5-325 MG tablet Take 1 tablet by mouth every 6 (six) hours as needed. Use sparingly (Patient taking differently: Take 1 tablet by mouth every 6 (six) hours as needed for moderate pain. Use sparingly)  . insulin aspart (NOVOLOG) 100 UNIT/ML FlexPen Inject 12-18 Units into the skin 3 (three) times daily with meals.  . insulin glargine (LANTUS) 100 UNIT/ML injection Inject 0.4 mLs (40 Units total) into the skin at bedtime.  . Insulin Pen Needle (B-D ULTRAFINE III SHORT PEN) 31G X 8 MM MISC 1 each by Does not apply route as directed.  . Lancets MISC 1 each by Does not apply route as directed.  Marland Kitchen levothyroxine (SYNTHROID, LEVOTHROID) 88 MCG tablet TAKE 1 TABLET BY MOUTH DAILY BEFORE BREAKFAST  . meclizine (ANTIVERT) 25 MG tablet Take 25 mg by mouth as needed for dizziness.  Marland Kitchen oxybutynin (DITROPAN-XL) 10 MG 24 hr tablet Take 2 tablets (20 mg total) by mouth daily.  . pantoprazole (PROTONIX) 40 MG tablet TAKE 1 TABLET BY MOUTH EVERY DAY (Patient taking differently: TAKE 1 TABLET(40MG) BY MOUTH EVERY DAY)  . potassium chloride SA (KLOR-CON M20) 20 MEQ tablet Take 1 tablet (20 mEq total) by mouth 2 (two) times daily.  . rosuvastatin (CRESTOR) 20 MG tablet Take 1 tablet (20 mg total) by mouth at bedtime.  . [DISCONTINUED] hydrochlorothiazide (MICROZIDE) 12.5 MG capsule TAKE 1 CAPSULE (12.5 MG TOTAL) BY MOUTH DAILY.  . [DISCONTINUED] insulin aspart (NOVOLOG) 100 UNIT/ML FlexPen Inject 10-16 Units into the skin 3 (three) times daily with meals.  . [DISCONTINUED] levothyroxine (SYNTHROID, LEVOTHROID) 75 MCG tablet TAKE 1 TABLET BY MOUTH DAILY BEFORE BREAKFAST (Patient taking differently: Take 75 mcg by mouth daily before breakfast. )  . [DISCONTINUED] levothyroxine (SYNTHROID, LEVOTHROID) 75 MCG tablet TAKE 1 TABLET BY MOUTH DAILY BEFORE BREAKFAST   No facility-administered encounter medications on file as of 06/14/2016.     ALLERGIES: Allergies  Allergen Reactions  . Altace [Ramipril] Cough  . Biaxin [Clarithromycin]     Fatigue  . Niacin And Related Other (See Comments)    Flush - felt like on fire.  . Nsaids Other (See Comments)    feverish  . Adhesive [Tape] Rash    Paper tape is ok   VACCINATION STATUS: Immunization History  Administered Date(s) Administered  . Influenza,inj,Quad PF,36+ Mos 10/18/2015  . Influenza-Unspecified 11/07/2007, 12/07/2013  . Pneumococcal Polysaccharide-23 12/06/2006    Diabetes  She presents for her follow-up diabetic visit. She has type 2 diabetes mellitus. Onset time: She was diagnosed at approximate age of 27 years. Her disease course has been improving. There are no hypoglycemic associated symptoms. Pertinent negatives for hypoglycemia include no confusion, headaches, pallor or seizures. Associated symptoms include blurred vision, fatigue, foot paresthesias, foot ulcerations, polydipsia and polyuria. Pertinent negatives for diabetes include no chest pain and no polyphagia. There are no  hypoglycemic complications. Symptoms are improving. Diabetic complications include autonomic neuropathy, nephropathy, PVD and retinopathy. Risk factors for coronary artery disease include diabetes mellitus, dyslipidemia, hypertension, sedentary lifestyle and tobacco exposure. Current diabetic treatments: She says she is on Lantus 82 units nightly, NovoLog to 10 units 3 times a day before meals, metformin 500 mg by mouth twice a day. She is compliant with treatment none of the time. Her weight is increasing steadily. She is following a generally unhealthy diet. When asked about meal planning, she reported none. She has not had a previous visit with a dietitian. She never participates in exercise. Blood glucose monitoring compliance is adequate. Home blood sugar record trend: She comes in with no meter nor logs to review. Her breakfast blood glucose range is generally 180-200 mg/dl. Her lunch  blood glucose range is generally 180-200 mg/dl. Her dinner blood glucose range is generally 180-200 mg/dl. Her overall blood glucose range is 180-200 mg/dl. An ACE inhibitor/angiotensin II receptor blocker is contraindicated. She sees a podiatrist.Eye exam is current.  Hyperlipidemia  This is a chronic problem. The current episode started more than 1 year ago. The problem is uncontrolled. Recent lipid tests were reviewed and are variable. Pertinent negatives include no chest pain, myalgias or shortness of breath. Current antihyperlipidemic treatment includes statins. Risk factors for coronary artery disease include dyslipidemia, family history, hypertension, diabetes mellitus and a sedentary lifestyle.  Hypertension  Associated symptoms include blurred vision. Pertinent negatives include no chest pain, headaches, palpitations or shortness of breath. Risk factors for coronary artery disease include dyslipidemia, diabetes mellitus, smoking/tobacco exposure and sedentary lifestyle. Hypertensive end-organ damage includes PVD and retinopathy.     Review of Systems  Constitutional: Positive for fatigue and unexpected weight change. Negative for chills and fever.  HENT: Negative for trouble swallowing and voice change.   Eyes: Positive for blurred vision. Negative for visual disturbance.  Respiratory: Negative for cough, shortness of breath and wheezing.   Cardiovascular: Negative for chest pain, palpitations and leg swelling.  Gastrointestinal: Negative for diarrhea, nausea and vomiting.  Endocrine: Positive for polydipsia and polyuria. Negative for cold intolerance, heat intolerance and polyphagia.  Musculoskeletal: Positive for gait problem. Negative for arthralgias and myalgias.  Skin: Negative for color change, pallor, rash and wound.  Neurological: Negative for seizures and headaches.  Psychiatric/Behavioral: Negative for confusion and suicidal ideas.    Objective:    BP 124/84   Pulse 80    Ht 5' 3"  (1.6 m)   Wt 154 lb (69.9 kg)   BMI 27.28 kg/m   Wt Readings from Last 3 Encounters:  06/14/16 154 lb (69.9 kg)  05/11/16 156 lb 12.8 oz (71.1 kg)  04/14/16 150 lb (68 kg)    Physical Exam  Constitutional: She is oriented to person, place, and time. She appears well-developed.  HENT:  Head: Normocephalic and atraumatic.  Eyes: EOM are normal.  Neck: Normal range of motion. Neck supple. No tracheal deviation present. No thyromegaly present.  Cardiovascular: Normal rate and regular rhythm.   Pulses:      Dorsalis pedis pulses are 0 on the right side, and 0 on the left side.       Posterior tibial pulses are 0 on the right side, and 0 on the left side.  Pulmonary/Chest: Effort normal and breath sounds normal.  Abdominal: Soft. Bowel sounds are normal. There is no tenderness. There is no guarding.  Musculoskeletal: Normal range of motion. She exhibits no edema.  Neurological: She is alert and oriented to person,  place, and time. She has normal reflexes. No cranial nerve deficit. Coordination normal.  Skin: Skin is warm and dry. No rash noted. No erythema. No pallor.  Psychiatric: She has a normal mood and affect. Judgment normal.    CMP     Component Value Date/Time   NA 136 06/07/2016 0852   K 4.9 06/07/2016 0852   CL 97 06/07/2016 0852   CO2 22 06/07/2016 0852   GLUCOSE 176 (H) 06/07/2016 0852   GLUCOSE 215 (H) 03/21/2016 1229   BUN 17 06/07/2016 0852   CREATININE 1.21 (H) 06/07/2016 0852   CREATININE 1.05 (H) 03/21/2016 1229   CALCIUM 9.9 06/07/2016 0852   PROT 7.7 06/07/2016 0852   ALBUMIN 4.3 06/07/2016 0852   AST 15 06/07/2016 0852   ALT 11 06/07/2016 0852   ALKPHOS 165 (H) 06/07/2016 0852   BILITOT <0.2 06/07/2016 0852   GFRNONAA 48 (L) 06/07/2016 0852   GFRNONAA 57 (L) 03/21/2016 1229   GFRAA 55 (L) 06/07/2016 0852   GFRAA 65 03/21/2016 1229     Diabetic Labs (most recent): Lab Results  Component Value Date   HGBA1C 9.1 (H) 06/07/2016   HGBA1C  10.0 03/03/2016   HGBA1C 10.3 10/18/2015     Lipid Panel ( most recent) Lipid Panel     Component Value Date/Time   CHOL 167 06/07/2016 0852   TRIG 182 (H) 06/07/2016 0852   HDL 30 (L) 06/07/2016 0852   CHOLHDL 5.8 (H) 04/17/2015 0908   CHOLHDL 3.6 04/18/2014 0833   VLDL 23 04/18/2014 0833   LDLCALC 101 (H) 06/07/2016 0852      Assessment & Plan:   1. DM type 2 causing vascular disease ([place]), Peripheral arterial disease which required bypass graft, retinopathy, nephropathy  - Her diabetes is  also complicated by alarming noncompliance/nonadherence, she disappeared from care for 3 years, and patient remains at a high risk for more acute and chronic complications of diabetes which include CAD, CVA, CKD, retinopathy, and neuropathy. These are all discussed in detail with the patient.  Patient came with Improving glucose profile since her last visit. Her A1c is improving to 9.1% from 10%.    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  - I have re-counseled the patient on diet management and   by adopting a carbohydrate restricted / protein rich  Diet.  - Suggestion is made for patient to avoid simple carbohydrates   from their diet including Cakes , Desserts, Ice Cream,  Soda (  diet and regular) , Sweet Tea , Candies,  Chips, Cookies, Artificial Sweeteners,   and "Sugar-free" Products .  This will help patient to have stable blood glucose profile and potentially avoid unintended  Weight gain.  - Patient is advised to stick to a routine mealtimes to eat 3 meals  a day and avoid unnecessary snacks ( to snack only to correct hypoglycemia).  - The patient  Will be  scheduled with Jearld Fenton, RDN, CDE for individualized DM education.  - I have approached patient with the following individualized plan to manage diabetes and patient agrees.  - She came with better commitment and monitor blood glucose before meals and at bedtime averaging 181 for the last 7 days of 23  readings. - I urged her to continue monitoring of blood glucose 4 times a day-3 times a day before meals and daily at bedtime.  - I will continue Lantus  40 units daily at bedtime, increase  NovoLog  To 12 units 3 times  a day before meals for pre-meal BG readings of 90-142m/dl, plus patient specific correction dose of rapid acting insulin  for unexpected hyperglycemia above 1572mdl, associated with strict monitoring of glucose  AC and HS. - Patient is warned not to take insulin without proper monitoring per orders. -Adjustment parameters are given for hypo and hyperglycemia in writing. -Patient is encouraged to call clinic for blood glucose levels less than 70 or above 300 mg /dl.  - At this time, patient is not a candidate for metformin, SGLT2 inhibitors due to CKD and severe peripheral arterial disease which required bypass surgery. - I advised her to discontinue metformin. - Insulin is exclusive choice of therapy for her diabetes at this time. - Patient specific target  for A1c; LDL, HDL, Triglycerides, and  Waist Circumference were discussed in detail.  2) BP/HTN: Controlled. Continue current medications, she is allergic to ACE inhibitors. 3) Lipids/HPL: Uncontrolled with LDL 110.  Patient is advised to continue statins. 4)  Weight/Diet:  he has lost significant weight since her last visit, CDE consult will be initiated, and carbohydrates information provided.  5) hypothyroidism: Her thyroid function tests are consistent with suboptimal replacement. I will increase her levothyroxine to 88 g by mouth every morning.  - We discussed about correct intake of levothyroxine, at fasting, with water, separated by at least 30 minutes from breakfast, and separated by more than 4 hours from calcium, iron, multivitamins, acid reflux medications (PPIs). -Patient is made aware of the fact that thyroid hormone replacement is needed for life, dose to be adjusted by periodic monitoring of thyroid function  tests.  6) Chronic Care/Health Maintenance:  -Patient is Statin medications and encouraged to continue to follow up with Ophthalmology, Podiatrist at least yearly or according to recommendations, and advised to quit smoking. I have recommended yearly flu vaccine and pneumonia vaccination at least every 5 years; moderate intensity exercise for up to 150 minutes weekly; and  sleep for at least 7 hours a day.  - 25 minutes of time was spent on the care of this patient , 50% of which was applied for counseling on diabetes complications and their preventions.  - I advised patient to maintain close follow up with LuMikey KirschnerMD for primary care needs.  Patient is asked to bring meter and  blood glucose logs during  next visit.   Follow up plan: -Return in about 3 months (around 09/14/2016) for follow up with pre-visit labs, meter, and logs.  GeGlade LloydMD Phone: [phone]: [phone] 06/14/2016, 9:23 AM

## 2016-06-14 NOTE — Patient Instructions (Signed)

## 2016-06-20 ENCOUNTER — Other Ambulatory Visit: Payer: Self-pay

## 2016-06-20 MED ORDER — INSULIN GLARGINE 100 UNIT/ML ~~LOC~~ SOLN: 40.0000 [IU] | Freq: Every day | SUBCUTANEOUS | 1 refills | Status: DC

## 2016-07-08 ENCOUNTER — Other Ambulatory Visit: Payer: Self-pay | Admitting: Family Medicine

## 2016-07-09 ENCOUNTER — Other Ambulatory Visit: Payer: Self-pay | Admitting: Family Medicine

## 2016-07-10 ENCOUNTER — Other Ambulatory Visit: Payer: Self-pay | Admitting: Family Medicine

## 2016-07-25 ENCOUNTER — Other Ambulatory Visit: Payer: Self-pay

## 2016-07-25 MED ORDER — LEVOTHYROXINE SODIUM 88 MCG PO TABS: ORAL_TABLET | ORAL | 0 refills | Status: DC

## 2016-08-03 ENCOUNTER — Other Ambulatory Visit: Payer: Self-pay | Admitting: Family Medicine

## 2016-08-08 ENCOUNTER — Other Ambulatory Visit: Payer: Self-pay

## 2016-08-08 MED ORDER — INSULIN ASPART 100 UNIT/ML FLEXPEN: 12.0000 [IU] | PEN_INJECTOR | Freq: Three times a day (TID) | SUBCUTANEOUS | 0 refills | Status: DC

## 2016-08-10 ENCOUNTER — Other Ambulatory Visit: Payer: Self-pay

## 2016-08-10 MED ORDER — INSULIN ASPART 100 UNIT/ML FLEXPEN: 12.0000 [IU] | PEN_INJECTOR | Freq: Three times a day (TID) | SUBCUTANEOUS | 0 refills | Status: DC

## 2016-09-15 ENCOUNTER — Ambulatory Visit: Payer: BLUE CROSS/BLUE SHIELD | Admitting: "Endocrinology

## 2016-09-30 LAB — CMP14+EGFR
A/G RATIO: 1.1 — AB (ref 1.2–2.2)
ALBUMIN: 3.9 g/dL (ref 3.6–4.8)
ALT: 7 IU/L (ref 0–32)
AST: 17 IU/L (ref 0–40)
Alkaline Phosphatase: 157 IU/L — ABNORMAL HIGH (ref 39–117)
BUN / CREAT RATIO: 13 (ref 12–28)
BUN: 15 mg/dL (ref 8–27)
Bilirubin Total: <0.2 mg/dL (ref 0.0–1.2)
CALCIUM: 9.5 mg/dL (ref 8.7–10.3)
CO2: 23 mmol/L (ref 20–29)
CREATININE: 1.12 mg/dL — AB (ref 0.57–1.00)
Chloride: 100 mmol/L (ref 96–106)
GFR calc Af Amer: 60 mL/min/{1.73_m2} (ref >59)
GFR, EST NON AFRICAN AMERICAN: 52 mL/min/{1.73_m2} — AB (ref >59)
GLOBULIN, TOTAL: 3.4 g/dL (ref 1.5–4.5)
Glucose: 222 mg/dL — ABNORMAL HIGH (ref 65–99)
POTASSIUM: 4.8 mmol/L (ref 3.5–5.2)
SODIUM: 135 mmol/L (ref 134–144)
Total Protein: 7.3 g/dL (ref 6.0–8.5)

## 2016-09-30 LAB — TSH: TSH: 8.63 u[IU]/mL — ABNORMAL HIGH (ref 0.450–4.500)

## 2016-09-30 LAB — T4, FREE: Free T4: 1.19 ng/dL (ref 0.82–1.77)

## 2016-10-06 ENCOUNTER — Ambulatory Visit (INDEPENDENT_AMBULATORY_CARE_PROVIDER_SITE_OTHER): Payer: BLUE CROSS/BLUE SHIELD | Admitting: "Endocrinology

## 2016-10-06 ENCOUNTER — Encounter: Payer: Self-pay | Admitting: "Endocrinology

## 2016-10-06 VITALS — BP 139/87 | HR 89 | Ht 63.0 in | Wt 162.0 lb

## 2016-10-06 DIAGNOSIS — E1159 Type 2 diabetes mellitus with other circulatory complications: Secondary | ICD-10-CM

## 2016-10-06 DIAGNOSIS — E039 Hypothyroidism, unspecified: Secondary | ICD-10-CM | POA: Diagnosis not present

## 2016-10-06 DIAGNOSIS — I10 Essential (primary) hypertension: Secondary | ICD-10-CM

## 2016-10-06 DIAGNOSIS — E782 Mixed hyperlipidemia: Secondary | ICD-10-CM

## 2016-10-06 MED ORDER — LEVOTHYROXINE SODIUM 100 MCG PO TABS: ORAL_TABLET | ORAL | 3 refills | Status: DC

## 2016-10-06 MED ORDER — FREESTYLE LIBRE READER DEVI: 1.0000 | Freq: Once | 0 refills | Status: AC

## 2016-10-06 MED ORDER — FREESTYLE LIBRE SENSOR SYSTEM MISC: 2 refills | Status: DC

## 2016-10-06 NOTE — Patient Instructions (Signed)

## 2016-10-06 NOTE — Progress Notes (Signed)
Subjective:    Patient ID: Rachel [name], female    DOB: August 08, 1950, PCP Mikey Kirschner, MD   Past Medical History:  Diagnosis Date  . Asthma   . Coronary atherosclerosis of native coronary artery    DES x 2 (overlapping) RCA 03/2008 - patent 2011  . Diabetes type 2, controlled ([place]) 2000  . Essential hypertension, benign   . Fatty liver   . GERD (gastroesophageal reflux disease)   . Hyperlipidemia   . Microalbuminuria   . Obesity   . Odynophagia    Severe esophagitis on CT 05/2010, treated empirically with Diflucan  . PAD (peripheral artery disease) (HCC)    history of left greater than right lower 70 claudication  . Tubular adenoma of colon 07/2007   Multiple colonic polyps  . Venous stasis    Past Surgical History:  Procedure Laterality Date  . CARDIAC CATHETERIZATION     stents  . CHOLECYSTECTOMY N/A 07/30/2015   Procedure: LAPAROSCOPIC CHOLECYSTECTOMY;  Surgeon: Aviva Signs, MD;  Location: [facility];  Service: General;  Laterality: N/A;  . COLONOSCOPY W/ POLYPECTOMY  07/2007   Tubular adenoma  . CORONARY STENT PLACEMENT    . CYSTECTOMY     Pilonidal  . ESOPHAGOGASTRODUODENOSCOPY  04/2010   RMR: Normal esophagus , Stomach D1 and D2   . LOWER EXTREMITY ANGIOGRAPHY N/A 03/13/2016   Procedure: Lower Extremity Angiography;  Surgeon: [name], MD;  Location: [facility];  Service: Cardiovascular;  Laterality: N/A;  . PERIPHERAL VASCULAR ATHERECTOMY  03/27/2016   Procedure: Peripheral Vascular Atherectomy;  Surgeon: [name], MD;  Location: [facility];  Service: Cardiovascular;;  Attempted atherectomy  . PERIPHERAL VASCULAR CATHETERIZATION Bilateral 09/28/2014   Procedure: Lower Extremity Angiography;  Surgeon: [name], MD;  Location: [facility];  Service: Cardiovascular;  Laterality: Bilateral;  . PERIPHERAL VASCULAR CATHETERIZATION N/A 09/28/2014   Procedure: Abdominal Aortogram;  Surgeon: [name], MD;  Location: [facility];  Service: Cardiovascular;  Laterality: N/A;  . PERIPHERAL VASCULAR CATHETERIZATION N/A 10/15/2014   Procedure: Lower Extremity Angiography;  Surgeon: [name], MD;  Location: [facility];  Service: Cardiovascular;  Laterality: N/A;  . PERIPHERAL VASCULAR CATHETERIZATION  10/15/2014   Procedure: Peripheral Vascular Intervention;  Surgeon: [name], MD;  Location: [facility];  Service: Cardiovascular;;  bilateral iliac stents  . PILONIDAL CYST / SINUS EXCISION    . TUBAL LIGATION Bilateral    Social History   Social History  . Marital status: Divorced    Spouse name: N/A  . Number of children: 1  . 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   Social History Main Topics  . Smoking status: Current Every Day Smoker    Packs/day: 0.50    Years: 47.00    Types: Cigarettes  . Smokeless tobacco: Never Used  . Alcohol use No  . Drug use: No  . Sexual activity: Yes    Partners: Male    Birth control/ protection: Condom     Comment: mutual friends   Other Topics Concern  . None   Social History Narrative  . None   Outpatient Encounter Prescriptions as of 10/06/2016  Medication Sig  . aspirin EC 81 MG tablet Take 81 mg by mouth daily.    . blood glucose meter kit and supplies KIT Dispense based on patient and insurance preference. Use up  to four times daily as directed. (FOR ICD-10 E11.65) Bayer Contour  . Blood Glucose Monitoring Suppl (BAYER CONTOUR MONITOR) w/Device KIT Test BG 4 x daily. E11.65  . clopidogrel (PLAVIX) 75 MG tablet TAKE 1 TABLET (75 MG TOTAL) BY MOUTH DAILY.  Marland Kitchen Continuous Blood Gluc Receiver (FREESTYLE LIBRE READER) DEVI 1 Piece by Does not apply route once.  . Continuous Blood Gluc Sensor (FREESTYLE LIBRE SENSOR SYSTEM) MISC Use one sensor every 10 days.  Marland Kitchen gabapentin (NEURONTIN) 100 MG capsule Take 300 mg by mouth at bedtime.  Marland Kitchen glucose blood (BAYER CONTOUR NEXT TEST) test strip Use  as directed 4 x daily. E11.65  . hydrochlorothiazide (MICROZIDE) 12.5 MG capsule TAKE 1 CAPSULE (12.5 MG TOTAL) BY MOUTH DAILY.  Marland Kitchen HYDROcodone-acetaminophen (NORCO/VICODIN) 5-325 MG tablet Take 1 tablet by mouth every 6 (six) hours as needed. Use sparingly (Patient taking differently: Take 1 tablet by mouth every 6 (six) hours as needed for moderate pain. Use sparingly)  . insulin aspart (NOVOLOG) 100 UNIT/ML FlexPen Inject 12-18 Units into the skin 3 (three) times daily with meals.  . insulin glargine (LANTUS) 100 UNIT/ML injection Inject 0.4 mLs (40 Units total) into the skin at bedtime.  . Insulin Pen Needle (B-D ULTRAFINE III SHORT PEN) 31G X 8 MM MISC 1 each by Does not apply route as directed.  . Lancets 30G MISC Test 4 x daily. E11.65. Bayer contour next  . LANTUS SOLOSTAR 100 UNIT/ML Solostar Pen INJECT 50 UNITS INTO THE SKIN AT BEDTIME  . levothyroxine (SYNTHROID, LEVOTHROID) 100 MCG tablet TAKE 1 TABLET BY MOUTH DAILY BEFORE BREAKFAST  . meclizine (ANTIVERT) 25 MG tablet Take 25 mg by mouth as needed for dizziness.  Marland Kitchen oxybutynin (DITROPAN-XL) 10 MG 24 hr tablet Take 2 tablets (20 mg total) by mouth daily.  . pantoprazole (PROTONIX) 40 MG tablet TAKE 1 TABLET BY MOUTH EVERY DAY (Patient taking differently: TAKE 1 TABLET(40MG) BY MOUTH EVERY DAY)  . potassium chloride SA (KLOR-CON M20) 20 MEQ tablet Take 1 tablet (20 mEq total) by mouth 2 (two) times daily.  . rosuvastatin (CRESTOR) 20 MG tablet TAKE 1 TABLET (20 MG TOTAL) BY MOUTH AT BEDTIME.  . [DISCONTINUED] levothyroxine (SYNTHROID, LEVOTHROID) 88 MCG tablet TAKE 1 TABLET BY MOUTH DAILY BEFORE BREAKFAST   No facility-administered encounter medications on file as of 10/06/2016.    ALLERGIES: Allergies  Allergen Reactions  . Altace [Ramipril] Cough  . Biaxin [Clarithromycin]     Fatigue  . Niacin And Related Other (See Comments)    Flush - felt like on fire.  . Nsaids Other (See Comments)    feverish  . Adhesive [Tape] Rash     Paper tape is ok   VACCINATION STATUS: Immunization History  Administered Date(s) Administered  . Influenza,inj,Quad PF,6+ Mos 10/18/2015  . Influenza-Unspecified 11/07/2007, 12/07/2013  . Pneumococcal Polysaccharide-23 12/06/2006    Diabetes  She presents for her follow-up diabetic visit. She has type 2 diabetes mellitus. Onset time: She was diagnosed at approximate age of 82 years. Her disease course has been improving. There are no hypoglycemic associated symptoms. Pertinent negatives for hypoglycemia include no confusion, headaches, pallor or seizures. Associated symptoms include blurred vision, fatigue, foot paresthesias and foot ulcerations. Pertinent negatives for diabetes include no chest pain, no polydipsia, no polyphagia and no polyuria. There are no hypoglycemic complications. Symptoms are improving. Diabetic complications include autonomic neuropathy, nephropathy, PVD and retinopathy. Risk factors for coronary artery disease include diabetes mellitus, dyslipidemia, hypertension, sedentary lifestyle and tobacco exposure. Current diabetic  treatments: She says she is on Lantus 82 units nightly, NovoLog to 10 units 3 times a day before meals, metformin 500 mg by mouth twice a day. She is compliant with treatment none of the time. Her weight is increasing steadily. She is following a generally unhealthy diet. When asked about meal planning, she reported none. She has not had a previous visit with a dietitian. She never participates in exercise. Blood glucose monitoring compliance is adequate. Home blood sugar record trend: She comes in with no meter nor logs to review. Her breakfast blood glucose range is generally 140-180 mg/dl. Her lunch blood glucose range is generally 140-180 mg/dl. Her dinner blood glucose range is generally 140-180 mg/dl. Her overall blood glucose range is 140-180 mg/dl. An ACE inhibitor/angiotensin II receptor blocker is contraindicated. She sees a podiatrist.Eye exam is  current.  Hyperlipidemia  This is a chronic problem. The current episode started more than 1 year ago. The problem is uncontrolled. Recent lipid tests were reviewed and are variable. Pertinent negatives include no chest pain, myalgias or shortness of breath. Current antihyperlipidemic treatment includes statins. Risk factors for coronary artery disease include dyslipidemia, family history, hypertension, diabetes mellitus and a sedentary lifestyle.  Hypertension  Associated symptoms include blurred vision. Pertinent negatives include no chest pain, headaches, palpitations or shortness of breath. Risk factors for coronary artery disease include dyslipidemia, diabetes mellitus, smoking/tobacco exposure and sedentary lifestyle. Hypertensive end-organ damage includes PVD and retinopathy.     Review of Systems  Constitutional: Positive for fatigue and unexpected weight change. Negative for chills and fever.  HENT: Negative for trouble swallowing and voice change.   Eyes: Positive for blurred vision. Negative for visual disturbance.  Respiratory: Negative for cough, shortness of breath and wheezing.   Cardiovascular: Negative for chest pain, palpitations and leg swelling.  Gastrointestinal: Negative for diarrhea, nausea and vomiting.  Endocrine: Negative for cold intolerance, heat intolerance, polydipsia, polyphagia and polyuria.  Musculoskeletal: Positive for gait problem. Negative for arthralgias and myalgias.  Skin: Negative for color change, pallor, rash and wound.  Neurological: Negative for seizures and headaches.  Psychiatric/Behavioral: Negative for confusion and suicidal ideas.    Objective:    BP 139/87   Pulse 89   Ht 5' 3"  (1.6 m)   Wt 162 lb (73.5 kg)   BMI 28.70 kg/m   Wt Readings from Last 3 Encounters:  10/06/16 162 lb (73.5 kg)  06/14/16 154 lb (69.9 kg)  05/11/16 156 lb 12.8 oz (71.1 kg)    Physical Exam  Constitutional: She is oriented to person, place, and time. She  appears well-developed.  HENT:  Head: Normocephalic and atraumatic.  Eyes: EOM are normal.  Neck: Normal range of motion. Neck supple. No tracheal deviation present. No thyromegaly present.  Cardiovascular: Normal rate and regular rhythm.   Pulses:      Dorsalis pedis pulses are 0 on the right side, and 0 on the left side.       Posterior tibial pulses are 0 on the right side, and 0 on the left side.  Pulmonary/Chest: Effort normal and breath sounds normal.  Abdominal: Soft. Bowel sounds are normal. There is no tenderness. There is no guarding.  Musculoskeletal: Normal range of motion. She exhibits no edema.  Neurological: She is alert and oriented to person, place, and time. She has normal reflexes. No cranial nerve deficit. Coordination normal.  Skin: Skin is warm and dry. No rash noted. No erythema. No pallor.  Psychiatric: She has a normal mood and  affect. Judgment normal.    CMP     Component Value Date/Time   NA 135 09/29/2016 0824   K 4.8 09/29/2016 0824   CL 100 09/29/2016 0824   CO2 23 09/29/2016 0824   GLUCOSE 222 (H) 09/29/2016 0824   GLUCOSE 215 (H) 03/21/2016 1229   BUN 15 09/29/2016 0824   CREATININE 1.12 (H) 09/29/2016 0824   CREATININE 1.05 (H) 03/21/2016 1229   CALCIUM 9.5 09/29/2016 0824   PROT 7.3 09/29/2016 0824   ALBUMIN 3.9 09/29/2016 0824   AST 17 09/29/2016 0824   ALT 7 09/29/2016 0824   ALKPHOS 157 (H) 09/29/2016 0824   BILITOT <0.2 09/29/2016 0824   GFRNONAA 52 (L) 09/29/2016 0824   GFRNONAA 57 (L) 03/21/2016 1229   GFRAA 60 09/29/2016 0824   GFRAA 65 03/21/2016 1229     Diabetic Labs (most recent): Lab Results  Component Value Date   HGBA1C 9.1 (H) 06/07/2016   HGBA1C 10.0 03/03/2016   HGBA1C 10.3 10/18/2015     Lipid Panel ( most recent) Lipid Panel     Component Value Date/Time   CHOL 167 06/07/2016 0852   TRIG 182 (H) 06/07/2016 0852   HDL 30 (L) 06/07/2016 0852   CHOLHDL 5.8 (H) 04/17/2015 0908   CHOLHDL 3.6 04/18/2014 0833    VLDL 23 04/18/2014 0833   LDLCALC 101 (H) 06/07/2016 0852      Assessment & Plan:   1. DM type 2 causing vascular disease ([place]), Peripheral arterial disease which required bypass graft, retinopathy, nephropathy  -  She has hx of  alarming noncompliance/nonadherence, she disappeared from care for 3 years, and patient remains at a high risk for more acute and chronic complications of diabetes which include CAD, CVA, CKD, retinopathy, and neuropathy. These are all discussed in detail with the patient.  Patient came with Improving glucose profile since her last visit. - Her recent labs did not include A1c, her A1c was 9.1% from May 2018.   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  - I have re-counseled the patient on diet management and   by adopting a carbohydrate restricted / protein rich  Diet.  - Suggestion is made for patient to avoid simple carbohydrates   from their diet including Cakes , Desserts, Ice Cream,  Soda (  diet and regular) , Sweet Tea , Candies,  Chips, Cookies, Artificial Sweeteners,   and "Sugar-free" Products .  This will help patient to have stable blood glucose profile and potentially avoid unintended  Weight gain.  - Patient is advised to stick to a routine mealtimes to eat 3 meals  a day and avoid unnecessary snacks ( to snack only to correct hypoglycemia).   - I have approached patient with the following individualized plan to manage diabetes and patient agrees.  - She came with better commitment and monitor blood glucose before meals and at bedtime averaging 175 for the last 7 days . - I urged her to continue monitoring of blood glucose 4 times a day-3 times a day before meals and daily at bedtime.  - I will continue Lantus  40 units daily at bedtime, increase  NovoLog  to 14 units 3 times a day before meals for pre-meal BG readings of 90-143m/dl, plus patient specific correction dose of rapid acting insulin  for unexpected hyperglycemia above  1511mdl, associated with strict monitoring of glucose  4 times a day. - She will benefit from continuous glucose monitoring. I discussed and initiated prescription for  the [place] device for her. - Patient is warned not to take insulin without proper monitoring per orders. -Adjustment parameters are given for hypo and hyperglycemia in writing. -Patient is encouraged to call clinic for blood glucose levels less than 70 or above 300 mg /dl.  - At this time, patient is not a candidate for metformin, SGLT2 inhibitors due to CKD and severe peripheral arterial disease which required bypass surgery. - Insulin is exclusive choice of therapy for her diabetes at this time. - Patient specific target  for A1c; LDL, HDL, Triglycerides, and  Waist Circumference were discussed in detail.  2) BP/HTN: Controlled. Continue current medications, she is allergic to ACE inhibitors. 3) Lipids/HPL: Uncontrolled with LDL 110.  Patient is advised to continue statins. 4)  Weight/Diet:  he has lost significant weight since her last visit, CDE consult will be initiated, and carbohydrates information provided.  5) hypothyroidism: Her thyroid function tests are consistent with suboptimal replacement. I will increase her levothyroxine to 100 g by mouth every morning.  - We discussed about correct intake of levothyroxine, at fasting, with water, separated by at least 30 minutes from breakfast, and separated by more than 4 hours from calcium, iron, multivitamins, acid reflux medications (PPIs). -Patient is made aware of the fact that thyroid hormone replacement is needed for life, dose to be adjusted by periodic monitoring of thyroid function tests.  6) Chronic Care/Health Maintenance:  -Patient is Statin medications and encouraged to continue to follow up with Ophthalmology, Podiatrist at least yearly or according to recommendations, and advised to quit smoking. I have recommended yearly flu vaccine and pneumonia vaccination at  least every 5 years; moderate intensity exercise for up to 150 minutes weekly; and  sleep for at least 7 hours a day.  - Time spent with the patient: 25 min, of which >50% was spent in reviewing her sugar logs , discussing her hypo- and hyper-glycemic episodes, reviewing her current and  previous labs and insulin doses and developing a plan to avoid hypo- and hyper-glycemia.   - I advised patient to maintain close follow up with Mikey Kirschner, MD for primary care needs.  Patient is asked to bring meter and  blood glucose logs during  next visit.   Follow up plan: -Return in about 3 months (around 01/05/2017) for follow up with pre-visit labs, meter, and logs.  Glade Lloyd, MD Phone: [phone]  Fax: [phone]  This note was partially dictated with voice recognition software. Similar sounding words can be transcribed inadequately or may not  be corrected upon review.  10/06/2016, 12:01 PM

## 2016-10-11 ENCOUNTER — Other Ambulatory Visit: Payer: Self-pay | Admitting: Family Medicine

## 2016-10-17 ENCOUNTER — Other Ambulatory Visit: Payer: Self-pay | Admitting: Cardiovascular Disease

## 2016-10-17 ENCOUNTER — Encounter (HOSPITAL_COMMUNITY): Payer: BLUE CROSS/BLUE SHIELD

## 2016-10-17 DIAGNOSIS — I739 Peripheral vascular disease, unspecified: Secondary | ICD-10-CM

## 2016-10-18 ENCOUNTER — Ambulatory Visit (HOSPITAL_COMMUNITY)
Admission: RE | Admit: 2016-10-18 | Discharge: 2016-10-18 | Disposition: A | Discharge status: Home / Self Care | Payer: BLUE CROSS/BLUE SHIELD | Source: Ambulatory Visit | Attending: Cardiovascular Disease | Admitting: Cardiovascular Disease

## 2016-10-18 DIAGNOSIS — R938 Abnormal findings on diagnostic imaging of other specified body structures: Secondary | ICD-10-CM | POA: Diagnosis not present

## 2016-10-18 DIAGNOSIS — I700 Atherosclerosis of aorta: Secondary | ICD-10-CM | POA: Diagnosis not present

## 2016-10-18 DIAGNOSIS — I739 Peripheral vascular disease, unspecified: Secondary | ICD-10-CM

## 2016-10-18 DIAGNOSIS — Z720 Tobacco use: Secondary | ICD-10-CM | POA: Insufficient documentation

## 2016-10-18 DIAGNOSIS — I10 Essential (primary) hypertension: Secondary | ICD-10-CM | POA: Diagnosis not present

## 2016-10-18 DIAGNOSIS — I708 Atherosclerosis of other arteries: Secondary | ICD-10-CM | POA: Insufficient documentation

## 2016-10-18 DIAGNOSIS — E1151 Type 2 diabetes mellitus with diabetic peripheral angiopathy without gangrene: Secondary | ICD-10-CM | POA: Diagnosis not present

## 2016-10-18 DIAGNOSIS — I2510 Atherosclerotic heart disease of native coronary artery without angina pectoris: Secondary | ICD-10-CM | POA: Insufficient documentation

## 2016-10-18 DIAGNOSIS — E785 Hyperlipidemia, unspecified: Secondary | ICD-10-CM | POA: Diagnosis not present

## 2016-10-23 ENCOUNTER — Telehealth: Payer: Self-pay | Admitting: Cardiovascular Disease

## 2016-10-23 ENCOUNTER — Other Ambulatory Visit: Payer: Self-pay | Admitting: Cardiovascular Disease

## 2016-10-23 DIAGNOSIS — I739 Peripheral vascular disease, unspecified: Secondary | ICD-10-CM

## 2016-10-23 NOTE — Telephone Encounter (Signed)
VAS US AORTA/IVC/ILIACS  Order: [id number]  Status:  Final result Visible to patient:  No (Not Released) Dx:  Claudication ([place]); Peripheral arteri...  Notes recorded by Therisa Doyne on 10/23/2016 at 4:25 PM EDT Repeat order entered. Left detail message with results, ok per DPR, and to call back if any questions.   ------  Notes recorded by [name], MD on 10/20/2016 at 8:02 AM EDT Patent iliac stents. Repeat 1 year

## 2016-10-24 ENCOUNTER — Telehealth: Payer: Self-pay | Admitting: Cardiovascular Disease

## 2016-10-24 NOTE — Telephone Encounter (Signed)
Mrs.Frysinger is returning your call

## 2016-10-25 NOTE — Telephone Encounter (Signed)
Notes recorded by [name], MD on 10/20/2016 at 8:02 AM EDT Patent iliac stents. Repeat 1 year  S/w pt she states that she was having this done to see what  To do next about her blockage this is not a annual study per pt.what do we do now?

## 2016-10-25 NOTE — Telephone Encounter (Signed)
Lm2cb 

## 2016-10-25 NOTE — Telephone Encounter (Signed)
She said she receeived her test results from her doppler,but she still have some questions.

## 2016-10-25 NOTE — Telephone Encounter (Signed)
Needs ROV to discuss 

## 2016-10-30 NOTE — Telephone Encounter (Signed)
Pt is scheduled for an appointment with Dr. Gwenlyn Found on 10/2

## 2016-11-07 ENCOUNTER — Encounter: Payer: Self-pay | Admitting: Cardiovascular Disease

## 2016-11-07 ENCOUNTER — Ambulatory Visit (INDEPENDENT_AMBULATORY_CARE_PROVIDER_SITE_OTHER): Payer: BLUE CROSS/BLUE SHIELD | Admitting: Cardiovascular Disease

## 2016-11-07 VITALS — BP 110/74 | HR 93 | Ht 63.0 in | Wt 161.0 lb

## 2016-11-07 DIAGNOSIS — F17200 Nicotine dependence, unspecified, uncomplicated: Secondary | ICD-10-CM | POA: Diagnosis not present

## 2016-11-07 DIAGNOSIS — I10 Essential (primary) hypertension: Secondary | ICD-10-CM

## 2016-11-07 DIAGNOSIS — I739 Peripheral vascular disease, unspecified: Secondary | ICD-10-CM | POA: Diagnosis not present

## 2016-11-07 DIAGNOSIS — E782 Mixed hyperlipidemia: Secondary | ICD-10-CM | POA: Diagnosis not present

## 2016-11-07 MED ORDER — ROSUVASTATIN CALCIUM 40 MG PO TABS: 40.0000 mg | ORAL_TABLET | Freq: Every day | ORAL | 1 refills | Status: DC

## 2016-11-07 NOTE — Assessment & Plan Note (Addendum)
History of essential hypertension blood pressure measured 139/87. She is on hydrochlorothiazide. Continue current meds at current dosing.

## 2016-11-07 NOTE — Assessment & Plan Note (Signed)
History of ongoing tobacco abuse of 3 cigarettes a day down from one pack a day.

## 2016-11-07 NOTE — Assessment & Plan Note (Signed)
History of CAD status post RCA intervention February 2010 with overlapping drug-eluting stents. Repeat cath the following year revealed a widely patent. She denies chest pain or shortness of breath.

## 2016-11-07 NOTE — Patient Instructions (Signed)
Medication Instructions: Increase Crestor (Rosuvastatin) to 40 mg daily.   Labwork: Your physician recommends that you return for a FASTING lipid profile and hepatic function panel in 2 months.   Testing/Procedures: Your physician has requested that you have a lower extremity arterial duplex. During this test, ultrasound is used to evaluate arterial blood flow in the legs. Allow one hour for this exam. There are no restrictions or special instructions.  Your physician has requested that you have an ankle brachial index (ABI). During this test an ultrasound and blood pressure cuff are used to evaluate the arteries that supply the arms and legs with blood. Allow thirty minutes for this exam. There are no restrictions or special instructions.  (In 6 months)   Follow-Up: Your physician wants you to follow-up in: 6 months with Dr. Gwenlyn Found. You will receive a reminder letter in the mail two months in advance. If you don't receive a letter, please call our office to schedule the follow-up appointment.  If you need a refill on your cardiac medications before your next appointment, please call your pharmacy.

## 2016-11-07 NOTE — Progress Notes (Signed)
11/07/2016 Rachel STAMMEN   November 13, 1950  [id number]  Primary Physician Rachel Kirschner, MD Primary Cardiologist: Rachel Harp MD Rachel [name], [place]  HPI:  Rachel [name] is a 66 y.o. female  mildly overweight divorced African-American female mother of one, grandmother of one grandchild who is referred by Dr. Domenic [name] for lifestyle limiting claudication. I last saw her in the office 04/14/16.Her primary care physician is Dr. Mickie [name]. She has a history of tobacco abuse smoking one pack per day for last 45 years, treated hypertension, diabetes and hyperlipidemia. She does have ischemic heart disease status post RCA intervention February 2010 with overlapping drug eluting stents in her RCA. She had repeat cath the following year revealing these to be widely patent. She complains of right lower extremity claudication claudication left greater than right over the last 2 years. 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 I reviewed this with Dr. Trula [name] who suggested that we attempt percutaneous vascular station before considering aortobifemoral bypass grafting. I angiogram on 10/15/14 and was able to cross her left common iliac chronic total occlusion. I stented both iliac arteries with I cast cover stents using "kissing stent technique". Her procedure was stopped. By a significant hematoma in the left side with a left common femoral pseudoaneurysm that was partially thrombosed initially and follow-up Doppler was completely thrombosed. Her hemoglobin stabilized not requiring transfusion. Her claudication has resolved although she still has some pain in her left hip from resolving hematoma. Her Doppler studies  showed post procedure normal ABIs bilaterally and her claudication resolved..  She had developed ischemic changes on her left heel. Her left ABI has dropped to 0.64. Her left femoral pulse is much weaker than her right. I suspect her left iliac stent is occluded. I performed peripheral angiography with attempt to intervene on 03/13/16 was unable to cross the iliac bifurcation because of the acute angle and stents. I brought her back on 03/27/16 intent to perform antegrade approach on her left common femoral artery and treat her mid left SFA however I was unable to access the femoral artery because of diffuse calcification and therefore the procedure was aborted. Since that time the redness and pain in her left foot has improved spontaneously. I am really much and does not describe lifestyle limiting claudication at this time. Should she require catheterization she would need left common femoral endarterectomy with patch angioplasty was staged left SFA diamondback orbital rotation arthrectomy using antegrade approach. She does have one vessel runoff in the left leg via an anterior tibial artery.  Since I saw her 6 months ago her ischemic changes on her left leg has spontaneously improved. She does have mild lifestyle limiting claudication. She denies chest pain or shortness of breath. Her most recent lower extremity Dopplers performed 10/18/16 revealing widely patent iliac stents with a right ABI of 1 and a left of 0.74.   Current Meds  Medication Sig  . aspirin EC 81 MG tablet Take 81 mg by mouth daily.    . blood glucose meter kit and supplies KIT Dispense based on patient and insurance preference. Use up to four times daily as directed. (FOR ICD-10 E11.65) Bayer Contour  . Blood Glucose Monitoring Suppl (BAYER CONTOUR  MONITOR) w/Device KIT Test BG 4 x daily. E11.65  . clopidogrel (PLAVIX) 75 MG tablet TAKE 1 TABLET (75 MG TOTAL) BY MOUTH DAILY.  Marland Kitchen Continuous Blood Gluc Sensor (FREESTYLE LIBRE SENSOR  SYSTEM) MISC Use one sensor every 10 days.  Marland Kitchen gabapentin (NEURONTIN) 100 MG capsule Take 300 mg by mouth at bedtime.  Marland Kitchen glucose blood (BAYER CONTOUR NEXT TEST) test strip Use as directed 4 x daily. E11.65  . hydrochlorothiazide (MICROZIDE) 12.5 MG capsule TAKE 1 CAPSULE (12.5 MG TOTAL) BY MOUTH DAILY.  Marland Kitchen HYDROcodone-acetaminophen (NORCO/VICODIN) 5-325 MG tablet Take 1 tablet by mouth every 6 (six) hours as needed. Use sparingly (Patient taking differently: Take 1 tablet by mouth every 6 (six) hours as needed for moderate pain. Use sparingly)  . insulin aspart (NOVOLOG) 100 UNIT/ML FlexPen Inject 12-18 Units into the skin 3 (three) times daily with meals.  . insulin glargine (LANTUS) 100 UNIT/ML injection Inject 0.4 mLs (40 Units total) into the skin at bedtime.  . Insulin Pen Needle (B-D ULTRAFINE III SHORT PEN) 31G X 8 MM MISC 1 each by Does not apply route as directed.  . Lancets 30G MISC Test 4 x daily. E11.65. Bayer contour next  . LANTUS SOLOSTAR 100 UNIT/ML Solostar Pen INJECT 50 UNITS INTO THE SKIN AT BEDTIME  . levothyroxine (SYNTHROID, LEVOTHROID) 100 MCG tablet TAKE 1 TABLET BY MOUTH DAILY BEFORE BREAKFAST  . meclizine (ANTIVERT) 25 MG tablet Take 25 mg by mouth as needed for dizziness.  Marland Kitchen oxybutynin (DITROPAN-XL) 10 MG 24 hr tablet Take 2 tablets (20 mg total) by mouth daily.  . pantoprazole (PROTONIX) 40 MG tablet TAKE 1 TABLET BY MOUTH EVERY DAY  . potassium chloride SA (KLOR-CON M20) 20 MEQ tablet Take 1 tablet (20 mEq total) by mouth 2 (two) times daily.  . rosuvastatin (CRESTOR) 40 MG tablet Take 1 tablet (40 mg total) by mouth at bedtime.  . [DISCONTINUED] rosuvastatin (CRESTOR) 20 MG tablet TAKE 1 TABLET (20 MG TOTAL) BY MOUTH AT BEDTIME.     Allergies  Allergen Reactions  . Altace [Ramipril] Cough  . Biaxin [Clarithromycin]     Fatigue  . Niacin And Related Other (See Comments)    Flush - felt like on fire.  . Nsaids Other (See Comments)    feverish  . Adhesive [Tape]  Rash    Paper tape is ok    Social History   Social History  . Marital status: Divorced    Spouse name: N/A  . Number of children: 1  . 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   Social History Main Topics  . Smoking status: Current Every Day Smoker    Packs/day: 0.50    Years: 47.00    Types: Cigarettes  . Smokeless tobacco: Never Used  . Alcohol use No  . Drug use: No  . Sexual activity: Yes    Partners: Male    Birth control/ protection: Condom     Comment: mutual friends   Other Topics Concern  . Not on file   Social History Narrative  . No narrative on file     Review of Systems: General: negative for chills, fever, night sweats or weight changes.  Cardiovascular: negative for chest pain, dyspnea on exertion, edema, orthopnea, palpitations, paroxysmal nocturnal dyspnea or shortness of breath Dermatological: negative for rash Respiratory: negative for cough or wheezing Urologic: negative for hematuria Abdominal: negative for nausea, vomiting, diarrhea, bright red blood per rectum, melena, or hematemesis  Neurologic: negative for visual changes, syncope, or dizziness All other systems reviewed and are otherwise negative except as noted above.    Blood pressure 110/74, pulse 93, height 5' 3"  (1.6 m), weight 161 lb (73 kg).  General appearance: alert and no distress Neck: no adenopathy, no carotid bruit, no JVD, supple, symmetrical, trachea midline and thyroid not enlarged, symmetric, no tenderness/mass/nodules Lungs: clear to auscultation bilaterally Heart: regular rate and rhythm, S1, S2 normal, no murmur, click, rub or gallop Extremities: extremities normal, atraumatic, no cyanosis or edema Pulses: Diminished pedal pulses bilaterally Skin: Skin color, texture, turgor normal. No rashes or lesions Neurologic: Alert and oriented X 3, normal strength and tone. Normal symmetric reflexes. Normal coordination and gait  EKG  sinus rhythm at 93 without ST or T-wave changes. I personally reviewed this EKG.  ASSESSMENT AND PLAN:   Mixed hyperlipidemia History of hyperlipidemia on rosuvastatin 20 mg a day with lipid profile performed 06/07/16 revealing LDL 101. I'm going to increase her rosuvastatin to 40 mg a day and will recheck a lipid and liver profile in 2 months.  TOBACCO ABUSE History of ongoing tobacco abuse of 3 cigarettes a day down from one pack a day.  Coronary atherosclerosis of native coronary artery History of CAD status post RCA intervention February 2010 with overlapping drug-eluting stents. Repeat cath the following year revealed a widely patent. She denies chest pain or shortness of breath.  Peripheral arterial disease ([place]) History of peripheral arterial disease status post stenting of a long chronic total occlusion of the left iliac artery. I have stated both iliac arteries using eye Covered stents using "kissing stent technique. She does have calcified left common femoral artery and one vessel runoff bilaterally via anterior tibial arteries. I did try antegrade approach to revascularize her mid left SFA with diamondback orbital atherectomy but was unable to access her common femoral because of diffuse calcification. Her left foot redness and pain spontaneously improved however. She does get some mild bilateral claudication with severe exercise but this is not lifestyle limiting.  Essential hypertension, benign History of essential hypertension blood pressure measured 139/87. She is on hydrochlorothiazide. Continue current meds at current dosing.      Rachel Harp MD FACP,FACC,FAHA, [facility] 11/07/2016 8:45 AM

## 2016-11-07 NOTE — Assessment & Plan Note (Signed)
History of peripheral arterial disease status post stenting of a long chronic total occlusion of the left iliac artery. I have stated both iliac arteries using eye Covered stents using "kissing stent technique. She does have calcified left common femoral artery and one vessel runoff bilaterally via anterior tibial arteries. I did try antegrade approach to revascularize her mid left SFA with diamondback orbital atherectomy but was unable to access her common femoral because of diffuse calcification. Her left foot redness and pain spontaneously improved however. She does get some mild bilateral claudication with severe exercise but this is not lifestyle limiting.

## 2016-11-07 NOTE — Assessment & Plan Note (Signed)
History of hyperlipidemia on rosuvastatin 20 mg a day with lipid profile performed 06/07/16 revealing LDL 101. I'm going to increase her rosuvastatin to 40 mg a day and will recheck a lipid and liver profile in 2 months.

## 2016-11-16 ENCOUNTER — Ambulatory Visit (INDEPENDENT_AMBULATORY_CARE_PROVIDER_SITE_OTHER): Payer: BLUE CROSS/BLUE SHIELD | Admitting: Family Medicine

## 2016-11-16 ENCOUNTER — Encounter: Payer: Self-pay | Admitting: Family Medicine

## 2016-11-16 VITALS — BP 110/80 | Ht 63.0 in | Wt 160.4 lb

## 2016-11-16 DIAGNOSIS — Z23 Encounter for immunization: Secondary | ICD-10-CM

## 2016-11-16 DIAGNOSIS — I10 Essential (primary) hypertension: Secondary | ICD-10-CM

## 2016-11-16 DIAGNOSIS — E785 Hyperlipidemia, unspecified: Secondary | ICD-10-CM

## 2016-11-16 DIAGNOSIS — I739 Peripheral vascular disease, unspecified: Secondary | ICD-10-CM | POA: Diagnosis not present

## 2016-11-16 NOTE — Progress Notes (Signed)
   Subjective:    Patient ID: Rachel [name], female    DOB: 02-24-1950, 66 y.o.   MRN: [id number]  Hypertension  This is a chronic problem. The current episode started more than 1 year ago.    diab overall good, numbrs improving   Dopplersrepeated, specialist wants to go another six months, ocnsidering prolonging the surgery, not experiencing pain   Still smoking off and  On, god and bad  Blood pressure medicine and blood pressure levels reviewed today with patient. Compliant with blood pressure medicine. States does not miss a dose. No obvious side effects. Blood pressure generally good when checked elsewhere. Watching salt intake.  Patient continues to take lipid medication regularly. No obvious side effects from it. Generally does not miss a dose. Prior blood work results are reviewed with patient. Patient continues to work on fat intake in diet  Usually numbers are good   Walking some, but not a lot , Feet pain from the neuropathy   Patient would like prevnar 13 and flu vaccines.             Review of Systems No headache, no major weight loss or weight gain, no chest pain no back pain abdominal pain no change in bowel habits complete ROS otherwise negative     Objective:   Physical Exam  Alert and oriented, vitals reviewed and stable, NAD ENT-TM's and ext canals WNL bilat via otoscopic exam Soft palate, tonsils and post pharynx WNL via oropharyngeal exam Neck-symmetric, no masses; thyroid nonpalpable and nontender Pulmonary-no tachypnea or accessory muscle use; Clear without wheezes via auscultation Card--no abnrml murmurs, rhythm reg and rate WNL Carotid pulses symmetric, without bruits       Assessment & Plan:  Impression 1 peripheral arterial disease. Discussed.when over results from specialist with patient why she has a serious problem she needs to at Strongly encouraged patient to stop smoking and sick with her medications.  Hyperlipidemia  discussed compliance discussed importancof sticking with meds discussd  #3 diabetes also discussed. Primarily followed by specialist at this time. #4 hypertension clinically stable blood pressure good maintain same  Chronic concerns discussed. Diet exercise discussed. Medications refilled. Follow-up as scheduled

## 2016-12-14 ENCOUNTER — Other Ambulatory Visit: Payer: Self-pay | Admitting: "Endocrinology

## 2017-01-03 ENCOUNTER — Other Ambulatory Visit: Payer: Self-pay | Admitting: "Endocrinology

## 2017-01-03 ENCOUNTER — Other Ambulatory Visit: Payer: Self-pay | Admitting: Family Medicine

## 2017-01-05 ENCOUNTER — Other Ambulatory Visit: Payer: Self-pay | Admitting: Family Medicine

## 2017-01-05 ENCOUNTER — Other Ambulatory Visit: Payer: Self-pay | Admitting: Cardiovascular Disease

## 2017-01-09 ENCOUNTER — Ambulatory Visit: Payer: BLUE CROSS/BLUE SHIELD | Admitting: "Endocrinology

## 2017-01-22 ENCOUNTER — Other Ambulatory Visit: Payer: Self-pay | Admitting: Family Medicine

## 2017-01-23 ENCOUNTER — Ambulatory Visit: Payer: BLUE CROSS/BLUE SHIELD | Admitting: "Endocrinology

## 2017-02-01 ENCOUNTER — Other Ambulatory Visit: Payer: Self-pay | Admitting: Family Medicine

## 2017-02-04 ENCOUNTER — Other Ambulatory Visit: Payer: Self-pay | Admitting: "Endocrinology

## 2017-02-09 LAB — RENAL FUNCTION PANEL
Albumin: 4.2 g/dL (ref 3.6–5.1)
BUN / CREAT RATIO: 15 (calc) (ref 6–22)
BUN: 20 mg/dL (ref 7–25)
CO2: 24 mmol/L (ref 20–32)
CREATININE: 1.31 mg/dL — AB (ref 0.50–0.99)
Calcium: 9.6 mg/dL (ref 8.6–10.4)
Chloride: 98 mmol/L (ref 98–110)
Glucose, Bld: 180 mg/dL — ABNORMAL HIGH (ref 65–139)
PHOSPHORUS: 4.2 mg/dL (ref 2.5–4.5)
Potassium: 4.2 mmol/L (ref 3.5–5.3)
SODIUM: 132 mmol/L — AB (ref 135–146)

## 2017-02-09 LAB — TSH: TSH: 4.2 mIU/L (ref 0.40–4.50)

## 2017-02-09 LAB — HEMOGLOBIN A1C
EAG (MMOL/L): 11.4 (calc)
HEMOGLOBIN A1C: 8.8 %{Hb} — AB (ref <5.7)
Mean Plasma Glucose: 206 (calc)

## 2017-02-09 LAB — T4, FREE: FREE T4: 1.2 ng/dL (ref 0.8–1.8)

## 2017-02-09 LAB — MICROALBUMIN / CREATININE URINE RATIO
CREATININE, URINE: 21 mg/dL (ref 20–275)
MICROALB UR: 1.4 mg/dL
Microalb Creat Ratio: 67 mcg/mg creat — ABNORMAL HIGH (ref <30)

## 2017-02-09 LAB — VITAMIN D 25 HYDROXY (VIT D DEFICIENCY, FRACTURES): VIT D 25 HYDROXY: 14 ng/mL — AB (ref 30–100)

## 2017-02-15 ENCOUNTER — Encounter: Payer: Self-pay | Admitting: "Endocrinology

## 2017-02-15 ENCOUNTER — Ambulatory Visit: Payer: BLUE CROSS/BLUE SHIELD | Admitting: "Endocrinology

## 2017-02-15 VITALS — BP 122/80 | HR 94 | Ht 63.0 in | Wt 161.0 lb

## 2017-02-15 DIAGNOSIS — E559 Vitamin D deficiency, unspecified: Secondary | ICD-10-CM | POA: Diagnosis not present

## 2017-02-15 DIAGNOSIS — E039 Hypothyroidism, unspecified: Secondary | ICD-10-CM

## 2017-02-15 DIAGNOSIS — E1159 Type 2 diabetes mellitus with other circulatory complications: Secondary | ICD-10-CM

## 2017-02-15 DIAGNOSIS — E782 Mixed hyperlipidemia: Secondary | ICD-10-CM

## 2017-02-15 DIAGNOSIS — I10 Essential (primary) hypertension: Secondary | ICD-10-CM | POA: Diagnosis not present

## 2017-02-15 MED ORDER — INSULIN GLARGINE 300 UNIT/ML ~~LOC~~ SOPN: 50.0000 [IU] | PEN_INJECTOR | Freq: Every day | SUBCUTANEOUS | 2 refills | Status: DC

## 2017-02-15 NOTE — Patient Instructions (Signed)

## 2017-02-15 NOTE — Progress Notes (Signed)
Subjective:    Patient ID: Rachel [name], female    DOB: [birth year], PCP Mikey Kirschner, MD   Past Medical History:  Diagnosis Date  . Asthma   . Coronary atherosclerosis of native coronary artery    DES x 2 (overlapping) RCA 03/2008 - patent 2011  . Diabetes type 2, controlled ([place]) 2000  . Essential hypertension, benign   . Fatty liver   . GERD (gastroesophageal reflux disease)   . Hyperlipidemia   . Microalbuminuria   . Obesity   . Odynophagia    Severe esophagitis on CT 05/2010, treated empirically with Diflucan  . PAD (peripheral artery disease) (HCC)    history of left greater than right lower 70 claudication  . Tubular adenoma of colon 07/2007   Multiple colonic polyps  . Venous stasis    Past Surgical History:  Procedure Laterality Date  . CARDIAC CATHETERIZATION     stents  . CHOLECYSTECTOMY N/A 07/30/2015   Procedure: LAPAROSCOPIC CHOLECYSTECTOMY;  Surgeon: Aviva Signs, MD;  Location: [facility];  Service: General;  Laterality: N/A;  . COLONOSCOPY W/ POLYPECTOMY  07/2007   Tubular adenoma  . CORONARY STENT PLACEMENT    . CYSTECTOMY     Pilonidal  . ESOPHAGOGASTRODUODENOSCOPY  04/2010   RMR: Normal esophagus , Stomach D1 and D2   . LOWER EXTREMITY ANGIOGRAPHY N/A 03/13/2016   Procedure: Lower Extremity Angiography;  Surgeon: [name], MD;  Location: [facility];  Service: Cardiovascular;  Laterality: N/A;  . PERIPHERAL VASCULAR ATHERECTOMY  03/27/2016   Procedure: Peripheral Vascular Atherectomy;  Surgeon: [name], MD;  Location: [facility];  Service: Cardiovascular;;  Attempted atherectomy  . PERIPHERAL VASCULAR CATHETERIZATION Bilateral 09/28/2014   Procedure: Lower Extremity Angiography;  Surgeon: [name], MD;  Location: [facility];  Service: Cardiovascular;  Laterality: Bilateral;  . PERIPHERAL VASCULAR CATHETERIZATION N/A 09/28/2014   Procedure: Abdominal Aortogram;  Surgeon: [name], MD;  Location: [facility];  Service: Cardiovascular;  Laterality: N/A;  . PERIPHERAL VASCULAR CATHETERIZATION N/A 10/15/2014   Procedure: Lower Extremity Angiography;  Surgeon: [name], MD;  Location: [facility];  Service: Cardiovascular;  Laterality: N/A;  . PERIPHERAL VASCULAR CATHETERIZATION  10/15/2014   Procedure: Peripheral Vascular Intervention;  Surgeon: [name], MD;  Location: [facility];  Service: Cardiovascular;;  bilateral iliac stents  . PILONIDAL CYST / SINUS EXCISION    . TUBAL LIGATION Bilateral    Social History   Socioeconomic History  . Marital status: Divorced    Spouse name: None  . Number of children: 1  . Years of education: None  . Highest education level: None  Social Needs  . Financial resource strain: None  . Food insecurity - worry: None  . Food insecurity - inability: None  . Transportation needs - medical: None  . Transportation needs - non-medical: None  Occupational History  . Occupation: Tax inspector: [employer]  Tobacco Use  . Smoking status: Current Every Day Smoker    Packs/day: 0.50    Years: 47.00    Pack years: 23.50    Types: Cigarettes  . Smokeless tobacco: Never Used  Substance and Sexual Activity  . Alcohol use: No  . Drug use: No  . Sexual activity: Yes    Partners: Male    Birth control/protection: Condom    Comment: mutual friends  Other Topics Concern  . None  Social History Narrative  . None   Outpatient Encounter Medications as of 02/15/2017  Medication Sig  . aspirin EC 81 MG tablet Take 81 mg by mouth daily.    . blood glucose meter kit and supplies KIT Dispense based on patient and insurance preference. Use up to four times daily as directed. (FOR ICD-10 E11.65) Bayer Contour  . Blood Glucose Monitoring Suppl (BAYER CONTOUR MONITOR) w/Device KIT Test BG 4 x daily. E11.65  . clopidogrel (PLAVIX) 75 MG tablet Take 1 tablet (75 mg total) by mouth daily.  . Continuous Blood Gluc Sensor  (FREESTYLE LIBRE SENSOR SYSTEM) MISC USE ONE SENSOR EVERY 10 DAYS.  Marland Kitchen gabapentin (NEURONTIN) 400 MG capsule TAKE ONE CAPSULE BY MOUTH AT BEDTIME AS DIRECTED  . glucose blood (BAYER CONTOUR NEXT TEST) test strip Use as directed 4 x daily. E11.65  . hydrochlorothiazide (MICROZIDE) 12.5 MG capsule TAKE 1 CAPSULE (12.5 MG TOTAL) BY MOUTH DAILY.  Marland Kitchen HYDROcodone-acetaminophen (NORCO/VICODIN) 5-325 MG tablet Take 1 tablet by mouth every 6 (six) hours as needed. Use sparingly (Patient taking differently: Take 1 tablet by mouth every 6 (six) hours as needed for moderate pain. Use sparingly)  . insulin aspart (NOVOLOG FLEXPEN) 100 UNIT/ML FlexPen Inject 14-20 Units 3 (three) times daily with meals into the skin.  . Insulin Glargine (TOUJEO MAX SOLOSTAR) 300 UNIT/ML SOPN Inject 50 Units into the skin at bedtime.  . Insulin Pen Needle (B-D ULTRAFINE III SHORT PEN) 31G X 8 MM MISC 1 each by Does not apply route as directed.  . Lancets 30G MISC Test 4 x daily. E11.65. Bayer contour next  . levothyroxine (SYNTHROID, LEVOTHROID) 100 MCG tablet TAKE 1 TABLET BY MOUTH DAILY BEFORE BREAKFAST  . meclizine (ANTIVERT) 25 MG tablet Take 25 mg by mouth as needed for dizziness.  Marland Kitchen oxybutynin (DITROPAN-XL) 10 MG 24 hr tablet Take 2 tablets (20 mg total) by mouth daily.  Marland Kitchen oxybutynin (DITROPAN-XL) 10 MG 24 hr tablet TAKE 2 TABLETS BY MOUTH EVERY DAY  . pantoprazole (PROTONIX) 40 MG tablet TAKE 1 TABLET BY MOUTH EVERY DAY  . potassium chloride SA (KLOR-CON M20) 20 MEQ tablet Take 1 tablet (20 mEq total) by mouth 2 (two) times daily.  . rosuvastatin (CRESTOR) 40 MG tablet Take 1 tablet (40 mg total) by mouth at bedtime.  . [DISCONTINUED] LANTUS SOLOSTAR 100 UNIT/ML Solostar Pen INJECT 50 UNITS INTO THE SKIN AT BEDTIME  . [DISCONTINUED] levothyroxine (SYNTHROID, LEVOTHROID) 50 MCG tablet TAKE 2 TABLETS (100 MCG) BY MOUTH EVERY DAY  . [DISCONTINUED] rosuvastatin (CRESTOR) 20 MG tablet TAKE 1 TABLET BY MOUTH EVERYDAY AT BEDTIME    No facility-administered encounter medications on file as of 02/15/2017.    ALLERGIES: Allergies  Allergen Reactions  . Altace [Ramipril] Cough  . Biaxin [Clarithromycin]     Fatigue  . Niacin And Related Other (See Comments)    Flush - felt like on fire.  . Nsaids Other (See Comments)    feverish  . Adhesive [Tape] Rash    Paper tape is ok   VACCINATION STATUS: Immunization History  Administered Date(s) Administered  . Influenza,inj,Quad PF,6+ Mos 10/18/2015, 11/16/2016  . Influenza-Unspecified 11/07/2007, 12/07/2013  . Pneumococcal Polysaccharide-23 12/06/2006    Diabetes  She presents for her follow-up diabetic visit. She has type 2 diabetes mellitus. Onset time: She was diagnosed at approximate age of 9 years. Her disease course has been improving. There are no hypoglycemic associated symptoms. Pertinent negatives for hypoglycemia include no confusion, headaches, pallor or seizures. Associated symptoms  include blurred vision, fatigue, foot paresthesias and foot ulcerations. Pertinent negatives for diabetes include no chest pain, no polydipsia, no polyphagia and no polyuria. There are no hypoglycemic complications. Symptoms are improving. Diabetic complications include autonomic neuropathy, nephropathy, PVD and retinopathy. Risk factors for coronary artery disease include diabetes mellitus, dyslipidemia, hypertension, sedentary lifestyle and tobacco exposure. Current diabetic treatments: She says she is on Lantus 82 units nightly, NovoLog to 10 units 3 times a day before meals, metformin 500 mg by mouth twice a day. She is compliant with treatment most of the time. Her weight is stable. She is following a generally unhealthy diet. When asked about meal planning, she reported none. She has not had a previous visit with a dietitian. She never participates in exercise. Blood glucose monitoring compliance is adequate. Her breakfast blood glucose range is generally >200 mg/dl. Her lunch  blood glucose range is generally >200 mg/dl. Her dinner blood glucose range is generally >200 mg/dl. Her bedtime blood glucose range is generally >200 mg/dl. Her overall blood glucose range is >200 mg/dl. (She is currently using continuous glucose monitoring, the [place] device, showing 36% time in range and 60% above target. Her average blood glucose is 205 for the last month. - Her recent A1c is 8.8% improving from 9.1%.) An ACE inhibitor/angiotensin II receptor blocker is contraindicated. She sees a podiatrist.Eye exam is current.  Hyperlipidemia  This is a chronic problem. The current episode started more than 1 year ago. The problem is uncontrolled. Recent lipid tests were reviewed and are variable. Exacerbating diseases include diabetes, hypothyroidism and obesity. Pertinent negatives include no chest pain, myalgias or shortness of breath. Current antihyperlipidemic treatment includes statins. Risk factors for coronary artery disease include dyslipidemia, family history, hypertension, diabetes mellitus and a sedentary lifestyle.  Hypertension  This is a chronic problem. The current episode started more than 1 year ago. The problem is controlled. Associated symptoms include blurred vision. Pertinent negatives include no chest pain, headaches, palpitations or shortness of breath. Risk factors for coronary artery disease include dyslipidemia, diabetes mellitus, smoking/tobacco exposure and sedentary lifestyle. Past treatments include diuretics. Hypertensive end-organ damage includes kidney disease, PVD and retinopathy.     Review of Systems  Constitutional: Positive for fatigue and unexpected weight change. Negative for chills and fever.  HENT: Negative for trouble swallowing and voice change.   Eyes: Positive for blurred vision. Negative for visual disturbance.  Respiratory: Negative for cough, shortness of breath and wheezing.   Cardiovascular: Negative for chest pain, palpitations and leg swelling.   Gastrointestinal: Negative for diarrhea, nausea and vomiting.  Endocrine: Negative for cold intolerance, heat intolerance, polydipsia, polyphagia and polyuria.  Musculoskeletal: Positive for gait problem. Negative for arthralgias and myalgias.  Skin: Negative for color change, pallor, rash and wound.  Neurological: Negative for seizures and headaches.  Psychiatric/Behavioral: Negative for confusion and suicidal ideas.    Objective:    BP 122/80   Pulse 94   Ht 5' 3"  (1.6 m)   Wt 161 lb (73 kg)   BMI 28.52 kg/m   Wt Readings from Last 3 Encounters:  02/15/17 161 lb (73 kg)  11/16/16 160 lb 6 oz (72.7 kg)  11/07/16 161 lb (73 kg)    Physical Exam  Constitutional: She is oriented to person, place, and time. She appears well-developed.  HENT:  Head: Normocephalic and atraumatic.  Eyes: EOM are normal.  Neck: Normal range of motion. Neck supple. No tracheal deviation present. No thyromegaly present.  Cardiovascular: Normal rate and regular rhythm.  Pulses:      Dorsalis pedis pulses are 0 on the right side, and 0 on the left side.       Posterior tibial pulses are 0 on the right side, and 0 on the left side.  Pulmonary/Chest: Effort normal and breath sounds normal.  Abdominal: Soft. Bowel sounds are normal. There is no tenderness. There is no guarding.  Musculoskeletal: Normal range of motion. She exhibits no edema.  Neurological: She is alert and oriented to person, place, and time. She has normal reflexes. No cranial nerve deficit. Coordination normal.  Skin: Skin is warm and dry. No rash noted. No erythema. No pallor.  Psychiatric: She has a normal mood and affect. Judgment normal.    CMP     Component Value Date/Time   NA 132 (L) 02/08/2017 1422   NA 135 09/29/2016 0824   K 4.2 02/08/2017 1422   CL 98 02/08/2017 1422   CO2 24 02/08/2017 1422   GLUCOSE 180 (H) 02/08/2017 1422   BUN 20 02/08/2017 1422   BUN 15 09/29/2016 0824   CREATININE 1.31 (H) 02/08/2017 1422    CALCIUM 9.6 02/08/2017 1422   PROT 7.3 09/29/2016 0824   ALBUMIN 3.9 09/29/2016 0824   AST 17 09/29/2016 0824   ALT 7 09/29/2016 0824   ALKPHOS 157 (H) 09/29/2016 0824   BILITOT <0.2 09/29/2016 0824   GFRNONAA 52 (L) 09/29/2016 0824   GFRNONAA 57 (L) 03/21/2016 1229   GFRAA 60 09/29/2016 0824   GFRAA 65 03/21/2016 1229     Diabetic Labs (most recent): Lab Results  Component Value Date   HGBA1C 8.8 (H) 02/08/2017   HGBA1C 9.1 (H) 06/07/2016   HGBA1C 10.0 03/03/2016     Lipid Panel ( most recent) Lipid Panel     Component Value Date/Time   CHOL 167 06/07/2016 0852   TRIG 182 (H) 06/07/2016 0852   HDL 30 (L) 06/07/2016 0852   CHOLHDL 5.8 (H) 04/17/2015 0908   CHOLHDL 3.6 04/18/2014 0833   VLDL 23 04/18/2014 0833   LDLCALC 101 (H) 06/07/2016 0852      Assessment & Plan:   1. DM type 2 causing vascular disease ([place]), Peripheral arterial disease which required bypass graft, retinopathy, nephropathy  -  She has hx of  alarming noncompliance/nonadherence,  and patient remains at a high risk for more acute and chronic complications of diabetes which include CAD, CVA, CKD, retinopathy, and neuropathy. These are all discussed in detail with the patient.  Patient came with improving glucose profile while using continuous glucose monitoring since her last visit. - Her recent labs show A1c of 8.8%, slowly improving from 9.1% in May 2018.   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  - I have re-counseled the patient on diet management and   by adopting a carbohydrate restricted / protein rich  Diet.  -  Suggestion is made for her to avoid simple carbohydrates  from her diet including Cakes, Sweet Desserts / Pastries, Ice Cream, Soda (diet and regular), Sweet Tea, Candies, Chips, Cookies, Store Bought Juices, Alcohol in Excess of  1-2 drinks a day, Artificial Sweeteners, and "Sugar-free" Products. This will help patient to have stable blood glucose profile and  potentially avoid unintended weight gain.  - Patient is advised to stick to a routine mealtimes to eat 3 meals  a day and avoid unnecessary snacks ( to snack only to correct hypoglycemia).   - I have approached patient with the following individualized plan to manage diabetes  and patient agrees.  - She came with better commitment while using her continuous glucose monitor, average blood glucose still above target at 205, 60% above target and 36% time in range.    - I will switch her basal insulin from Lantus to Toujeo at 50 units  daily at bedtime, continue  NovoLog  14 units 3 times a day before meals for pre-meal BG readings of 90-159m/dl, plus patient specific correction dose of rapid acting insulin  for unexpected hyperglycemia above 1534mdl, associated with strict monitoring of glucose  4 times a day.  - Patient is warned not to take insulin without proper monitoring per orders. -Adjustment parameters are given for hypo and hyperglycemia in writing. -Patient is encouraged to call clinic for blood glucose levels less than 70 or above 300 mg /dl.  - At this time, patient is not a candidate for metformin, SGLT2 inhibitors due to CKD and severe peripheral arterial disease which required bypass surgery. - Insulin is the safest choice of therapy for her diabetes at this time.  - Patient specific target  for A1c; LDL, HDL, Triglycerides, and  Waist Circumference were discussed in detail.  2) BP/HTN: her blood pressure is controlled to target.  Continue current medications including hydrochlorothiazide, she is allergic to ACE inhibitors.  3) Lipids/HPL: Uncontrolled with LDL 110.  Patient is advised to continue statins. 4)  Weight/Diet:  her weight is stabilizing around 160 pounds over the last 3 visits. CDE consult will be initiated, and carbohydrates information provided.  5) hypothyroidism: Her thyroid function tests are consistent with appropriate replacement at this time. I advised her  to continue her levothyroxine 100 g by mouth every morning.   - We discussed about correct intake of levothyroxine, at fasting, with water, separated by at least 30 minutes from breakfast, and separated by more than 4 hours from calcium, iron, multivitamins, acid reflux medications (PPIs). -Patient is made aware of the fact that thyroid hormone replacement is needed for life, dose to be adjusted by periodic monitoring of thyroid function tests.  6) vitamin D deficiency: - I discussed and initiated vitamin D 3 5000 units daily for the next 90 days.  7) Chronic Care/Health Maintenance:  -Patient is Statin medications and encouraged to continue to follow up with Ophthalmology, Podiatrist at least yearly or according to recommendations, and advised to quit smoking. I have recommended yearly flu vaccine and pneumonia vaccination at least every 5 years; moderate intensity exercise for up to 150 minutes weekly; and  sleep for at least 7 hours a day.  - Time spent with the patient: 25 min, of which >50% was spent in reviewing her sugar logs , discussing her hypo- and hyper-glycemic episodes, reviewing her current and  previous labs and insulin doses and developing a plan to avoid hypo- and hyper-glycemia.   - I advised patient to maintain close follow up with LuMikey KirschnerMD for primary care needs.  - Time spent with the patient: 25 min, of which >50% was spent in reviewing her blood glucose logs , discussing her hypo- and hyper-glycemic episodes, reviewing her current and  previous labs and insulin doses and developing a plan to avoid hypo- and hyper-glycemia. Please refer to Patient Instructions for Blood Glucose Monitoring and Insulin/Medications Dosing Guide"  in media tab for additional information.  Follow up plan: -Return in about 3 months (around 05/16/2017) for follow up with pre-visit labs, meter, and logs.  GeGlade LloydMD Phone: [phone]: [phone] note  was partially  dictated with voice recognition software. Similar sounding words can be transcribed inadequately or may not  be corrected upon review.  02/15/2017, 12:58 PM

## 2017-03-01 ENCOUNTER — Other Ambulatory Visit: Payer: Self-pay | Admitting: "Endocrinology

## 2017-03-05 ENCOUNTER — Other Ambulatory Visit: Payer: Self-pay

## 2017-03-05 MED ORDER — VITAMIN D (ERGOCALCIFEROL) 1.25 MG (50000 UNIT) PO CAPS: 50000.0000 [IU] | ORAL_CAPSULE | ORAL | 0 refills | Status: DC

## 2017-03-05 MED ORDER — INSULIN GLARGINE 300 UNIT/ML ~~LOC~~ SOPN: 50.0000 [IU] | PEN_INJECTOR | Freq: Every day | SUBCUTANEOUS | 2 refills | Status: DC

## 2017-03-07 ENCOUNTER — Other Ambulatory Visit: Payer: Self-pay

## 2017-03-28 ENCOUNTER — Other Ambulatory Visit: Payer: Self-pay | Admitting: *Deleted

## 2017-03-28 ENCOUNTER — Other Ambulatory Visit: Payer: Self-pay

## 2017-03-28 MED ORDER — PANTOPRAZOLE SODIUM 40 MG PO TBEC: 40.0000 mg | DELAYED_RELEASE_TABLET | Freq: Every day | ORAL | 0 refills | Status: DC

## 2017-03-28 MED ORDER — LEVOTHYROXINE SODIUM 100 MCG PO TABS: ORAL_TABLET | ORAL | 0 refills | Status: DC

## 2017-03-29 ENCOUNTER — Ambulatory Visit: Payer: BLUE CROSS/BLUE SHIELD | Admitting: Family Medicine

## 2017-03-29 ENCOUNTER — Encounter: Payer: Self-pay | Admitting: Family Medicine

## 2017-03-29 VITALS — BP 122/88 | Ht 63.0 in | Wt 160.0 lb

## 2017-03-29 DIAGNOSIS — R0609 Other forms of dyspnea: Secondary | ICD-10-CM

## 2017-03-29 DIAGNOSIS — J329 Chronic sinusitis, unspecified: Secondary | ICD-10-CM | POA: Diagnosis not present

## 2017-03-29 DIAGNOSIS — R0600 Dyspnea, unspecified: Secondary | ICD-10-CM

## 2017-03-29 DIAGNOSIS — F5101 Primary insomnia: Secondary | ICD-10-CM | POA: Diagnosis not present

## 2017-03-29 MED ORDER — CLONAZEPAM 0.5 MG PO TABS: ORAL_TABLET | ORAL | 5 refills | Status: DC

## 2017-03-29 MED ORDER — AMOXICILLIN-POT CLAVULANATE 875-125 MG PO TABS: 1.0000 | ORAL_TABLET | Freq: Two times a day (BID) | ORAL | 0 refills | Status: AC

## 2017-03-29 NOTE — Progress Notes (Signed)
   Subjective:    Patient ID: Rachel [name], female    DOB: 1950-03-13, 67 y.o.   MRN: [id number]  HPI Patient is here today to follow up on HTN and Hypothyroidism.  Copd concerns. She is currently taking HCTZ 12.5 one daily.Levothyroxine 100 mcg daily. She eats healthy and does not get much exercise.  She sees [facility] specialist Dr. Gwenlyn Found vascular,Dr. Nida Endocrinologist and Dr.Joyce Podiatrist. Patient has had a fall in the last few week not injured. She has fell twice with in the year.Marked in the screening tab.  Having persistent sinus infxn that wont go away, bad choking cough, cong in the throat , couple months ,  Sud afed  xysal did not hel[p  Pos trouble sleeping at night,worsenng over the past yr, very sleepy during day,  Has not tried an y Building control surveyor meds ,, no fm hx of t[place] sleeig     sleeping intermittent  Personal stress, works from home,  Progressive sob with any tyope of exertion, keeping g kids so no stime for exercise, smoke  One ppd for fifty yres       Review of Systems No headache, no major weight loss or weight gain, no chest pain no back pain abdominal pain no change in bowel habits complete ROS otherwise negative     Objective:   Physical Exam  Alert vitals stable, NAD. Blood pressure good on repeat. HEENT normal. Lungs clear. Heart regular rate and rhythm. Alert and oriented, vitals reviewed and stable, NAD ENT-TM's and ext canals WNL bilat via otoscopic exam frontal nasal congestion plus minus frontal tenderness. Soft palate, tonsils and post pharynx WNL via oropharyngeal exam Neck-symmetric, no masses; thyroid nonpalpable and nontender Pulmonary-no tachypnea or accessory muscle use; Clear without wheezes via auscultation Card--no abnrml murmurs, rhythm reg and rate WNL Carotid pulses symmetric, without bruits       Assessment & Plan:  Impression #1 COPD concerns.  50-year pack history of smoking.  Unfortunately still smoking despite serious  peripheral arterial disease.  Notes progressive dyspnea with exertion.  No chest pain no nausea no paresis not exercising at all  2.  Insomnia.  Exacerbated by stress as use medication past with success over-the-counter agents not working patient would like to get back on some  3.  Subacute rhinosinusitis discussed we will press on cover with antibiotics  Pulmonary function test scheduled.  Visit following this to discuss results and potential interventions  Greater than 50% of this 25 minute face to face visit was spent in counseling and discussion and coordination of care regarding the above diagnosis/diagnosies

## 2017-04-02 ENCOUNTER — Ambulatory Visit (HOSPITAL_COMMUNITY): Payer: BLUE CROSS/BLUE SHIELD

## 2017-04-04 ENCOUNTER — Other Ambulatory Visit: Payer: Self-pay

## 2017-04-04 MED ORDER — INSULIN ASPART 100 UNIT/ML FLEXPEN: PEN_INJECTOR | SUBCUTANEOUS | 0 refills | Status: DC

## 2017-04-05 ENCOUNTER — Ambulatory Visit (HOSPITAL_COMMUNITY)
Admission: RE | Admit: 2017-04-05 | Discharge: 2017-04-05 | Disposition: A | Discharge status: Home / Self Care | Payer: BLUE CROSS/BLUE SHIELD | Source: Ambulatory Visit | Attending: Family Medicine | Admitting: Family Medicine

## 2017-04-05 ENCOUNTER — Other Ambulatory Visit: Payer: Self-pay

## 2017-04-05 DIAGNOSIS — R0609 Other forms of dyspnea: Secondary | ICD-10-CM | POA: Insufficient documentation

## 2017-04-05 LAB — PULMONARY FUNCTION TEST
DL/VA % pred: 36 %
DL/VA: 1.71 ml/min/mmHg/L
DLCO unc % pred: 31 %
DLCO unc: 7.11 ml/min/mmHg
FEF 25-75 Post: 0.91 L/sec
FEF 25-75 Pre: 1.44 L/sec
FEF2575-%Change-Post: -36 %
FEF2575-%Pred-Post: 49 %
FEF2575-%Pred-Pre: 78 %
FEV1-%Change-Post: -3 %
FEV1-%PRED-POST: 75 %
FEV1-%PRED-PRE: 78 %
FEV1-PRE: 1.48 L
FEV1-Post: 1.43 L
FEV1FVC-%Change-Post: 6 %
FEV1FVC-%Pred-Pre: 92 %
FEV6-%Change-Post: 1 %
FEV6-%PRED-PRE: 77 %
FEV6-%Pred-Post: 78 %
FEV6-POST: 1.84 L
FEV6-Pre: 1.81 L
FEV6FVC-%Change-Post: 0 %
FEV6FVC-%PRED-POST: 103 %
FEV6FVC-%Pred-Pre: 104 %
FVC-%Change-Post: -9 %
FVC-%PRED-PRE: 83 %
FVC-%Pred-Post: 76 %
FVC-POST: 1.84 L
FVC-PRE: 2.03 L
PRE FEV1/FVC RATIO: 73 %
Post FEV1/FVC ratio: 78 %
Post FEV6/FVC ratio: 100 %
Pre FEV6/FVC Ratio: 100 %
RV % PRED: 112 %
RV: 2.28 L
TLC % PRED: 95 %
TLC: 4.66 L

## 2017-04-05 MED ORDER — ALBUTEROL SULFATE (2.5 MG/3ML) 0.083% IN NEBU
2.5000 mg | INHALATION_SOLUTION | Freq: Once | RESPIRATORY_TRACT | Status: AC
  Administered 2017-04-05: 2.5 mg via RESPIRATORY_TRACT

## 2017-04-05 MED ORDER — INSULIN ASPART 100 UNIT/ML FLEXPEN: PEN_INJECTOR | SUBCUTANEOUS | 0 refills | Status: DC

## 2017-04-06 ENCOUNTER — Encounter: Payer: Self-pay | Admitting: Family Medicine

## 2017-04-06 ENCOUNTER — Other Ambulatory Visit: Payer: Self-pay | Admitting: Family Medicine

## 2017-04-06 DIAGNOSIS — I2510 Atherosclerotic heart disease of native coronary artery without angina pectoris: Secondary | ICD-10-CM

## 2017-04-06 NOTE — Progress Notes (Unsigned)
amb  

## 2017-04-12 ENCOUNTER — Ambulatory Visit (HOSPITAL_COMMUNITY)
Admission: RE | Admit: 2017-04-12 | Discharge: 2017-04-12 | Disposition: A | Discharge status: Home / Self Care | Payer: BLUE CROSS/BLUE SHIELD | Source: Ambulatory Visit | Attending: Cardiovascular Disease | Admitting: Cardiovascular Disease

## 2017-04-12 ENCOUNTER — Telehealth: Payer: Self-pay

## 2017-04-12 DIAGNOSIS — I2510 Atherosclerotic heart disease of native coronary artery without angina pectoris: Secondary | ICD-10-CM | POA: Insufficient documentation

## 2017-04-12 DIAGNOSIS — I10 Essential (primary) hypertension: Secondary | ICD-10-CM | POA: Diagnosis not present

## 2017-04-12 DIAGNOSIS — Z794 Long term (current) use of insulin: Secondary | ICD-10-CM | POA: Diagnosis not present

## 2017-04-12 DIAGNOSIS — E119 Type 2 diabetes mellitus without complications: Secondary | ICD-10-CM | POA: Insufficient documentation

## 2017-04-12 DIAGNOSIS — I6529 Occlusion and stenosis of unspecified carotid artery: Secondary | ICD-10-CM | POA: Diagnosis not present

## 2017-04-12 DIAGNOSIS — E785 Hyperlipidemia, unspecified: Secondary | ICD-10-CM | POA: Insufficient documentation

## 2017-04-12 DIAGNOSIS — F17200 Nicotine dependence, unspecified, uncomplicated: Secondary | ICD-10-CM | POA: Insufficient documentation

## 2017-04-12 NOTE — Telephone Encounter (Signed)
Pt wanted [place] to ask Dr Dorris Fetch if she can change from Novolog due to the cost. She is requesting a new prescription that is possibly cheaper

## 2017-04-12 NOTE — Telephone Encounter (Signed)
She may get samples of NovoLog, Humalog, or Apidra to use until she returns for her visit and will discuss her options during her next visit.

## 2017-04-12 NOTE — Telephone Encounter (Signed)
Pt notified by voicemail.

## 2017-04-16 ENCOUNTER — Other Ambulatory Visit: Payer: Self-pay | Admitting: Cardiovascular Disease

## 2017-04-16 DIAGNOSIS — I6529 Occlusion and stenosis of unspecified carotid artery: Secondary | ICD-10-CM

## 2017-04-21 ENCOUNTER — Other Ambulatory Visit: Payer: Self-pay | Admitting: "Endocrinology

## 2017-04-25 ENCOUNTER — Other Ambulatory Visit: Payer: Self-pay

## 2017-04-25 MED ORDER — INSULIN ASPART 100 UNIT/ML FLEXPEN: PEN_INJECTOR | SUBCUTANEOUS | 0 refills | Status: DC

## 2017-05-10 ENCOUNTER — Ambulatory Visit: Payer: BLUE CROSS/BLUE SHIELD | Admitting: Emergency Medicine

## 2017-05-10 ENCOUNTER — Encounter: Payer: Self-pay | Admitting: Emergency Medicine

## 2017-05-10 VITALS — BP 130/82 | HR 90 | Ht 63.0 in | Wt 156.0 lb

## 2017-05-10 DIAGNOSIS — Z122 Encounter for screening for malignant neoplasm of respiratory organs: Secondary | ICD-10-CM

## 2017-05-10 DIAGNOSIS — J449 Chronic obstructive pulmonary disease, unspecified: Secondary | ICD-10-CM

## 2017-05-10 DIAGNOSIS — Z87891 Personal history of nicotine dependence: Secondary | ICD-10-CM | POA: Diagnosis not present

## 2017-05-10 MED ORDER — TIOTROPIUM BROMIDE MONOHYDRATE 2.5 MCG/ACT IN AERS: 2.0000 | INHALATION_SPRAY | Freq: Every day | RESPIRATORY_TRACT | 0 refills | Status: DC

## 2017-05-10 MED ORDER — ALBUTEROL SULFATE HFA 108 (90 BASE) MCG/ACT IN AERS: 2.0000 | INHALATION_SPRAY | RESPIRATORY_TRACT | 5 refills | Status: DC | PRN

## 2017-05-10 NOTE — Progress Notes (Signed)
Subjective:    Patient ID: Rachel [name], female    DOB: 07/25/1950, 67 y.o.   MRN: [id number]  HPI 67 year old smoker (50 pack years) with a history of hypertension, coronary artery disease, peripheral vascular disease, diabetes, allergic rhinitis.  She is referred today by Dr. Wolfgang Phoenix for evaluation of persistent cough, dyspnea and probable COPD.  She reports that she started to notice exertional SOB with previously easy tasks. She has had a worsening of a chronic cough and sinus drainage, hoarse voice. She has controlled GERD, has some occasional dysphagia. She has  A lot of nasal congestion.   Pulmonary function testing was done 03/29/17 and I have reviewed.  This shows moderate obstruction with out a bronchodilator response, normal lung volumes, severely decreased diffusion capacity that does not correct when adjusted for alveolar volume.  Review of Systems  Constitutional: Positive for appetite change. Negative for fever and unexpected weight change.  HENT: Positive for congestion, sneezing, trouble swallowing and voice change. Negative for dental problem, ear pain, nosebleeds, postnasal drip, rhinorrhea, sinus pressure and sore throat.   Eyes: Negative for redness and itching.  Respiratory: Positive for cough and shortness of breath. Negative for chest tightness and wheezing.   Cardiovascular: Negative for palpitations and leg swelling.  Gastrointestinal: Negative for nausea and vomiting.  Genitourinary: Negative for dysuria.  Musculoskeletal: Negative for joint swelling.  Skin: Negative for rash.  Neurological: Negative for headaches.  Hematological: Does not bruise/bleed easily.  Psychiatric/Behavioral: Negative for dysphoric mood. The patient is not nervous/anxious.    Past Medical History:  Diagnosis Date  . Asthma   . Coronary atherosclerosis of native coronary artery    DES x 2 (overlapping) RCA 03/2008 - patent 2011  . Diabetes type 2, controlled ([facility]) 2000  . Essential  hypertension, benign   . Fatty liver   . GERD (gastroesophageal reflux disease)   . Hyperlipidemia   . Microalbuminuria   . Obesity   . Odynophagia    Severe esophagitis on CT 05/2010, treated empirically with Diflucan  . PAD (peripheral artery disease) ([facility])    history of left greater than right lower 70 claudication  . Tubular adenoma of colon 07/2007   Multiple colonic polyps  . Venous stasis      Family History  Problem Relation Age of Onset  . Bone cancer Father   . Heart disease Maternal Grandmother   . Hypertension Mother   . Colon cancer Neg Hx   . Liver disease Neg Hx   . Inflammatory bowel disease Neg Hx      Social History   Socioeconomic History  . Marital status: Divorced    Spouse name: Not on file  . Number of children: 1  . Years of education: Not on file  . Highest education level: Not on file  Occupational History  . Occupation: Tax inspector: [employer]  . Financial resource strain: Not on file  . Food insecurity:    Worry: Not on file    Inability: Not on file  . Transportation needs:    Medical: Not on file    Non-medical: Not on file  Tobacco Use  . Smoking status: Current Every Day Smoker    Packs/day: 0.25    Years: 50.00    Pack years: 12.50    Types: Cigarettes  . Smokeless tobacco: Never Used  Substance and Sexual Activity  . Alcohol use: No  . Drug use: No  . Sexual activity:  Yes    Partners: Male    Birth control/protection: Condom    Comment: mutual friends  Lifestyle  . Physical activity:    Days per week: Not on file    Minutes per session: Not on file  . Stress: Not on file  Relationships  . Social connections:    Talks on phone: Not on file    Gets together: Not on file    Attends religious service: Not on file    Active member of club or organization: Not on file    Attends meetings of clubs or organizations: Not on file    Relationship status: Not on file  . Intimate partner violence:     Fear of current or ex partner: Not on file    Emotionally abused: Not on file    Physically abused: Not on file    Forced sexual activity: Not on file  Other Topics Concern  . Not on file  Social History Narrative  . Not on file     Allergies  Allergen Reactions  . Altace [Ramipril] Cough  . Biaxin [Clarithromycin]     Fatigue  . Niacin And Related Other (See Comments)    Flush - felt like on fire.  . Nsaids Other (See Comments)    feverish  . Adhesive [Tape] Rash    Paper tape is ok     Outpatient Medications Prior to Visit  Medication Sig Dispense Refill  . aspirin EC 81 MG tablet Take 81 mg by mouth daily.      . blood glucose meter kit and supplies KIT Dispense based on patient and insurance preference. Use up to four times daily as directed. (FOR ICD-10 E11.65) Bayer Contour 1 each 0  . clonazePAM (KLONOPIN) 0.5 MG tablet One po Qhs prn sleep 30 tablet 5  . clopidogrel (PLAVIX) 75 MG tablet Take 1 tablet (75 mg total) by mouth daily. 90 tablet 3  . Continuous Blood Gluc Sensor (FREESTYLE LIBRE SENSOR SYSTEM) MISC USE ONE SENSOR EVERY 10 DAYS. 3 each 2  . gabapentin (NEURONTIN) 400 MG capsule TAKE ONE CAPSULE BY MOUTH AT BEDTIME AS DIRECTED  2  . glucose blood (BAYER CONTOUR NEXT TEST) test strip Use as directed 4 x daily. E11.65 150 each 5  . hydrochlorothiazide (MICROZIDE) 12.5 MG capsule TAKE 1 CAPSULE (12.5 MG TOTAL) BY MOUTH DAILY. 90 capsule 1  . insulin aspart (NOVOLOG FLEXPEN) 100 UNIT/ML FlexPen INJECT 14-20 UNITS 3 TIMES DAILY WITH MEALS INTO THE SKIN 45 mL 0  . Insulin Glargine (TOUJEO MAX SOLOSTAR) 300 UNIT/ML SOPN Inject 50 Units into the skin at bedtime. 2 pen 2  . Insulin Pen Needle (B-D ULTRAFINE III SHORT PEN) 31G X 8 MM MISC 1 each by Does not apply route as directed. 150 each 3  . Lancets 30G MISC Test 4 x daily. E11.65. Bayer contour next 150 each 5  . levothyroxine (SYNTHROID, LEVOTHROID) 100 MCG tablet TAKE 1 TABLET BY MOUTH DAILY BEFORE BREAKFAST 90  tablet 0  . meclizine (ANTIVERT) 25 MG tablet Take 25 mg by mouth as needed for dizziness.    Marland Kitchen oxybutynin (DITROPAN-XL) 10 MG 24 hr tablet Take 2 tablets (20 mg total) by mouth daily. 180 tablet 1  . pantoprazole (PROTONIX) 40 MG tablet Take 1 tablet (40 mg total) by mouth daily. 90 tablet 0  . potassium chloride SA (KLOR-CON M20) 20 MEQ tablet Take 1 tablet (20 mEq total) by mouth 2 (two) times daily. 60 tablet 5  .  rosuvastatin (CRESTOR) 40 MG tablet Take 1 tablet (40 mg total) by mouth at bedtime. 90 tablet 1  . Vitamin D, Ergocalciferol, (DRISDOL) 50000 units CAPS capsule Take 1 capsule (50,000 Units total) by mouth every 7 (seven) days. 12 capsule 0   No facility-administered medications prior to visit.         Objective:   Physical Exam Vitals:   05/10/17 1124  BP: 130/82  Pulse: 90  SpO2: 98%  Weight: 156 lb (70.8 kg)  Height: _0  (1.6 m)    Gen: Pleasant, well-nourished, in no distress,  normal affect  ENT: No lesions,  mouth clear,  oropharynx clear, no postnasal drip, hoarse voice  Neck: No JVD, no stridor  Lungs: No use of accessory muscles,clear without rales or rhonchi  Cardiovascular: RRR, heart sounds normal, no murmur or gallops, no peripheral edema  Musculoskeletal: No deformities, no cyanosis or clubbing  Neuro: alert, non focal  Skin: Warm, no lesions or rashes      Assessment & Plan:  TOBACCO ABUSE Cessation with her today.  Also discussed referral to the lung cancer screening program.  Based on her pulmonary function testing and her history she is a good candidate for this.  She agrees and I will refer her today.  COPD (chronic obstructive pulmonary disease) ([facility]) Medical history consistent with COPD.  Her pulmonary function testing shows moderate obstruction without a bronchodilator response, significantly decreased diffusion capacity.  We will perform walking oximetry today to ensure she does not desaturate.  I will start her empirically on  Spiriva Respimat to see if she benefits.  We will also start her on albuterol to use as needed.  We discussed the difference between maintenance and as needed inhaler therapy today.  Also discussed smoking cessation as above.  Baltazar Apo, MD, PhD 05/10/2017, 12:01 PM [facility] [phone] or if no answer [phone]

## 2017-05-10 NOTE — Patient Instructions (Addendum)
We will try starting Spiriva Respimat 2 puffs once a day.  We will order albuterol for you to use 2 puffs up to every 4 hours if needed for chest tightness, wheezing, shortness of breath. We will refer you to the lung cancer screening program Agree that you need to work on decreasing your smoking.  Our goal will be to ultimately quit altogether. Please continue your Protonix as you take it. Recommend trying loratadine 10 mg (Claritin) once daily for your nasal congestion and hoarseness You could also consider adding fluticasone nasal spray (Flonase) 2 sprays each nostril once a day if your congestion and hoarseness persist. Follow with Dr Lamonte Sakai in 1 month or next available so that we can review your status on the new medications.

## 2017-05-10 NOTE — Assessment & Plan Note (Signed)
Cessation with her today.  Also discussed referral to the lung cancer screening program.  Based on her pulmonary function testing and her history she is a good candidate for this.  She agrees and I will refer her today.

## 2017-05-10 NOTE — Assessment & Plan Note (Signed)
Medical history consistent with COPD.  Her pulmonary function testing shows moderate obstruction without a bronchodilator response, significantly decreased diffusion capacity.  We will perform walking oximetry today to ensure she does not desaturate.  I will start her empirically on Spiriva Respimat to see if she benefits.  We will also start her on albuterol to use as needed.  We discussed the difference between maintenance and as needed inhaler therapy today.  Also discussed smoking cessation as above.

## 2017-05-11 LAB — COMPLETE METABOLIC PANEL WITH GFR
AG RATIO: 1.2 (calc) (ref 1.0–2.5)
ALBUMIN MSPROF: 4.1 g/dL (ref 3.6–5.1)
ALT: 6 U/L (ref 6–29)
AST: 11 U/L (ref 10–35)
Alkaline phosphatase (APISO): 140 U/L — ABNORMAL HIGH (ref 33–130)
BILIRUBIN TOTAL: 0.4 mg/dL (ref 0.2–1.2)
BUN / CREAT RATIO: 9 (calc) (ref 6–22)
BUN: 9 mg/dL (ref 7–25)
CHLORIDE: 105 mmol/L (ref 98–110)
CO2: 25 mmol/L (ref 20–32)
Calcium: 9.4 mg/dL (ref 8.6–10.4)
Creat: 1.02 mg/dL — ABNORMAL HIGH (ref 0.50–0.99)
GFR, EST AFRICAN AMERICAN: 67 mL/min/{1.73_m2} (ref >=60)
GFR, Est Non African American: 58 mL/min/{1.73_m2} — ABNORMAL LOW (ref >=60)
Globulin: 3.4 g/dL (calc) (ref 1.9–3.7)
Glucose, Bld: 142 mg/dL — ABNORMAL HIGH (ref 65–139)
POTASSIUM: 3.9 mmol/L (ref 3.5–5.3)
Sodium: 140 mmol/L (ref 135–146)
TOTAL PROTEIN: 7.5 g/dL (ref 6.1–8.1)

## 2017-05-11 LAB — HEMOGLOBIN A1C
Hgb A1c MFr Bld: 7.6 % of total Hgb — ABNORMAL HIGH (ref <5.7)
Mean Plasma Glucose: 171 (calc)
eAG (mmol/L): 9.5 (calc)

## 2017-05-11 LAB — TSH: TSH: 0.62 mIU/L (ref 0.40–4.50)

## 2017-05-11 LAB — T4, FREE: Free T4: 1.8 ng/dL (ref 0.8–1.8)

## 2017-05-11 LAB — VITAMIN D 25 HYDROXY (VIT D DEFICIENCY, FRACTURES): VIT D 25 HYDROXY: 49 ng/mL (ref 30–100)

## 2017-05-17 ENCOUNTER — Encounter: Payer: Self-pay | Admitting: "Endocrinology

## 2017-05-17 ENCOUNTER — Ambulatory Visit: Payer: BLUE CROSS/BLUE SHIELD | Admitting: "Endocrinology

## 2017-05-17 VITALS — BP 133/72 | HR 99 | Ht 63.0 in | Wt 160.0 lb

## 2017-05-17 DIAGNOSIS — E782 Mixed hyperlipidemia: Secondary | ICD-10-CM | POA: Diagnosis not present

## 2017-05-17 DIAGNOSIS — I10 Essential (primary) hypertension: Secondary | ICD-10-CM | POA: Diagnosis not present

## 2017-05-17 DIAGNOSIS — E1159 Type 2 diabetes mellitus with other circulatory complications: Secondary | ICD-10-CM

## 2017-05-17 DIAGNOSIS — E039 Hypothyroidism, unspecified: Secondary | ICD-10-CM | POA: Diagnosis not present

## 2017-05-17 MED ORDER — INSULIN GLARGINE 300 UNIT/ML ~~LOC~~ SOPN: 50.0000 [IU] | PEN_INJECTOR | Freq: Every day | SUBCUTANEOUS | 2 refills | Status: DC

## 2017-05-17 NOTE — Patient Instructions (Signed)

## 2017-05-17 NOTE — Progress Notes (Signed)
Subjective:    Patient ID: Rachel [name], female    DOB: 02-Nov-1950, PCP Mikey Kirschner, MD   Past Medical History:  Diagnosis Date  . Asthma   . Coronary atherosclerosis of native coronary artery    DES x 2 (overlapping) RCA 03/2008 - patent 2011  . Diabetes type 2, controlled ([place]) 2000  . Essential hypertension, benign   . Fatty liver   . GERD (gastroesophageal reflux disease)   . Hyperlipidemia   . Microalbuminuria   . Obesity   . Odynophagia    Severe esophagitis on CT 05/2010, treated empirically with Diflucan  . PAD (peripheral artery disease) (HCC)    history of left greater than right lower 70 claudication  . Tubular adenoma of colon 07/2007   Multiple colonic polyps  . Venous stasis    Past Surgical History:  Procedure Laterality Date  . CARDIAC CATHETERIZATION     stents  . CHOLECYSTECTOMY N/A 07/30/2015   Procedure: LAPAROSCOPIC CHOLECYSTECTOMY;  Surgeon: Aviva Signs, MD;  Location: [facility];  Service: General;  Laterality: N/A;  . COLONOSCOPY W/ POLYPECTOMY  07/2007   Tubular adenoma  . CORONARY STENT PLACEMENT    . CYSTECTOMY     Pilonidal  . ESOPHAGOGASTRODUODENOSCOPY  04/2010   RMR: Normal esophagus , Stomach D1 and D2   . LOWER EXTREMITY ANGIOGRAPHY N/A 03/13/2016   Procedure: Lower Extremity Angiography;  Surgeon: [name], MD;  Location: [facility];  Service: Cardiovascular;  Laterality: N/A;  . PERIPHERAL VASCULAR ATHERECTOMY  03/27/2016   Procedure: Peripheral Vascular Atherectomy;  Surgeon: [name], MD;  Location: [facility];  Service: Cardiovascular;;  Attempted atherectomy  . PERIPHERAL VASCULAR CATHETERIZATION Bilateral 09/28/2014   Procedure: Lower Extremity Angiography;  Surgeon: [name], MD;  Location: [facility];  Service: Cardiovascular;  Laterality: Bilateral;  . PERIPHERAL VASCULAR CATHETERIZATION N/A 09/28/2014   Procedure: Abdominal Aortogram;  Surgeon: [name], MD;  Location: [facility];  Service: Cardiovascular;  Laterality: N/A;  . PERIPHERAL VASCULAR CATHETERIZATION N/A 10/15/2014   Procedure: Lower Extremity Angiography;  Surgeon: [name], MD;  Location: [facility];  Service: Cardiovascular;  Laterality: N/A;  . PERIPHERAL VASCULAR CATHETERIZATION  10/15/2014   Procedure: Peripheral Vascular Intervention;  Surgeon: [name], MD;  Location: [facility];  Service: Cardiovascular;;  bilateral iliac stents  . PILONIDAL CYST / SINUS EXCISION    . TUBAL LIGATION Bilateral    Social History   Socioeconomic History  . Marital status: Divorced    Spouse name: Not on file  . Number of children: 1  . Years of education: Not on file  . Highest education level: Not on file  Occupational History  . Occupation: Tax inspector: [employer]  . Financial resource strain: Not on file  . Food insecurity:    Worry: Not on file    Inability: Not on file  . Transportation needs:    Medical: Not on file    Non-medical: Not on file  Tobacco Use  . Smoking status: Current Every Day Smoker    Packs/day: 0.25    Years: 50.00    Pack years: 12.50    Types: Cigarettes  . Smokeless tobacco: Never Used  Substance and Sexual Activity  . Alcohol use: No  . Drug use: No  . Sexual activity: Yes    Partners: Male  Birth control/protection: Condom    Comment: mutual friends  Lifestyle  . Physical activity:    Days per week: Not on file    Minutes per session: Not on file  . Stress: Not on file  Relationships  . Social connections:    Talks on phone: Not on file    Gets together: Not on file    Attends religious service: Not on file    Active member of club or organization: Not on file    Attends meetings of clubs or organizations: Not on file    Relationship status: Not on file  Other Topics Concern  . Not on file  Social History Narrative  . Not on file   Outpatient Encounter Medications as of  05/17/2017  Medication Sig  . albuterol (PROAIR HFA) 108 (90 Base) MCG/ACT inhaler Inhale 2 puffs into the lungs every 4 (four) hours as needed for wheezing or shortness of breath.  Marland Kitchen aspirin EC 81 MG tablet Take 81 mg by mouth daily.    . blood glucose meter kit and supplies KIT Dispense based on patient and insurance preference. Use up to four times daily as directed. (FOR ICD-10 E11.65) Bayer Contour  . clonazePAM (KLONOPIN) 0.5 MG tablet One po Qhs prn sleep  . clopidogrel (PLAVIX) 75 MG tablet Take 1 tablet (75 mg total) by mouth daily.  . Continuous Blood Gluc Sensor (FREESTYLE LIBRE SENSOR SYSTEM) MISC USE ONE SENSOR EVERY 10 DAYS.  Marland Kitchen gabapentin (NEURONTIN) 400 MG capsule TAKE ONE CAPSULE BY MOUTH AT BEDTIME AS DIRECTED  . glucose blood (BAYER CONTOUR NEXT TEST) test strip Use as directed 4 x daily. E11.65  . hydrochlorothiazide (MICROZIDE) 12.5 MG capsule TAKE 1 CAPSULE (12.5 MG TOTAL) BY MOUTH DAILY.  Marland Kitchen insulin aspart (NOVOLOG FLEXPEN) 100 UNIT/ML FlexPen INJECT 14-20 UNITS 3 TIMES DAILY WITH MEALS INTO THE SKIN  . Insulin Glargine (TOUJEO MAX SOLOSTAR) 300 UNIT/ML SOPN Inject 50 Units into the skin at bedtime.  . Insulin Pen Needle (B-D ULTRAFINE III SHORT PEN) 31G X 8 MM MISC 1 each by Does not apply route as directed.  . Lancets 30G MISC Test 4 x daily. E11.65. Bayer contour next  . levothyroxine (SYNTHROID, LEVOTHROID) 100 MCG tablet TAKE 1 TABLET BY MOUTH DAILY BEFORE BREAKFAST  . meclizine (ANTIVERT) 25 MG tablet Take 25 mg by mouth as needed for dizziness.  Marland Kitchen oxybutynin (DITROPAN-XL) 10 MG 24 hr tablet Take 2 tablets (20 mg total) by mouth daily.  . pantoprazole (PROTONIX) 40 MG tablet Take 1 tablet (40 mg total) by mouth daily.  . potassium chloride SA (KLOR-CON M20) 20 MEQ tablet Take 1 tablet (20 mEq total) by mouth 2 (two) times daily.  . rosuvastatin (CRESTOR) 40 MG tablet Take 1 tablet (40 mg total) by mouth at bedtime.  . Tiotropium Bromide Monohydrate (SPIRIVA RESPIMAT)  2.5 MCG/ACT AERS Inhale 2 puffs into the lungs daily.  . Vitamin D, Ergocalciferol, (DRISDOL) 50000 units CAPS capsule Take 1 capsule (50,000 Units total) by mouth every 7 (seven) days.  . [DISCONTINUED] Insulin Glargine (TOUJEO MAX SOLOSTAR) 300 UNIT/ML SOPN Inject 50 Units into the skin at bedtime.   No facility-administered encounter medications on file as of 05/17/2017.    ALLERGIES: Allergies  Allergen Reactions  . Altace [Ramipril] Cough  . Biaxin [Clarithromycin]     Fatigue  . Niacin And Related Other (See Comments)    Flush - felt like on fire.  . Nsaids Other (See Comments)    feverish  .  Adhesive [Tape] Rash    Paper tape is ok   VACCINATION STATUS: Immunization History  Administered Date(s) Administered  . Influenza,inj,Quad PF,6+ Mos 10/18/2015, 11/16/2016  . Influenza-Unspecified 11/07/2007, 12/07/2013  . Pneumococcal Polysaccharide-23 12/06/2006    Diabetes  She presents for her follow-up diabetic visit. She has type 2 diabetes mellitus. Onset time: She was diagnosed at approximate age of 48 years. Her disease course has been improving. There are no hypoglycemic associated symptoms. Pertinent negatives for hypoglycemia include no confusion, headaches, pallor or seizures. Associated symptoms include blurred vision, fatigue, foot paresthesias and foot ulcerations. Pertinent negatives for diabetes include no chest pain, no polydipsia, no polyphagia and no polyuria. There are no hypoglycemic complications. Symptoms are improving. Diabetic complications include autonomic neuropathy, nephropathy, PVD and retinopathy. Risk factors for coronary artery disease include diabetes mellitus, dyslipidemia, hypertension, sedentary lifestyle and tobacco exposure. Current diabetic treatments: She says she is on Lantus 82 units nightly, NovoLog to 10 units 3 times a day before meals, metformin 500 mg by mouth twice a day. She is compliant with treatment most of the time. Her weight is  fluctuating minimally. She is following a generally unhealthy diet. When asked about meal planning, she reported none. She has not had a previous visit with a dietitian. She never participates in exercise. Her home blood glucose trend is fluctuating minimally. Her breakfast blood glucose range is generally 130-140 mg/dl. Her lunch blood glucose range is generally 130-140 mg/dl. Her dinner blood glucose range is generally 130-140 mg/dl. Her bedtime blood glucose range is generally 130-140 mg/dl. Her overall blood glucose range is 130-140 mg/dl. (She is currently using continuous glucose monitoring, the [place] device, showing 83% time in range and 12% above target,  Her average blood glucose is 130 for the last month. - Her recent A1c is  7.6% , generally improving from 9.1%.) An ACE inhibitor/angiotensin II receptor blocker is contraindicated. She sees a podiatrist.Eye exam is current.  Hyperlipidemia  This is a chronic problem. The current episode started more than 1 year ago. The problem is uncontrolled. Recent lipid tests were reviewed and are variable. Exacerbating diseases include diabetes, hypothyroidism and obesity. Pertinent negatives include no chest pain, myalgias or shortness of breath. Current antihyperlipidemic treatment includes statins. Risk factors for coronary artery disease include dyslipidemia, family history, hypertension, diabetes mellitus and a sedentary lifestyle.  Hypertension  This is a chronic problem. The current episode started more than 1 year ago. The problem is controlled. Associated symptoms include blurred vision. Pertinent negatives include no chest pain, headaches, palpitations or shortness of breath. Risk factors for coronary artery disease include dyslipidemia, diabetes mellitus, smoking/tobacco exposure and sedentary lifestyle. Past treatments include diuretics. Hypertensive end-organ damage includes kidney disease, PVD and retinopathy.     Review of Systems   Constitutional: Positive for fatigue and unexpected weight change. Negative for chills and fever.  HENT: Negative for trouble swallowing and voice change.   Eyes: Positive for blurred vision. Negative for visual disturbance.  Respiratory: Negative for cough, shortness of breath and wheezing.   Cardiovascular: Negative for chest pain, palpitations and leg swelling.  Gastrointestinal: Negative for diarrhea, nausea and vomiting.  Endocrine: Negative for cold intolerance, heat intolerance, polydipsia, polyphagia and polyuria.  Musculoskeletal: Positive for gait problem. Negative for arthralgias and myalgias.  Skin: Negative for color change, pallor, rash and wound.  Neurological: Negative for seizures and headaches.  Psychiatric/Behavioral: Negative for confusion and suicidal ideas.    Objective:    BP 133/72   Pulse 99  Ht _0  (1.6 m)   Wt 160 lb (72.6 kg)   BMI 28.34 kg/m   Wt Readings from Last 3 Encounters:  05/17/17 160 lb (72.6 kg)  05/10/17 156 lb (70.8 kg)  03/29/17 160 lb (72.6 kg)    Physical Exam  Constitutional: She is oriented to person, place, and time. She appears well-developed.  HENT:  Head: Normocephalic and atraumatic.  Eyes: EOM are normal.  Neck: Normal range of motion. Neck supple. No tracheal deviation present. No thyromegaly present.  Cardiovascular: Normal rate.  Pulses:      Dorsalis pedis pulses are 0 on the right side, and 0 on the left side.       Posterior tibial pulses are 0 on the right side, and 0 on the left side.  Pulmonary/Chest: Effort normal.  Abdominal: There is no tenderness. There is no guarding.  Musculoskeletal: Normal range of motion. She exhibits no edema.  Neurological: She is alert and oriented to person, place, and time. She has normal reflexes. No cranial nerve deficit. Coordination normal.  Skin: Skin is warm and dry. No rash noted. No erythema. No pallor.  Psychiatric: She has a normal mood and affect. Judgment normal.     CMP     Component Value Date/Time   NA 140 05/10/2017 1505   NA 135 09/29/2016 0824   K 3.9 05/10/2017 1505   CL 105 05/10/2017 1505   CO2 25 05/10/2017 1505   GLUCOSE 142 (H) 05/10/2017 1505   BUN 9 05/10/2017 1505   BUN 15 09/29/2016 0824   CREATININE 1.02 (H) 05/10/2017 1505   CALCIUM 9.4 05/10/2017 1505   PROT 7.5 05/10/2017 1505   PROT 7.3 09/29/2016 0824   ALBUMIN 3.9 09/29/2016 0824   AST 11 05/10/2017 1505   ALT 6 05/10/2017 1505   ALKPHOS 157 (H) 09/29/2016 0824   BILITOT 0.4 05/10/2017 1505   BILITOT <0.2 09/29/2016 0824   GFRNONAA 58 (L) 05/10/2017 1505   GFRAA 67 05/10/2017 1505     Diabetic Labs (most recent): Lab Results  Component Value Date   HGBA1C 7.6 (H) 05/10/2017   HGBA1C 8.8 (H) 02/08/2017   HGBA1C 9.1 (H) 06/07/2016     Lipid Panel ( most recent) Lipid Panel     Component Value Date/Time   CHOL 167 06/07/2016 0852   TRIG 182 (H) 06/07/2016 0852   HDL 30 (L) 06/07/2016 0852   CHOLHDL 5.8 (H) 04/17/2015 0908   CHOLHDL 3.6 04/18/2014 0833   VLDL 23 04/18/2014 0833   LDLCALC 101 (H) 06/07/2016 0852      Assessment & Plan:   1. DM type 2 causing vascular disease ([facility]), Peripheral arterial disease which required bypass graft, retinopathy, nephropathy  -  She has engaged better, has hx of  alarming noncompliance/nonadherence. She remains at a high risk for more acute and chronic complications of diabetes which include CAD, CVA, CKD, retinopathy, and neuropathy. These are all discussed in detail with the patient.  Patient came with improving glucose profile while using continuous glucose monitoring since her last visit. - Her recent labs show A1c of  7.6%,  slowly improving from 9.1% in May 2018.   Recent labs reviewed, showing improving renal function. - I have re-counseled the patient on diet management and   by adopting a carbohydrate restricted / protein rich  Diet.   -  Suggestion is made for her to avoid simple carbohydrates   from her diet including Cakes, Sweet Desserts / Pastries, Ice Cream, Soda (  diet and regular), Sweet Tea, Candies, Chips, Cookies, Store Bought Juices, Alcohol in Excess of  1-2 drinks a day, Artificial Sweeteners, and "Sugar-free" Products. This will help patient to have stable blood glucose profile and potentially avoid unintended weight gain.   - Patient is advised to stick to a routine mealtimes to eat 3 meals  a day and avoid unnecessary snacks ( to snack only to correct hypoglycemia).   - I have approached patient with the following individualized plan to manage diabetes and patient agrees.  - She came with better commitment while using her continuous glucose monitor, average blood glucose on target at 130, 12% above target and 83% time in range.    - I advised her to continue  Toujeo at 50 units  daily at bedtime, continue  NovoLog  14 units 3 times a day before meals for pre-meal BG readings of 90-166m/dl, plus patient specific correction dose of rapid acting insulin  for unexpected hyperglycemia above 1561mdl, associated with strict monitoring of glucose  4 times a day.  - Patient is warned not to take insulin without proper monitoring per orders. -Adjustment parameters are given for hypo and hyperglycemia in writing. -Patient is encouraged to call clinic for blood glucose levels less than 70 or above 300 mg /dl.  - At this time, patient is not a candidate for metformin, SGLT2 inhibitors due to CKD and severe peripheral arterial disease which required bypass surgery. - Insulin is the safest choice of therapy for her diabetes at this time.  - Patient specific target  for A1c; LDL, HDL, Triglycerides, and  Waist Circumference were discussed in detail.  2) BP/HTN: her blood pressure is controlled to target.  Continue current medications including hydrochlorothiazide, she is allergic to ACE inhibitors.  3) Lipids/HPL: Uncontrolled with LDL 110.  Patient is advised to continue  statins. 4)  Weight/Diet:  her weight is stabilizing around 160 pounds over the last 3 visits. CDE consult will be initiated, and carbohydrates information provided.  5) hypothyroidism: Her thyroid function tests are consistent with appropriate replacement at this time. I advised her to continue her levothyroxine 100 g by mouth every morning.    - We discussed about correct intake of levothyroxine, at fasting, with water, separated by at least 30 minutes from breakfast, and separated by more than 4 hours from calcium, iron, multivitamins, acid reflux medications (PPIs). -Patient is made aware of the fact that thyroid hormone replacement is needed for life, dose to be adjusted by periodic monitoring of thyroid function tests.  6) vitamin D deficiency: - I discussed and initiated vitamin D 3 5000 units daily for the next 90 days.  7) Chronic Care/Health Maintenance:  -Patient is Statin medications and encouraged to continue to follow up with Ophthalmology, Podiatrist at least yearly or according to recommendations, and advised to quit smoking. I have recommended yearly flu vaccine and pneumonia vaccination at least every 5 years; moderate intensity exercise for up to 150 minutes weekly; and  sleep for at least 7 hours a day.  - Time spent with the patient: 25 min, of which >50% was spent in reviewing her sugar logs , discussing her hypo- and hyper-glycemic episodes, reviewing her current and  previous labs and insulin doses and developing a plan to avoid hypo- and hyper-glycemia.   - I advised patient to maintain close follow up with LuMikey KirschnerMD for primary care needs.  - Time spent with the patient: 25 min, of which >50% was spent in  reviewing her blood glucose logs , discussing her hypo- and hyper-glycemic episodes, reviewing her current and  previous labs and insulin doses and developing a plan to avoid hypo- and hyper-glycemia. Please refer to Patient Instructions for Blood Glucose  Monitoring and Insulin/Medications Dosing Guide"  in media tab for additional information. Rachel [name] participated in the discussions, expressed understanding, and voiced agreement with the above plans.  All questions were answered to her satisfaction. she is encouraged to contact clinic should she have any questions or concerns prior to her return visit.   Follow up plan: -Return in about 3 months (around 08/16/2017) for meter, and logs.  Glade Lloyd, MD Phone: [phone]  Fax: [phone]  This note was partially dictated with voice recognition software. Similar sounding words can be transcribed inadequately or may not  be corrected upon review.  05/17/2017, 12:57 PM

## 2017-05-21 ENCOUNTER — Other Ambulatory Visit: Payer: Self-pay

## 2017-05-21 MED ORDER — FREESTYLE LIBRE 14 DAY SENSOR MISC: 1.0000 | 5 refills | Status: DC

## 2017-05-21 MED ORDER — FREESTYLE LIBRE 14 DAY READER DEVI: 1.0000 | 0 refills | Status: DC

## 2017-05-22 ENCOUNTER — Telehealth: Payer: Self-pay | Admitting: Family Medicine

## 2017-05-22 NOTE — Telephone Encounter (Signed)
Left message to return call to get more info.  

## 2017-05-22 NOTE — Telephone Encounter (Signed)
Patient is requesting refill on meclizine 25 mg,states she had been feeling dizzy and this helps.[facility] [place]

## 2017-05-23 ENCOUNTER — Other Ambulatory Visit: Payer: Self-pay | Admitting: *Deleted

## 2017-05-23 MED ORDER — MECLIZINE HCL 25 MG PO TABS: 25.0000 mg | ORAL_TABLET | Freq: Three times a day (TID) | ORAL | 2 refills | Status: AC | PRN

## 2017-05-23 NOTE — Telephone Encounter (Signed)
Patient states she has history of vertigo and dizziness -Patient states she is not currently having dizzy issues but is out of the medication and getting ready to go out of town and would like a refill to take with her just in case.

## 2017-05-23 NOTE — Telephone Encounter (Signed)
Ok numb 30 one tid prn two ref

## 2017-05-23 NOTE — Telephone Encounter (Signed)
Refill sent to pharm. Pt notified on voicemail.

## 2017-05-27 ENCOUNTER — Other Ambulatory Visit: Payer: Self-pay | Admitting: "Endocrinology

## 2017-06-05 ENCOUNTER — Telehealth: Payer: Self-pay | Admitting: Emergency Medicine

## 2017-06-05 NOTE — Telephone Encounter (Signed)
Called and spoke to patient. Patient stated the Spiriva Respimat has been working well for her but that it is not affordable. Patient stated she will call [employer] and find out what the formulary is for medications and let us know what will be more affordable. Patient stated she will call us back on 5/1.

## 2017-06-07 NOTE — Telephone Encounter (Signed)
lmtcb for pt to follow up on covered alternative.

## 2017-06-08 NOTE — Telephone Encounter (Signed)
ATC pt, no answer. Left message for pt to call back.  

## 2017-06-11 NOTE — Telephone Encounter (Signed)
Called and spoke with patient she states that she is calling [employer] and will call us back.

## 2017-06-13 NOTE — Telephone Encounter (Signed)
Spoke with pt, she is going to call today, then she will call us back to let us know.

## 2017-06-14 MED ORDER — TIOTROPIUM BROMIDE MONOHYDRATE 2.5 MCG/ACT IN AERS: 2.0000 | INHALATION_SPRAY | Freq: Every day | RESPIRATORY_TRACT | 5 refills | Status: DC

## 2017-06-14 NOTE — Telephone Encounter (Signed)
Called and spoke to Rachel [name], and was advised that pt's spiriva IS covered by insurance and is only $40 per month. Called and spoke to pt and informed her. Pt verbalized understanding and denied any further questions or concerns at this time.

## 2017-06-19 ENCOUNTER — Ambulatory Visit: Payer: BLUE CROSS/BLUE SHIELD | Admitting: Emergency Medicine

## 2017-06-20 ENCOUNTER — Encounter: Payer: Self-pay | Admitting: Cardiovascular Disease

## 2017-06-20 ENCOUNTER — Ambulatory Visit: Payer: BLUE CROSS/BLUE SHIELD | Admitting: Cardiovascular Disease

## 2017-06-20 VITALS — BP 124/68 | HR 91 | Wt 154.0 lb

## 2017-06-20 DIAGNOSIS — E782 Mixed hyperlipidemia: Secondary | ICD-10-CM | POA: Diagnosis not present

## 2017-06-20 DIAGNOSIS — I2510 Atherosclerotic heart disease of native coronary artery without angina pectoris: Secondary | ICD-10-CM | POA: Diagnosis not present

## 2017-06-20 DIAGNOSIS — I10 Essential (primary) hypertension: Secondary | ICD-10-CM | POA: Diagnosis not present

## 2017-06-20 DIAGNOSIS — I6521 Occlusion and stenosis of right carotid artery: Secondary | ICD-10-CM | POA: Diagnosis not present

## 2017-06-20 DIAGNOSIS — I739 Peripheral vascular disease, unspecified: Secondary | ICD-10-CM

## 2017-06-20 DIAGNOSIS — I779 Disorder of arteries and arterioles, unspecified: Secondary | ICD-10-CM | POA: Insufficient documentation

## 2017-06-20 NOTE — Patient Instructions (Signed)
Medication Instructions: Your physician recommends that you continue on your current medications as directed. Please refer to the Current Medication list given to you today.  Labwork: Your physician recommends that you return for a FASTING lipid profile and hepatic function panel today.   Testing/Procedures: Your physician has requested that you have a lower extremity arterial duplex. During this test, ultrasound is used to evaluate arterial blood flow in the legs. Allow one hour for this exam. There are no restrictions or special instructions.  Your physician has requested that you have a aorta and iliac duplex. During this test, an ultrasound is used to evaluate blood flow to the aorta and iliac arteries. Allow one hour for this exam. Do not eat after midnight the day before and avoid carbonated beverages.   Your physician has requested that you have an ankle brachial index (ABI). During this test an ultrasound and blood pressure cuff are used to evaluate the arteries that supply the arms and legs with blood. Allow thirty minutes for this exam. There are no restrictions or special instructions.  Follow-Up: Your physician wants you to follow-up in: 1 year with Dr. Gwenlyn Found. You will receive a reminder letter in the mail two months in advance. If you don't receive a letter, please call our office to schedule the follow-up appointment.  If you need a refill on your cardiac medications before your next appointment, please call your pharmacy.

## 2017-06-20 NOTE — Assessment & Plan Note (Signed)
History of ongoing tobacco abuse of 5 cigarettes a day trying to quit.

## 2017-06-20 NOTE — Assessment & Plan Note (Signed)
History of moderate right ICA stenosis by duplex ultrasound 04/12/17 which will be followed on an annual basis.

## 2017-06-20 NOTE — Assessment & Plan Note (Signed)
The artery disease with RCA stenting by myself February 2010 with overlapping drug-eluting stents in the RCA with cath the following year revealing this to be widely patent.  She denies chest pain but has chronic shortness of breath probably related to COPD.

## 2017-06-20 NOTE — Progress Notes (Signed)
06/20/2017 Rachel [name]   Jul 06, 1950  [id number]  Primary Physician Mikey Kirschner, MD Primary Cardiologist: [name] MD [name], [place]  HPI:  Rachel [name] is a 67 y.o.  mildly overweight divorced African-American female mother of one, grandmother of one grandchild who is referred by Dr. [name] for lifestyle limiting claudication. I last saw her in the office/2/18.Her primary care physician is Dr. [name]. She has a history of tobacco abuse smoking one pack per day for last 45 years, treated hypertension, diabetes and hyperlipidemia. She does have ischemic heart disease status post RCA intervention February 2010 with overlapping drug eluting stents in her RCA. She had repeat cath the following year revealing these to be widely patent. She complains of right lower extremity claudication claudication left greater than right over the last 2 years. 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 I reviewed this with Dr. Trula Slade who suggested that we attempt percutaneous vascular station before considering aortobifemoral bypass grafting. I angiogram on 10/15/14 and was able to cross her left common iliac chronic total occlusion. I stented both iliac arteries with I cast cover stents using "kissing stent technique". Her procedure was stopped. By a significant hematoma in the left side with a left common femoral pseudoaneurysm that was partially thrombosed initially and follow-up Doppler was completely thrombosed. Her hemoglobin stabilized not requiring transfusion. Her claudication has resolved although she still has some pain in her left hip from resolving hematoma. Her Doppler studies showed post  procedure normal ABIs bilaterally and her claudication resolved.  Since I saw her 6 months ago she is remained relatively stable.  She was told she has "COPD" and has chronic shortness of breath.  She continues to smoke 5 cigarettes a day.  She does have some lifestyle limiting claudication no evidence of critical limb ischemia.    Current Meds  Medication Sig  . albuterol (PROAIR HFA) 108 (90 Base) MCG/ACT inhaler Inhale 2 puffs into the lungs every 4 (four) hours as needed for wheezing or shortness of breath.  Marland Kitchen aspirin EC 81 MG tablet Take 81 mg by mouth daily.    . blood glucose meter kit and supplies KIT Dispense based on patient and insurance preference. Use up to four times daily as directed. (FOR ICD-10 E11.65) Bayer Contour  . clonazePAM (KLONOPIN) 0.5 MG tablet One po Qhs prn sleep  . clopidogrel (PLAVIX) 75 MG tablet Take 1 tablet (75 mg total) by mouth daily.  . Continuous Blood Gluc Receiver (FREESTYLE LIBRE 14 DAY READER) DEVI 1 each by Does not apply route every 14 (fourteen) days.  . Continuous Blood Gluc Sensor (FREESTYLE LIBRE 14 DAY SENSOR) MISC 1 each by Does not apply route every 14 (fourteen) days.  . Continuous Blood Gluc Sensor (FREESTYLE LIBRE SENSOR SYSTEM) MISC USE ONE SENSOR EVERY 10 DAYS.  Marland Kitchen gabapentin (NEURONTIN) 400 MG capsule TAKE ONE CAPSULE BY MOUTH AT BEDTIME AS DIRECTED  . glucose blood (BAYER CONTOUR NEXT TEST) test strip Use as directed 4 x daily. E11.65  . hydrochlorothiazide (MICROZIDE) 12.5 MG capsule TAKE 1 CAPSULE (12.5 MG TOTAL) BY MOUTH DAILY.  Marland Kitchen insulin aspart (NOVOLOG FLEXPEN) 100 UNIT/ML FlexPen INJECT 14-20 UNITS 3 TIMES DAILY WITH MEALS INTO  THE SKIN  . Insulin Glargine (TOUJEO MAX SOLOSTAR) 300 UNIT/ML SOPN Inject 50 Units into the skin at bedtime.  . Insulin Pen Needle (B-D ULTRAFINE III SHORT PEN) 31G X 8 MM MISC 1 each by Does not apply route as directed.  . Lancets 30G MISC Test 4 x daily. E11.65. Bayer contour next  . levothyroxine  (SYNTHROID, LEVOTHROID) 100 MCG tablet TAKE 1 TABLET BY MOUTH DAILY BEFORE BREAKFAST  . meclizine (ANTIVERT) 25 MG tablet Take 1 tablet (25 mg total) by mouth 3 (three) times daily as needed for dizziness.  Marland Kitchen oxybutynin (DITROPAN-XL) 10 MG 24 hr tablet Take 2 tablets (20 mg total) by mouth daily.  . pantoprazole (PROTONIX) 40 MG tablet Take 1 tablet (40 mg total) by mouth daily.  . potassium chloride SA (KLOR-CON M20) 20 MEQ tablet Take 1 tablet (20 mEq total) by mouth 2 (two) times daily.  . rosuvastatin (CRESTOR) 40 MG tablet Take 1 tablet (40 mg total) by mouth at bedtime.  . Tiotropium Bromide Monohydrate (SPIRIVA RESPIMAT) 2.5 MCG/ACT AERS Inhale 2 puffs into the lungs daily.  . Vitamin D, Ergocalciferol, (DRISDOL) 50000 units CAPS capsule TAKE 1 CAPSULE (50,000 UNITS TOTAL) BY MOUTH EVERY 7 (SEVEN) DAYS.     Allergies  Allergen Reactions  . Altace [Ramipril] Cough  . Biaxin [Clarithromycin]     Fatigue  . Niacin And Related Other (See Comments)    Flush - felt like on fire.  . Nsaids Other (See Comments)    feverish  . Adhesive [Tape] Rash    Paper tape is ok    Social History   Socioeconomic History  . Marital status: Divorced    Spouse name: Not on file  . Number of children: 1  . Years of education: Not on file  . Highest education level: Not on file  Occupational History  . Occupation: Tax inspector: [employer]  . Financial resource strain: Not on file  . Food insecurity:    Worry: Not on file    Inability: Not on file  . Transportation needs:    Medical: Not on file    Non-medical: Not on file  Tobacco Use  . Smoking status: Current Every Day Smoker    Packs/day: 0.25    Years: 50.00    Pack years: 12.50    Types: Cigarettes  . Smokeless tobacco: Never Used  Substance and Sexual Activity  . Alcohol use: No  . Drug use: No  . Sexual activity: Yes    Partners: Male    Birth control/protection: Condom    Comment: mutual  friends  Lifestyle  . Physical activity:    Days per week: Not on file    Minutes per session: Not on file  . Stress: Not on file  Relationships  . Social connections:    Talks on phone: Not on file    Gets together: Not on file    Attends religious service: Not on file    Active member of club or organization: Not on file    Attends meetings of clubs or organizations: Not on file    Relationship status: Not on file  . Intimate partner violence:    Fear of current or ex partner: Not on file    Emotionally abused: Not on file    Physically abused: Not on file    Forced sexual activity: Not on file  Other Topics Concern  . Not on file  Social History Narrative  .  Not on file     Review of Systems: General: negative for chills, fever, night sweats or weight changes.  Cardiovascular: negative for chest pain, dyspnea on exertion, edema, orthopnea, palpitations, paroxysmal nocturnal dyspnea or shortness of breath Dermatological: negative for rash Respiratory: negative for cough or wheezing Urologic: negative for hematuria Abdominal: negative for nausea, vomiting, diarrhea, bright red blood per rectum, melena, or hematemesis Neurologic: negative for visual changes, syncope, or dizziness All other systems reviewed and are otherwise negative except as noted above.    Blood pressure 124/68, pulse 91, weight 154 lb (69.9 kg).  General appearance: alert and no distress Neck: no adenopathy, no carotid bruit, no JVD, supple, symmetrical, trachea midline and thyroid not enlarged, symmetric, no tenderness/mass/nodules Lungs: clear to auscultation bilaterally Heart: regular rate and rhythm, S1, S2 normal, no murmur, click, rub or gallop Extremities: extremities normal, atraumatic, no cyanosis or edema Pulses: Diminished pedal pulses Skin: Skin color, texture, turgor normal. No rashes or lesions Neurologic: Alert and oriented X 3, normal strength and tone. Normal symmetric reflexes. Normal  coordination and gait  EKG not performed today  ASSESSMENT AND PLAN:   Mixed hyperlipidemia History of hyperlipidemia on statin therapy.  We will recheck a lipid and liver profile  TOBACCO ABUSE History of ongoing tobacco abuse of 5 cigarettes a day trying to quit.  Coronary atherosclerosis of native coronary artery The artery disease with RCA stenting by myself February 2010 with overlapping drug-eluting stents in the RCA with cath the following year revealing this to be widely patent.  She denies chest pain but has chronic shortness of breath probably related to COPD.  Peripheral arterial disease (HCC) History of peripheral arterial disease with stenting of bilateral iliac arteries in the past attempt at a high-grade calcified mid left SFA for critical limb ischemia with one-vessel runoff unsuccessfully using antegrade approach.  Her wound subsequently healed.  She does have some lifestyle limiting claudication  Essential hypertension, benign History of essential hypertension her blood pressure measured today 124/68.  She is on hydrochlorothiazide.  Carotid artery disease ([place]) History of moderate right ICA stenosis by duplex ultrasound 04/12/17 which will be followed on an annual basis.      [name] MD FACP,FACC,FAHA, [facility] 06/20/2017 11:41 AM

## 2017-06-20 NOTE — Assessment & Plan Note (Signed)
History of peripheral arterial disease with stenting of bilateral iliac arteries in the past attempt at a high-grade calcified mid left SFA for critical limb ischemia with one-vessel runoff unsuccessfully using antegrade approach.  Her wound subsequently healed.  She does have some lifestyle limiting claudication

## 2017-06-20 NOTE — Assessment & Plan Note (Signed)
History of hyperlipidemia on statin therapy. We will recheck a lipid and liver profile 

## 2017-06-20 NOTE — Assessment & Plan Note (Signed)
History of essential hypertension her blood pressure measured today 124/68.  She is on hydrochlorothiazide.

## 2017-06-21 ENCOUNTER — Other Ambulatory Visit: Payer: Self-pay | Admitting: Family Medicine

## 2017-06-21 ENCOUNTER — Other Ambulatory Visit: Payer: Self-pay | Admitting: "Endocrinology

## 2017-06-21 LAB — LIPID PANEL
CHOLESTEROL TOTAL: 92 mg/dL — AB (ref 100–199)
Chol/HDL Ratio: 2.9 ratio (ref 0.0–4.4)
HDL: 32 mg/dL — ABNORMAL LOW (ref >39)
LDL CALC: 41 mg/dL (ref 0–99)
Triglycerides: 95 mg/dL (ref 0–149)
VLDL Cholesterol Cal: 19 mg/dL (ref 5–40)

## 2017-06-21 LAB — HEPATIC FUNCTION PANEL
ALBUMIN: 4 g/dL (ref 3.6–4.8)
ALT: 8 IU/L (ref 0–32)
AST: 12 IU/L (ref 0–40)
Alkaline Phosphatase: 161 IU/L — ABNORMAL HIGH (ref 39–117)
BILIRUBIN TOTAL: 0.2 mg/dL (ref 0.0–1.2)
Bilirubin, Direct: 0.09 mg/dL (ref 0.00–0.40)
TOTAL PROTEIN: 7.5 g/dL (ref 6.0–8.5)

## 2017-06-26 ENCOUNTER — Encounter: Payer: Self-pay | Admitting: Emergency Medicine

## 2017-06-26 ENCOUNTER — Ambulatory Visit: Payer: BLUE CROSS/BLUE SHIELD | Admitting: Emergency Medicine

## 2017-06-26 DIAGNOSIS — J449 Chronic obstructive pulmonary disease, unspecified: Secondary | ICD-10-CM

## 2017-06-26 DIAGNOSIS — J301 Allergic rhinitis due to pollen: Secondary | ICD-10-CM

## 2017-06-26 DIAGNOSIS — J309 Allergic rhinitis, unspecified: Secondary | ICD-10-CM | POA: Insufficient documentation

## 2017-06-26 DIAGNOSIS — F17200 Nicotine dependence, unspecified, uncomplicated: Secondary | ICD-10-CM

## 2017-06-26 IMAGING — CT CT ABD-PELV W/ CM
2 of 5 series · 16 of 46 positions shown, 18 images · IV contrast (iopamidol)
Comparison: 05/23/2010

CLINICAL DATA: Upper abdominal pain beginning around 1388 hours.
Seen here for similar pain a couple of weeks ago and told it was
heartburn. Diffuse abdominal and epigastric region pain and right
upper quadrant pain.

EXAM:
CT ABDOMEN AND PELVIS WITH CONTRAST
TECHNIQUE: Multidetector CT imaging of the abdomen and pelvis was performed
using the standard protocol following bolus administration of
intravenous contrast.
CONTRAST:  100mL [id number] IOPAMIDOL ([id number]) INJECTION 61%

[Series 2: routine abd pel with · axial · 0.70mm/px · z∈[-786,-411]mm · 13 of 85 slices shown, 15 images]
[im 5/85  soft-tissue]
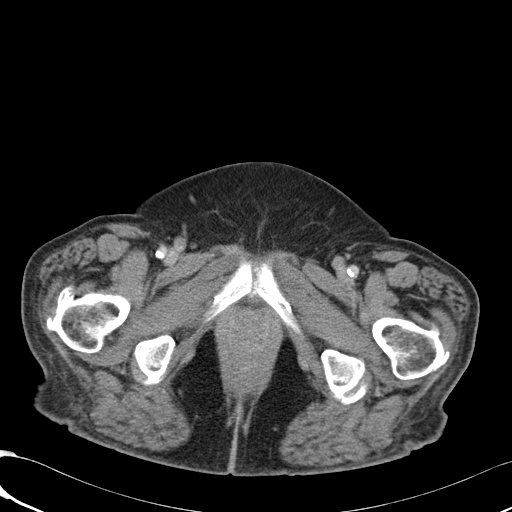
[im 5/85  bone]
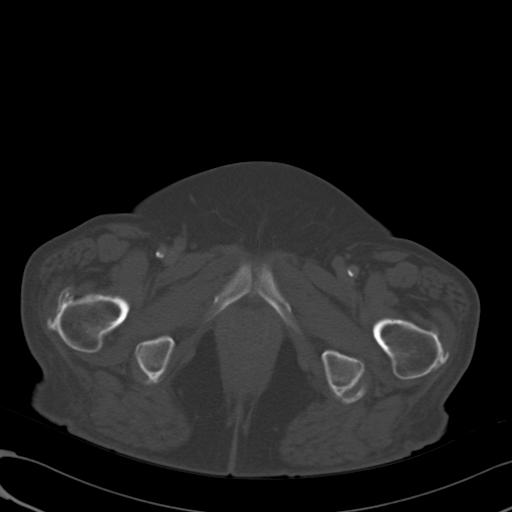
[im 10/85  soft-tissue]
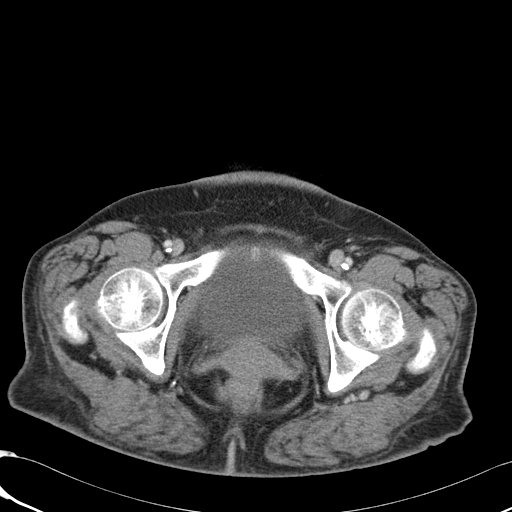
[im 19/85  soft-tissue]
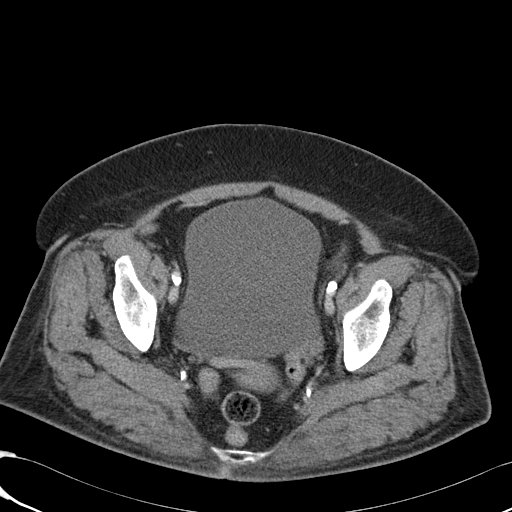
[im 24/85  soft-tissue]
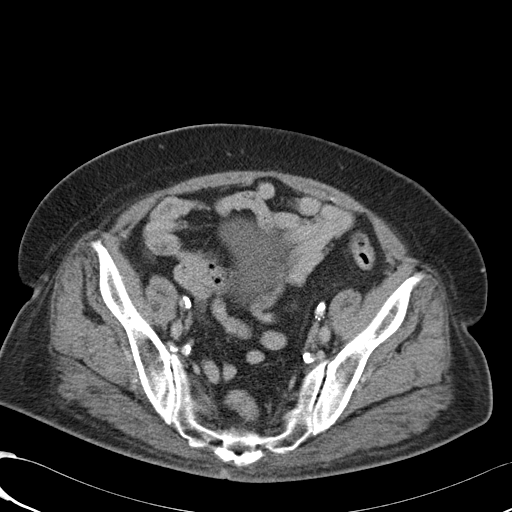
[im 29/85  soft-tissue]
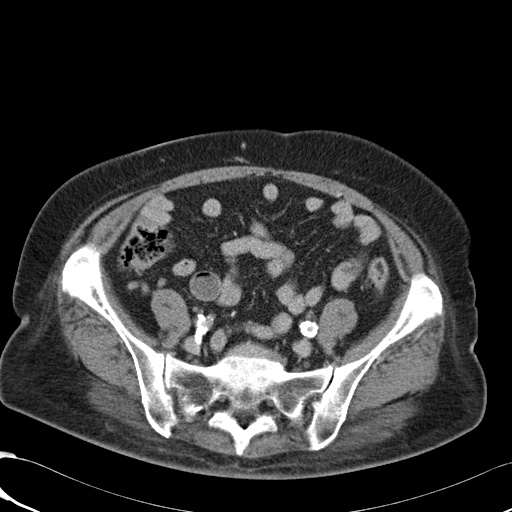
[im 38/85  soft-tissue]
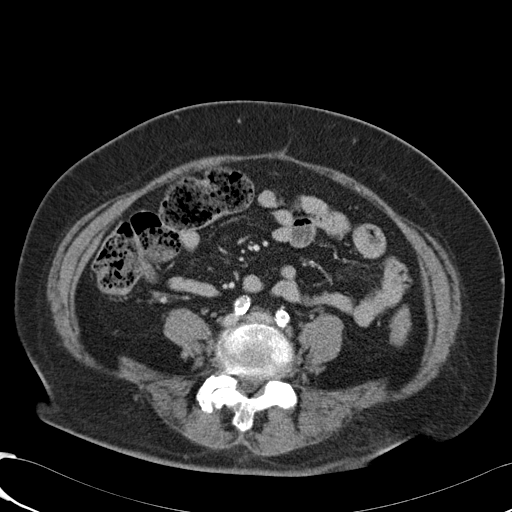
[im 43/85  soft-tissue]
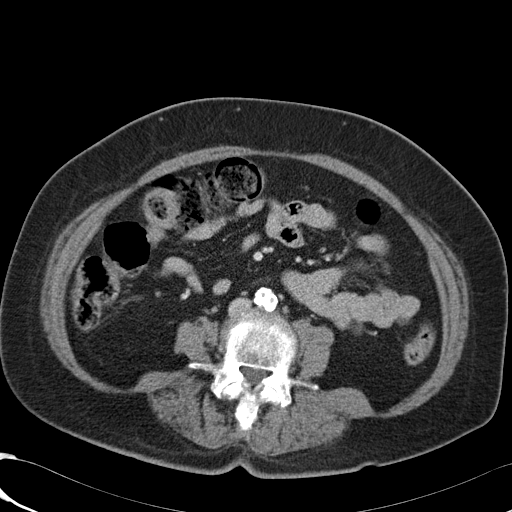
[im 47/85  soft-tissue]
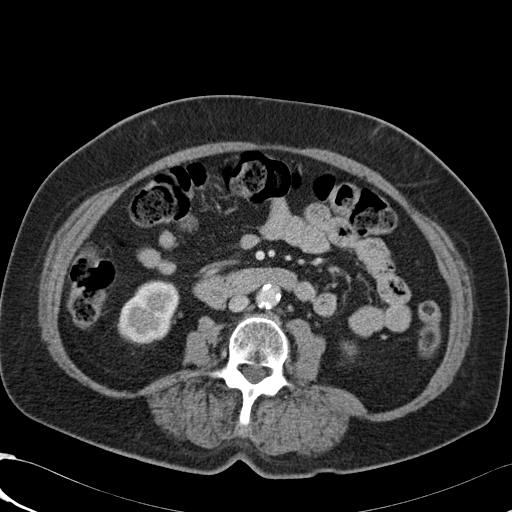
[im 57/85  soft-tissue]
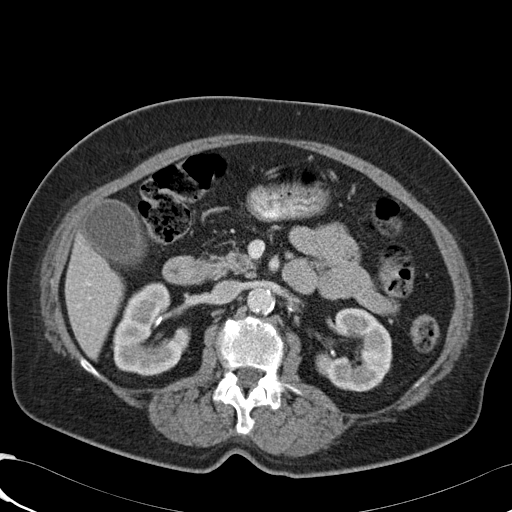
[im 57/85  bone]
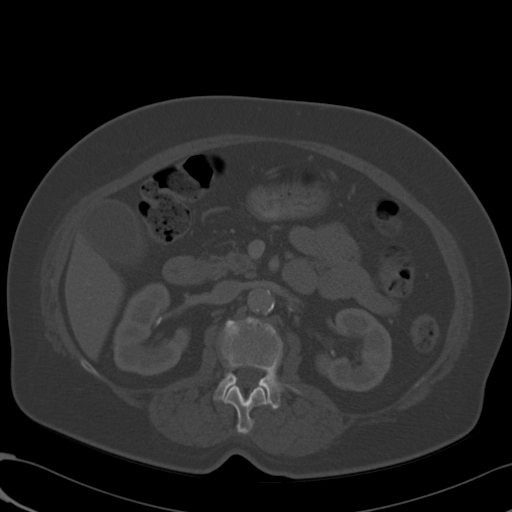
[im 61/85  soft-tissue]
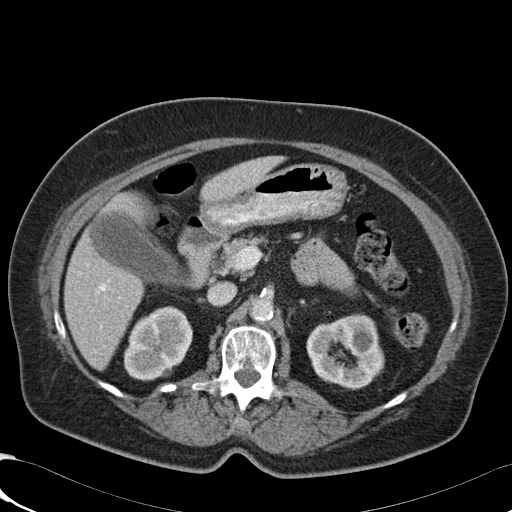
[im 66/85  soft-tissue]
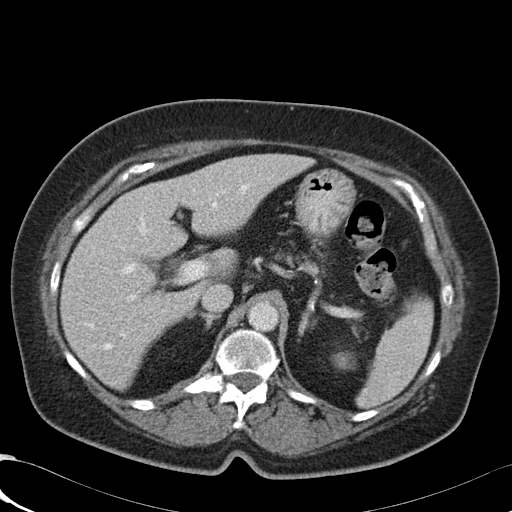
[im 75/85  soft-tissue]
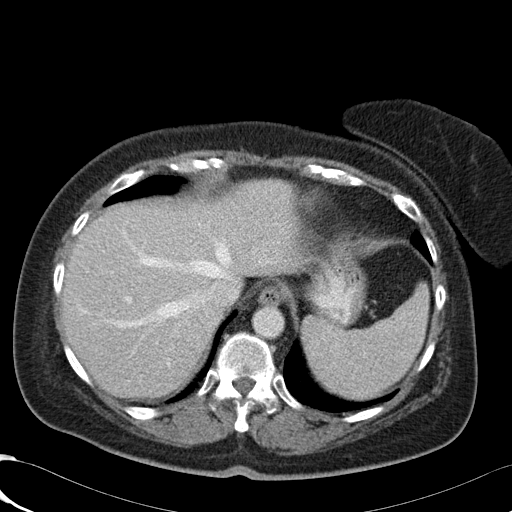
[im 80/85  soft-tissue]
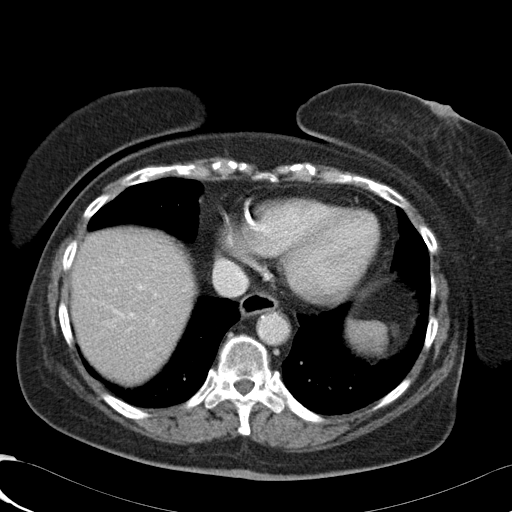

[Series 3: coronal · coronal · 0.72mm/px · 3 of 135 slices shown]
[im 45/135  soft-tissue]
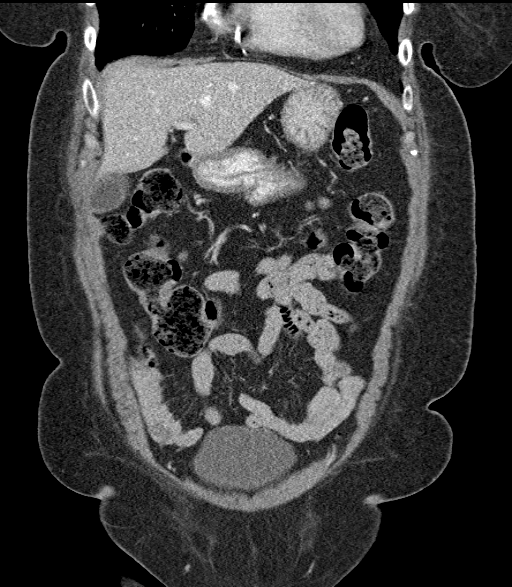
[im 60/135  soft-tissue]
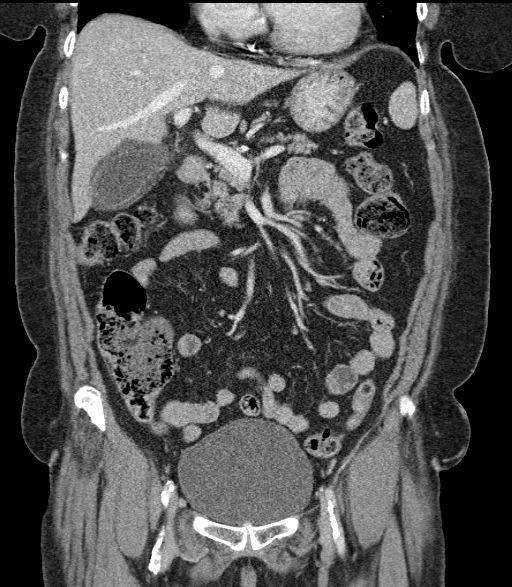
[im 75/135  soft-tissue]
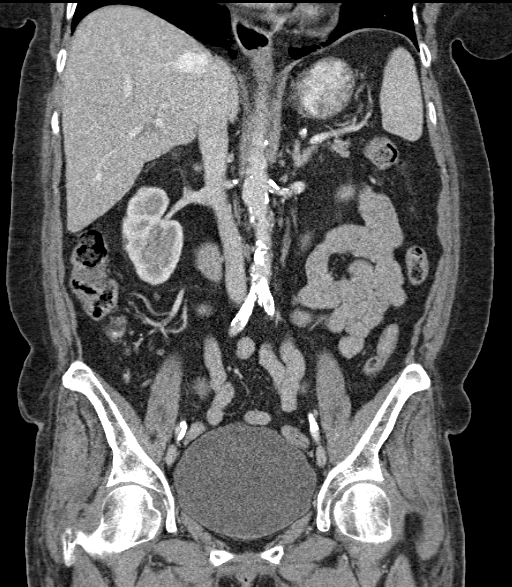

[16 of 46 positions shown; findings below may reference images not displayed]

FINDINGS: Atelectasis in the lung bases.

Cholelithiasis with small stone in the gallbladder. There is
evidence of pericholecystic edema. Changes suggest cholecystitis.
Small focal calcification in the head of the pancreas which was not
present previously. This may represent a common duct stone. No bile
duct dilatation. The liver, spleen, pancreas, adrenal glands,
kidneys, inferior vena cava, and retroperitoneal lymph nodes are
unremarkable. Calcification throughout the abdominal aorta and
branch vessels with bilateral iliac artery stents. Diffuse
calcification in the external iliac and common femoral arteries may
represent areas of significant stenosis. Stomach, small bowel, and
colon are not abnormally distended. No free air or free fluid in the
abdomen.

Pelvis: Appendix is normal. Bladder wall is not thickened. Uterus
and ovaries are not enlarged. No free or loculated pelvic fluid
collections. No pelvic mass or lymphadenopathy. Degenerative changes
in the spine.
IMPRESSION: Cholelithiasis with gallbladder wall thickening and edema possibly
representing acute cholecystitis. Calcification in the region of the
head of the pancreas may represent a common duct stone although
there is no significant bile duct dilatation. Extensive vascular
calcifications. No evidence bowel obstruction or inflammation.

## 2017-06-26 MED ORDER — FLUTICASONE PROPIONATE 50 MCG/ACT NA SUSP: 2.0000 | Freq: Every day | NASAL | 5 refills | Status: DC

## 2017-06-26 NOTE — Assessment & Plan Note (Signed)
Please continue Spiriva Respimat 2 sprays once daily. Keep albuterol available to use 2 puffs up to every 4 hours if needed for shortness of breath, chest tightness, wheezing. Follow with Dr Lamonte Sakai in 6 months or sooner if you have any problems

## 2017-06-26 NOTE — Progress Notes (Signed)
Subjective:    Patient ID: Rachel [name], female    DOB: 05-14-1950, 67 y.o.   MRN: [id number]  COPD  She complains of cough and shortness of breath. There is no wheezing. Associated symptoms include appetite change, sneezing and trouble swallowing. Pertinent negatives include no ear pain, fever, headaches, postnasal drip, rhinorrhea or sore throat. Her past medical history is significant for COPD.   67 year old smoker (50 pack years) with a history of hypertension, coronary artery disease, peripheral vascular disease, diabetes, allergic rhinitis.  She is referred today by Dr. Wolfgang Phoenix for evaluation of persistent cough, dyspnea and probable COPD.  She reports that she started to notice exertional SOB with previously easy tasks. She has had a worsening of a chronic cough and sinus drainage, hoarse voice. She has controlled GERD, has some occasional dysphagia. She has  A lot of nasal congestion.   Pulmonary function testing was done 03/29/17 and I have reviewed.  This shows moderate obstruction with out a bronchodilator response, normal lung volumes, severely decreased diffusion capacity that does not correct when adjusted for alveolar volume.  ROV 06/26/17 --this is a follow-up visit for patient with a history of tobacco use and COPD with moderate obstruction noted on pulmonary function testing.  At her last visit we talked about smoking cessation and also started her on Spiriva Respimat to see if she would benefit.  A walking oximetry did not show any desaturation at that visit. She believes her breathing is better. She still has a lot of nasal drainage and congestion. She has albuterol, uses it about . Smoking about 5 cigarettes a day. She has seen S Groce regarding LDCT, planning to start medicare in Sept and then scan in Oct.    Review of Systems  Constitutional: Positive for appetite change. Negative for fever and unexpected weight change.  HENT: Positive for congestion, sneezing, trouble  swallowing and voice change. Negative for dental problem, ear pain, nosebleeds, postnasal drip, rhinorrhea, sinus pressure and sore throat.   Eyes: Negative for redness and itching.  Respiratory: Positive for cough and shortness of breath. Negative for chest tightness and wheezing.   Cardiovascular: Negative for palpitations and leg swelling.  Gastrointestinal: Negative for nausea and vomiting.  Genitourinary: Negative for dysuria.  Musculoskeletal: Negative for joint swelling.  Skin: Negative for rash.  Neurological: Negative for headaches.  Hematological: Does not bruise/bleed easily.  Psychiatric/Behavioral: Negative for dysphoric mood. The patient is not nervous/anxious.    Past Medical History:  Diagnosis Date  . Asthma   . Coronary atherosclerosis of native coronary artery    DES x 2 (overlapping) RCA 03/2008 - patent 2011  . Diabetes type 2, controlled ([place]) 2000  . Essential hypertension, benign   . Fatty liver   . GERD (gastroesophageal reflux disease)   . Hyperlipidemia   . Microalbuminuria   . Obesity   . Odynophagia    Severe esophagitis on CT 05/2010, treated empirically with Diflucan  . PAD (peripheral artery disease) (HCC)    history of left greater than right lower 70 claudication  . Tubular adenoma of colon 07/2007   Multiple colonic polyps  . Venous stasis      Family History  Problem Relation Age of Onset  . Bone cancer Father   . Heart disease Maternal Grandmother   . Hypertension Mother   . Colon cancer Neg Hx   . Liver disease Neg Hx   . Inflammatory bowel disease Neg Hx      Social History  Socioeconomic History  . Marital status: Divorced    Spouse name: Not on file  . Number of children: 1  . Years of education: Not on file  . Highest education level: Not on file  Occupational History  . 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 Financial resource strain: Not on file  . Food insecurity:    Worry: Not on file      Inability: Not on file  . Transportation needs:    Medical: Not on file    Non-medical: Not on file  Tobacco Use  . Smoking status: Current Every Day Smoker    Packs/day: 0.25    Years: 50.00    Pack years: 12.50    Types: Cigarettes  . Smokeless tobacco: Never Used  Substance and Sexual Activity  . Alcohol use: No  . Drug use: No  . Sexual activity: Yes    Partners: Male    Birth control/protection: Condom    Comment: mutual friends  Lifestyle  . Physical activity:    Days per week: Not on file    Minutes per session: Not on file  . Stress: Not on file  Relationships  . Social connections:    Talks on phone: Not on file    Gets together: Not on file    Attends religious service: Not on file    Active member of club or organization: Not on file    Attends meetings of clubs or organizations: Not on file    Relationship status: Not on file  . Intimate partner violence:    Fear of current or ex partner: Not on file    Emotionally abused: Not on file    Physically abused: Not on file    Forced sexual activity: Not on file  Other Topics Concern  . Not on file  Social History Narrative  . Not on file     Allergies  Allergen Reactions  . Altace [Ramipril] Cough  . Biaxin [Clarithromycin]     Fatigue  . Niacin And Related Other (See Comments)    Flush - felt like on fire.  . Nsaids Other (See Comments)    feverish  . Adhesive [Tape] Rash    Paper tape is ok     Outpatient Medications Prior to Visit  Medication Sig Dispense Refill  . albuterol (PROAIR HFA) 108 (90 Base) MCG/ACT inhaler Inhale 2 puffs into the lungs every 4 (four) hours as needed for wheezing or shortness of breath. 1 Inhaler 5  . aspirin EC 81 MG tablet Take 81 mg by mouth daily.      . blood glucose meter kit and supplies KIT Dispense based on patient and insurance preference. Use up to four times daily as directed. (FOR ICD-10 E11.65) Bayer Contour 1 each 0  . clonazePAM (KLONOPIN) 0.5 MG  tablet One po Qhs prn sleep 30 tablet 5  . clopidogrel (PLAVIX) 75 MG tablet Take 1 tablet (75 mg total) by mouth daily. 90 tablet 3  . Continuous Blood Gluc Receiver (FREESTYLE LIBRE 14 DAY READER) DEVI 1 each by Does not apply route every 14 (fourteen) days. 1 Device 0  . Continuous Blood Gluc Sensor (FREESTYLE LIBRE 14 DAY SENSOR) MISC 1 each by Does not apply route every 14 (fourteen) days. 2 each 5  . Continuous Blood Gluc Sensor (FREESTYLE LIBRE SENSOR SYSTEM) MISC USE ONE SENSOR EVERY 10 DAYS. 3 each 2  . gabapentin (NEURONTIN) 400 MG capsule TAKE ONE  CAPSULE BY MOUTH AT BEDTIME AS DIRECTED  2  . glucose blood (BAYER CONTOUR NEXT TEST) test strip Use as directed 4 x daily. E11.65 150 each 5  . hydrochlorothiazide (MICROZIDE) 12.5 MG capsule TAKE 1 CAPSULE (12.5 MG TOTAL) BY MOUTH DAILY. 90 capsule 1  . insulin aspart (NOVOLOG FLEXPEN) 100 UNIT/ML FlexPen INJECT 14-20 UNITS 3 TIMES DAILY WITH MEALS INTO THE SKIN 45 mL 0  . Insulin Glargine (TOUJEO MAX SOLOSTAR) 300 UNIT/ML SOPN Inject 50 Units into the skin at bedtime. 2 pen 2  . Insulin Pen Needle (B-D ULTRAFINE III SHORT PEN) 31G X 8 MM MISC 1 each by Does not apply route as directed. 150 each 3  . Lancets 30G MISC Test 4 x daily. E11.65. Bayer contour next 150 each 5  . levothyroxine (SYNTHROID, LEVOTHROID) 100 MCG tablet TAKE 1 TABLET BY MOUTH EVERY DAY BEFORE BREAKFAST 90 tablet 0  . meclizine (ANTIVERT) 25 MG tablet Take 1 tablet (25 mg total) by mouth 3 (three) times daily as needed for dizziness. 30 tablet 2  . oxybutynin (DITROPAN-XL) 10 MG 24 hr tablet Take 2 tablets (20 mg total) by mouth daily. 180 tablet 1  . pantoprazole (PROTONIX) 40 MG tablet TAKE 1 TABLET BY MOUTH EVERY DAY 90 tablet 1  . potassium chloride SA (KLOR-CON M20) 20 MEQ tablet Take 1 tablet (20 mEq total) by mouth 2 (two) times daily. 60 tablet 5  . rosuvastatin (CRESTOR) 40 MG tablet Take 1 tablet (40 mg total) by mouth at bedtime. 90 tablet 1  . Tiotropium  Bromide Monohydrate (SPIRIVA RESPIMAT) 2.5 MCG/ACT AERS Inhale 2 puffs into the lungs daily. 1 Inhaler 5  . Vitamin D, Ergocalciferol, (DRISDOL) 50000 units CAPS capsule TAKE 1 CAPSULE (50,000 UNITS TOTAL) BY MOUTH EVERY 7 (SEVEN) DAYS. 12 capsule 0   No facility-administered medications prior to visit.         Objective:   Physical Exam Vitals:   06/26/17 1102  BP: 124/80  Pulse: (!) 103  SpO2: 97%  Weight: 153 lb (69.4 kg)  Height: 5' 3"  (1.6 m)    Gen: Pleasant, well-nourished, in no distress,  normal affect  ENT: No lesions,  mouth clear,  oropharynx clear, no postnasal drip, hoarse voice but improved  Neck: No JVD, no stridor  Lungs: No use of accessory muscles,clear without rales or rhonchi  Cardiovascular: RRR, heart sounds normal, no murmur or gallops, no peripheral edema  Musculoskeletal: No deformities, no cyanosis or clubbing  Neuro: alert, non focal  Skin: Warm, no lesions or rashes      Assessment & Plan:  COPD (chronic obstructive pulmonary disease) (HCC) Please continue Spiriva Respimat 2 sprays once daily. Keep albuterol available to use 2 puffs up to every 4 hours if needed for shortness of breath, chest tightness, wheezing. Follow with Dr Lamonte Sakai in 6 months or sooner if you have any problems  TOBACCO ABUSE Congratulations on decreasing your smoking.  We set a goal of getting down to 3 cigarettes daily.  From that point our next step will be to try to set a quit date. Get your low-dose CT scan screening done in October as planned.  Allergic rhinitis Stop loratadine (Claritin) Try starting Zyrtec 10 mg once daily Start fluticasone nasal spray, 2 sprays each nostril once daily.  Baltazar Apo, MD, PhD 06/26/2017, 11:30 AM [facility] [phone] or if no answer [phone]

## 2017-06-26 NOTE — Assessment & Plan Note (Signed)
Stop loratadine (Claritin) Try starting Zyrtec 10 mg once daily Start fluticasone nasal spray, 2 sprays each nostril once daily.

## 2017-06-26 NOTE — Patient Instructions (Signed)
Please continue Spiriva Respimat 2 sprays once daily. Keep albuterol available to use 2 puffs up to every 4 hours if needed for shortness of breath, chest tightness, wheezing. Stop loratadine (Claritin) Try starting Zyrtec 10 mg once daily Start fluticasone nasal spray, 2 sprays each nostril once daily. Congratulations on decreasing your smoking.  We set a goal of getting down to 3 cigarettes daily.  From that point our next step will be to try to set a quit date. Get your low-dose CT scan screening done in October as planned. Follow with Dr Lamonte Sakai in 6 months or sooner if you have any problems

## 2017-06-26 NOTE — Assessment & Plan Note (Signed)
Congratulations on decreasing your smoking.  We set a goal of getting down to 3 cigarettes daily.  From that point our next step will be to try to set a quit date. Get your low-dose CT scan screening done in October as planned.

## 2017-07-03 ENCOUNTER — Ambulatory Visit (HOSPITAL_COMMUNITY): Admission: RE | Admit: 2017-07-03 | Payer: BLUE CROSS/BLUE SHIELD | Source: Ambulatory Visit

## 2017-07-09 ENCOUNTER — Other Ambulatory Visit: Payer: Self-pay | Admitting: "Endocrinology

## 2017-07-10 ENCOUNTER — Other Ambulatory Visit: Payer: Self-pay | Admitting: Cardiovascular Disease

## 2017-07-11 ENCOUNTER — Telehealth: Payer: Self-pay | Admitting: Emergency Medicine

## 2017-07-11 MED ORDER — TRAMADOL HCL 50 MG PO TABS: 50.0000 mg | ORAL_TABLET | Freq: Four times a day (QID) | ORAL | 0 refills | Status: DC | PRN

## 2017-07-11 NOTE — Telephone Encounter (Signed)
We can order short term tramadol - this might heklp her pain and suppress her cough.   Tramadol 50mg , take 1-2 tab q6 hours if needed for pain or cough. Disp #30. 0 RF

## 2017-07-11 NOTE — Telephone Encounter (Signed)
Pt aware of recs.  rx sent to preferred pharmacy.  Nothing further needed.  

## 2017-07-11 NOTE — Telephone Encounter (Addendum)
Spoke with pt, she states she is has coughing spells and its causing her pain in her chest and back. She started coughing on last Thursday and the pain started on Monday. She is not coughing up anything and doesn't feel like she has an infection but would like something for pain. She has tried Ibuprofen and Tylenol but no relief. She has used her albuterol inhaler but no relief and states she doesn't have any heart problems as of now. She had stents placed previously but nothing since then. RB please advise.   [facility]  Current Outpatient Medications on File Prior to Visit  Medication Sig Dispense Refill  . albuterol (PROAIR HFA) 108 (90 Base) MCG/ACT inhaler Inhale 2 puffs into the lungs every 4 (four) hours as needed for wheezing or shortness of breath. 1 Inhaler 5  . aspirin EC 81 MG tablet Take 81 mg by mouth daily.      . blood glucose meter kit and supplies KIT Dispense based on patient and insurance preference. Use up to four times daily as directed. (FOR ICD-10 E11.65) Bayer Contour 1 each 0  . clonazePAM (KLONOPIN) 0.5 MG tablet One po Qhs prn sleep 30 tablet 5  . clopidogrel (PLAVIX) 75 MG tablet Take 1 tablet (75 mg total) by mouth daily. 90 tablet 3  . Continuous Blood Gluc Receiver (FREESTYLE LIBRE 14 DAY READER) DEVI 1 each by Does not apply route every 14 (fourteen) days. 1 Device 0  . Continuous Blood Gluc Sensor (FREESTYLE LIBRE 14 DAY SENSOR) MISC 1 each by Does not apply route every 14 (fourteen) days. 2 each 5  . Continuous Blood Gluc Sensor (FREESTYLE LIBRE SENSOR SYSTEM) MISC USE ONE SENSOR EVERY 10 DAYS. 3 each 2  . fluticasone (FLONASE) 50 MCG/ACT nasal spray Place 2 sprays into both nostrils daily. 16 g 5  . gabapentin (NEURONTIN) 400 MG capsule TAKE ONE CAPSULE BY MOUTH AT BEDTIME AS DIRECTED  2  . glucose blood (BAYER CONTOUR NEXT TEST) test strip Use as directed 4 x daily. E11.65 150 each 5  . hydrochlorothiazide (MICROZIDE) 12.5 MG capsule TAKE 1 CAPSULE (12.5 MG  TOTAL) BY MOUTH DAILY. 90 capsule 1  . Insulin Glargine (TOUJEO MAX SOLOSTAR) 300 UNIT/ML SOPN Inject 50 Units into the skin at bedtime. 2 pen 2  . Insulin Pen Needle (B-D ULTRAFINE III SHORT PEN) 31G X 8 MM MISC 1 each by Does not apply route as directed. 150 each 3  . Lancets 30G MISC Test 4 x daily. E11.65. Bayer contour next 150 each 5  . levothyroxine (SYNTHROID, LEVOTHROID) 100 MCG tablet TAKE 1 TABLET BY MOUTH EVERY DAY BEFORE BREAKFAST 90 tablet 0  . meclizine (ANTIVERT) 25 MG tablet Take 1 tablet (25 mg total) by mouth 3 (three) times daily as needed for dizziness. 30 tablet 2  . NOVOLOG FLEXPEN 100 UNIT/ML FlexPen INJECT 14-20 UNITS 3 TIMES DAILY WITH MEALS INTO THE SKIN 45 mL 0  . oxybutynin (DITROPAN-XL) 10 MG 24 hr tablet Take 2 tablets (20 mg total) by mouth daily. 180 tablet 1  . pantoprazole (PROTONIX) 40 MG tablet TAKE 1 TABLET BY MOUTH EVERY DAY 90 tablet 1  . potassium chloride SA (KLOR-CON M20) 20 MEQ tablet Take 1 tablet (20 mEq total) by mouth 2 (two) times daily. 60 tablet 5  . rosuvastatin (CRESTOR) 40 MG tablet TAKE 1 TABLET EVERY DAY AT BEDTIME 90 tablet 3  . Tiotropium Bromide Monohydrate (SPIRIVA RESPIMAT) 2.5 MCG/ACT AERS Inhale 2 puffs into the lungs daily.  1 Inhaler 5  . Vitamin D, Ergocalciferol, (DRISDOL) 50000 units CAPS capsule TAKE 1 CAPSULE (50,000 UNITS TOTAL) BY MOUTH EVERY 7 (SEVEN) DAYS. 12 capsule 0   No current facility-administered medications on file prior to visit.    Allergies  Allergen Reactions  . Altace [Ramipril] Cough  . Biaxin [Clarithromycin]     Fatigue  . Niacin And Related Other (See Comments)    Flush - felt like on fire.  . Nsaids Other (See Comments)    feverish  . Adhesive [Tape] Rash    Paper tape is ok

## 2017-07-12 ENCOUNTER — Encounter (HOSPITAL_COMMUNITY): Payer: BLUE CROSS/BLUE SHIELD

## 2017-07-26 ENCOUNTER — Encounter: Payer: Self-pay | Admitting: Family Medicine

## 2017-07-26 ENCOUNTER — Ambulatory Visit: Payer: BLUE CROSS/BLUE SHIELD | Admitting: Family Medicine

## 2017-07-26 VITALS — BP 132/70 | Ht 63.0 in | Wt 155.0 lb

## 2017-07-26 DIAGNOSIS — E785 Hyperlipidemia, unspecified: Secondary | ICD-10-CM | POA: Diagnosis not present

## 2017-07-26 DIAGNOSIS — J449 Chronic obstructive pulmonary disease, unspecified: Secondary | ICD-10-CM

## 2017-07-26 DIAGNOSIS — I739 Peripheral vascular disease, unspecified: Secondary | ICD-10-CM

## 2017-07-26 DIAGNOSIS — I10 Essential (primary) hypertension: Secondary | ICD-10-CM | POA: Diagnosis not present

## 2017-07-26 MED ORDER — PANTOPRAZOLE SODIUM 40 MG PO TBEC: 40.0000 mg | DELAYED_RELEASE_TABLET | Freq: Every day | ORAL | 1 refills | Status: DC

## 2017-07-26 MED ORDER — HYDROCHLOROTHIAZIDE 12.5 MG PO CAPS: 12.5000 mg | ORAL_CAPSULE | Freq: Every day | ORAL | 1 refills | Status: DC

## 2017-07-26 MED ORDER — GABAPENTIN 400 MG PO CAPS: ORAL_CAPSULE | ORAL | 3 refills | Status: DC

## 2017-07-26 MED ORDER — OXYBUTYNIN CHLORIDE 5 MG PO TABS: 5.0000 mg | ORAL_TABLET | Freq: Two times a day (BID) | ORAL | 1 refills | Status: DC

## 2017-07-26 NOTE — Progress Notes (Signed)
   Subjective:    Patient ID: Rachel [name], female    DOB: Apr 06, 1950, 67 y.o.   MRN: [id number]  Hyperlipidemia  This is a chronic problem. The current episode started more than 1 year ago. Compliance problems include adherence to exercise.    Pt has concerns about the cost of some of her meds. She will be going on medicare soon.  Blood pressure medicine and blood pressure levels reviewed today with patient. Compliant with blood pressure medicine. States does not miss a dose. No obvious side effects. Blood pressure generally good when checked elsewhere. Watching salt intake.   Patient continues to take lipid medication regularly. No obvious side effects from it. Generally does not miss a dose. Prior blood work results are reviewed with patient. Patient continues to work on fat intake in diet  Known copd, trying to walk ome   Patient on Ditropan XL for urinary incontinence.  Would like to have the generic formulation.  Discussed  Unfortunately still smoking despite her many diseases including COPD coronary artery disease carotid artery disease peripheral arterial disease.  Review of Systems No headache, no major weight loss or weight gain, no chest pain no back pain abdominal pain no change in bowel habits complete ROS otherwise negative     Objective:   Physical Exam  Alert and oriented, vitals reviewed and stable, NAD ENT-TM's and ext canals WNL bilat via otoscopic exam Soft palate, tonsils and post pharynx WNL via oropharyngeal exam Neck-symmetric, no masses; thyroid nonpalpable and nontender Pulmonary-no tachypnea or accessory muscle use; Clear without wheezes via auscultation Card--no abnrml murmurs, rhythm reg and rate WNL Carotid pulses symmetric, without bruits       Assessment & Plan:  COPD.  Smoking cessation1 once again encouraged and discussed.  Medications reviewed  2.  Urge incontinence.  Patient to maintain triptan but in generic form 5 mg twice daily will  make a change discussed  3.  Hypertension.  Controlled good maintain same therapy  4.  Hyperlipidemia prior blood work shows decent control  5.  Coronary artery disease  Meds suggested where possible, diet discussed exercise discussed smoking cessation discussed medications refilled recheck in 6 months

## 2017-07-31 ENCOUNTER — Telehealth: Payer: Self-pay | Admitting: Family Medicine

## 2017-07-31 NOTE — Telephone Encounter (Signed)
Pt was on oxybutinin XL daily and costly, this was changed to generic plain oxybutinin twice per day, pt advidsed of this in office  Ref neurontin as rec did it possibly go to another pharm?

## 2017-07-31 NOTE — Telephone Encounter (Signed)
Pt states [facility] did not receive the gabapentin (NEURONTIN) 400 MG capsule that we ordered on 07/26/17  Also states that [facility] [place] refilled the Oxybutynin but patient states she was told to stop this medicine (she did not pick this up) & we were changing to something else - pt unsure of name of new medicine  Please advise & call pt when done

## 2017-07-31 NOTE — Telephone Encounter (Signed)
This is Dr. Steve's patient 

## 2017-07-31 NOTE — Telephone Encounter (Signed)
Patient notified that both prescriptions were sent to pharmacy 07/26/17 and confirmed at 8:49am. Patient stated she will check with the pharmacy and call us back if any problems.

## 2017-08-04 ENCOUNTER — Other Ambulatory Visit: Payer: Self-pay

## 2017-08-04 ENCOUNTER — Encounter (HOSPITAL_COMMUNITY): Payer: Self-pay | Admitting: Emergency Medicine

## 2017-08-04 ENCOUNTER — Emergency Department (HOSPITAL_COMMUNITY)
Admission: EM | Admit: 2017-08-04 | Discharge: 2017-08-04 | Disposition: A | Discharge status: Home / Self Care | Payer: BLUE CROSS/BLUE SHIELD | Attending: Emergency Medicine | Admitting: Emergency Medicine

## 2017-08-04 DIAGNOSIS — Z8679 Personal history of other diseases of the circulatory system: Secondary | ICD-10-CM

## 2017-08-04 DIAGNOSIS — J449 Chronic obstructive pulmonary disease, unspecified: Secondary | ICD-10-CM | POA: Insufficient documentation

## 2017-08-04 DIAGNOSIS — Z794 Long term (current) use of insulin: Secondary | ICD-10-CM | POA: Diagnosis not present

## 2017-08-04 DIAGNOSIS — I2510 Atherosclerotic heart disease of native coronary artery without angina pectoris: Secondary | ICD-10-CM | POA: Insufficient documentation

## 2017-08-04 DIAGNOSIS — Z7982 Long term (current) use of aspirin: Secondary | ICD-10-CM | POA: Diagnosis not present

## 2017-08-04 DIAGNOSIS — M25571 Pain in right ankle and joints of right foot: Secondary | ICD-10-CM

## 2017-08-04 DIAGNOSIS — E119 Type 2 diabetes mellitus without complications: Secondary | ICD-10-CM | POA: Diagnosis not present

## 2017-08-04 DIAGNOSIS — F17210 Nicotine dependence, cigarettes, uncomplicated: Secondary | ICD-10-CM | POA: Insufficient documentation

## 2017-08-04 DIAGNOSIS — Z79899 Other long term (current) drug therapy: Secondary | ICD-10-CM | POA: Insufficient documentation

## 2017-08-04 DIAGNOSIS — I10 Essential (primary) hypertension: Secondary | ICD-10-CM | POA: Insufficient documentation

## 2017-08-04 DIAGNOSIS — E782 Mixed hyperlipidemia: Secondary | ICD-10-CM | POA: Insufficient documentation

## 2017-08-04 DIAGNOSIS — I739 Peripheral vascular disease, unspecified: Secondary | ICD-10-CM | POA: Insufficient documentation

## 2017-08-04 MED ORDER — TRAMADOL HCL 50 MG PO TABS: ORAL_TABLET | ORAL | 0 refills | Status: DC

## 2017-08-04 MED ORDER — TRAMADOL HCL 50 MG PO TABS
100.0000 mg | ORAL_TABLET | Freq: Once | ORAL | Status: AC
  Administered 2017-08-04: 100 mg via ORAL
  Filled 2017-08-04: qty 2

## 2017-08-04 MED ORDER — ONDANSETRON HCL 4 MG PO TABS
4.0000 mg | ORAL_TABLET | Freq: Once | ORAL | Status: AC
  Administered 2017-08-04: 4 mg via ORAL
  Filled 2017-08-04: qty 1

## 2017-08-04 NOTE — ED Triage Notes (Signed)
Pt reports she has clogged arteries in both legs, last night began having R leg pain that would not go away. Per her vascular MD come to ED. Pulse present, cap refill <3 seconds, sensation intact, extremity warm.

## 2017-08-04 NOTE — ED Provider Notes (Signed)
[facility] EMERGENCY DEPARTMENT Provider Note   CSN: [id number] Arrival date & time: 08/04/17  1043     History   Chief Complaint Chief Complaint  Patient presents with  . Leg Pain    HPI Rachel [name] is a 67 y.o. female.  Patient is a 67 year old female who presents to the emergency department with a complaint of leg pain.  The patient states that she has "clogged arteries in both legs".  She began to have a pain in her left lower leg toward the ankle that would not seem to go away.  She became very concerned, called her vascular MD, and was told to come to the emergency department for evaluation.  The patient states she has feeling in the leg, she can walk on it, but it hurts.  She has not had any fever or chills.  She says the leg is not cold.  No recent trauma to the extremity and no recent procedures to the extremity.  She presents to the emergency department at this time for additional evaluation.     Past Medical History:  Diagnosis Date  . Asthma   . Coronary atherosclerosis of native coronary artery    DES x 2 (overlapping) RCA 03/2008 - patent 2011  . Diabetes type 2, controlled ([place]) 2000  . Essential hypertension, benign   . Fatty liver   . GERD (gastroesophageal reflux disease)   . Hyperlipidemia   . Microalbuminuria   . Obesity   . Odynophagia    Severe esophagitis on CT 05/2010, treated empirically with Diflucan  . PAD (peripheral artery disease) ([facility])    history of left greater than right lower 70 claudication  . Tubular adenoma of colon 07/2007   Multiple colonic polyps  . Venous stasis     Patient Active Problem List   Diagnosis Date Noted  . Allergic rhinitis 06/26/2017  . Carotid artery disease ([place]) 06/20/2017  . COPD (chronic obstructive pulmonary disease) ([place]) 05/10/2017  . Vitamin D deficiency 02/15/2017  . Personal history of noncompliance with medical treatment, presenting hazards to health 03/30/2016  . Acute cholecystitis  07/23/2015  . Pseudoaneurysm of left femoral artery ([place]) 10/17/2014  . Claudication ([place]) 10/15/2014  . Hypokalemia 08/28/2014  . AKI (acute kidney injury) ([place]) 08/28/2014  . Diarrhea 08/28/2014  . High anion gap metabolic acidosis 40/98/1191  . Hypothyroidism 02/07/2014  . Essential hypertension, benign 10/13/2013  . Peripheral arterial disease ([place]) 05/22/2013  . Meniere's disease 04/07/2013  . DM type 2 causing vascular disease ([place]) 03/11/2009  . Mixed hyperlipidemia 03/11/2009  . TOBACCO ABUSE 03/11/2009  . Coronary atherosclerosis of native coronary artery 03/11/2009    Past Surgical History:  Procedure Laterality Date  . CARDIAC CATHETERIZATION     stents  . CHOLECYSTECTOMY N/A 07/30/2015   Procedure: LAPAROSCOPIC CHOLECYSTECTOMY;  Surgeon: Aviva Signs, MD;  Location: [facility];  Service: General;  Laterality: N/A;  . COLONOSCOPY W/ POLYPECTOMY  07/2007   Tubular adenoma  . CORONARY STENT PLACEMENT    . CYSTECTOMY     Pilonidal  . ESOPHAGOGASTRODUODENOSCOPY  04/2010   RMR: Normal esophagus , Stomach D1 and D2   . LOWER EXTREMITY ANGIOGRAPHY N/A 03/13/2016   Procedure: Lower Extremity Angiography;  Surgeon: [name], MD;  Location: [place] [facility];  Service: Cardiovascular;  Laterality: N/A;  . PERIPHERAL VASCULAR ATHERECTOMY  03/27/2016   Procedure: Peripheral Vascular Atherectomy;  Surgeon: [name], MD;  Location: [place] [facility];  Service: Cardiovascular;;  Attempted  atherectomy  . PERIPHERAL VASCULAR CATHETERIZATION Bilateral 09/28/2014   Procedure: Lower Extremity Angiography;  Surgeon: [name], MD;  Location: [place] [facility];  Service: Cardiovascular;  Laterality: Bilateral;  . PERIPHERAL VASCULAR CATHETERIZATION N/A 09/28/2014   Procedure: Abdominal Aortogram;  Surgeon: [name], MD;  Location: [place] [facility];  Service: Cardiovascular;  Laterality: N/A;  . PERIPHERAL VASCULAR CATHETERIZATION N/A 10/15/2014   Procedure: Lower  Extremity Angiography;  Surgeon: [name], MD;  Location: [place] [facility];  Service: Cardiovascular;  Laterality: N/A;  . PERIPHERAL VASCULAR CATHETERIZATION  10/15/2014   Procedure: Peripheral Vascular Intervention;  Surgeon: [name], MD;  Location: [place] [facility];  Service: Cardiovascular;;  bilateral iliac stents  . PILONIDAL CYST / SINUS EXCISION    . TUBAL LIGATION Bilateral      OB History   None      Home Medications    Prior to Admission medications   Medication Sig Start Date End Date Taking? Authorizing Provider  albuterol (PROAIR HFA) 108 (90 Base) MCG/ACT inhaler Inhale 2 puffs into the lungs every 4 (four) hours as needed for wheezing or shortness of breath. 05/10/17  Yes Collene Gobble, MD  aspirin EC 81 MG tablet Take 81 mg by mouth daily.     Yes [provider]  clopidogrel (PLAVIX) 75 MG tablet Take 1 tablet (75 mg total) by mouth daily. 01/05/17  Yes [name], MD  gabapentin (NEURONTIN) 400 MG capsule TAKE ONE CAPSULE BY MOUTH AT BEDTIME AS DIRECTED 07/26/17  Yes Mikey Kirschner, MD  hydrochlorothiazide (MICROZIDE) 12.5 MG capsule Take 1 capsule (12.5 mg total) by mouth daily. 07/26/17  Yes Mikey Kirschner, MD  insulin glargine (LANTUS) 100 UNIT/ML injection Inject 40 Units into the skin at bedtime.   Yes [provider]  Insulin Pen Needle (B-D ULTRAFINE III SHORT PEN) 31G X 8 MM MISC 1 each by Does not apply route as directed. 04/06/16  Yes Nida, Marella Chimes, MD  levothyroxine (SYNTHROID, LEVOTHROID) 100 MCG tablet TAKE 1 TABLET BY MOUTH EVERY DAY BEFORE BREAKFAST 06/21/17  Yes Nida, Marella Chimes, MD  meclizine (ANTIVERT) 25 MG tablet Take 1 tablet (25 mg total) by mouth 3 (three) times daily as needed for dizziness. 05/23/17  Yes Mikey Kirschner, MD  NOVOLOG FLEXPEN 100 UNIT/ML FlexPen INJECT 14-20 UNITS 3 TIMES DAILY WITH MEALS INTO THE SKIN 07/10/17  Yes Nida, Marella Chimes, MD  pantoprazole (PROTONIX) 40 MG  tablet Take 1 tablet (40 mg total) by mouth daily. 07/26/17  Yes Mikey Kirschner, MD  rosuvastatin (CRESTOR) 40 MG tablet TAKE 1 TABLET EVERY DAY AT BEDTIME 07/10/17  Yes [name], MD  Tiotropium Bromide Monohydrate (SPIRIVA RESPIMAT) 2.5 MCG/ACT AERS Inhale 2 puffs into the lungs daily. 06/14/17  Yes Collene Gobble, MD  traMADol (ULTRAM) 50 MG tablet Take 1-2 tablets (50-100 mg total) by mouth every 6 (six) hours as needed. 07/11/17  Yes Byrum, Rose Fillers, MD  Vitamin D, Ergocalciferol, (DRISDOL) 50000 units CAPS capsule TAKE 1 CAPSULE (50,000 UNITS TOTAL) BY MOUTH EVERY 7 (SEVEN) DAYS. 05/28/17  Yes Nida, Marella Chimes, MD  Continuous Blood Gluc Receiver (FREESTYLE LIBRE 14 DAY READER) DEVI 1 each by Does not apply route every 14 (fourteen) days. 05/21/17   Cassandria Anger, MD  Continuous Blood Gluc Sensor (FREESTYLE LIBRE 14 DAY SENSOR) MISC 1 each by Does not apply route every 14 (fourteen) days. 05/21/17   Cassandria Anger,  MD  Continuous Blood Gluc Sensor (FREESTYLE LIBRE SENSOR SYSTEM) MISC USE ONE SENSOR EVERY 10 DAYS. 04/23/17   Cassandria Anger, MD  fluticasone (FLONASE) 50 MCG/ACT nasal spray Place 2 sprays into both nostrils daily. Patient not taking: Reported on 07/26/2017 06/26/17   Collene Gobble, MD  glucose blood (BAYER CONTOUR NEXT TEST) test strip Use as directed 4 x daily. E11.65 Patient not taking: Reported on 07/26/2017 06/14/16   Cassandria Anger, MD  Insulin Glargine (TOUJEO MAX SOLOSTAR) 300 UNIT/ML SOPN Inject 50 Units into the skin at bedtime. Patient not taking: Reported on 08/04/2017 05/17/17   Cassandria Anger, MD  Lancets 30G MISC Test 4 x daily. E11.65. Bayer contour next Patient not taking: Reported on 07/26/2017 06/14/16   Cassandria Anger, MD  oxybutynin (DITROPAN) 5 MG tablet Take 1 tablet (5 mg total) by mouth 2 (two) times daily. Patient not taking: Reported on 08/04/2017 07/26/17   Mikey Kirschner, MD    Family History Family History    Problem Relation Age of Onset  . Bone cancer Father   . Heart disease Maternal Grandmother   . Hypertension Mother   . Colon cancer Neg Hx   . Liver disease Neg Hx   . Inflammatory bowel disease Neg Hx     Social History Social History   Tobacco Use  . Smoking status: Current Every Day Smoker    Packs/day: 0.25    Years: 50.00    Pack years: 12.50    Types: Cigarettes  . Smokeless tobacco: Never Used  Substance Use Topics  . Alcohol use: No  . Drug use: No     Allergies   Altace [ramipril]; Biaxin [clarithromycin]; Niacin and related; Nsaids; and Adhesive [tape]   Review of Systems Review of Systems  Constitutional: Negative for activity change.       All ROS Neg except as noted in HPI  HENT: Negative for nosebleeds.   Eyes: Negative for photophobia and discharge.  Respiratory: Negative for cough, shortness of breath and wheezing.   Cardiovascular: Negative for chest pain and palpitations.  Gastrointestinal: Negative for abdominal pain and blood in stool.  Genitourinary: Negative for dysuria, frequency and hematuria.  Musculoskeletal: Positive for arthralgias. Negative for back pain and neck pain.  Skin: Negative.   Neurological: Negative for dizziness, seizures and speech difficulty.  Psychiatric/Behavioral: Negative for confusion and hallucinations. The patient is nervous/anxious.      Physical Exam Updated Vital Signs BP (!) 149/89 (BP Location: Right Arm)   Pulse 100   Temp 98.6 F (37 C) (Oral)   Resp 14   Ht 5\' 3"  (1.6 m)   Wt 68 kg (150 lb)   SpO2 100%   BMI 26.57 kg/m   Physical Exam  Constitutional: She is oriented to person, place, and time. She appears well-developed and well-nourished.  Non-toxic appearance.  HENT:  Head: Normocephalic.  Right Ear: Tympanic membrane and external ear normal.  Left Ear: Tympanic membrane and external ear normal.  Eyes: Pupils are equal, round, and reactive to light. EOM and lids are normal.  Neck: Normal  range of motion. Neck supple. Carotid bruit is not present.  Cardiovascular: Normal rate, regular rhythm, normal heart sounds, intact distal pulses and normal pulses.  Pulmonary/Chest: No respiratory distress. She has rhonchi.  Abdominal: Soft. Bowel sounds are normal. There is no tenderness. There is no guarding.  Musculoskeletal: Normal range of motion.  There is full range of motion of the right and left hip,  knee, ankle, and toes.  There are no noted swelling behind the knees.  The knees are not hot.  There is no deformity of the tibial area.  There is no significant pain of the calfs, and there is no swelling bilaterally of the calf area.  The Achilles tendon is intact bilaterally.  The dorsalis pedis pulses 2+.  Capillary refill is less than 3 seconds bilaterally.  There are no lesions noted between the toes.  There are no puncture wounds noted of the plantar surface.  The patient has full range of motion of the right and left ankle.  There does not appear to be any more pain of the right ankle at this time than on the left, and there is no swelling.   Lymphadenopathy:       Head (right side): No submandibular adenopathy present.       Head (left side): No submandibular adenopathy present.    She has no cervical adenopathy.  Neurological: She is alert and oriented to person, place, and time. She has normal strength. No cranial nerve deficit or sensory deficit.  Skin: Skin is warm and dry.  Psychiatric: She has a normal mood and affect. Her speech is normal.  Nursing note and vitals reviewed.    ED Treatments / Results  Labs (all labs ordered are listed, but only abnormal results are displayed) Labs Reviewed - No data to display  EKG None  Radiology No results found.  Procedures Procedures (including critical care time)  Medications Ordered in ED Medications - No data to display   Initial Impression / Assessment and Plan / ED Course  I have reviewed the triage vital signs and  the nursing notes.  Pertinent labs & imaging results that were available during my care of the patient were reviewed by me and considered in my medical decision making (see chart for details).       Final Clinical Impressions(s) / ED Diagnoses MDM  Vital signs within normal limits.  Pulse oximetry is 100% on room air.  Within normal limits by my interpretation.  There are no temperature changes or color changes between the right and left lower extremity.  The dorsalis pedis pulses palpated.  The patient has capillary refill of less than 3 seconds.  There is no increased redness that would indicate signs of infection.  There is no evidence of trauma.  The pain seems to mostly be in the ankle area, and the pain can be reproduced with manipulation of the ankle.  I suspect that the patient has an injury to the lower ankle area.  There is no acute arterial emergency at this time.    Final diagnoses:  Acute right ankle pain  History of peripheral vascular disease    ED Discharge Orders    None       Lily Kocher, PA-C 08/05/17 [id number]    Elnora Morrison, MD 08/07/17 1120

## 2017-08-04 NOTE — Discharge Instructions (Signed)
Your vital signs are within normal limits.  Your oxygen level is 100% on room air.  There are no changes in the temperature of your lower extremities.  There are no differences in the pulses at the top of your foot.  The color at the tip of your toes is equal.  There is no swelling of your calf area.  There is no mass or swelling behind your knees.  The ankle pain may be related to many different issues, but does not seem to be related to your vascular issues.  Please keep your legs elevated above your waist when he is sitting and above your heart when lying down.  Use Tylenol extra strength for pain and discomfort.  May use Ultram every 6 hours if needed for severe pain.  Please discuss this with Dr. Wolfgang Phoenix for additional pain management.

## 2017-08-16 ENCOUNTER — Ambulatory Visit: Payer: BLUE CROSS/BLUE SHIELD | Admitting: "Endocrinology

## 2017-08-24 ENCOUNTER — Other Ambulatory Visit: Payer: Self-pay

## 2017-08-24 ENCOUNTER — Other Ambulatory Visit: Payer: Self-pay | Admitting: Family Medicine

## 2017-08-24 MED ORDER — INSULIN GLARGINE 100 UNIT/ML SOLOSTAR PEN: 40.0000 [IU] | PEN_INJECTOR | Freq: Every day | SUBCUTANEOUS | 2 refills | Status: DC

## 2017-09-02 ENCOUNTER — Other Ambulatory Visit: Payer: Self-pay | Admitting: "Endocrinology

## 2017-09-11 ENCOUNTER — Other Ambulatory Visit: Payer: Self-pay | Admitting: "Endocrinology

## 2017-09-19 ENCOUNTER — Other Ambulatory Visit: Payer: Self-pay | Admitting: "Endocrinology

## 2017-09-21 ENCOUNTER — Other Ambulatory Visit: Payer: Self-pay | Admitting: Cardiovascular Disease

## 2017-09-21 DIAGNOSIS — I739 Peripheral vascular disease, unspecified: Secondary | ICD-10-CM

## 2017-09-21 DIAGNOSIS — Z95820 Peripheral vascular angioplasty status with implants and grafts: Secondary | ICD-10-CM

## 2017-09-24 ENCOUNTER — Telehealth: Payer: Self-pay | Admitting: "Endocrinology

## 2017-09-24 NOTE — Telephone Encounter (Signed)
Rachel [name] is asking for a 90 day supply of NOVOLOG FLEXPEN 100 UNIT/ML FlexPen  And Libre 14 day sensors, she also is asking that we let Dr. Dorris Fetch know that she does not have time to keeps logs, please advise?

## 2017-09-24 NOTE — Telephone Encounter (Signed)
She has to come for a visit with labs, meter, and logs before we refill. She may get a pen of novolog if she run out already to cover any gap.

## 2017-09-25 NOTE — Telephone Encounter (Signed)
Pt made appt

## 2017-09-26 ENCOUNTER — Other Ambulatory Visit: Payer: Self-pay | Admitting: Cardiovascular Disease

## 2017-09-26 DIAGNOSIS — Z95820 Peripheral vascular angioplasty status with implants and grafts: Secondary | ICD-10-CM

## 2017-09-26 DIAGNOSIS — I739 Peripheral vascular disease, unspecified: Secondary | ICD-10-CM

## 2017-09-28 ENCOUNTER — Other Ambulatory Visit: Payer: Self-pay | Admitting: "Endocrinology

## 2017-10-11 ENCOUNTER — Ambulatory Visit: Payer: BLUE CROSS/BLUE SHIELD | Admitting: "Endocrinology

## 2017-10-24 ENCOUNTER — Ambulatory Visit (HOSPITAL_COMMUNITY)
Admission: RE | Admit: 2017-10-24 | Payer: Medicare Other | Source: Ambulatory Visit | Attending: Cardiovascular Disease | Admitting: Cardiovascular Disease

## 2017-10-27 ENCOUNTER — Other Ambulatory Visit: Payer: Self-pay | Admitting: "Endocrinology

## 2017-10-29 ENCOUNTER — Telehealth: Payer: Self-pay | Admitting: Family Medicine

## 2017-10-29 NOTE — Telephone Encounter (Signed)
ERROR

## 2017-11-05 ENCOUNTER — Ambulatory Visit: Payer: Self-pay | Admitting: Family Medicine

## 2017-11-06 ENCOUNTER — Other Ambulatory Visit: Payer: Self-pay

## 2017-11-06 ENCOUNTER — Encounter (HOSPITAL_COMMUNITY): Payer: Self-pay | Admitting: Emergency Medicine

## 2017-11-06 ENCOUNTER — Emergency Department (HOSPITAL_COMMUNITY)
Admission: EM | Admit: 2017-11-06 | Discharge: 2017-11-06 | Disposition: A | Discharge status: Home / Self Care | Payer: Medicare Other | Attending: Emergency Medicine | Admitting: Emergency Medicine

## 2017-11-06 DIAGNOSIS — R531 Weakness: Secondary | ICD-10-CM | POA: Diagnosis present

## 2017-11-06 DIAGNOSIS — E876 Hypokalemia: Secondary | ICD-10-CM | POA: Diagnosis not present

## 2017-11-06 DIAGNOSIS — F17210 Nicotine dependence, cigarettes, uncomplicated: Secondary | ICD-10-CM | POA: Insufficient documentation

## 2017-11-06 DIAGNOSIS — E1165 Type 2 diabetes mellitus with hyperglycemia: Secondary | ICD-10-CM | POA: Diagnosis not present

## 2017-11-06 DIAGNOSIS — J45909 Unspecified asthma, uncomplicated: Secondary | ICD-10-CM | POA: Diagnosis not present

## 2017-11-06 DIAGNOSIS — Z79899 Other long term (current) drug therapy: Secondary | ICD-10-CM | POA: Diagnosis not present

## 2017-11-06 DIAGNOSIS — F329 Major depressive disorder, single episode, unspecified: Secondary | ICD-10-CM | POA: Insufficient documentation

## 2017-11-06 DIAGNOSIS — I2510 Atherosclerotic heart disease of native coronary artery without angina pectoris: Secondary | ICD-10-CM | POA: Insufficient documentation

## 2017-11-06 DIAGNOSIS — I10 Essential (primary) hypertension: Secondary | ICD-10-CM | POA: Diagnosis not present

## 2017-11-06 DIAGNOSIS — D509 Iron deficiency anemia, unspecified: Secondary | ICD-10-CM

## 2017-11-06 DIAGNOSIS — F32A Depression, unspecified: Secondary | ICD-10-CM

## 2017-11-06 DIAGNOSIS — R739 Hyperglycemia, unspecified: Secondary | ICD-10-CM

## 2017-11-06 LAB — CBC WITH DIFFERENTIAL/PLATELET
Basophils Absolute: 0 10*3/uL (ref 0.0–0.1)
Basophils Relative: 0 %
EOS ABS: 0.1 10*3/uL (ref 0.0–0.7)
Eosinophils Relative: 1 %
HEMATOCRIT: 30.1 % — AB (ref 36.0–46.0)
HEMOGLOBIN: 8.5 g/dL — AB (ref 12.0–15.0)
LYMPHS PCT: 16 %
Lymphs Abs: 1.7 10*3/uL (ref 0.7–4.0)
MCH: 21.6 pg — ABNORMAL LOW (ref 26.0–34.0)
MCHC: 28.2 g/dL — AB (ref 30.0–36.0)
MCV: 76.6 fL — AB (ref 78.0–100.0)
MONOS PCT: 6 %
Monocytes Absolute: 0.6 10*3/uL (ref 0.1–1.0)
NEUTROS PCT: 77 %
Neutro Abs: 8 10*3/uL — ABNORMAL HIGH (ref 1.7–7.7)
Platelets: 313 10*3/uL (ref 150–400)
RBC: 3.93 MIL/uL (ref 3.87–5.11)
RDW: 19.4 % — ABNORMAL HIGH (ref 11.5–15.5)
WBC: 10.4 10*3/uL (ref 4.0–10.5)

## 2017-11-06 LAB — BASIC METABOLIC PANEL
Anion gap: 15 (ref 5–15)
BUN: 12 mg/dL (ref 8–23)
CHLORIDE: 83 mmol/L — AB (ref 98–111)
CO2: 37 mmol/L — AB (ref 22–32)
Calcium: 8.6 mg/dL — ABNORMAL LOW (ref 8.9–10.3)
Creatinine, Ser: 1.65 mg/dL — ABNORMAL HIGH (ref 0.44–1.00)
GFR calc non Af Amer: 32 mL/min — ABNORMAL LOW (ref >60)
GFR, EST AFRICAN AMERICAN: 37 mL/min — AB (ref >60)
Glucose, Bld: 299 mg/dL — ABNORMAL HIGH (ref 70–99)
SODIUM: 135 mmol/L (ref 135–145)

## 2017-11-06 LAB — CBG MONITORING, ED: Glucose-Capillary: 310 mg/dL — ABNORMAL HIGH (ref 70–99)

## 2017-11-06 MED ORDER — POTASSIUM CHLORIDE CRYS ER 20 MEQ PO TBCR
40.0000 meq | EXTENDED_RELEASE_TABLET | Freq: Once | ORAL | Status: AC
  Administered 2017-11-06: 40 meq via ORAL
  Filled 2017-11-06: qty 2

## 2017-11-06 MED ORDER — SODIUM CHLORIDE 0.9 % IV SOLN
INTRAVENOUS | Status: DC | PRN
  Administered 2017-11-06: 1000 mL via INTRAVENOUS

## 2017-11-06 MED ORDER — POTASSIUM CHLORIDE CRYS ER 20 MEQ PO TBCR: 20.0000 meq | EXTENDED_RELEASE_TABLET | Freq: Two times a day (BID) | ORAL | 0 refills | Status: DC

## 2017-11-06 MED ORDER — POTASSIUM CHLORIDE 10 MEQ/100ML IV SOLN
10.0000 meq | INTRAVENOUS | Status: AC
Start: 2017-11-06 — End: 2017-11-06
  Administered 2017-11-06 (×3): 10 meq via INTRAVENOUS
  Filled 2017-11-06 (×3): qty 100

## 2017-11-06 NOTE — ED Triage Notes (Signed)
Pt reports being tired x 2 weeks.  States she took a tramadol x 2 weeks ago and has not felt the same since.  Denies any pain.

## 2017-11-06 NOTE — ED Notes (Signed)
Pt given sprite 

## 2017-11-06 NOTE — ED Notes (Signed)
Pt requested the CBG be taken 310 RN aware.

## 2017-11-06 NOTE — ED Notes (Signed)
Cardiac monitoring applied.

## 2017-11-06 NOTE — ED Provider Notes (Addendum)
[facility] EMERGENCY DEPARTMENT Provider Note   CSN: [id number] Arrival date & time: 11/06/17  1202     History   Chief Complaint Chief Complaint  Patient presents with  . Weakness    HPI Rachel [name] is a 67 y.o. female.  HPI   She presents for evaluation of general weakness, tiredness, sleeping too much and lack of interest in life.  He denies suicidal ideation.  She has not seen a therapist.  She is not being treated for depression at this time.  She states that she is taking her medicines as prescribed.  She did have some trouble with obtaining her medicines but now has a way to get her medicines.  She denies headache, chest pain, shortness of breath, nausea, vomiting, focal weakness or paresthesia.  She helps to care for her granddaughter during the week while she is living in [place], on weekends the granddaughter goes to [facility] to help with her mother.  There are no other known modifying factors.  Past Medical History:  Diagnosis Date  . Asthma   . Coronary atherosclerosis of native coronary artery    DES x 2 (overlapping) RCA 03/2008 - patent 2011  . Diabetes type 2, controlled ([place]) 2000  . Essential hypertension, benign   . Fatty liver   . GERD (gastroesophageal reflux disease)   . Hyperlipidemia   . Microalbuminuria   . Obesity   . Odynophagia    Severe esophagitis on CT 05/2010, treated empirically with Diflucan  . PAD (peripheral artery disease) (HCC)    history of left greater than right lower 70 claudication  . Tubular adenoma of colon 07/2007   Multiple colonic polyps  . Venous stasis     Patient Active Problem List   Diagnosis Date Noted  . Allergic rhinitis 06/26/2017  . Carotid artery disease ([place]) 06/20/2017  . COPD (chronic obstructive pulmonary disease) ([place]) 05/10/2017  . Vitamin D deficiency 02/15/2017  . Personal history of noncompliance with medical treatment, presenting hazards to health 03/30/2016  . Acute  cholecystitis 07/23/2015  . Pseudoaneurysm of left femoral artery ([place]) 10/17/2014  . Claudication ([place]) 10/15/2014  . Hypokalemia 08/28/2014  . AKI (acute kidney injury) ([place]) 08/28/2014  . Diarrhea 08/28/2014  . High anion gap metabolic acidosis 88/28/0034  . Hypothyroidism 02/07/2014  . Essential hypertension, benign 10/13/2013  . Peripheral arterial disease ([place]) 05/22/2013  . Meniere's disease 04/07/2013  . DM type 2 causing vascular disease ([place]) 03/11/2009  . Mixed hyperlipidemia 03/11/2009  . TOBACCO ABUSE 03/11/2009  . Coronary atherosclerosis of native coronary artery 03/11/2009    Past Surgical History:  Procedure Laterality Date  . CARDIAC CATHETERIZATION     stents  . CHOLECYSTECTOMY N/A 07/30/2015   Procedure: LAPAROSCOPIC CHOLECYSTECTOMY;  Surgeon: Aviva Signs, MD;  Location: [facility];  Service: General;  Laterality: N/A;  . COLONOSCOPY W/ POLYPECTOMY  07/2007   Tubular adenoma  . CORONARY STENT PLACEMENT    . CYSTECTOMY     Pilonidal  . ESOPHAGOGASTRODUODENOSCOPY  04/2010   RMR: Normal esophagus , Stomach D1 and D2   . LOWER EXTREMITY ANGIOGRAPHY N/A 03/13/2016   Procedure: Lower Extremity Angiography;  Surgeon: [name], MD;  Location: [place] [facility];  Service: Cardiovascular;  Laterality: N/A;  . PERIPHERAL VASCULAR ATHERECTOMY  03/27/2016   Procedure: Peripheral Vascular Atherectomy;  Surgeon: [name], MD;  Location: [place] [facility];  Service: Cardiovascular;;  Attempted atherectomy  . PERIPHERAL VASCULAR CATHETERIZATION Bilateral 09/28/2014  Procedure: Lower Extremity Angiography;  Surgeon: [name], MD;  Location: [place] [facility];  Service: Cardiovascular;  Laterality: Bilateral;  . PERIPHERAL VASCULAR CATHETERIZATION N/A 09/28/2014   Procedure: Abdominal Aortogram;  Surgeon: [name], MD;  Location: [place] [facility];  Service: Cardiovascular;  Laterality: N/A;  . PERIPHERAL VASCULAR CATHETERIZATION N/A 10/15/2014    Procedure: Lower Extremity Angiography;  Surgeon: [name], MD;  Location: [place] [facility];  Service: Cardiovascular;  Laterality: N/A;  . PERIPHERAL VASCULAR CATHETERIZATION  10/15/2014   Procedure: Peripheral Vascular Intervention;  Surgeon: [name], MD;  Location: [place] [facility];  Service: Cardiovascular;;  bilateral iliac stents  . PILONIDAL CYST / SINUS EXCISION    . TUBAL LIGATION Bilateral      OB History   None      Home Medications    Prior to Admission medications   Medication Sig Start Date End Date Taking? Authorizing Provider  albuterol (PROAIR HFA) 108 (90 Base) MCG/ACT inhaler Inhale 2 puffs into the lungs every 4 (four) hours as needed for wheezing or shortness of breath. 05/10/17  Yes Collene Gobble, MD  aspirin EC 81 MG tablet Take 81 mg by mouth daily.     Yes [provider]  clopidogrel (PLAVIX) 75 MG tablet Take 1 tablet (75 mg total) by mouth daily. 01/05/17  Yes [name], MD  fluticasone (FLONASE) 50 MCG/ACT nasal spray Place 2 sprays into both nostrils daily. Patient taking differently: Place 2 sprays into both nostrils 2 (two) times daily.  06/26/17  Yes Collene Gobble, MD  gabapentin (NEURONTIN) 400 MG capsule TAKE ONE CAPSULE BY MOUTH AT BEDTIME AS DIRECTED Patient taking differently: Take 400 mg by mouth at bedtime.  07/26/17  Yes Mikey Kirschner, MD  hydrochlorothiazide (MICROZIDE) 12.5 MG capsule Take 1 capsule (12.5 mg total) by mouth daily. 07/26/17  Yes Mikey Kirschner, MD  Insulin Glargine (LANTUS) 100 UNIT/ML Solostar Pen Inject 40 Units into the skin daily. Patient taking differently: Inject 14-16 Units into the skin at bedtime.  08/24/17  Yes Nida, Marella Chimes, MD  levothyroxine (SYNTHROID, LEVOTHROID) 100 MCG tablet TAKE 1 TABLET BY MOUTH EVERY DAY BEFORE BREAKFAST Patient taking differently: Take 100 mcg by mouth daily before breakfast.  09/19/17  Yes Nida, Marella Chimes, MD  meclizine (ANTIVERT) 25 MG  tablet Take 1 tablet (25 mg total) by mouth 3 (three) times daily as needed for dizziness. 05/23/17  Yes Mikey Kirschner, MD  NOVOLOG FLEXPEN 100 UNIT/ML FlexPen INJECT 14-20 UNITS 3 TIMES DAILY WITH MEALS INTO THE SKIN Patient taking differently: Inject 14-20 Units into the skin 3 (three) times daily with meals.  07/10/17  Yes Cassandria Anger, MD  pantoprazole (PROTONIX) 40 MG tablet Take 1 tablet (40 mg total) by mouth daily. 07/26/17  Yes Mikey Kirschner, MD  rosuvastatin (CRESTOR) 40 MG tablet TAKE 1 TABLET EVERY DAY AT BEDTIME Patient taking differently: Take 40 mg by mouth every evening.  07/10/17  Yes [name], MD  Tiotropium Bromide Monohydrate (SPIRIVA RESPIMAT) 2.5 MCG/ACT AERS Inhale 2 puffs into the lungs daily. 06/14/17  Yes Collene Gobble, MD  Vitamin D, Ergocalciferol, (DRISDOL) 50000 units CAPS capsule TAKE 1 CAPSULE (50,000 UNITS TOTAL) BY MOUTH EVERY 7 (SEVEN) DAYS. 09/12/17  Yes Nida, Marella Chimes, MD  CONTOUR NEXT TEST test strip USE AS DIRECTED 4 X DAILY. E11.65 10/02/17   Cassandria Anger, MD  Insulin Pen Needle (B-D ULTRAFINE  III SHORT PEN) 31G X 8 MM MISC 1 each by Does not apply route as directed. 04/06/16   Cassandria Anger, MD  oxybutynin (DITROPAN) 5 MG tablet Take 1 tablet (5 mg total) by mouth 2 (two) times daily. Patient not taking: Reported on 08/04/2017 07/26/17   Mikey Kirschner, MD    Family History Family History  Problem Relation Age of Onset  . Bone cancer Father   . Heart disease Maternal Grandmother   . Hypertension Mother   . Colon cancer Neg Hx   . Liver disease Neg Hx   . Inflammatory bowel disease Neg Hx     Social History Social History   Tobacco Use  . Smoking status: Current Every Day Smoker    Packs/day: 0.25    Years: 50.00    Pack years: 12.50    Types: Cigarettes  . Smokeless tobacco: Never Used  Substance Use Topics  . Alcohol use: No  . Drug use: No     Allergies   Altace [ramipril]; Biaxin  [clarithromycin]; Niacin and related; Nsaids; Tramadol; and Adhesive [tape]   Review of Systems Review of Systems  All other systems reviewed and are negative.    Physical Exam Updated Vital Signs BP (!) 118/56   Pulse 89   Temp 98.2 F (36.8 C) (Oral)   Resp 20   Ht 5\' 3"  (1.6 m)   Wt 68 kg   SpO2 96%   BMI 26.57 kg/m   Physical Exam  Constitutional: She is oriented to person, place, and time. She appears well-developed and well-nourished. She appears distressed (She is uncomfortable).  HENT:  Head: Normocephalic and atraumatic.  Eyes: Pupils are equal, round, and reactive to light. Conjunctivae and EOM are normal.  Neck: Normal range of motion and phonation normal. Neck supple.  Cardiovascular: Normal rate.  Pulmonary/Chest: Effort normal. She exhibits no tenderness.  Musculoskeletal: Normal range of motion.  Neurological: She is alert and oriented to person, place, and time. She exhibits normal muscle tone.  Skin: Skin is warm and dry.  Psychiatric: Her behavior is normal. Judgment and thought content normal.  She appears sad  Nursing note and vitals reviewed.    ED Treatments / Results  Labs (all labs ordered are listed, but only abnormal results are displayed) Labs Reviewed  BASIC METABOLIC PANEL - Abnormal; Notable for the following components:      Result Value   Potassium <2.0 (*)    Chloride 83 (*)    CO2 37 (*)    Glucose, Bld 299 (*)    Creatinine, Ser 1.65 (*)    Calcium 8.6 (*)    GFR calc non Af Amer 32 (*)    GFR calc Af Amer 37 (*)    All other components within normal limits  CBC WITH DIFFERENTIAL/PLATELET - Abnormal; Notable for the following components:   Hemoglobin 8.5 (*)    HCT 30.1 (*)    MCV 76.6 (*)    MCH 21.6 (*)    MCHC 28.2 (*)    RDW 19.4 (*)    Neutro Abs 8.0 (*)    All other components within normal limits  CBG MONITORING, ED - Abnormal; Notable for the following components:   Glucose-Capillary 310 (*)    All other  components within normal limits  URINALYSIS, ROUTINE W REFLEX MICROSCOPIC    EKG None  Radiology No results found.  Procedures .Critical Care Performed by: Daleen Bo, MD Authorized by: Daleen Bo, MD   Critical care  provider statement:    Critical care time (minutes):  40   Critical care start time:  11/06/2017 12:15 PM   Critical care end time:  11/06/2017 6:29 PM   Critical care time was exclusive of:  Separately billable procedures and treating other patients   Critical care was necessary to treat or prevent imminent or life-threatening deterioration of the following conditions:  Metabolic crisis   Critical care was time spent personally by me on the following activities:  Blood draw for specimens, development of treatment plan with patient or surrogate, discussions with consultants, evaluation of patient's response to treatment, examination of patient, obtaining history from patient or surrogate, ordering and performing treatments and interventions, ordering and review of laboratory studies, pulse oximetry, re-evaluation of patient's condition, review of old charts and ordering and review of radiographic studies   (including critical care time)  Medications Ordered in ED Medications  0.9 %  sodium chloride infusion (1,000 mLs Intravenous New Bag/Given 11/06/17 1412)  potassium chloride SA (K-DUR,KLOR-CON) CR tablet 40 mEq (40 mEq Oral Given 11/06/17 1415)  potassium chloride 10 mEq in 100 mL IVPB (10 mEq Intravenous New Bag/Given 11/06/17 1622)     Initial Impression / Assessment and Plan / ED Course  I have reviewed the triage vital signs and the nursing notes.  Pertinent labs & imaging results that were available during my care of the patient were reviewed by me and considered in my medical decision making (see chart for details).  Clinical Course as of Nov 07 1818  Tue Nov 06, 2017  1815 High  CBG monitoring, ED(!) [EW]  1815 Normal except potassium low, chloride  low, CO2 high, glucose high, creatinine high, calcium low, GFR low  Basic metabolic panel(!!) [EW]  0349 Normal except hemoglobin low, MCV low  CBC with Differential(!) [EW]    Clinical Course User Index [EW] Daleen Bo, MD     Patient Vitals for the past 24 hrs:  BP Temp Temp src Pulse Resp SpO2 Height Weight  11/06/17 1630 (!) 118/56 - - - 20 - - -  11/06/17 1600 (!) 106/59 - - - 18 - - -  11/06/17 1530 100/84 - - 89 17 96 % - -  11/06/17 1501 (!) 107/54 - - 78 16 98 % - -  11/06/17 1500 (!) 107/54 - - 78 (!) 24 93 % - -  11/06/17 1430 (!) 99/48 - - 79 (!) 22 95 % - -  11/06/17 1210 - - - - - - 5\' 3"  (1.6 m) 68 kg  11/06/17 1209 (!) 112/56 98.2 F (36.8 C) Oral 89 18 95 % - -    6:14 PM Reevaluation with update and discussion. After initial assessment and treatment, an updated evaluation reveals he is comfortable now.  She complains of "head congestion.  She states Mucinex is not helping.  I advised her to use her Flonase twice a day and take it daily antihistamine treatment such as Claritin and findings discussed and questions answered. Daleen Bo   Medical Decision Making: Presented with malaise, and depression, without suicidal ideation.  Incidental metabolic abnormalities, likely related to medication usage.  Also incidental anemia, which is iron deficient.  This will need follow-up in the outpatient setting.  Doubt serious bacterial infection, metabolic instability or impending vascular collapse.  CRITICAL CARE-yes Performed by: Daleen Bo   Nursing Notes Reviewed/ Care Coordinated Applicable Imaging Reviewed Interpretation of Laboratory Data incorporated into ED treatment  The patient appears reasonably screened and/or stabilized for  discharge and I doubt any other medical condition or other [facility] requiring further screening, evaluation, or treatment in the ED at this time prior to discharge.  Plan: Home Medications-continue current medications; Home  Treatments-drink plenty of fluids; return here if the recommended treatment, does not improve the symptoms; Recommended follow up-follow-up PCP as soon as possible for evaluation and treatment of the multiple medical abnormalities that were found as an incidental findings.     Final Clinical Impressions(s) / ED Diagnoses   Final diagnoses:  Depression, unspecified depression type  Hypokalemia  Hyperglycemia  Iron deficiency anemia, unspecified iron deficiency anemia type    ED Discharge Orders    None       Daleen Bo, MD 11/06/17 Ladoris Gene    Daleen Bo, MD 11/06/17 [phone]

## 2017-11-06 NOTE — ED Notes (Signed)
Date and time results received: 11/06/17 1334. (use smartphrase ".now" to insert current time)  Test: Potassium Critical Value: <2.0  Name of Provider Notified:Dr Eulis Foster Orders Received? Or Actions Taken?:NA

## 2017-11-06 NOTE — Discharge Instructions (Addendum)
For your depression consider seeing a therapist as soon as possible.  Use the resource guide to help you find a doctor.  There were several abnormalities found today including low potassium and hemoglobin level.  The potassium will require treatment with medication, and close follow-up with your primary care doctor.  Hemoglobin level will probably improve if you take iron pills, twice a day for 1 month.  Make sure you talk to your doctor about it next week.  Your kidney function is a little bit low, and will need follow-up by your doctor.  Return here, if needed, for problems.

## 2017-11-06 NOTE — ED Notes (Signed)
Pt states she is still unable to give a urine sample at this time.

## 2017-11-08 ENCOUNTER — Ambulatory Visit (HOSPITAL_BASED_OUTPATIENT_CLINIC_OR_DEPARTMENT_OTHER)
Admission: RE | Admit: 2017-11-08 | Discharge: 2017-11-08 | Disposition: A | Discharge status: Home / Self Care | Payer: Medicare Other | Source: Ambulatory Visit | Attending: Cardiovascular Disease | Admitting: Cardiovascular Disease

## 2017-11-08 ENCOUNTER — Ambulatory Visit (HOSPITAL_COMMUNITY)
Admission: RE | Admit: 2017-11-08 | Discharge: 2017-11-08 | Disposition: A | Discharge status: Home / Self Care | Payer: Medicare Other | Source: Ambulatory Visit | Attending: Cardiology | Admitting: Cardiology

## 2017-11-08 DIAGNOSIS — I739 Peripheral vascular disease, unspecified: Secondary | ICD-10-CM | POA: Diagnosis present

## 2017-11-08 DIAGNOSIS — Z95820 Peripheral vascular angioplasty status with implants and grafts: Secondary | ICD-10-CM | POA: Diagnosis present

## 2017-11-09 ENCOUNTER — Other Ambulatory Visit: Payer: Self-pay | Admitting: *Deleted

## 2017-11-09 DIAGNOSIS — I739 Peripheral vascular disease, unspecified: Secondary | ICD-10-CM

## 2017-11-12 ENCOUNTER — Other Ambulatory Visit: Payer: Self-pay | Admitting: *Deleted

## 2017-11-12 ENCOUNTER — Ambulatory Visit (INDEPENDENT_AMBULATORY_CARE_PROVIDER_SITE_OTHER): Payer: Medicare Other | Admitting: Family Medicine

## 2017-11-12 ENCOUNTER — Encounter: Payer: Self-pay | Admitting: Family Medicine

## 2017-11-12 VITALS — BP 122/78 | Ht 63.0 in | Wt 149.4 lb

## 2017-11-12 DIAGNOSIS — E119 Type 2 diabetes mellitus without complications: Secondary | ICD-10-CM | POA: Diagnosis not present

## 2017-11-12 DIAGNOSIS — E876 Hypokalemia: Secondary | ICD-10-CM

## 2017-11-12 DIAGNOSIS — D649 Anemia, unspecified: Secondary | ICD-10-CM

## 2017-11-12 DIAGNOSIS — I6521 Occlusion and stenosis of right carotid artery: Secondary | ICD-10-CM | POA: Diagnosis not present

## 2017-11-12 DIAGNOSIS — Z23 Encounter for immunization: Secondary | ICD-10-CM | POA: Diagnosis not present

## 2017-11-12 DIAGNOSIS — I739 Peripheral vascular disease, unspecified: Secondary | ICD-10-CM

## 2017-11-12 NOTE — Progress Notes (Signed)
   Subjective:    Patient ID: Rachel [name], female    DOB: Jun 22, 1950, 67 y.o.   MRN: [id number]  HPI Patient arrives and would like to discuss referral to a new endocrinologist. Patient doesn't want to continue to see Dr Dorris Fetch and would like to see new endocrinologist for her diabetes.  States her diabetes control not the best.  Just does not like Dr. Dorris Fetch wants to see a new endocrinologist    pt had tramadol reaction which sent her to the e r  Felt down and achey,  Had very low potassium, has taken supplement since then   No recent blood in stools, stools different loose at times and sometimes rg, no constip  Patient claims that the ER did not tell her about her substantial anemia.  In order they recommend a follow-up regarding her potassium.  Nor did they obtain follow-up potassium in the emergency room.  It appears that this is accurate by looking at the ER report    Review of Systems No headache, no major weight loss or weight gain, no chest pain no back pain abdominal pain no change in bowel habits complete ROS otherwise negative     Objective:   Physical Exam  Alert and oriented, vitals reviewed and stable, NAD ENT-TM's and ext canals WNL bilat via otoscopic exam Soft palate, tonsils and post pharynx WNL via oropharyngeal exam Neck-symmetric, no masses; thyroid nonpalpable and nontender Pulmonary-no tachypnea or accessory muscle use; Clear without wheezes via auscultation Card--no abnrml murmurs, rhythm reg and rate WNL Carotid pulses symmetric, without bruits       Assessment & Plan:  Impression 1 iron deficiency anemia.  Etiology unclear.  Hemoglobin good last year.  No noticeable blood.  Patient has not had a colonoscopy for nearly 10 years.  Discussed.  Need to check stool for hidden blood.  Will repeat CBC.  GI referral.  2.  Hypokalemia severe in nature.  Patient now on supplement.  Since blood pressure is borderline low we will go ahead and stop  hydrochlorothiazide.  Will check stat met 7  3.  Hypertension blood pressure controlled type and challenged by hypokalemia will hold off on HCTZ rationale discussed.  4.  Type 2 diabetes referral on to new specialist  Further recommendations based on blood work

## 2017-11-13 ENCOUNTER — Encounter (HOSPITAL_COMMUNITY): Payer: Self-pay

## 2017-11-13 ENCOUNTER — Other Ambulatory Visit: Payer: Self-pay

## 2017-11-13 ENCOUNTER — Inpatient Hospital Stay (HOSPITAL_COMMUNITY)
Admission: EM | Admit: 2017-11-13 | Discharge: 2017-11-15 | DRG: 641 | Disposition: A | Discharge status: Home / Self Care | Payer: Medicare Other | Attending: Internal Medicine | Admitting: Internal Medicine

## 2017-11-13 DIAGNOSIS — J449 Chronic obstructive pulmonary disease, unspecified: Secondary | ICD-10-CM | POA: Diagnosis present

## 2017-11-13 DIAGNOSIS — Z8249 Family history of ischemic heart disease and other diseases of the circulatory system: Secondary | ICD-10-CM

## 2017-11-13 DIAGNOSIS — Z886 Allergy status to analgesic agent status: Secondary | ICD-10-CM

## 2017-11-13 DIAGNOSIS — I739 Peripheral vascular disease, unspecified: Secondary | ICD-10-CM | POA: Diagnosis present

## 2017-11-13 DIAGNOSIS — I878 Other specified disorders of veins: Secondary | ICD-10-CM | POA: Diagnosis present

## 2017-11-13 DIAGNOSIS — E559 Vitamin D deficiency, unspecified: Secondary | ICD-10-CM | POA: Diagnosis present

## 2017-11-13 DIAGNOSIS — Z79890 Hormone replacement therapy (postmenopausal): Secondary | ICD-10-CM

## 2017-11-13 DIAGNOSIS — Z794 Long term (current) use of insulin: Secondary | ICD-10-CM

## 2017-11-13 DIAGNOSIS — K219 Gastro-esophageal reflux disease without esophagitis: Secondary | ICD-10-CM

## 2017-11-13 DIAGNOSIS — Z809 Family history of malignant neoplasm, unspecified: Secondary | ICD-10-CM

## 2017-11-13 DIAGNOSIS — Z8719 Personal history of other diseases of the digestive system: Secondary | ICD-10-CM

## 2017-11-13 DIAGNOSIS — E876 Hypokalemia: Principal | ICD-10-CM | POA: Diagnosis present

## 2017-11-13 DIAGNOSIS — R531 Weakness: Secondary | ICD-10-CM

## 2017-11-13 DIAGNOSIS — I2510 Atherosclerotic heart disease of native coronary artery without angina pectoris: Secondary | ICD-10-CM | POA: Diagnosis present

## 2017-11-13 DIAGNOSIS — E1142 Type 2 diabetes mellitus with diabetic polyneuropathy: Secondary | ICD-10-CM | POA: Diagnosis present

## 2017-11-13 DIAGNOSIS — E1165 Type 2 diabetes mellitus with hyperglycemia: Secondary | ICD-10-CM | POA: Diagnosis present

## 2017-11-13 DIAGNOSIS — E039 Hypothyroidism, unspecified: Secondary | ICD-10-CM | POA: Diagnosis present

## 2017-11-13 DIAGNOSIS — Z79899 Other long term (current) drug therapy: Secondary | ICD-10-CM

## 2017-11-13 DIAGNOSIS — E782 Mixed hyperlipidemia: Secondary | ICD-10-CM | POA: Diagnosis present

## 2017-11-13 DIAGNOSIS — Z885 Allergy status to narcotic agent status: Secondary | ICD-10-CM

## 2017-11-13 DIAGNOSIS — Z9049 Acquired absence of other specified parts of digestive tract: Secondary | ICD-10-CM

## 2017-11-13 DIAGNOSIS — K760 Fatty (change of) liver, not elsewhere classified: Secondary | ICD-10-CM | POA: Diagnosis present

## 2017-11-13 DIAGNOSIS — E038 Other specified hypothyroidism: Secondary | ICD-10-CM

## 2017-11-13 DIAGNOSIS — D509 Iron deficiency anemia, unspecified: Secondary | ICD-10-CM | POA: Diagnosis present

## 2017-11-13 DIAGNOSIS — Z9851 Tubal ligation status: Secondary | ICD-10-CM

## 2017-11-13 DIAGNOSIS — F17200 Nicotine dependence, unspecified, uncomplicated: Secondary | ICD-10-CM

## 2017-11-13 DIAGNOSIS — E1159 Type 2 diabetes mellitus with other circulatory complications: Secondary | ICD-10-CM | POA: Diagnosis not present

## 2017-11-13 DIAGNOSIS — D649 Anemia, unspecified: Secondary | ICD-10-CM | POA: Diagnosis not present

## 2017-11-13 DIAGNOSIS — Z7982 Long term (current) use of aspirin: Secondary | ICD-10-CM

## 2017-11-13 DIAGNOSIS — Z955 Presence of coronary angioplasty implant and graft: Secondary | ICD-10-CM

## 2017-11-13 DIAGNOSIS — Z881 Allergy status to other antibiotic agents status: Secondary | ICD-10-CM

## 2017-11-13 DIAGNOSIS — I10 Essential (primary) hypertension: Secondary | ICD-10-CM | POA: Diagnosis not present

## 2017-11-13 DIAGNOSIS — Z91048 Other nonmedicinal substance allergy status: Secondary | ICD-10-CM

## 2017-11-13 DIAGNOSIS — Z716 Tobacco abuse counseling: Secondary | ICD-10-CM

## 2017-11-13 DIAGNOSIS — F17210 Nicotine dependence, cigarettes, uncomplicated: Secondary | ICD-10-CM | POA: Diagnosis present

## 2017-11-13 LAB — CBC WITH DIFFERENTIAL/PLATELET
BASOS ABS: 0 10*3/uL (ref 0.0–0.2)
BASOS ABS: 0 10*3/uL (ref 0.0–0.1)
BASOS PCT: 0 %
BASOS: 0 %
EOS (ABSOLUTE): 0.2 10*3/uL (ref 0.0–0.4)
Eos: 2 %
Eosinophils Absolute: 0.2 10*3/uL (ref 0.0–0.5)
Eosinophils Relative: 2 %
HEMATOCRIT: 28.2 % — AB (ref 34.0–46.6)
HEMATOCRIT: 26.5 % — AB (ref 36.0–46.0)
HEMOGLOBIN: 8.2 g/dL — AB (ref 11.1–15.9)
HEMOGLOBIN: 7.5 g/dL — AB (ref 12.0–15.0)
IMMATURE GRANS (ABS): 0.1 10*3/uL (ref 0.0–0.1)
Immature Granulocytes: 1 %
Lymphocytes Absolute: 1.9 10*3/uL (ref 0.7–3.1)
Lymphocytes Relative: 17 %
Lymphs Abs: 1.3 10*3/uL (ref 0.7–4.0)
Lymphs: 18 %
MCH: 22.2 pg — AB (ref 26.6–33.0)
MCH: 22.2 pg — ABNORMAL LOW (ref 26.0–34.0)
MCHC: 29.1 g/dL — AB (ref 31.5–35.7)
MCHC: 28.3 g/dL — AB (ref 30.0–36.0)
MCV: 76 fL — AB (ref 79–97)
MCV: 78.4 fL — ABNORMAL LOW (ref 80.0–100.0)
MONOCYTES: 5 %
MONOS PCT: 5 %
Monocytes Absolute: 0.6 10*3/uL (ref 0.1–0.9)
Monocytes Absolute: 0.4 10*3/uL (ref 0.1–1.0)
NEUTROS ABS: 8 10*3/uL — AB (ref 1.4–7.0)
NEUTROS ABS: 5.7 10*3/uL (ref 1.7–7.7)
NEUTROS PCT: 76 %
Neutrophils: 74 %
Platelets: 343 10*3/uL (ref 150–450)
Platelets: 273 10*3/uL (ref 150–400)
RBC: 3.69 x10E6/uL — ABNORMAL LOW (ref 3.77–5.28)
RBC: 3.38 MIL/uL — AB (ref 3.87–5.11)
RDW: 20 % — ABNORMAL HIGH (ref 12.3–15.4)
RDW: 20.8 % — ABNORMAL HIGH (ref 11.5–15.5)
WBC: 10.8 10*3/uL (ref 3.4–10.8)
WBC: 7.5 10*3/uL (ref 4.0–10.5)
nRBC: 0 % (ref 0.0–0.2)

## 2017-11-13 LAB — ABO/RH: ABO/RH(D): O POS

## 2017-11-13 LAB — VITAMIN B12: VITAMIN B 12: 684 pg/mL (ref 180–914)

## 2017-11-13 LAB — COMPREHENSIVE METABOLIC PANEL
ALBUMIN: 2.9 g/dL — AB (ref 3.5–5.0)
ALK PHOS: 78 U/L (ref 38–126)
ALT: 9 U/L (ref 0–44)
AST: 17 U/L (ref 15–41)
Anion gap: 10 (ref 5–15)
BUN: 7 mg/dL — AB (ref 8–23)
CALCIUM: 8.6 mg/dL — AB (ref 8.9–10.3)
CO2: 33 mmol/L — ABNORMAL HIGH (ref 22–32)
CREATININE: 0.96 mg/dL (ref 0.44–1.00)
Chloride: 94 mmol/L — ABNORMAL LOW (ref 98–111)
GFR calc Af Amer: >60 mL/min (ref >60)
GFR calc non Af Amer: >60 mL/min (ref >60)
GLUCOSE: 236 mg/dL — AB (ref 70–99)
Potassium: <2 mmol/L — CL (ref 3.5–5.1)
Sodium: 137 mmol/L (ref 135–145)
Total Bilirubin: 0.6 mg/dL (ref 0.3–1.2)
Total Protein: 6.5 g/dL (ref 6.5–8.1)

## 2017-11-13 LAB — HEMOGLOBIN A1C
HEMOGLOBIN A1C: 8 % — AB (ref 4.8–5.6)
Mean Plasma Glucose: 182.9 mg/dL

## 2017-11-13 LAB — BASIC METABOLIC PANEL
BUN/Creatinine Ratio: 10 — ABNORMAL LOW (ref 12–28)
BUN: 10 mg/dL (ref 8–27)
CHLORIDE: 90 mmol/L — AB (ref 96–106)
CO2: 31 mmol/L — ABNORMAL HIGH (ref 20–29)
Calcium: 9 mg/dL (ref 8.7–10.3)
Creatinine, Ser: 1.03 mg/dL — ABNORMAL HIGH (ref 0.57–1.00)
GFR, EST AFRICAN AMERICAN: 66 mL/min/{1.73_m2} (ref >59)
GFR, EST NON AFRICAN AMERICAN: 57 mL/min/{1.73_m2} — AB (ref >59)
Glucose: 243 mg/dL — ABNORMAL HIGH (ref 65–99)
Potassium: 2.4 mmol/L — CL (ref 3.5–5.2)
Sodium: 138 mmol/L (ref 134–144)

## 2017-11-13 LAB — IRON AND TIBC
IRON: 16 ug/dL — AB (ref 28–170)
Saturation Ratios: 4 % — ABNORMAL LOW (ref 10.4–31.8)
TIBC: 380 ug/dL (ref 250–450)
UIBC: 364 ug/dL

## 2017-11-13 LAB — PHOSPHORUS: Phosphorus: 2.3 mg/dL — ABNORMAL LOW (ref 2.5–4.6)

## 2017-11-13 LAB — RETICULOCYTES
IMMATURE RETIC FRACT: 16.3 % — AB (ref 2.3–15.9)
RBC.: 3.11 MIL/uL — ABNORMAL LOW (ref 3.87–5.11)
RETIC COUNT ABSOLUTE: 65 10*3/uL (ref 19.0–186.0)
RETIC CT PCT: 2.1 % (ref 0.4–3.1)

## 2017-11-13 LAB — MAGNESIUM: Magnesium: 2 mg/dL (ref 1.7–2.4)

## 2017-11-13 LAB — TSH: TSH: 0.298 u[IU]/mL — ABNORMAL LOW (ref 0.350–4.500)

## 2017-11-13 LAB — FOLATE: Folate: 8.1 ng/mL (ref >5.9)

## 2017-11-13 LAB — GLUCOSE, CAPILLARY
GLUCOSE-CAPILLARY: 330 mg/dL — AB (ref 70–99)
GLUCOSE-CAPILLARY: 281 mg/dL — AB (ref 70–99)

## 2017-11-13 LAB — POC OCCULT BLOOD, ED: Fecal Occult Bld: NEGATIVE

## 2017-11-13 LAB — FERRITIN: FERRITIN: 3 ng/mL — AB (ref 11–307)

## 2017-11-13 MED ORDER — SODIUM CHLORIDE 0.9 % IV SOLN
INTRAVENOUS | Status: DC | PRN
  Administered 2017-11-13: 11:00:00 via INTRAVENOUS

## 2017-11-13 MED ORDER — ROSUVASTATIN CALCIUM 20 MG PO TABS
40.0000 mg | ORAL_TABLET | Freq: Every evening | ORAL | Status: DC
  Administered 2017-11-13 – 2017-11-14 (×2): 40 mg via ORAL
  Filled 2017-11-13 (×2): qty 2

## 2017-11-13 MED ORDER — INSULIN ASPART 100 UNIT/ML ~~LOC~~ SOLN
0.0000 [IU] | Freq: Three times a day (TID) | SUBCUTANEOUS | Status: DC
  Administered 2017-11-13: 11 [IU] via SUBCUTANEOUS
  Administered 2017-11-14 (×3): 3 [IU] via SUBCUTANEOUS

## 2017-11-13 MED ORDER — SODIUM CHLORIDE 0.9 % IV SOLN: INTRAVENOUS | Status: DC

## 2017-11-13 MED ORDER — TIOTROPIUM BROMIDE MONOHYDRATE 18 MCG IN CAPS
1.0000 | ORAL_CAPSULE | Freq: Every day | RESPIRATORY_TRACT | Status: DC
  Administered 2017-11-14 – 2017-11-15 (×2): 18 ug via RESPIRATORY_TRACT
  Filled 2017-11-13: qty 5

## 2017-11-13 MED ORDER — INSULIN GLARGINE 100 UNIT/ML ~~LOC~~ SOLN
30.0000 [IU] | Freq: Every day | SUBCUTANEOUS | Status: DC
  Administered 2017-11-13 – 2017-11-14 (×2): 30 [IU] via SUBCUTANEOUS
  Filled 2017-11-13 (×3): qty 0.3

## 2017-11-13 MED ORDER — PANTOPRAZOLE SODIUM 40 MG PO TBEC
40.0000 mg | DELAYED_RELEASE_TABLET | Freq: Every day | ORAL | Status: DC
  Administered 2017-11-13 – 2017-11-15 (×3): 40 mg via ORAL
  Filled 2017-11-13 (×3): qty 1

## 2017-11-13 MED ORDER — VITAMIN D (ERGOCALCIFEROL) 1.25 MG (50000 UNIT) PO CAPS
50000.0000 [IU] | ORAL_CAPSULE | ORAL | Status: DC
  Administered 2017-11-14: 50000 [IU] via ORAL
  Filled 2017-11-13: qty 1

## 2017-11-13 MED ORDER — ASPIRIN EC 81 MG PO TBEC
81.0000 mg | DELAYED_RELEASE_TABLET | Freq: Every day | ORAL | Status: DC
  Administered 2017-11-13 – 2017-11-15 (×3): 81 mg via ORAL
  Filled 2017-11-13 (×3): qty 1

## 2017-11-13 MED ORDER — HYDROCOD POLST-CPM POLST ER 10-8 MG/5ML PO SUER
5.0000 mL | Freq: Two times a day (BID) | ORAL | Status: DC | PRN
  Administered 2017-11-13 – 2017-11-14 (×2): 5 mL via ORAL
  Filled 2017-11-13 (×2): qty 5

## 2017-11-13 MED ORDER — GABAPENTIN 400 MG PO CAPS
400.0000 mg | ORAL_CAPSULE | Freq: Every day | ORAL | Status: DC
  Administered 2017-11-13 – 2017-11-14 (×2): 400 mg via ORAL
  Filled 2017-11-13 (×2): qty 1

## 2017-11-13 MED ORDER — POTASSIUM PHOSPHATES 15 MMOLE/5ML IV SOLN
20.0000 mmol | Freq: Once | INTRAVENOUS | Status: AC
  Administered 2017-11-13: 20 mmol via INTRAVENOUS
  Filled 2017-11-13: qty 6.67

## 2017-11-13 MED ORDER — POTASSIUM CHLORIDE IN NACL 40-0.9 MEQ/L-% IV SOLN
INTRAVENOUS | Status: DC
  Administered 2017-11-13 – 2017-11-14 (×2): 100 mL/h via INTRAVENOUS

## 2017-11-13 MED ORDER — POTASSIUM CHLORIDE 10 MEQ/100ML IV SOLN
10.0000 meq | INTRAVENOUS | Status: DC
  Administered 2017-11-13: 10 meq via INTRAVENOUS
  Filled 2017-11-13: qty 100

## 2017-11-13 MED ORDER — ONDANSETRON HCL 4 MG/2ML IJ SOLN: 4.0000 mg | Freq: Four times a day (QID) | INTRAMUSCULAR | Status: DC | PRN

## 2017-11-13 MED ORDER — POTASSIUM CHLORIDE CRYS ER 20 MEQ PO TBCR
40.0000 meq | EXTENDED_RELEASE_TABLET | Freq: Once | ORAL | Status: AC
  Administered 2017-11-13: 40 meq via ORAL
  Filled 2017-11-13: qty 2

## 2017-11-13 MED ORDER — ALBUTEROL SULFATE (2.5 MG/3ML) 0.083% IN NEBU
2.5000 mg | INHALATION_SOLUTION | RESPIRATORY_TRACT | Status: DC | PRN
  Administered 2017-11-13 – 2017-11-14 (×2): 2.5 mg via RESPIRATORY_TRACT
  Filled 2017-11-13 (×2): qty 3

## 2017-11-13 MED ORDER — ALBUTEROL SULFATE HFA 108 (90 BASE) MCG/ACT IN AERS: 2.0000 | INHALATION_SPRAY | RESPIRATORY_TRACT | Status: DC | PRN

## 2017-11-13 MED ORDER — FLUTICASONE PROPIONATE 50 MCG/ACT NA SUSP
2.0000 | Freq: Two times a day (BID) | NASAL | Status: DC
  Administered 2017-11-13 – 2017-11-14 (×2): 2 via NASAL
  Filled 2017-11-13: qty 16

## 2017-11-13 MED ORDER — MECLIZINE HCL 12.5 MG PO TABS: 25.0000 mg | ORAL_TABLET | Freq: Three times a day (TID) | ORAL | Status: DC | PRN

## 2017-11-13 MED ORDER — HEPARIN SODIUM (PORCINE) 5000 UNIT/ML IJ SOLN
5000.0000 [IU] | Freq: Three times a day (TID) | INTRAMUSCULAR | Status: DC
  Administered 2017-11-13 – 2017-11-14 (×2): 5000 [IU] via SUBCUTANEOUS
  Filled 2017-11-13 (×2): qty 1

## 2017-11-13 MED ORDER — LEVOTHYROXINE SODIUM 100 MCG PO TABS
100.0000 ug | ORAL_TABLET | Freq: Every day | ORAL | Status: DC
  Administered 2017-11-14 – 2017-11-15 (×2): 100 ug via ORAL
  Filled 2017-11-13 (×2): qty 1

## 2017-11-13 MED ORDER — BENZONATATE 100 MG PO CAPS: 100.0000 mg | ORAL_CAPSULE | Freq: Three times a day (TID) | ORAL | Status: DC | PRN

## 2017-11-13 MED ORDER — POTASSIUM CHLORIDE CRYS ER 20 MEQ PO TBCR
40.0000 meq | EXTENDED_RELEASE_TABLET | ORAL | Status: AC
  Administered 2017-11-13 (×3): 40 meq via ORAL
  Filled 2017-11-13 (×4): qty 2

## 2017-11-13 MED ORDER — ONDANSETRON HCL 4 MG PO TABS: 4.0000 mg | ORAL_TABLET | Freq: Four times a day (QID) | ORAL | Status: DC | PRN

## 2017-11-13 MED ORDER — CLOPIDOGREL BISULFATE 75 MG PO TABS
75.0000 mg | ORAL_TABLET | Freq: Every evening | ORAL | Status: DC
  Administered 2017-11-13 – 2017-11-14 (×2): 75 mg via ORAL
  Filled 2017-11-13 (×3): qty 1

## 2017-11-13 NOTE — ED Triage Notes (Signed)
Pt reports was treated in er recently for low potassium.  Followed up with pcp yesterday and had blood redrawn.  Pt says was called this morning and was told it was low again and instructed pt to come to er. Pt c/o feeling tired.  Reports had some pain Sunday night in left shoulder blade but none since then.

## 2017-11-13 NOTE — ED Provider Notes (Signed)
[facility] EMERGENCY DEPARTMENT Provider Note   CSN: [id number] Arrival date & time: 11/13/17  8786     History   Chief Complaint Chief Complaint  Patient presents with  . low potassium    HPI Rachel [name] is a 67 y.o. female.  Generalized fatigue and malaise for approximately 1 month with associated sleepiness.  She was seen by her primary care doctor this week and noted to have a low hemoglobin and potassium.  She was sent to the ED for further evaluation.  No black stool, chest pain, dyspnea, fever, sweats, chills, dysuria..  Past medical history includes hypertension, diabetes, CAD, hyperlipidemia, several others.     Past Medical History:  Diagnosis Date  . Asthma   . Coronary atherosclerosis of native coronary artery    DES x 2 (overlapping) RCA 03/2008 - patent 2011  . Diabetes type 2, controlled ([place]) 2000  . Essential hypertension, benign   . Fatty liver   . GERD (gastroesophageal reflux disease)   . Hyperlipidemia   . Microalbuminuria   . Obesity   . Odynophagia    Severe esophagitis on CT 05/2010, treated empirically with Diflucan  . PAD (peripheral artery disease) (HCC)    history of left greater than right lower 70 claudication  . Tubular adenoma of colon 07/2007   Multiple colonic polyps  . Venous stasis     Patient Active Problem List   Diagnosis Date Noted  . Allergic rhinitis 06/26/2017  . Carotid artery disease ([place]) 06/20/2017  . COPD (chronic obstructive pulmonary disease) ([place]) 05/10/2017  . Vitamin D deficiency 02/15/2017  . Personal history of noncompliance with medical treatment, presenting hazards to health 03/30/2016  . Acute cholecystitis 07/23/2015  . Pseudoaneurysm of left femoral artery ([place]) 10/17/2014  . Claudication ([place]) 10/15/2014  . Hypokalemia 08/28/2014  . AKI (acute kidney injury) ([place]) 08/28/2014  . Diarrhea 08/28/2014  . High anion gap metabolic acidosis 76/72/0947  . Hypothyroidism 02/07/2014  . Essential  hypertension, benign 10/13/2013  . Peripheral arterial disease ([place]) 05/22/2013  . Meniere's disease 04/07/2013  . DM type 2 causing vascular disease ([place]) 03/11/2009  . Mixed hyperlipidemia 03/11/2009  . TOBACCO ABUSE 03/11/2009  . Coronary atherosclerosis of native coronary artery 03/11/2009    Past Surgical History:  Procedure Laterality Date  . CARDIAC CATHETERIZATION     stents  . CHOLECYSTECTOMY N/A 07/30/2015   Procedure: LAPAROSCOPIC CHOLECYSTECTOMY;  Surgeon: Aviva Signs, MD;  Location: [facility];  Service: General;  Laterality: N/A;  . COLONOSCOPY W/ POLYPECTOMY  07/2007   Tubular adenoma  . CORONARY STENT PLACEMENT    . CYSTECTOMY     Pilonidal  . ESOPHAGOGASTRODUODENOSCOPY  04/2010   RMR: Normal esophagus , Stomach D1 and D2   . LOWER EXTREMITY ANGIOGRAPHY N/A 03/13/2016   Procedure: Lower Extremity Angiography;  Surgeon: [name], MD;  Location: [place] [facility];  Service: Cardiovascular;  Laterality: N/A;  . PERIPHERAL VASCULAR ATHERECTOMY  03/27/2016   Procedure: Peripheral Vascular Atherectomy;  Surgeon: [name], MD;  Location: [place] [facility];  Service: Cardiovascular;;  Attempted atherectomy  . PERIPHERAL VASCULAR CATHETERIZATION Bilateral 09/28/2014   Procedure: Lower Extremity Angiography;  Surgeon: [name], MD;  Location: [place] [facility];  Service: Cardiovascular;  Laterality: Bilateral;  . PERIPHERAL VASCULAR CATHETERIZATION N/A 09/28/2014   Procedure: Abdominal Aortogram;  Surgeon: [name], MD;  Location: [place] [facility];  Service: Cardiovascular;  Laterality: N/A;  . PERIPHERAL VASCULAR CATHETERIZATION N/A 10/15/2014  Procedure: Lower Extremity Angiography;  Surgeon: [name], MD;  Location: [place] [facility];  Service: Cardiovascular;  Laterality: N/A;  . PERIPHERAL VASCULAR CATHETERIZATION  10/15/2014   Procedure: Peripheral Vascular Intervention;  Surgeon: [name], MD;  Location: [place] [facility];   Service: Cardiovascular;;  bilateral iliac stents  . PILONIDAL CYST / SINUS EXCISION    . TUBAL LIGATION Bilateral      OB History   None      Home Medications    Prior to Admission medications   Medication Sig Start Date End Date Taking? Authorizing Provider  albuterol (PROAIR HFA) 108 (90 Base) MCG/ACT inhaler Inhale 2 puffs into the lungs every 4 (four) hours as needed for wheezing or shortness of breath. 05/10/17  Yes Collene Gobble, MD  aspirin EC 81 MG tablet Take 81 mg by mouth daily.     Yes [provider]  clopidogrel (PLAVIX) 75 MG tablet Take 1 tablet (75 mg total) by mouth daily. 01/05/17  Yes [name], MD  fluticasone (FLONASE) 50 MCG/ACT nasal spray Place 2 sprays into both nostrils daily. Patient taking differently: Place 2 sprays into both nostrils 2 (two) times daily.  06/26/17  Yes Collene Gobble, MD  gabapentin (NEURONTIN) 400 MG capsule TAKE ONE CAPSULE BY MOUTH AT BEDTIME AS DIRECTED Patient taking differently: Take 400 mg by mouth at bedtime.  07/26/17  Yes Mikey Kirschner, MD  Insulin Glargine (LANTUS) 100 UNIT/ML Solostar Pen Inject 40 Units into the skin daily. Patient taking differently: Inject 40 Units into the skin at bedtime.  08/24/17  Yes Nida, Marella Chimes, MD  levothyroxine (SYNTHROID, LEVOTHROID) 100 MCG tablet TAKE 1 TABLET BY MOUTH EVERY DAY BEFORE BREAKFAST Patient taking differently: Take 100 mcg by mouth daily before breakfast.  09/19/17  Yes Nida, Marella Chimes, MD  NOVOLOG FLEXPEN 100 UNIT/ML FlexPen INJECT 14-20 UNITS 3 TIMES DAILY WITH MEALS INTO THE SKIN Patient taking differently: Inject 14-20 Units into the skin 3 (three) times daily with meals.  07/10/17  Yes Cassandria Anger, MD  pantoprazole (PROTONIX) 40 MG tablet Take 1 tablet (40 mg total) by mouth daily. 07/26/17  Yes Mikey Kirschner, MD  potassium chloride SA (K-DUR,KLOR-CON) 20 MEQ tablet Take 1 tablet (20 mEq total) by mouth 2 (two) times daily. 11/06/17   Yes Daleen Bo, MD  rosuvastatin (CRESTOR) 40 MG tablet TAKE 1 TABLET EVERY DAY AT BEDTIME Patient taking differently: Take 40 mg by mouth every evening.  07/10/17  Yes [name], MD  Tiotropium Bromide Monohydrate (SPIRIVA RESPIMAT) 2.5 MCG/ACT AERS Inhale 2 puffs into the lungs daily. 06/14/17  Yes Collene Gobble, MD  Vitamin D, Ergocalciferol, (DRISDOL) 50000 units CAPS capsule TAKE 1 CAPSULE (50,000 UNITS TOTAL) BY MOUTH EVERY 7 (SEVEN) DAYS. 09/12/17  Yes Nida, Marella Chimes, MD  meclizine (ANTIVERT) 25 MG tablet Take 1 tablet (25 mg total) by mouth 3 (three) times daily as needed for dizziness. Patient not taking: Reported on 11/13/2017 05/23/17   Mikey Kirschner, MD  oxybutynin (DITROPAN) 5 MG tablet Take 1 tablet (5 mg total) by mouth 2 (two) times daily. Patient not taking: Reported on 08/04/2017 07/26/17   Mikey Kirschner, MD    Family History Family History  Problem Relation Age of Onset  . Bone cancer Father   . Heart disease Maternal Grandmother   . Hypertension Mother   . Colon cancer Neg Hx   . Liver disease Neg Hx   .  Inflammatory bowel disease Neg Hx     Social History Social History   Tobacco Use  . Smoking status: Current Every Day Smoker    Packs/day: 0.25    Years: 50.00    Pack years: 12.50    Types: Cigarettes  . Smokeless tobacco: Never Used  Substance Use Topics  . Alcohol use: No  . Drug use: No     Allergies   Altace [ramipril]; Biaxin [clarithromycin]; Niacin and related; Nsaids; Tramadol; and Adhesive [tape]   Review of Systems Review of Systems  All other systems reviewed and are negative.    Physical Exam Updated Vital Signs BP 109/85 (BP Location: Left Arm)   Pulse 66   Temp 98.1 F (36.7 C) (Oral)   Resp 16   Ht 5\' 3"  (1.6 m)   Wt 67.7 kg   SpO2 100%   BMI 26.44 kg/m   Physical Exam  Constitutional: She is oriented to person, place, and time. She appears well-developed and well-nourished.  nad  HENT:  Head:  Normocephalic and atraumatic.  Eyes: Conjunctivae are normal.  Neck: Neck supple.  Cardiovascular: Normal rate and regular rhythm.  Pulmonary/Chest: Effort normal and breath sounds normal.  Abdominal: Soft. Bowel sounds are normal.  Genitourinary:  Genitourinary Comments: Rectal exam: No masses, dark brown stool, heme-negative.  Musculoskeletal: Normal range of motion.  Neurological: She is alert and oriented to person, place, and time.  Skin: Skin is warm and dry.  Psychiatric: She has a normal mood and affect. Her behavior is normal.  Nursing note and vitals reviewed.    ED Treatments / Results  Labs (all labs ordered are listed, but only abnormal results are displayed) Labs Reviewed  CBC WITH DIFFERENTIAL/PLATELET - Abnormal; Notable for the following components:      Result Value   RBC 3.38 (*)    Hemoglobin 7.5 (*)    HCT 26.5 (*)    MCV 78.4 (*)    MCH 22.2 (*)    MCHC 28.3 (*)    RDW 20.8 (*)    All other components within normal limits  COMPREHENSIVE METABOLIC PANEL - Abnormal; Notable for the following components:   Potassium <2.0 (*)    Chloride 94 (*)    CO2 33 (*)    Glucose, Bld 236 (*)    BUN 7 (*)    Calcium 8.6 (*)    Albumin 2.9 (*)    All other components within normal limits  TSH - Abnormal; Notable for the following components:   TSH 0.298 (*)    All other components within normal limits  MAGNESIUM  URINALYSIS, ROUTINE W REFLEX MICROSCOPIC  POC OCCULT BLOOD, ED    EKG EKG Interpretation  Date/Time:  Tuesday November 13 2017 08:53:00 EDT Ventricular Rate:  92 PR Interval:    QRS Duration: 87 QT Interval:  395 QTC Calculation: 489 R Axis:   46 Text Interpretation:  Sinus rhythm Borderline T abnormalities, diffuse leads Borderline prolonged QT interval Baseline wander in lead(s) V3 V6 Confirmed by Nat Christen [phone]) on 11/13/2017 10:18:48 AM   Radiology No results found.  Procedures Procedures (including critical care  time)  Medications Ordered in ED Medications  potassium chloride SA (K-DUR,KLOR-CON) CR tablet 40 mEq (has no administration in time range)  potassium chloride 10 mEq in 100 mL IVPB (has no administration in time range)     Initial Impression / Assessment and Plan / ED Course  I have reviewed the triage vital signs and the nursing notes.  Pertinent labs & imaging results that were available during my care of the patient were reviewed by me and considered in my medical decision making (see chart for details).     Patient presents with weakness and malaise.  Hemoglobin 7.5 and potassium less than 2.  Rectal exam negative for blood.  Will replace potassium orally and intravenously.  Admit to general medicine.   CRITICAL CARE Performed by: Nat Christen Total critical care time: 30 minutes Critical care time was exclusive of separately billable procedures and treating other patients. Critical care was necessary to treat or prevent imminent or life-threatening deterioration. Critical care was time spent personally by me on the following activities: development of treatment plan with patient and/or surrogate as well as nursing, discussions with consultants, evaluation of patient's response to treatment, examination of patient, obtaining history from patient or surrogate, ordering and performing treatments and interventions, ordering and review of laboratory studies, ordering and review of radiographic studies, pulse oximetry and re-evaluation of patient's condition.  Final Clinical Impressions(s) / ED Diagnoses   Final diagnoses:  Hypokalemia  Anemia, unspecified type    ED Discharge Orders    None       Nat Christen, MD 11/13/17 1055

## 2017-11-13 NOTE — ED Notes (Signed)
CRITICAL VALUE ALERT  Critical Value:  K less than 2  Date & Time Notied:  11/13/2017, 7944  Provider Notified: Dr. Lacinda Axon  Orders Received/Actions taken: see chart

## 2017-11-13 NOTE — ED Notes (Signed)
Pt reports she cannot take PO Potassium without applesauce. No applesauce available in ED at this time. RN attempting to get applesauce from cafeteria.

## 2017-11-13 NOTE — H&P (Signed)
History and Physical    [name] HCW:[id number] DOB: Jun 24, 1950 DOA: 11/13/2017  Referring MD/NP/PA: Dr. Lacinda [name] PCP: Rachel Kirschner, MD  Patient coming from: Home  Chief Complaint: Generalized weakness and fatigue; abnormal blood work.  HPI: Rachel [name] is a 67 y.o. female with past medical history significant for asthma/COPD, hypertension, type 2 diabetes mellitus, history of coronary artery disease (status post drug-eluting stent x2 on her RCA in February 2010), gastroesophageal reflux disease and peripheral arterial disease; who presented to the hospital secondary to generalized weakness and fatigue that has been ongoing for over a month now.  Patient was seen by her PCP who found patient with anemia (hemoglobin of 8.2) and also severe hypokalemia with a potassium less than 2; patient was contacted and advised to come to the emergency department for further evaluation and management..  Patient reports some intermittent episodes of loose stools and also expressed having some difficulty controlling her blood sugars at home.  She denies shortness of breath, chest pain, palpitations, fever or chills; no dysuria, no hematuria, no abdominal pain, no nausea/vomiting; denies any melena, hematochezia or hematemesis.  There is no focal weakness, headaches, blurred vision or any other complaints.  In the ED patient EKG and telemetry monitoring demonstrated no acute abnormalities, patient blood work with a potassium less than 2 and hyperglycemia (CBGs in the 240 range); patient hemoglobin was 7.5.  Fecal occult blood test was negative and patient was initiated on IV and oral potassium.  TR[facility] has been contacted to place patient in observation for further evaluation and management.  Past Medical/Surgical History: Past Medical History:  Diagnosis Date  . Asthma   . Coronary atherosclerosis of native coronary artery    DES x 2 (overlapping) RCA 03/2008 - patent 2011  . Diabetes type 2,  controlled (Rachel [name]) 2000  . Essential hypertension, benign   . Fatty liver   . GERD (gastroesophageal reflux disease)   . Hyperlipidemia   . Microalbuminuria   . Obesity   . Odynophagia    Severe esophagitis on CT 05/2010, treated empirically with Diflucan  . PAD (peripheral artery disease) (HCC)    history of left greater than right lower 70 claudication  . Tubular adenoma of colon 07/2007   Multiple colonic polyps  . Venous stasis     Past Surgical History:  Procedure Laterality Date  . CARDIAC CATHETERIZATION     stents  . CHOLECYSTECTOMY N/A 07/30/2015   Procedure: LAPAROSCOPIC CHOLECYSTECTOMY;  Surgeon: Rachel Signs, MD;  Location: [facility];  Service: General;  Laterality: N/A;  . COLONOSCOPY W/ POLYPECTOMY  07/2007   Tubular adenoma  . CORONARY STENT PLACEMENT    . CYSTECTOMY     Pilonidal  . ESOPHAGOGASTRODUODENOSCOPY  04/2010   RMR: Normal esophagus , Stomach D1 and D2   . LOWER EXTREMITY ANGIOGRAPHY N/A 03/13/2016   Procedure: Lower Extremity Angiography;  Surgeon: Rachel Harp, MD;  Location: [place] [facility];  Service: Cardiovascular;  Laterality: N/A;  . PERIPHERAL VASCULAR ATHERECTOMY  03/27/2016   Procedure: Peripheral Vascular Atherectomy;  Surgeon: Rachel Harp, MD;  Location: [place] [facility];  Service: Cardiovascular;;  Attempted atherectomy  . PERIPHERAL VASCULAR CATHETERIZATION Bilateral 09/28/2014   Procedure: Lower Extremity Angiography;  Surgeon: Rachel Harp, MD;  Location: [place] [facility];  Service: Cardiovascular;  Laterality: Bilateral;  . PERIPHERAL VASCULAR CATHETERIZATION N/A 09/28/2014   Procedure: Abdominal Aortogram;  Surgeon: Rachel Harp, MD;  Location: [place] [facility];  Service: Cardiovascular;  Laterality: N/A;  . PERIPHERAL VASCULAR CATHETERIZATION N/A 10/15/2014   Procedure: Lower Extremity Angiography;  Surgeon: Rachel Harp, MD;  Location: [place] [facility];  Service: Cardiovascular;  Laterality: N/A;  . PERIPHERAL VASCULAR  CATHETERIZATION  10/15/2014   Procedure: Peripheral Vascular Intervention;  Surgeon: Rachel Harp, MD;  Location: [place] [facility];  Service: Cardiovascular;;  bilateral iliac stents  . PILONIDAL CYST / SINUS EXCISION    . TUBAL LIGATION Bilateral     Social History:  reports that she has been smoking cigarettes. She has a 12.50 pack-year smoking history. She has never used smokeless tobacco. She reports that she does not drink alcohol or use drugs.  Allergies: Allergies  Allergen Reactions  . Altace [Ramipril] Cough  . Biaxin [Clarithromycin]     Fatigue  . Niacin And Related Other (See Comments)    Flush - felt like on fire.  . Nsaids Other (See Comments)    feverish  . Tramadol     Lethargic   . Adhesive [Tape] Rash    Paper tape is ok    Family History:  Family History  Problem Relation Age of Onset  . Bone cancer Father   . Heart disease Maternal Grandmother   . Hypertension Mother   . Colon cancer Neg Hx   . Liver disease Neg Hx   . Inflammatory bowel disease Neg Hx     Prior to Admission medications   Medication Sig Start Date End Date Taking? Authorizing Provider  albuterol (PROAIR HFA) 108 (90 Base) MCG/ACT inhaler Inhale 2 puffs into the lungs every 4 (four) hours as needed for wheezing or shortness of breath. 05/10/17  Yes Rachel Gobble, MD  aspirin EC 81 MG tablet Take 81 mg by mouth daily.     Yes [provider]  clopidogrel (PLAVIX) 75 MG tablet Take 1 tablet (75 mg total) by mouth daily. 01/05/17  Yes Rachel Harp, MD  fluticasone (FLONASE) 50 MCG/ACT nasal spray Place 2 sprays into both nostrils daily. Patient taking differently: Place 2 sprays into both nostrils 2 (two) times daily.  06/26/17  Yes Rachel Gobble, MD  gabapentin (NEURONTIN) 400 MG capsule TAKE ONE CAPSULE BY MOUTH AT BEDTIME AS DIRECTED Patient taking differently: Take 400 mg by mouth at bedtime.  07/26/17  Yes Rachel Kirschner, MD  Insulin Glargine (LANTUS) 100 UNIT/ML  Solostar Pen Inject 40 Units into the skin daily. Patient taking differently: Inject 40 Units into the skin at bedtime.  08/24/17  Yes Rachel [name], Rachel Chimes, MD  levothyroxine (SYNTHROID, LEVOTHROID) 100 MCG tablet TAKE 1 TABLET BY MOUTH EVERY DAY BEFORE BREAKFAST Patient taking differently: Take 100 mcg by mouth daily before breakfast.  09/19/17  Yes Rachel [name], Rachel Chimes, MD  NOVOLOG FLEXPEN 100 UNIT/ML FlexPen INJECT 14-20 UNITS 3 TIMES DAILY WITH MEALS INTO THE SKIN Patient taking differently: Inject 14-20 Units into the skin 3 (three) times daily with meals.  07/10/17  Yes Cassandria Anger, MD  pantoprazole (PROTONIX) 40 MG tablet Take 1 tablet (40 mg total) by mouth daily. 07/26/17  Yes Rachel Kirschner, MD  potassium chloride SA (K-DUR,KLOR-CON) 20 MEQ tablet Take 1 tablet (20 mEq total) by mouth 2 (two) times daily. 11/06/17  Yes Daleen Bo, MD  rosuvastatin (CRESTOR) 40 MG tablet TAKE 1 TABLET EVERY DAY AT BEDTIME Patient taking differently: Take 40 mg by mouth every evening.  07/10/17  Yes Rachel Harp, MD  Tiotropium Bromide Monohydrate (SPIRIVA RESPIMAT) 2.5 MCG/ACT AERS  Inhale 2 puffs into the lungs daily. 06/14/17  Yes Rachel Gobble, MD  Vitamin D, Ergocalciferol, (DRISDOL) 50000 units CAPS capsule TAKE 1 CAPSULE (50,000 UNITS TOTAL) BY MOUTH EVERY 7 (SEVEN) DAYS. 09/12/17  Yes Rachel [name], Rachel Chimes, MD  meclizine (ANTIVERT) 25 MG tablet Take 1 tablet (25 mg total) by mouth 3 (three) times daily as needed for dizziness. Patient not taking: Reported on 11/13/2017 05/23/17   Rachel Kirschner, MD    Review of Systems:  Negative except as otherwise mentioned in HPI.  Physical Exam: Vitals:   11/13/17 1000 11/13/17 1030 11/13/17 1109 11/13/17 1130  BP:   (!) 97/56 (!) 95/50  Pulse:   83 85  Resp: (!) 23 18 14 15   Temp:      TempSrc:      SpO2:   100% 94%  Weight:      Height:       Constitutional: NAD, calm, comfortable; complaining of some intermittent dry coughing  spells, no chest pain, no shortness of breath, no nausea, no vomiting, no abdominal pain. Eyes: PERRL, lids and conjunctivae normal, no icterus, no nystagmus. ENMT: Mucous membranes are moist. Posterior pharynx clear of any exudate or lesions. Normal dentition.  Neck: normal, supple, no masses, no thyromegaly, no JVD Respiratory: clear to auscultation bilaterally, no wheezing, no crackles. Normal respiratory effort. No accessory muscle use.  Cardiovascular: Regular rate and rhythm, no murmurs / rubs / gallops. No extremity edema. 2+ pedal pulses. No carotid bruits.  Abdomen: no tenderness, no masses palpated. No hepatosplenomegaly. Bowel sounds positive.  Musculoskeletal: no clubbing / cyanosis. No joint deformity upper and lower extremities. Good ROM, no contractures. Normal muscle tone.  Skin: no rashes, lesions, ulcers. No induration Neurologic: CN 2-12 grossly intact. Sensation intact, DTR normal. Strength 5/5 in all 4.  Psychiatric: Normal judgment and insight. Alert and oriented x 3. Normal mood.    Labs on Admission: I have personally reviewed the following labs and imaging studies  CBC: Recent Labs  Lab 11/06/17 1245 11/12/17 1412 11/13/17 0923  WBC 10.4 10.8 7.5  NEUTROABS 8.0* 8.0* 5.7  HGB 8.5* 8.2* 7.5*  HCT 30.1* 28.2* 26.5*  MCV 76.6* 76* 78.4*  PLT 313 343 361   Basic Metabolic Panel: Recent Labs  Lab 11/06/17 1245 11/12/17 1412 11/13/17 0923  NA 135 138 137  K <2.0* 2.4* <2.0*  CL 83* 90* 94*  CO2 37* 31* 33*  GLUCOSE 299* 243* 236*  BUN 12 10 7*  CREATININE 1.65* 1.03* 0.96  CALCIUM 8.6* 9.0 8.6*  MG  --   --  2.0   GFR: Estimated Creatinine Clearance: 54 mL/min (by C-G formula based on SCr of 0.96 mg/dL).   Liver Function Tests: Recent Labs  Lab 11/13/17 0923  AST 17  ALT 9  ALKPHOS 78  BILITOT 0.6  PROT 6.5  ALBUMIN 2.9*   CBG: Recent Labs  Lab 11/06/17 1526  GLUCAP 310*   Thyroid Function Tests: Recent Labs    11/13/17 0923  TSH  0.298*    Urine analysis:    Component Value Date/Time   COLORURINE YELLOW 07/23/2015 0610   APPEARANCEUR CLEAR 07/23/2015 0610   LABSPEC 1.010 07/23/2015 0610   PHURINE 6.0 07/23/2015 0610   GLUCOSEU >1000 (A) 07/23/2015 0610   HGBUR NEGATIVE 07/23/2015 0610   BILIRUBINUR NEGATIVE 07/23/2015 0610   KETONESUR NEGATIVE 07/23/2015 0610   PROTEINUR 30 (A) 07/23/2015 0610   NITRITE NEGATIVE 07/23/2015 0610   LEUKOCYTESUR NEGATIVE 07/23/2015 0610  Radiological Exams on Admission: No results found.  EKG: Independently reviewed.  No acute ischemic changes, no abnormalities appreciated on her T waves or QT.  Assessment/Plan 1-hypokalemia: Severe with a potassium less than 2 on admission -Patient has been placed on telemetry -IV and oral potassium repletion has been started -Will check TSH, Phosphorus and cortisol level -HCTZ has been discontinue -follow electrolytes trend  -Mg WNL -repeat BMET in am  2-DM type 2 causing vascular disease ([place]) -hold oral hypoglycemic regimen while inpatient -check A1C -use Lantus and SSI  3-Mixed hyperlipidemia -Heart healthy diet has been ordered -Patient no using any statins as an outpatient.  4-TOBACCO ABUSE -Cessation counseling provided -Patient declines nicotine patch.  5-GERD (gastroesophageal reflux disease) -Continue PPI  6-Essential hypertension, benign -Blood pressure soft but stable -HCTZ has been discontinued and currently no requiring antihypertensive agents -Follow vital [name] and adjust treatment as needed.  7-Hypothyroidism -Continue Synthroid  8-generalized weakness -In the setting of electrolyte abnormalities and anemia -Replete electrolytes and symptoms persist will transfuse 2 units of PRBCs. -Provide supportive care and follow clinical response.  9-asthma/COPD -Currently no wheezing -Patient encouraged to quit smoking -Continue as needed albuterol, Spiriva and the use of Flonase.  10-anemia: -Unclear  etiology -No history suggesting blood loss -Fecal occult blood test negative -Will check anemia panel, type and screen and follow hemoglobin trend. -repeat CBC in AM; if hemoglobin less than 7.5 given history of coronary artery disease will recommend for the patient to receive transfusion prior to discharge. -Further outpatient anemia work-up to be pursuit   DVT prophylaxis: Heparin Code Status: Full code Family Communication: Daughter at bedside Disposition Plan: Anticipate discharge back home once electrolytes are repleted and stable. Consults called: None Admission status: Telemetry, length of stay less than 2 midnights, observation status.   Time Spent: 70 minutes  Barton Dubois MD [facility] Pager [phone]  If 7PM-7AM, please contact night-coverage [email or web address] Password [email or web address]  11/13/2017, 11:44 AM

## 2017-11-13 NOTE — Care Management Obs Status (Signed)
[place] NOTIFICATION   Patient Details  Name: Rachel [name] MRN: [id number] Date of Birth: May 26, 1950   Medicare Observation Status Notification Given:  Yes    Sherald Barge, RN 11/13/2017, 3:00 PM

## 2017-11-14 DIAGNOSIS — E876 Hypokalemia: Principal | ICD-10-CM

## 2017-11-14 DIAGNOSIS — Z8249 Family history of ischemic heart disease and other diseases of the circulatory system: Secondary | ICD-10-CM | POA: Diagnosis not present

## 2017-11-14 DIAGNOSIS — F17210 Nicotine dependence, cigarettes, uncomplicated: Secondary | ICD-10-CM | POA: Diagnosis present

## 2017-11-14 DIAGNOSIS — K219 Gastro-esophageal reflux disease without esophagitis: Secondary | ICD-10-CM | POA: Diagnosis present

## 2017-11-14 DIAGNOSIS — Z885 Allergy status to narcotic agent status: Secondary | ICD-10-CM | POA: Diagnosis not present

## 2017-11-14 DIAGNOSIS — I878 Other specified disorders of veins: Secondary | ICD-10-CM | POA: Diagnosis present

## 2017-11-14 DIAGNOSIS — Z886 Allergy status to analgesic agent status: Secondary | ICD-10-CM | POA: Diagnosis not present

## 2017-11-14 DIAGNOSIS — J449 Chronic obstructive pulmonary disease, unspecified: Secondary | ICD-10-CM | POA: Diagnosis present

## 2017-11-14 DIAGNOSIS — I10 Essential (primary) hypertension: Secondary | ICD-10-CM | POA: Diagnosis present

## 2017-11-14 DIAGNOSIS — D509 Iron deficiency anemia, unspecified: Secondary | ICD-10-CM | POA: Diagnosis present

## 2017-11-14 DIAGNOSIS — Z881 Allergy status to other antibiotic agents status: Secondary | ICD-10-CM | POA: Diagnosis not present

## 2017-11-14 DIAGNOSIS — E039 Hypothyroidism, unspecified: Secondary | ICD-10-CM | POA: Diagnosis present

## 2017-11-14 DIAGNOSIS — E1165 Type 2 diabetes mellitus with hyperglycemia: Secondary | ICD-10-CM | POA: Diagnosis present

## 2017-11-14 DIAGNOSIS — Z91048 Other nonmedicinal substance allergy status: Secondary | ICD-10-CM | POA: Diagnosis not present

## 2017-11-14 DIAGNOSIS — Z9049 Acquired absence of other specified parts of digestive tract: Secondary | ICD-10-CM | POA: Diagnosis not present

## 2017-11-14 DIAGNOSIS — K760 Fatty (change of) liver, not elsewhere classified: Secondary | ICD-10-CM | POA: Diagnosis present

## 2017-11-14 DIAGNOSIS — I739 Peripheral vascular disease, unspecified: Secondary | ICD-10-CM | POA: Diagnosis present

## 2017-11-14 DIAGNOSIS — E782 Mixed hyperlipidemia: Secondary | ICD-10-CM | POA: Diagnosis present

## 2017-11-14 DIAGNOSIS — Z9851 Tubal ligation status: Secondary | ICD-10-CM | POA: Diagnosis not present

## 2017-11-14 DIAGNOSIS — E1159 Type 2 diabetes mellitus with other circulatory complications: Secondary | ICD-10-CM | POA: Diagnosis present

## 2017-11-14 DIAGNOSIS — Z955 Presence of coronary angioplasty implant and graft: Secondary | ICD-10-CM | POA: Diagnosis not present

## 2017-11-14 DIAGNOSIS — Z8719 Personal history of other diseases of the digestive system: Secondary | ICD-10-CM | POA: Diagnosis not present

## 2017-11-14 DIAGNOSIS — I2510 Atherosclerotic heart disease of native coronary artery without angina pectoris: Secondary | ICD-10-CM | POA: Diagnosis present

## 2017-11-14 DIAGNOSIS — Z809 Family history of malignant neoplasm, unspecified: Secondary | ICD-10-CM | POA: Diagnosis not present

## 2017-11-14 LAB — GLUCOSE, CAPILLARY
GLUCOSE-CAPILLARY: 168 mg/dL — AB (ref 70–99)
Glucose-Capillary: 153 mg/dL — ABNORMAL HIGH (ref 70–99)
Glucose-Capillary: 185 mg/dL — ABNORMAL HIGH (ref 70–99)
Glucose-Capillary: 242 mg/dL — ABNORMAL HIGH (ref 70–99)

## 2017-11-14 LAB — CBC
HCT: 23.7 % — ABNORMAL LOW (ref 36.0–46.0)
Hemoglobin: 6.3 g/dL — CL (ref 12.0–15.0)
MCH: 21.1 pg — AB (ref 26.0–34.0)
MCHC: 26.6 g/dL — ABNORMAL LOW (ref 30.0–36.0)
MCV: 79.5 fL — AB (ref 80.0–100.0)
PLATELETS: 236 10*3/uL (ref 150–400)
RBC: 2.98 MIL/uL — AB (ref 3.87–5.11)
RDW: 21 % — ABNORMAL HIGH (ref 11.5–15.5)
WBC: 6.5 10*3/uL (ref 4.0–10.5)
nRBC: 0 % (ref 0.0–0.2)

## 2017-11-14 LAB — BASIC METABOLIC PANEL
ANION GAP: 9 (ref 5–15)
BUN: 9 mg/dL (ref 8–23)
CO2: 31 mmol/L (ref 22–32)
Calcium: 8.3 mg/dL — ABNORMAL LOW (ref 8.9–10.3)
Chloride: 100 mmol/L (ref 98–111)
Creatinine, Ser: 0.98 mg/dL (ref 0.44–1.00)
GFR calc Af Amer: >60 mL/min (ref >60)
GFR calc non Af Amer: 59 mL/min — ABNORMAL LOW (ref >60)
GLUCOSE: 199 mg/dL — AB (ref 70–99)
POTASSIUM: 3.6 mmol/L (ref 3.5–5.1)
Sodium: 140 mmol/L (ref 135–145)

## 2017-11-14 LAB — CORTISOL: Cortisol, Plasma: 5.6 ug/dL

## 2017-11-14 LAB — HIV ANTIBODY (ROUTINE TESTING W REFLEX): HIV Screen 4th Generation wRfx: NONREACTIVE

## 2017-11-14 LAB — PREPARE RBC (CROSSMATCH)

## 2017-11-14 LAB — MAGNESIUM: Magnesium: 1.9 mg/dL (ref 1.7–2.4)

## 2017-11-14 MED ORDER — ACETAMINOPHEN 325 MG PO TABS
ORAL_TABLET | ORAL | Status: AC
  Filled 2017-11-14: qty 2

## 2017-11-14 MED ORDER — DM-GUAIFENESIN ER 30-600 MG PO TB12
1.0000 | ORAL_TABLET | Freq: Two times a day (BID) | ORAL | Status: DC
  Administered 2017-11-14 – 2017-11-15 (×2): 1 via ORAL
  Filled 2017-11-14 (×2): qty 1

## 2017-11-14 MED ORDER — SODIUM CHLORIDE 0.9 % IV SOLN
510.0000 mg | Freq: Once | INTRAVENOUS | Status: AC
  Administered 2017-11-14: 510 mg via INTRAVENOUS
  Filled 2017-11-14: qty 17

## 2017-11-14 MED ORDER — METHOCARBAMOL 1000 MG/10ML IJ SOLN
INTRAMUSCULAR | Status: AC
  Filled 2017-11-14: qty 10

## 2017-11-14 MED ORDER — ACETAMINOPHEN 325 MG PO TABS
650.0000 mg | ORAL_TABLET | Freq: Four times a day (QID) | ORAL | Status: DC | PRN
  Administered 2017-11-14: 650 mg via ORAL

## 2017-11-14 MED ORDER — SODIUM CHLORIDE 0.9% IV SOLUTION: Freq: Once | INTRAVENOUS | Status: DC

## 2017-11-14 MED ORDER — METHOCARBAMOL 1000 MG/10ML IJ SOLN
750.0000 mg | Freq: Once | INTRAVENOUS | Status: AC
  Administered 2017-11-14: 750 mg via INTRAVENOUS
  Filled 2017-11-14: qty 7.5

## 2017-11-14 NOTE — Progress Notes (Signed)
PROGRESS NOTE    Rachel [name]  HGD:[id number] DOB: 11-Aug-1950 DOA: 11/13/2017 PCP: Mikey Kirschner, MD   Brief Narrative:  Per HPI from Dr. Dyann Kief 10/8: Rachel [name] is a 67 y.o. female with past medical history significant for asthma/COPD, hypertension, type 2 diabetes mellitus, history of coronary artery disease (status post drug-eluting stent x2 on her RCA in February 2010), gastroesophageal reflux disease and peripheral arterial disease; who presented to the hospital secondary to generalized weakness and fatigue that has been ongoing for over a month now.  Patient was seen by her PCP who found patient with anemia (hemoglobin of 8.2) and also severe hypokalemia with a potassium less than 2; patient was contacted and advised to come to the emergency department for further evaluation and management.  Her stool occult blood have been noted to be negative, but her anemia panel demonstrated some severe iron deficiency anemia.  Assessment & Plan:   Principal Problem:   Hypokalemia Active Problems:   DM type 2 causing vascular disease (HCC)   Mixed hyperlipidemia   TOBACCO ABUSE   GERD (gastroesophageal reflux disease)   Essential hypertension, benign   Hypothyroidism   Generalized weakness   1. Generalized weakness-multifactorial secondary to hypokalemia and severe anemia.  Continue to monitor for improvement and anticipate discharge once symptoms improved. 2. Severe iron deficiency anemia.  No sign of GI bleeding currently noted.  We will plan for 2 unit PRBC transfusion today as well as IV iron supplementation.  Repeat CBC in a.m.  Will consider repeat FOBT if H/H does not demonstrate improvement.  Patient did have colonoscopy 7 to 8 years ago. 3. Hypokalemia-improved.  HCTZ has been withheld and could be one potential cause.  Patient was recently started on oral potassium supplementation at home.  Magnesium within normal limits.  Recheck labs in a.m.  DC IV fluid  today. 4. Type 2 diabetes with vascular disease.  Continue Lantus and SSI.  Hemoglobin A1c is reasonable at 8%. 5. Mixed hyperlipidemia.  Continue healthy diet.  Patient not currently on any statins.  Monitor in the outpatient setting. 6. GERD.  Continue PPI.  May be one cause of iron deficiency due to poor absorption.  Will require iron supplementation in the outpatient setting. 7. Hypothyroidism.  Continue Synthroid.  TSH noted to be within normal limits. 8. Essential hypertension.  Blood pressure remains stable.  Hold HCTZ as well as other hypertensive agents.  Follow vital signs. 9. Asthma/COPD.  Currently with no acute bronchospasms.  Continue as needed breathing treatments and use Spiriva and Flonase. 10. Tobacco abuse.  Cessation counseling was provided and patient declined nicotine patch.   DVT prophylaxis: Heparin changed to SCDs Code Status: Full Family Communication: Daughter at bedside Disposition Plan: Anticipate discharge in next 24 hours if H/H remained stable after blood transfusion as well as hypokalemia.   Consultants:   None  Procedures:   None  Antimicrobials:   None   Subjective: Patient seen and evaluated today with no new acute complaints or concerns. No acute concerns or events noted overnight.  She states that she is ready to go home and denies any bloody stools, or dark/tarry stools.  Objective: Vitals:   11/13/17 2035 11/13/17 2129 11/14/17 0546 11/14/17 0750  BP:  127/72 129/64   Pulse:  99 100   Resp:  16    Temp:  99.3 F (37.4 C) 98.5 F (36.9 C)   TempSrc:  Oral Oral   SpO2: 97% 99% 100% 96%  Weight:  Height:        Intake/Output Summary (Last 24 hours) at 11/14/2017 0924 Last data filed at 11/13/2017 1500 Gross per 24 hour  Intake 329.46 ml  Output -  Net 329.46 ml   Filed Weights   11/13/17 0846  Weight: 67.7 kg    Examination:  General exam: Appears calm and comfortable  Respiratory system: Clear to auscultation.  Respiratory effort normal. Cardiovascular system: S1 & S2 heard, RRR. No JVD, murmurs, rubs, gallops or clicks. No pedal edema. Gastrointestinal system: Abdomen is nondistended, soft and nontender. No organomegaly or masses felt. Normal bowel sounds heard. Central nervous system: Alert and oriented. No focal neurological deficits. Extremities: Symmetric 5 x 5 power. Skin: No rashes, lesions or ulcers Psychiatry: Judgement and insight appear normal. Mood & affect appropriate.     Data Reviewed: I have personally reviewed following labs and imaging studies  CBC: Recent Labs  Lab 11/12/17 1412 11/13/17 0923 11/14/17 0527  WBC 10.8 7.5 6.5  NEUTROABS 8.0* 5.7  --   HGB 8.2* 7.5* 6.3*  HCT 28.2* 26.5* 23.7*  MCV 76* 78.4* 79.5*  PLT 343 273 536   Basic Metabolic Panel: Recent Labs  Lab 11/12/17 1412 11/13/17 0923 11/13/17 1303 11/14/17 0527  NA 138 137  --  140  K 2.4* <2.0*  --  3.6  CL 90* 94*  --  100  CO2 31* 33*  --  31  GLUCOSE 243* 236*  --  199*  BUN 10 7*  --  9  CREATININE 1.03* 0.96  --  0.98  CALCIUM 9.0 8.6*  --  8.3*  MG  --  2.0  --  1.9  PHOS  --   --  2.3*  --    GFR: Estimated Creatinine Clearance: 52.9 mL/min (by C-G formula based on SCr of 0.98 mg/dL). Liver Function Tests: Recent Labs  Lab 11/13/17 0923  AST 17  ALT 9  ALKPHOS 78  BILITOT 0.6  PROT 6.5  ALBUMIN 2.9*   No results for input(s): LIPASE, AMYLASE in the last 168 hours. No results for input(s): AMMONIA in the last 168 hours. Coagulation Profile: No results for input(s): INR, PROTIME in the last 168 hours. Cardiac Enzymes: No results for input(s): CKTOTAL, CKMB, CKMBINDEX, TROPONINI in the last 168 hours. BNP (last 3 results) No results for input(s): PROBNP in the last 8760 hours. HbA1C: Recent Labs    11/13/17 0927  HGBA1C 8.0*   CBG: Recent Labs  Lab 11/13/17 1757 11/13/17 2131 11/14/17 0740  GLUCAP 330* 281* 168*   Lipid Profile: No results for input(s): CHOL,  HDL, LDLCALC, TRIG, CHOLHDL, LDLDIRECT in the last 72 hours. Thyroid Function Tests: Recent Labs    11/13/17 0923  TSH 0.298*   Anemia Panel: Recent Labs    11/13/17 1707  VITAMINB12 684  FOLATE 8.1  FERRITIN 3*  TIBC 380  IRON 16*  RETICCTPCT 2.1   Sepsis Labs: No results for input(s): PROCALCITON, LATICACIDVEN in the last 168 hours.  No results found for this or any previous visit (from the past 240 hour(s)).       Radiology Studies: No results found.      Scheduled Meds: . sodium chloride   Intravenous Once  . acetaminophen      . aspirin EC  81 mg Oral Daily  . clopidogrel  75 mg Oral QPM  . fluticasone  2 spray Each Nare BID  . gabapentin  400 mg Oral QHS  . insulin aspart  0-15  Units Subcutaneous TID WC  . insulin glargine  30 Units Subcutaneous QHS  . levothyroxine  100 mcg Oral QAC breakfast  . pantoprazole  40 mg Oral Daily  . rosuvastatin  40 mg Oral QPM  . tiotropium  1 capsule Inhalation Daily  . Vitamin D (Ergocalciferol)  50,000 Units Oral Q7 days   Continuous Infusions: . 0.9 % NaCl with KCl 40 mEq / L 100 mL/hr (11/14/17 9136)  . ferumoxytol       LOS: 0 days    Time spent: 30 minutes    Rachel [name] Darleen Crocker, DO [facility] Pager [phone]  If 7PM-7AM, please contact night-coverage [email or web address] Password [email or web address] 11/14/2017, 9:24 AM

## 2017-11-14 NOTE — Progress Notes (Signed)
Patient provided verbal consent for blood transfusion this morning prior to transfusion. Stated MD had explained need for blood transfusion and she had signed blood consent. RN noted consent on chart. Prior to 2nd unit of PRBCs, no blood consent found on chart. New blood consent signed by patient. Patient and daughter state she signed one last night. Unsure where consent is at this time. New consent on chart. Donavan Foil, RN

## 2017-11-15 ENCOUNTER — Telehealth: Payer: Self-pay | Admitting: Family Medicine

## 2017-11-15 DIAGNOSIS — D649 Anemia, unspecified: Secondary | ICD-10-CM

## 2017-11-15 DIAGNOSIS — E876 Hypokalemia: Secondary | ICD-10-CM

## 2017-11-15 LAB — GLUCOSE, CAPILLARY
Glucose-Capillary: 62 mg/dL — ABNORMAL LOW (ref 70–99)
Glucose-Capillary: 66 mg/dL — ABNORMAL LOW (ref 70–99)
Glucose-Capillary: 136 mg/dL — ABNORMAL HIGH (ref 70–99)

## 2017-11-15 LAB — BASIC METABOLIC PANEL
Anion gap: 9 (ref 5–15)
BUN: 9 mg/dL (ref 8–23)
CHLORIDE: 103 mmol/L (ref 98–111)
CO2: 29 mmol/L (ref 22–32)
Calcium: 8.8 mg/dL — ABNORMAL LOW (ref 8.9–10.3)
Creatinine, Ser: 0.95 mg/dL (ref 0.44–1.00)
GFR calc Af Amer: >60 mL/min (ref >60)
GFR calc non Af Amer: >60 mL/min (ref >60)
GLUCOSE: 108 mg/dL — AB (ref 70–99)
POTASSIUM: 3.4 mmol/L — AB (ref 3.5–5.1)
Sodium: 141 mmol/L (ref 135–145)

## 2017-11-15 LAB — CBC
HCT: 32.6 % — ABNORMAL LOW (ref 36.0–46.0)
HEMOGLOBIN: 9.4 g/dL — AB (ref 12.0–15.0)
MCH: 23.4 pg — AB (ref 26.0–34.0)
MCHC: 28.8 g/dL — ABNORMAL LOW (ref 30.0–36.0)
MCV: 81.3 fL (ref 80.0–100.0)
Platelets: 262 10*3/uL (ref 150–400)
RBC: 4.01 MIL/uL (ref 3.87–5.11)
RDW: 20 % — ABNORMAL HIGH (ref 11.5–15.5)
WBC: 8.7 10*3/uL (ref 4.0–10.5)
nRBC: 0 % (ref 0.0–0.2)

## 2017-11-15 LAB — TYPE AND SCREEN
ABO/RH(D): O POS
Antibody Screen: NEGATIVE
Unit division: 0
Unit division: 0

## 2017-11-15 LAB — BPAM RBC
BLOOD PRODUCT EXPIRATION DATE: 201911072359
Blood Product Expiration Date: 201911082359
ISSUE DATE / TIME: 201910091419
ISSUE DATE / TIME: 201910091112
UNIT TYPE AND RH: 5100
Unit Type and Rh: 5100

## 2017-11-15 MED ORDER — POTASSIUM CHLORIDE CRYS ER 20 MEQ PO TBCR
40.0000 meq | EXTENDED_RELEASE_TABLET | Freq: Once | ORAL | Status: AC
  Administered 2017-11-15: 40 meq via ORAL
  Filled 2017-11-15: qty 2

## 2017-11-15 MED ORDER — DOCUSATE SODIUM 100 MG PO CAPS: 100.0000 mg | ORAL_CAPSULE | Freq: Every day | ORAL | 11 refills | Status: DC

## 2017-11-15 MED ORDER — DM-GUAIFENESIN ER 30-600 MG PO TB12: 1.0000 | ORAL_TABLET | Freq: Two times a day (BID) | ORAL | 0 refills | Status: AC

## 2017-11-15 MED ORDER — FERROUS SULFATE 325 (65 FE) MG PO TABS: 325.0000 mg | ORAL_TABLET | Freq: Every day | ORAL | 3 refills | Status: DC

## 2017-11-15 NOTE — Telephone Encounter (Signed)
Hospital follow up scheduled with Dr.Steve for 10/21. hospital recommends that she have blood work done before this appt preferred: CBC & BMP. advise

## 2017-11-15 NOTE — Progress Notes (Signed)
Hypoglycemic Event  CBG: 62  Treatment: 15 GM carbohydrate snack  Symptoms: Shaky  Follow-up CBG: Time: 810 CBG Result:66 then 136  Possible Reasons for Event:   Comments/MD notified:  Patient alert and eating breakfast    Chiniqua Kilcrease

## 2017-11-15 NOTE — Progress Notes (Signed)
Patient's IVs (2) removed.  Site WNL.  AVS reviewed with patient.  Verbalized understanding of discharge instructions, physician follow-up, medications.  Patient reports belongings intact and in possession at time of discharge.  Patient stable at time of discharge.

## 2017-11-15 NOTE — Telephone Encounter (Signed)
Ok met 7 and cbc do at least two days before

## 2017-11-15 NOTE — Discharge Summary (Signed)
Physician Discharge Summary  Rachel [name] MWU:[id number] DOB: 11-Oct-1950 DOA: 11/13/2017  PCP: Mikey Kirschner, MD  Admit date: 11/13/2017  Discharge date: 11/15/2017  Admitted From:Home  Disposition:  Home  Recommendations for Outpatient Follow-up:  1. Follow up with PCP in 1-2 weeks 2. Please obtain CBC/BMP in one week  Home Health:N/A  Equipment/Devices:None  Discharge Condition:Stable  CODE STATUS: Full  Diet recommendation: Heart Healthy  Brief/Interim Summary:  Per HPI from Dr. Dyann Kief 10/8: Rachel A Pickardis a 67 y.o.femalewith past medical history significant for asthma/COPD, hypertension, type 2 diabetes mellitus, history of coronary artery disease (status post drug-eluting stent x2 on her RCA in February 2010),gastroesophageal reflux disease and peripheral arterial disease; who presented to the hospital secondary to generalized weakness and fatigue that has been ongoing for over a month now. Patient was seen by her PCP who found patient with anemia (hemoglobin of8.2)and also severe hypokalemia with a potassium less than 2; patientwas contacted and advised to come to the emergency department for further evaluation and management.  Her stool occult blood have been noted to be negative, but her anemia panel demonstrated some severe iron deficiency anemia.  Her hemoglobin had dropped to 6.3 and therefore she was given 2 units of PRBC transfusion with improvement to 9.4 on day of discharge.  She was also given Feraheme on account of severe iron deficiency anemia.  She was recommended to start iron supplementation which has been prescribed along with Colace.  She will need a repeat CBC in approximately 1 to 2 weeks on follow-up with her PCP.  She is to alert her PCP or come to the ED if she is noted to have any further symptomatology or noted blood in her stools.  Continue iron supplementation as otherwise prescribed at home.  Restart home HCTZ and continue to monitor  levels of potassium.  Discharge Diagnoses:  Principal Problem:   Hypokalemia Active Problems:   DM type 2 causing vascular disease ([place])   Mixed hyperlipidemia   TOBACCO ABUSE   GERD (gastroesophageal reflux disease)   Essential hypertension, benign   Hypothyroidism   Generalized weakness    Discharge Instructions  Discharge Instructions    Diet - low sodium heart healthy   Complete by:  As directed    Increase activity slowly   Complete by:  As directed      Allergies as of 11/15/2017      Reactions   Altace [ramipril] Cough   Biaxin [clarithromycin]    Fatigue   Niacin And Related Other (See Comments)   Flush - felt like on fire.   Nsaids Other (See Comments)   feverish   Tramadol    Lethargic    Adhesive [tape] Rash   Paper tape is ok      Medication List    TAKE these medications   albuterol 108 (90 Base) MCG/ACT inhaler Commonly known as:  PROVENTIL HFA;VENTOLIN HFA Inhale 2 puffs into the lungs every 4 (four) hours as needed for wheezing or shortness of breath.   aspirin EC 81 MG tablet Take 81 mg by mouth daily.   clopidogrel 75 MG tablet Commonly known as:  PLAVIX Take 1 tablet (75 mg total) by mouth daily.   dextromethorphan-guaiFENesin 30-600 MG 12hr tablet Commonly known as:  MUCINEX DM Take 1 tablet by mouth 2 (two) times daily for 7 days.   docusate sodium 100 MG capsule Commonly known as:  COLACE Take 1 capsule (100 mg total) by mouth daily.   ferrous  sulfate 325 (65 FE) MG tablet Take 1 tablet (325 mg total) by mouth daily.   fluticasone 50 MCG/ACT nasal spray Commonly known as:  FLONASE Place 2 sprays into both nostrils daily. What changed:  when to take this   gabapentin 400 MG capsule Commonly known as:  NEURONTIN TAKE ONE CAPSULE BY MOUTH AT BEDTIME AS DIRECTED What changed:    how much to take  how to take this  when to take this  additional instructions   Insulin Glargine 100 UNIT/ML Solostar Pen Commonly known  as:  LANTUS Inject 40 Units into the skin daily. What changed:  when to take this   levothyroxine 100 MCG tablet Commonly known as:  SYNTHROID, LEVOTHROID TAKE 1 TABLET BY MOUTH EVERY DAY BEFORE BREAKFAST What changed:  See the new instructions.   meclizine 25 MG tablet Commonly known as:  ANTIVERT Take 1 tablet (25 mg total) by mouth 3 (three) times daily as needed for dizziness.   NOVOLOG FLEXPEN 100 UNIT/ML FlexPen Generic drug:  insulin aspart INJECT 14-20 UNITS 3 TIMES DAILY WITH MEALS INTO THE SKIN What changed:  See the new instructions.   pantoprazole 40 MG tablet Commonly known as:  PROTONIX Take 1 tablet (40 mg total) by mouth daily.   potassium chloride SA 20 MEQ tablet Commonly known as:  K-DUR,KLOR-CON Take 1 tablet (20 mEq total) by mouth 2 (two) times daily.   rosuvastatin 40 MG tablet Commonly known as:  CRESTOR TAKE 1 TABLET EVERY DAY AT BEDTIME What changed:  See the new instructions.   Tiotropium Bromide Monohydrate 2.5 MCG/ACT Aers Inhale 2 puffs into the lungs daily.   Vitamin D (Ergocalciferol) 50000 units Caps capsule Commonly known as:  DRISDOL TAKE 1 CAPSULE (50,000 UNITS TOTAL) BY MOUTH EVERY 7 (SEVEN) DAYS.      Follow-up Information    Mikey Kirschner, MD Follow up in 1 week(s).   Specialty:  Family Medicine Why:  Recheck CBC to ensure stability of hemoglobin at that time Contact information: [place] [address] [phone]          Allergies  Allergen Reactions  . Altace [Ramipril] Cough  . Biaxin [Clarithromycin]     Fatigue  . Niacin And Related Other (See Comments)    Flush - felt like on fire.  . Nsaids Other (See Comments)    feverish  . Tramadol     Lethargic   . Adhesive [Tape] Rash    Paper tape is ok    Consultations:  None   Procedures/Studies:  No results found.  Discharge Exam: Vitals:   11/15/17 0607 11/15/17 0748  BP: 126/73   Pulse: 84   Resp: 17   Temp: 98.6 F  (37 C)   SpO2: 93% 94%   Vitals:   11/14/17 2116 11/14/17 2221 11/15/17 0607 11/15/17 0748  BP: 140/77  126/73   Pulse: 79  84   Resp: 20  17   Temp: 99.1 F (37.3 C)  98.6 F (37 C)   TempSrc: Oral  Oral   SpO2: 96% 99% 93% 94%  Weight:      Height:        General: Pt is alert, awake, not in acute distress Cardiovascular: RRR, S1/S2 +, no rubs, no gallops Respiratory: CTA bilaterally, no wheezing, no rhonchi Abdominal: Soft, NT, ND, bowel sounds + Extremities: no edema, no cyanosis    The results of significant diagnostics from this hospitalization (including imaging, microbiology, ancillary and laboratory)  are listed below for reference.     Microbiology: No results found for this or any previous visit (from the past 240 hour(s)).   Labs: BNP (last 3 results) No results for input(s): BNP in the last 8760 hours. Basic Metabolic Panel: Recent Labs  Lab 11/12/17 1412 11/13/17 0923 11/13/17 1303 11/14/17 0527 11/15/17 0525  NA 138 137  --  140 141  K 2.4* <2.0*  --  3.6 3.4*  CL 90* 94*  --  100 103  CO2 31* 33*  --  31 29  GLUCOSE 243* 236*  --  199* 108*  BUN 10 7*  --  9 9  CREATININE 1.03* 0.96  --  0.98 0.95  CALCIUM 9.0 8.6*  --  8.3* 8.8*  MG  --  2.0  --  1.9  --   PHOS  --   --  2.3*  --   --    Liver Function Tests: Recent Labs  Lab 11/13/17 0923  AST 17  ALT 9  ALKPHOS 78  BILITOT 0.6  PROT 6.5  ALBUMIN 2.9*   No results for input(s): LIPASE, AMYLASE in the last 168 hours. No results for input(s): AMMONIA in the last 168 hours. CBC: Recent Labs  Lab 11/12/17 1412 11/13/17 0923 11/14/17 0527 11/15/17 0525  WBC 10.8 7.5 6.5 8.7  NEUTROABS 8.0* 5.7  --   --   HGB 8.2* 7.5* 6.3* 9.4*  HCT 28.2* 26.5* 23.7* 32.6*  MCV 76* 78.4* 79.5* 81.3  PLT 343 273 236 262   Cardiac Enzymes: No results for input(s): CKTOTAL, CKMB, CKMBINDEX, TROPONINI in the last 168 hours. BNP: Invalid input(s): POCBNP CBG: Recent Labs  Lab 11/14/17 1703  11/14/17 2116 11/15/17 0746 11/15/17 0810 11/15/17 0904  GLUCAP 185* 242* 62* 66* 136*   D-Dimer No results for input(s): DDIMER in the last 72 hours. Hgb A1c Recent Labs    11/13/17 0927  HGBA1C 8.0*   Lipid Profile No results for input(s): CHOL, HDL, LDLCALC, TRIG, CHOLHDL, LDLDIRECT in the last 72 hours. Thyroid function studies Recent Labs    11/13/17 0923  TSH 0.298*   Anemia work up Recent Labs    11/13/17 1707  VITAMINB12 684  FOLATE 8.1  FERRITIN 3*  TIBC 380  IRON 16*  RETICCTPCT 2.1   Urinalysis    Component Value Date/Time   COLORURINE YELLOW 07/23/2015 0610   APPEARANCEUR CLEAR 07/23/2015 0610   LABSPEC 1.010 07/23/2015 0610   PHURINE 6.0 07/23/2015 0610   GLUCOSEU >1000 (A) 07/23/2015 0610   HGBUR NEGATIVE 07/23/2015 0610   BILIRUBINUR NEGATIVE 07/23/2015 0610   KETONESUR NEGATIVE 07/23/2015 0610   PROTEINUR 30 (A) 07/23/2015 0610   NITRITE NEGATIVE 07/23/2015 0610   LEUKOCYTESUR NEGATIVE 07/23/2015 0610   Sepsis Labs Invalid input(s): PROCALCITONIN,  WBC,  LACTICIDVEN Microbiology No results found for this or any previous visit (from the past 240 hour(s)).   Time coordinating discharge: 35 minutes  SIGNED:   Rodena Goldmann, DO [facility] 11/15/2017, 9:32 AM Pager [phone]  If 7PM-7AM, please contact night-coverage [email or web address] Password [email or web address]

## 2017-11-15 NOTE — Telephone Encounter (Signed)
Orders put in and left message for pt to return call

## 2017-11-15 NOTE — Telephone Encounter (Signed)
Patient notified and verbalized understanding. 

## 2017-11-19 ENCOUNTER — Telehealth: Payer: Self-pay | Admitting: Family Medicine

## 2017-11-19 ENCOUNTER — Other Ambulatory Visit: Payer: Self-pay | Admitting: "Endocrinology

## 2017-11-19 ENCOUNTER — Other Ambulatory Visit: Payer: Self-pay | Admitting: *Deleted

## 2017-11-19 ENCOUNTER — Encounter: Payer: Self-pay | Admitting: Family Medicine

## 2017-11-19 MED ORDER — CEFDINIR 300 MG PO CAPS: 300.0000 mg | ORAL_CAPSULE | Freq: Two times a day (BID) | ORAL | 0 refills | Status: DC

## 2017-11-19 NOTE — Telephone Encounter (Signed)
She states she is not on a stool softener,but started taking Ferrous Sulfate on Friday and then developed the diarrhea shortly after that. She has not been running a fever. She states she did not take the ferrous sulfate after Friday after thinking the diarrhea was caused by it.

## 2017-11-19 NOTE — Telephone Encounter (Signed)
Pt is stating that the cedfinir didn't get called in after her visit on 11/12/17 to [id number] - [place], [place]

## 2017-11-19 NOTE — Telephone Encounter (Signed)
Please advise 

## 2017-11-19 NOTE — Telephone Encounter (Signed)
fe sulfate generally does not cause iarhea, wait one week and try again after this diarrhea fades away

## 2017-11-19 NOTE — Telephone Encounter (Signed)
omnicef 300 bid ten d 

## 2017-11-19 NOTE — Telephone Encounter (Signed)
Patient is aware 

## 2017-11-19 NOTE — Telephone Encounter (Signed)
Med was on pt's after visit summary to pick up at pharm but pt states pharm states they never got the rx. She is having congestion that is clear and drainage in throat, no fever, no sob. Started a few days before her visit on the 7th. Pt would like rx sent in again.

## 2017-11-19 NOTE — Telephone Encounter (Signed)
Patient saying she was in the hospital last week and the iron medicine the doctor prescribed is causing her to have diarrhea.  I asked if she was taking the colace that was prescribed and she said no.  She said she hasn't taken the iron pills since Friday but still has diarrhea.  Pt is scheduled for a hospital f/u next Monday.

## 2017-11-19 NOTE — Telephone Encounter (Signed)
rx sent to pharm. Left message to return call to notify pt.

## 2017-11-19 NOTE — Telephone Encounter (Signed)
Med sent to pharm. Pt notified.  

## 2017-11-20 ENCOUNTER — Encounter: Payer: Self-pay | Admitting: Internal Medicine

## 2017-11-22 LAB — CBC WITH DIFFERENTIAL/PLATELET
Basophils Absolute: 0.1 10*3/uL (ref 0.0–0.2)
Basos: 1 %
EOS (ABSOLUTE): 0.1 10*3/uL (ref 0.0–0.4)
Eos: 1 %
HEMATOCRIT: 37 % (ref 34.0–46.6)
HEMOGLOBIN: 11.6 g/dL (ref 11.1–15.9)
Immature Grans (Abs): 0 10*3/uL (ref 0.0–0.1)
Immature Granulocytes: 0 %
LYMPHS ABS: 2.2 10*3/uL (ref 0.7–3.1)
Lymphs: 26 %
MCH: 25.5 pg — ABNORMAL LOW (ref 26.6–33.0)
MCHC: 31.4 g/dL — AB (ref 31.5–35.7)
MCV: 81 fL (ref 79–97)
MONOCYTES: 3 %
Monocytes Absolute: 0.3 10*3/uL (ref 0.1–0.9)
NEUTROS ABS: 5.8 10*3/uL (ref 1.4–7.0)
Neutrophils: 69 %
Platelets: 298 10*3/uL (ref 150–450)
RBC: 4.55 x10E6/uL (ref 3.77–5.28)
RDW: 21.8 % — ABNORMAL HIGH (ref 12.3–15.4)
WBC: 8.5 10*3/uL (ref 3.4–10.8)

## 2017-11-22 LAB — BASIC METABOLIC PANEL
BUN/Creatinine Ratio: 5 — ABNORMAL LOW (ref 12–28)
BUN: 5 mg/dL — ABNORMAL LOW (ref 8–27)
CO2: 22 mmol/L (ref 20–29)
Calcium: 9.1 mg/dL (ref 8.7–10.3)
Chloride: 101 mmol/L (ref 96–106)
Creatinine, Ser: 0.98 mg/dL (ref 0.57–1.00)
GFR calc Af Amer: 70 mL/min/{1.73_m2} (ref >59)
GFR, EST NON AFRICAN AMERICAN: 61 mL/min/{1.73_m2} (ref >59)
Glucose: 165 mg/dL — ABNORMAL HIGH (ref 65–99)
Potassium: 4 mmol/L (ref 3.5–5.2)
SODIUM: 140 mmol/L (ref 134–144)

## 2017-11-26 ENCOUNTER — Ambulatory Visit (INDEPENDENT_AMBULATORY_CARE_PROVIDER_SITE_OTHER): Payer: Medicare Other | Admitting: Family Medicine

## 2017-11-26 ENCOUNTER — Encounter: Payer: Self-pay | Admitting: Family Medicine

## 2017-11-26 VITALS — BP 118/74 | Ht 63.0 in | Wt 151.4 lb

## 2017-11-26 DIAGNOSIS — D509 Iron deficiency anemia, unspecified: Secondary | ICD-10-CM

## 2017-11-26 DIAGNOSIS — E876 Hypokalemia: Secondary | ICD-10-CM

## 2017-11-26 DIAGNOSIS — D649 Anemia, unspecified: Secondary | ICD-10-CM

## 2017-11-26 LAB — POCT HEMOGLOBIN: Hemoglobin: 11.1 g/dL (ref 9.5–13.5)

## 2017-11-26 NOTE — Progress Notes (Signed)
   Subjective:    Patient ID: Rachel [name], female    DOB: Jun 07, 1950, 67 y.o.   MRN: [id number]  HPIpt went to ED because she was feeling tired. Pt states she is feeling much better now. Pt states she is suppose to start back taking iron tablet today.    Results for orders placed or performed in visit on 11/26/17  POCT hemoglobin  Result Value Ref Range   Hemoglobin 11.1 9.5 - 13.5 g/dL   Patient arrives for follow-up from prior hospitalization.  Please see discharge summary.  Complete hospital record specialist notes discharge summary and blood work all reviewed today in presence of patient.  Patient presented with 2 chief problems.  She had severe hypokalemia.  This was felt to the diuretic that she was taking at the time.  No longer is taking it.  Patient also had substantial anemia.  Iron deficient in nature.  No history of noticeable bleeding.  Last menstrual cycle 15 years ago.  7 to 8 years from last colonoscopy.  At this time overall feeling better.  No longer on diuretics.  Review of Systems No headache, no major weight loss or weight gain, no chest pain no back pain abdominal pain no change in bowel habits complete ROS otherwise negative     Objective:   Physical Exam  Alert and oriented, vitals reviewed and stable, NAD ENT-TM's and ext canals WNL bilat via otoscopic exam Soft palate, tonsils and post pharynx WNL via oropharyngeal exam Neck-symmetric, no masses; thyroid nonpalpable and nontender Pulmonary-no tachypnea or accessory muscle use; Clear without wheezes via auscultation Card--no abnrml murmurs, rhythm reg and rate WNL Carotid pulses symmetric, without bruits   Impression iron deficiency anemia.  Concerning in this patient who is menopausal.  Long discussion held.  Need to assess potential GI source.  Rationale discussed.  We will proceed with referral.  2.  Hypokalemia.  Resolved.  I think this will remain good with patient away from diuretics at this  point.      Assessment & Plan:

## 2017-12-06 ENCOUNTER — Telehealth: Payer: Self-pay | Admitting: Family Medicine

## 2017-12-06 DIAGNOSIS — E785 Hyperlipidemia, unspecified: Secondary | ICD-10-CM

## 2017-12-06 DIAGNOSIS — D649 Anemia, unspecified: Secondary | ICD-10-CM

## 2017-12-06 NOTE — Telephone Encounter (Signed)
Orders put in. Pt notified.  

## 2017-12-06 NOTE — Telephone Encounter (Signed)
Patient has a 4 week follow appt scheduled for 12/10/17 with Dr,Steve, patient is wondering if any blood work needs to be completed for this appt patient has had blood work completed for month of October, advise.

## 2017-12-06 NOTE — Telephone Encounter (Signed)
Lip cbc

## 2017-12-06 NOTE — Telephone Encounter (Signed)
Labs 10/19: Met 7, CBC, Mg. Ferritin, Iron and TIBC, Folate, B12, Cortisol, HIV, HgbA1c TSH, phosphorous, Liver            5/19: Lipid

## 2017-12-08 LAB — CBC WITH DIFFERENTIAL/PLATELET
BASOS ABS: 0.1 10*3/uL (ref 0.0–0.2)
Basos: 1 %
EOS (ABSOLUTE): 0.2 10*3/uL (ref 0.0–0.4)
Eos: 2 %
HEMATOCRIT: 41.4 % (ref 34.0–46.6)
Hemoglobin: 13.1 g/dL (ref 11.1–15.9)
Immature Grans (Abs): 0 10*3/uL (ref 0.0–0.1)
Immature Granulocytes: 0 %
LYMPHS ABS: 2.4 10*3/uL (ref 0.7–3.1)
Lymphs: 24 %
MCH: 26.7 pg (ref 26.6–33.0)
MCHC: 31.6 g/dL (ref 31.5–35.7)
MCV: 84 fL (ref 79–97)
MONOCYTES: 4 %
Monocytes Absolute: 0.4 10*3/uL (ref 0.1–0.9)
NEUTROS ABS: 6.9 10*3/uL (ref 1.4–7.0)
Neutrophils: 69 %
PLATELETS: 285 10*3/uL (ref 150–450)
RBC: 4.91 x10E6/uL (ref 3.77–5.28)
RDW: 23.6 % — ABNORMAL HIGH (ref 12.3–15.4)
WBC: 9.9 10*3/uL (ref 3.4–10.8)

## 2017-12-08 LAB — LIPID PANEL
CHOL/HDL RATIO: 2.5 ratio (ref 0.0–4.4)
Cholesterol, Total: 132 mg/dL (ref 100–199)
HDL: 52 mg/dL (ref >39)
LDL CALC: 61 mg/dL (ref 0–99)
TRIGLYCERIDES: 93 mg/dL (ref 0–149)
VLDL Cholesterol Cal: 19 mg/dL (ref 5–40)

## 2017-12-09 ENCOUNTER — Encounter: Payer: Self-pay | Admitting: Family Medicine

## 2017-12-10 ENCOUNTER — Other Ambulatory Visit: Payer: Self-pay | Admitting: Acute Care

## 2017-12-10 DIAGNOSIS — Z122 Encounter for screening for malignant neoplasm of respiratory organs: Secondary | ICD-10-CM

## 2017-12-10 DIAGNOSIS — F17210 Nicotine dependence, cigarettes, uncomplicated: Secondary | ICD-10-CM

## 2017-12-11 ENCOUNTER — Ambulatory Visit (INDEPENDENT_AMBULATORY_CARE_PROVIDER_SITE_OTHER): Payer: Medicare Other | Admitting: Family Medicine

## 2017-12-11 ENCOUNTER — Encounter: Payer: Self-pay | Admitting: Family Medicine

## 2017-12-11 VITALS — BP 116/72 | Ht 63.0 in | Wt 149.6 lb

## 2017-12-11 DIAGNOSIS — D509 Iron deficiency anemia, unspecified: Secondary | ICD-10-CM | POA: Diagnosis not present

## 2017-12-11 DIAGNOSIS — I6521 Occlusion and stenosis of right carotid artery: Secondary | ICD-10-CM

## 2017-12-11 DIAGNOSIS — I10 Essential (primary) hypertension: Secondary | ICD-10-CM | POA: Diagnosis not present

## 2017-12-11 DIAGNOSIS — R0609 Other forms of dyspnea: Secondary | ICD-10-CM

## 2017-12-11 DIAGNOSIS — R0600 Dyspnea, unspecified: Secondary | ICD-10-CM

## 2017-12-11 DIAGNOSIS — E785 Hyperlipidemia, unspecified: Secondary | ICD-10-CM | POA: Diagnosis not present

## 2017-12-11 MED ORDER — GABAPENTIN 400 MG PO CAPS: ORAL_CAPSULE | ORAL | 1 refills | Status: DC

## 2017-12-11 NOTE — Patient Instructions (Signed)
Please start taking an over the counter multivitamin with iron every day forver, this gives you the right building blocks to keep your blood strong

## 2017-12-11 NOTE — Progress Notes (Signed)
   Subjective:    Patient ID: Rachel [name], female    DOB: [birth year], 67 y.o.   MRN: [id number] Patient arrives for follow-up of numerous concerns HPI  Patient arrives for a follow up on anemia and hypokalemia-see recent labs. Patient states she is having a hard time with iron tablets because they give her diarrhea and is interested in iron infusion.  Pt due to see g I folks on the 19th of nov  appt on the 12th with endocrim    pt states "cant do the iron pills"on the third day or iron tabs diarhe stats    Breathing overall better   Leg pain no longer a major problem   Energy improving  Unfortunately still smoking but patient working on it.  Compliant with cholesterol medicines  Patient continues to take lipid medication regularly. No obvious side effects from it. Generally does not miss a dose. Prior blood work results are reviewed with patient. Patient continues to work on fat intake in diet  Blood pressure medicine and blood pressure levels reviewed today with patient. Compliant with blood pressure medicine. States does not miss a dose. No obvious side effects. Blood pressure generally good when checked elsewhere. Watching salt intake.      Review of Systems No headache, no major weight loss or weight gain, no chest pain no back pain abdominal pain no change in bowel habits complete ROS otherwise negative     Objective:   Physical Exam  Alert and oriented, vitals reviewed and stable, NAD ENT-TM's and ext canals WNL bilat via otoscopic exam Soft palate, tonsils and post pharynx WNL via oropharyngeal exam Neck-symmetric, no masses; thyroid nonpalpable and nontender Pulmonary-no tachypnea or accessory muscle use; Clear without wheezes via auscultation Card--no abnrml murmurs, rhythm reg and rate WNL Carotid pulses symmetric, without bruits       Assessment & Plan:  Impression anemia.  Much improved.  Having tolerance issues with oral iron.  With hemoglobin  excellent to move to multivitamin with iron.  In the midst of GI work-up  2.  Hypokalemia clinically resolved patient to maintain same therapy  3.  Hypertension.  Number still good off the diuretic will maintain same approach for now  4.  Type 2 diabetes.  Due to see another endocrinologist soon  5.  Fatigue.  Has improved as these other issues have improved discussed  Encouraged to stop smoking.  Flu shot discussed.  Medications refilled.  Follow-up in 6 months diet exercise discussed

## 2017-12-17 NOTE — Progress Notes (Signed)
Name: KENDRAH LOVERN  MRN/ DOB: [id number], 1950-08-14   Age/ Sex: 67 y.o., female    PCP: Mikey Kirschner, MD   Reason for Endocrinology Evaluation: Type 2 Diabetes Mellitus     Date of Initial Endocrinology Visit: 12/18/2017     PATIENT IDENTIFIER: Ms. LATRENA BENEGAS is a 67 y.o. female with a past medical history of Hypothyroidism, DM, CAD (S/P PCI), COPD and Dyslipidemia. The patient presented for initial ndocrinology clinic visit on 12/18/2017 for consultative assistance with her diabetes management.    HPI: Ms. Bertoni was    Diagnosed with T2DM ~ 20 yrs Prior Medications tried: Metformin Hypoglycemia episodes :   Yes            Symptoms:  shaking             Frequency: 3/ Week Hemoglobin A1c has ranged from 6.0% in 2016, peaking at 12.3% in 2017. Currently checking blood sugars 3 x / day,  before meals and bedtime.  Patient required assistance for hypoglycemia: No Patient has required hospitalization within the last 1 year from hyper or hypoglycemia: No   In terms of diet, the patient drinks regular coke with meds, she eats 3 consistent meals without much snacking   Patient presented to the ED back in October with nausea and vomiting, GFR was 37 and Metformin was stopped. Her renal function is back to baseline.   HOME DIABETES REGIMEN: Lantus 40 units QHS Novolog 14-20 units TID QAC   Statin: yes ACE-I/ARB: no Prior Diabetic Education:Yes       CONTINUOUS GLUCOSE MONITORING RECORD INTERPRETATION    Dates of Recording: 10/30-11/12/19  Sensor description: [employer]  Results statistics:   CGM use % of time 96  Average and SD 162/62.3  Time in range      65  %  % Time Above 180 32  % Time above 250 9  % Time Below target 3    Glycemic patterns summary: Hyperglycemia noted after lunch   Hyperglycemic episodes  Meal times   Hypoglycemic episodes occurred after meals , some in the middle of the night as well after having a very high bedtime  glucose   Overnight periods: intermittent hypoglycemia  Preprandial periods: within goal for breakfast and lunch  But high with pre- supper       DIABETIC COMPLICATIONS: Microvascular complications:   Neuropathy  Denies: retinopothy   Last eye exam: Completed 3 yrs ago   Macrovascular complications:   PVD, CAD   Denies: CVA   PAST HISTORY: Past Medical History:  Past Medical History:  Diagnosis Date  . Asthma   . Coronary atherosclerosis of native coronary artery    DES x 2 (overlapping) RCA 03/2008 - patent 2011  . Diabetes type 2, controlled ([place]) 2000  . Essential hypertension, benign   . Fatty liver   . GERD (gastroesophageal reflux disease)   . Hyperlipidemia   . Microalbuminuria   . Obesity   . Odynophagia    Severe esophagitis on CT 05/2010, treated empirically with Diflucan  . PAD (peripheral artery disease) (HCC)    history of left greater than right lower 70 claudication  . Tubular adenoma of colon 07/2007   Multiple colonic polyps  . Venous stasis    Past Surgical History:  Past Surgical History:  Procedure Laterality Date  . CARDIAC CATHETERIZATION     stents  . CHOLECYSTECTOMY N/A 07/30/2015   Procedure: LAPAROSCOPIC CHOLECYSTECTOMY;  Surgeon: Aviva Signs, MD;  Location:  [facility];  Service: General;  Laterality: N/A;  . COLONOSCOPY W/ POLYPECTOMY  07/2007   Tubular adenoma  . CORONARY STENT PLACEMENT    . CYSTECTOMY     Pilonidal  . ESOPHAGOGASTRODUODENOSCOPY  04/2010   RMR: Normal esophagus , Stomach D1 and D2   . LOWER EXTREMITY ANGIOGRAPHY N/A 03/13/2016   Procedure: Lower Extremity Angiography;  Surgeon: [name], MD;  Location: [facility];  Service: Cardiovascular;  Laterality: N/A;  . PERIPHERAL VASCULAR ATHERECTOMY  03/27/2016   Procedure: Peripheral Vascular Atherectomy;  Surgeon: [name], MD;  Location: [facility];  Service: Cardiovascular;;  Attempted atherectomy  . PERIPHERAL VASCULAR CATHETERIZATION  Bilateral 09/28/2014   Procedure: Lower Extremity Angiography;  Surgeon: [name], MD;  Location: [facility];  Service: Cardiovascular;  Laterality: Bilateral;  . PERIPHERAL VASCULAR CATHETERIZATION N/A 09/28/2014   Procedure: Abdominal Aortogram;  Surgeon: [name], MD;  Location: [facility];  Service: Cardiovascular;  Laterality: N/A;  . PERIPHERAL VASCULAR CATHETERIZATION N/A 10/15/2014   Procedure: Lower Extremity Angiography;  Surgeon: [name], MD;  Location: [facility];  Service: Cardiovascular;  Laterality: N/A;  . PERIPHERAL VASCULAR CATHETERIZATION  10/15/2014   Procedure: Peripheral Vascular Intervention;  Surgeon: [name], MD;  Location: [facility];  Service: Cardiovascular;;  bilateral iliac stents  . PILONIDAL CYST / SINUS EXCISION    . TUBAL LIGATION Bilateral       Social History:  reports that she has been smoking cigarettes. She has a 12.50 pack-year smoking history. She has never used smokeless tobacco. She reports that she does not drink alcohol or use drugs. Family History:  Family History  Problem Relation Age of Onset  . Bone cancer Father   . Heart disease Maternal Grandmother   . Hypertension Mother   . Colon cancer Neg Hx   . Liver disease Neg Hx   . Inflammatory bowel disease Neg Hx      HOME MEDICATIONS: Allergies as of 12/18/2017      Reactions   Altace [ramipril] Cough   Biaxin [clarithromycin]    Fatigue   Niacin And Related Other (See Comments)   Flush - felt like on fire.   Nsaids Other (See Comments)   feverish   Tramadol    Lethargic    Adhesive [tape] Rash   Paper tape is ok      Medication List        Accurate as of 12/18/17  9:40 AM. Always use your most recent med list.          albuterol 108 (90 Base) MCG/ACT inhaler Commonly known as:  PROVENTIL HFA;VENTOLIN HFA Inhale 2 puffs into the lungs every 4 (four) hours as needed for wheezing or shortness of breath.     aspirin EC 81 MG tablet Take 81 mg by mouth daily.   clopidogrel 75 MG tablet Commonly known as:  PLAVIX Take 1 tablet (75 mg total) by mouth daily.   docusate sodium 100 MG capsule Commonly known as:  COLACE Take 1 capsule (100 mg total) by mouth daily.   fluticasone 50 MCG/ACT nasal spray Commonly known as:  FLONASE Place 2 sprays into both nostrils daily.   gabapentin 400 MG capsule Commonly known as:  NEURONTIN TAKE ONE CAPSULE BY MOUTH AT BEDTIME AS DIRECTED   Insulin Glargine 100 UNIT/ML Solostar Pen Commonly known as:  LANTUS Inject 40 Units into the skin daily.  levothyroxine 100 MCG tablet Commonly known as:  SYNTHROID, LEVOTHROID TAKE 1 TABLET BY MOUTH EVERY DAY BEFORE BREAKFAST   meclizine 25 MG tablet Commonly known as:  ANTIVERT Take 1 tablet (25 mg total) by mouth 3 (three) times daily as needed for dizziness.   NOVOLOG FLEXPEN 100 UNIT/ML FlexPen Generic drug:  insulin aspart INJECT 14-20 UNITS 3 TIMES DAILY WITH MEALS INTO THE SKIN   pantoprazole 40 MG tablet Commonly known as:  PROTONIX Take 1 tablet (40 mg total) by mouth daily.   potassium chloride SA 20 MEQ tablet Commonly known as:  K-DUR,KLOR-CON Take 1 tablet (20 mEq total) by mouth 2 (two) times daily.   rosuvastatin 40 MG tablet Commonly known as:  CRESTOR TAKE 1 TABLET EVERY DAY AT BEDTIME   Tiotropium Bromide Monohydrate 2.5 MCG/ACT Aers Inhale 2 puffs into the lungs daily.        ALLERGIES: Allergies  Allergen Reactions  . Altace [Ramipril] Cough  . Biaxin [Clarithromycin]     Fatigue  . Niacin And Related Other (See Comments)    Flush - felt like on fire.  . Nsaids Other (See Comments)    feverish  . Tramadol     Lethargic   . Adhesive [Tape] Rash    Paper tape is ok     REVIEW OF SYSTEMS: A comprehensive ROS was conducted with the patient and is negative except as per HPI and below:  Review of Systems  Constitutional: Negative for fever and weight loss.  HENT:  Negative for congestion and sore throat.   Eyes: Negative for blurred vision and pain.  Respiratory: Negative for cough and shortness of breath.   Cardiovascular: Negative for chest pain and palpitations.  Gastrointestinal: Negative for diarrhea, nausea and vomiting.  Genitourinary: Negative for frequency.  Skin: Negative.   Neurological: Negative for tingling and tremors.  Endo/Heme/Allergies: Negative for polydipsia.  Psychiatric/Behavioral: Negative for depression. The patient is not nervous/anxious.       OBJECTIVE:   VITAL SIGNS: BP 132/84 (BP Location: Right Arm, Patient Position: Sitting, Cuff Size: Normal)   Pulse 85   Ht 5\' 3"  (1.6 m)   Wt 152 lb 12.8 oz (69.3 kg)   SpO2 98%   BMI 27.07 kg/m    PHYSICAL EXAM:  General: Pt appears well and is in NAD  Hydration: Well-hydrated with moist mucous membranes and good skin turgor  HEENT: Head: Unremarkable with good dentition. Oropharynx clear without exudate.  Eyes: External eye exam normal without stare, lid lag or exophthalmos.  EOM intact.  PERRL.  Neck: General: Supple without adenopathy or carotid bruits. Thyroid: Thyroid size normal.  No goiter or nodules appreciated. No thyroid bruit.  Lungs: Clear with good BS bilat with no rales, rhonchi, or wheezes  Heart: RRR with normal S1 and S2 and no gallops; no murmurs; no rub  Abdomen: Normoactive bowel sounds, soft, nontender, without masses or organomegaly palpable  Extremities:  Lower extremities - No pretibial edema. No lesions.  Skin: Normal texture and temperature to palpation. No rash noted. No Acanthosis nigricans/skin tags. No lipohypertrophy.  Neuro: MS is good with appropriate affect, pt is alert and Ox3    DM foot exam:  The skin of the feet is intact without sores or ulcerations. Plantar callous formation noted bilaterally. Toe nails are thickened and elongated The pedal pulses are 2+ on right and 2+ on left. The sensation is intact to a screening 5.07, 10  gram monofilament bilaterally   DATA REVIEWED:  Lab Results  Component Value Date   HGBA1C 8.0 (H) 11/13/2017   HGBA1C 7.6 (H) 05/10/2017   HGBA1C 8.8 (H) 02/08/2017   Lab Results  Component Value Date   MICROALBUR 1.4 02/08/2017   LDLCALC 61 12/07/2017   CREATININE 0.98 11/21/2017   Lab Results  Component Value Date   MICRALBCREAT 67 (H) 02/08/2017    Lab Results  Component Value Date   CHOL 132 12/07/2017   HDL 52 12/07/2017   LDLCALC 61 12/07/2017   TRIG 93 12/07/2017   CHOLHDL 2.5 12/07/2017        ASSESSMENT / PLAN / RECOMMENDATIONS:   1) Type 2 Diabetes Mellitus, sub-optimally controlled, With microvascular and macrovascular complications - Most recent A1c of 8.0 %. Goal A1c < 7.5 %.    Plan: GENERAL: I have discussed with the patient the pathophysiology of diabetes. We went over the natural progression of the disease. We talked about both insulin resistance and insulin deficiency. We stressed the importance of lifestyle changes including diet and exercise. I explained the complications associated with diabetes including retinopathy, nephropathy, neuropathy as well as increased risk of cardiovascular disease. We went over the benefit seen with glycemic control.    Discussed pharmacokinetics of basal/bolus insulin and its correlation to meals. Pt tends to give Novolog after she is done eating, which is responsible for a  Lot of her hypoglycemia.   I do believe she may need just a little less basal insulin.    She likes Metformin and noted reduced need for insulin while on it. She is under the impression that it causes renal damage, we discussed Metformin does  Not cause renal damage  But that its contra-indicated when there's renal failure due to retention and toxicity.   Since we are restarting her on Metformin, we will reduced her insulin due to increased sensitivity   Counseled about avoiding sugar sweetened beverages and snacks.    MEDICATIONS:  Decrease  Lantus to 30 units daily  Decrease Novolog to 8 units with meals  Correctional insulin (BG-150/35)  Metformin XR 500 mg Two tabs BID with meals - titration provided.   EDUCATION / INSTRUCTIONS:  BG monitoring instructions: Patient is instructed to check her blood sugars 4 times a day, before meals and bedtime.  Call [facility] if: BG persistently < 70 or > 300. . I reviewed the Rule of 15 for the treatment of hypoglycemia in detail with the patient. Literature supplied.   2) Diabetic complications:   Eye: Does not have known diabetic retinopathy. She was urged to schedule an appointment, she will have eye coverage in 02/2018  Neuro/ Feet: Does have known diabetic peripheral neuropathy.  Renal: Patient does not have known baseline CKD. She is not on an ACEI/ARB at present.   3) Lipids: Patient is on a statin. LDL 61 mg/dL    4) Hypertension: She is at goal of < 140/90 mmHg.  5) Tobacco abuse : Over 3 mins spent counseling about tobacco cessation.   F/u in 6 weeks     Signed electronically by: Mack Guise, MD  [facility]  [facility] [address] [place], [place] [address] Phone: [phone] FAX: [phone]   CC: Mikey Kirschner, [place] [address] Phone: [phone]  Fax: [phone]    Return to Endocrinology clinic as below: Future Appointments  Date Time Provider [place]  12/19/2017 11:00 AM Magdalen Spatz, NP [facility] None  12/19/2017 12:00 PM  [facility] 1 [facility]  12/25/2017  9:30 AM Rourk, Cristopher Estimable, MD [facility]  01/24/2018 10:30 AM Byrum, Rose Fillers, MD [facility] None

## 2017-12-18 ENCOUNTER — Encounter: Payer: Self-pay | Admitting: Internal Medicine

## 2017-12-18 ENCOUNTER — Ambulatory Visit (INDEPENDENT_AMBULATORY_CARE_PROVIDER_SITE_OTHER): Payer: Medicare Other | Admitting: Internal Medicine

## 2017-12-18 VITALS — BP 132/84 | HR 85 | Ht 63.0 in | Wt 152.8 lb

## 2017-12-18 DIAGNOSIS — E785 Hyperlipidemia, unspecified: Secondary | ICD-10-CM

## 2017-12-18 DIAGNOSIS — E1159 Type 2 diabetes mellitus with other circulatory complications: Secondary | ICD-10-CM

## 2017-12-18 MED ORDER — INSULIN ASPART 100 UNIT/ML FLEXPEN: 8.0000 [IU] | PEN_INJECTOR | Freq: Three times a day (TID) | SUBCUTANEOUS | 0 refills | Status: DC

## 2017-12-18 MED ORDER — INSULIN GLARGINE 100 UNIT/ML SOLOSTAR PEN: 30.0000 [IU] | PEN_INJECTOR | Freq: Every day | SUBCUTANEOUS | 2 refills | Status: DC

## 2017-12-18 MED ORDER — METFORMIN HCL ER 500 MG PO TB24: 1000.0000 mg | ORAL_TABLET | Freq: Two times a day (BID) | ORAL | 6 refills | Status: DC

## 2017-12-18 NOTE — Patient Instructions (Addendum)
-   Start Metformin 1 pill daily x 1 week, then increase to 1 pill twice a day x 1 week, then increase to 2 pills in AM and 1 pill in PM, finally 2 pills twice a day. - Decrease Lantus to 30 units daily  - Decrease Novolog to 8 units with meals  - Novolog correctional insulin: ADD extra units on insulin to your meal-time Novolog dose if your blood sugars are higher than . Use the scale below to help guide you:   Blood sugar before meal Number of units to inject  Less than 185 0 unit  185-  220 1 units  221 -  255 2 units  256 -  290 3 units  291 -  325 4 units    - If you are not going to eat a meal with Carbohydrates, please DO NOT take Novolog.  - If you start having nausea, diarrhea or vomiting, stop the Metformin and call us.    - HOW TO TREAT LOW BLOOD SUGARS (Blood sugar LESS THAN 70 MG/DL)  Please follow the RULE OF 15 for the treatment of hypoglycemia treatment (when your (blood sugars are less than 70 mg/dL)    STEP 1: Take 15 grams of carbohydrates when your blood sugar is low, which includes:   3-4 GLUCOSE TABS  OR  3-4 OZ OF JUICE OR REGULAR SODA OR  ONE TUBE OF GLUCOSE GEL     STEP 2: RECHECK blood sugar in 15 MINUTES STEP 3: If your blood sugar is still low at the 15 minute recheck --> then, go back to STEP 1 and treat AGAIN with another 15 grams of carbohydrates.

## 2017-12-19 ENCOUNTER — Other Ambulatory Visit: Payer: Self-pay | Admitting: Cardiovascular Disease

## 2017-12-19 ENCOUNTER — Inpatient Hospital Stay: Admission: RE | Admit: 2017-12-19 | Payer: Medicare Other | Source: Ambulatory Visit

## 2017-12-19 ENCOUNTER — Encounter: Payer: Medicare Other | Admitting: Acute Care

## 2017-12-19 NOTE — Telephone Encounter (Signed)
New message    *STAT* If patient is at the pharmacy, call can be transferred to refill team.   1. Which medications need to be refilled? (please list name of each medication and dose if known) rosuvastatin (CRESTOR) 40 MG tablet     clopidogrel (PLAVIX) 75 MG tablet     2. Which pharmacy/location (including street and city if local pharmacy) is medication to be sent to? [id number] - [place], [place] - [address]  3. Do they need a 30 day or 90 day supply? [place]

## 2017-12-20 MED ORDER — ROSUVASTATIN CALCIUM 40 MG PO TABS: ORAL_TABLET | ORAL | 1 refills | Status: DC

## 2017-12-20 MED ORDER — CLOPIDOGREL BISULFATE 75 MG PO TABS: 75.0000 mg | ORAL_TABLET | Freq: Every day | ORAL | 1 refills | Status: DC

## 2017-12-21 ENCOUNTER — Other Ambulatory Visit: Payer: Self-pay | Admitting: Emergency Medicine

## 2017-12-25 ENCOUNTER — Telehealth: Payer: Self-pay

## 2017-12-25 ENCOUNTER — Encounter: Payer: Self-pay | Admitting: Internal Medicine

## 2017-12-25 ENCOUNTER — Ambulatory Visit (INDEPENDENT_AMBULATORY_CARE_PROVIDER_SITE_OTHER): Payer: Medicare Other | Admitting: Internal Medicine

## 2017-12-25 VITALS — BP 119/78 | HR 102 | Temp 97.1°F | Ht 63.0 in | Wt 155.8 lb

## 2017-12-25 DIAGNOSIS — D508 Other iron deficiency anemias: Secondary | ICD-10-CM | POA: Diagnosis not present

## 2017-12-25 DIAGNOSIS — K219 Gastro-esophageal reflux disease without esophagitis: Secondary | ICD-10-CM | POA: Diagnosis not present

## 2017-12-25 DIAGNOSIS — R1310 Dysphagia, unspecified: Secondary | ICD-10-CM

## 2017-12-25 DIAGNOSIS — I6521 Occlusion and stenosis of right carotid artery: Secondary | ICD-10-CM | POA: Diagnosis not present

## 2017-12-25 DIAGNOSIS — R1319 Other dysphagia: Secondary | ICD-10-CM

## 2017-12-25 NOTE — Telephone Encounter (Signed)
Dr. Gwenlyn Found, Dr. Gala Romney would like to know if it's ok for pt to come off Plavix 5 days before procedure(TCS)? Please advise.

## 2017-12-25 NOTE — Progress Notes (Signed)
Primary Care Physician:  Mikey Kirschner, MD Primary Gastroenterologist:  Dr. Gala Romney  Pre-Procedure History & Physical: HPI:  Rachel [name] is a 67 y.o. female here for further evaluation of iron deficiency anemia.  Present patient recently admitted to the hospital with profound anemia with hemoglobin in the 6 range.  Studies indicated iron deficiency.  She was Hemoccult negative.  She was transfused 2 units of packed RBCs.  This lady is current GI symptoms amount to GERD well-controlled on Protonix and recurrent insidiously recurrent esophageal dysphagia to solid food.  She has had no melena or hematochezia.  Patient has diarrhea. Screening colonoscopy back in 2012 revealed only couple of diminutive polyps.  EGD at the same time demonstrated a normal esophagus-dysphagia at that time responded nicely to empiric passage of a 54 [place] dilator.  No history of GI bleeding.  Patient routinely takes a couple of [place] at bedtime 2-3 times a week for lower extremity neuropathy symptoms.  No family history of GI malignancy.  Past Medical History:  Diagnosis Date  . Asthma   . Coronary atherosclerosis of native coronary artery    DES x 2 (overlapping) RCA 03/2008 - patent 2011  . Diabetes type 2, controlled ([place]) 2000  . Essential hypertension, benign   . Fatty liver   . GERD (gastroesophageal reflux disease)   . Hyperlipidemia   . Microalbuminuria   . Obesity   . Odynophagia    Severe esophagitis on CT 05/2010, treated empirically with Diflucan  . PAD (peripheral artery disease) (HCC)    history of left greater than right lower 70 claudication  . Tubular adenoma of colon 07/2007   Multiple colonic polyps  . Venous stasis     Past Surgical History:  Procedure Laterality Date  . CARDIAC CATHETERIZATION     stents  . CHOLECYSTECTOMY N/A 07/30/2015   Procedure: LAPAROSCOPIC CHOLECYSTECTOMY;  Surgeon: Aviva Signs, MD;  Location: [facility];  Service: General;  Laterality: N/A;  .  COLONOSCOPY W/ POLYPECTOMY  07/2007   Tubular adenoma  . CORONARY STENT PLACEMENT    . CYSTECTOMY     Pilonidal  . ESOPHAGOGASTRODUODENOSCOPY  04/2010   RMR: Normal esophagus , Stomach D1 and D2   . LOWER EXTREMITY ANGIOGRAPHY N/A 03/13/2016   Procedure: Lower Extremity Angiography;  Surgeon: [name], MD;  Location: [facility];  Service: Cardiovascular;  Laterality: N/A;  . PERIPHERAL VASCULAR ATHERECTOMY  03/27/2016   Procedure: Peripheral Vascular Atherectomy;  Surgeon: [name], MD;  Location: [facility];  Service: Cardiovascular;;  Attempted atherectomy  . PERIPHERAL VASCULAR CATHETERIZATION Bilateral 09/28/2014   Procedure: Lower Extremity Angiography;  Surgeon: [name], MD;  Location: [facility];  Service: Cardiovascular;  Laterality: Bilateral;  . PERIPHERAL VASCULAR CATHETERIZATION N/A 09/28/2014   Procedure: Abdominal Aortogram;  Surgeon: [name], MD;  Location: [facility];  Service: Cardiovascular;  Laterality: N/A;  . PERIPHERAL VASCULAR CATHETERIZATION N/A 10/15/2014   Procedure: Lower Extremity Angiography;  Surgeon: [name], MD;  Location: [facility];  Service: Cardiovascular;  Laterality: N/A;  . PERIPHERAL VASCULAR CATHETERIZATION  10/15/2014   Procedure: Peripheral Vascular Intervention;  Surgeon: [name], MD;  Location: [facility];  Service: Cardiovascular;;  bilateral iliac stents  . PILONIDAL CYST / SINUS EXCISION    . TUBAL LIGATION Bilateral     Prior to Admission medications   Medication Sig Start Date End Date Taking? Authorizing  Provider  albuterol (PROAIR HFA) 108 (90 Base) MCG/ACT inhaler Inhale 2 puffs into the lungs every 4 (four) hours as needed for wheezing or shortness of breath. 05/10/17  Yes Collene Gobble, MD  aspirin EC 81 MG tablet Take 81 mg by mouth daily.     Yes [provider]  clopidogrel (PLAVIX) 75 MG tablet Take 1 tablet (75 mg total) by mouth  daily. 12/20/17  Yes [name], MD  fluticasone (FLONASE) 50 MCG/ACT nasal spray Place 2 sprays into both nostrils 2 (two) times daily. 12/21/17  Yes Collene Gobble, MD  gabapentin (NEURONTIN) 400 MG capsule TAKE ONE CAPSULE BY MOUTH AT BEDTIME AS DIRECTED 12/11/17  Yes Mikey Kirschner, MD  insulin aspart (NOVOLOG FLEXPEN) 100 UNIT/ML FlexPen Inject 8 Units into the skin 3 (three) times daily with meals. 12/18/17  Yes Shamleffer, Melanie Crazier, MD  Insulin Glargine (LANTUS) 100 UNIT/ML Solostar Pen Inject 30 Units into the skin daily. 12/18/17  Yes Shamleffer, Melanie Crazier, MD  levothyroxine (SYNTHROID, LEVOTHROID) 100 MCG tablet TAKE 1 TABLET BY MOUTH EVERY DAY BEFORE BREAKFAST Patient taking differently: Take 100 mcg by mouth daily before breakfast.  09/19/17  Yes Nida, Marella Chimes, MD  meclizine (ANTIVERT) 25 MG tablet Take 1 tablet (25 mg total) by mouth 3 (three) times daily as needed for dizziness. 05/23/17  Yes Mikey Kirschner, MD  metFORMIN (GLUCOPHAGE XR) 500 MG 24 hr tablet Take 2 tablets (1,000 mg total) by mouth 2 (two) times daily with a meal. Patient taking differently: Take 500 mg by mouth daily.  12/18/17  Yes Shamleffer, Melanie Crazier, MD  pantoprazole (PROTONIX) 40 MG tablet Take 1 tablet (40 mg total) by mouth daily. 07/26/17  Yes Mikey Kirschner, MD  potassium chloride SA (K-DUR,KLOR-CON) 20 MEQ tablet Take 1 tablet (20 mEq total) by mouth 2 (two) times daily. 11/06/17  Yes Daleen Bo, MD  rosuvastatin (CRESTOR) 40 MG tablet TAKE 1 TABLET EVERY DAY AT BEDTIME 12/20/17  Yes [name], MD  Tiotropium Bromide Monohydrate (SPIRIVA RESPIMAT) 2.5 MCG/ACT AERS Inhale 2 puffs into the lungs daily. 06/14/17  Yes Collene Gobble, MD    Allergies as of 12/25/2017 - Review Complete 12/25/2017  Allergen Reaction Noted  . Altace [ramipril] Cough 06/07/2012  . Biaxin [clarithromycin]  06/07/2012  . Niacin and related Other (See Comments) 06/07/2012  . Nsaids  Other (See Comments) 10/09/2014  . Tramadol  11/06/2017  . Adhesive [tape] Rash 03/22/2016    Family History  Problem Relation Age of Onset  . Bone cancer Father   . Heart disease Maternal Grandmother   . Hypertension Mother   . Diabetes Mother   . Colon cancer Neg Hx   . Liver disease Neg Hx   . Inflammatory bowel disease Neg Hx     Social History   Socioeconomic History  . Marital status: Divorced    Spouse name: Not on file  . Number of children: 1  . Years of education: Not on file  . Highest education level: Not on file  Occupational History  . Occupation: Tax inspector: [employer]  . Financial resource strain: Not on file  . Food insecurity:    Worry: Not on file    Inability: Not on file  . Transportation needs:    Medical: Not on file    Non-medical: Not on file  Tobacco Use  . Smoking status: Current Every Day Smoker    Packs/day:  0.25    Years: 50.00    Pack years: 12.50    Types: Cigarettes  . Smokeless tobacco: Never Used  Substance and Sexual Activity  . Alcohol use: No  . Drug use: No  . Sexual activity: Yes    Partners: Male    Birth control/protection: Condom    Comment: mutual friends  Lifestyle  . Physical activity:    Days per week: Not on file    Minutes per session: Not on file  . Stress: Not on file  Relationships  . Social connections:    Talks on phone: Not on file    Gets together: Not on file    Attends religious service: Not on file    Active member of club or organization: Not on file    Attends meetings of clubs or organizations: Not on file    Relationship status: Not on file  . Intimate partner violence:    Fear of current or ex partner: Not on file    Emotionally abused: Not on file    Physically abused: Not on file    Forced sexual activity: Not on file  Other Topics Concern  . Not on file  Social History Narrative  . Not on file    Review of Systems: See HPI, otherwise negative  ROS  Physical Exam: BP 119/78   Pulse (!) 102   Temp (!) 97.1 F (36.2 C) (Oral)   Ht 5\' 3"  (1.6 m)   Wt 155 lb 12.8 oz (70.7 kg)   BMI 27.60 kg/m  General:   Alert,  Well-developed, well-nourished, pleasant and cooperative in NAD Neck:  Supple; no masses or thyromegaly. No significant cervical adenopathy. Lungs:  Clear throughout to auscultation.   No wheezes, crackles, or rhonchi. No acute distress. Heart:  Regular rate and rhythm; no murmurs, clicks, rubs,  or gallops. Abdomen: Non-distended, normal bowel sounds.  Soft and nontender without appreciable mass or hepatosplenomegaly.  Pulses:  Normal pulses noted. Extremities:  Without clubbing or edema.  Impression/Plan: 67 year old lady with profound iron deficiency anemia.  Clinically, no evidence of GI bleeding.  She does have recurrent esophageal dysphagia in the setting of fairly well controlled GERD symptoms.  Does take ibuprofen multiple times weekly. Iron deficiency is related to GI bleeding differential is broad.  She is been on Plavix since 2012.  Colonoscopy is indicated on face value of iron deficiency anemia alone.  In addition, she needs to have an upper endoscopy to further evaluate/manage dysphagia.  Recommendations: I have offered the patient both an EGD with dilation as feasible/appropriate along with a diagnostic colonoscopy in the near future.  The risks, benefits, limitations, imponderables and alternatives regarding both EGD and colonoscopy have been reviewed with the patient. Questions have been answered. All parties agreeable.   We will adjust her diabetes regimen during the prep..  We will also touch base with Dr. Gwenlyn Found in regards to holding her Plavix for 5 days prior to the procedures.  Further recommendations to follow.    Notice: This dictation was prepared with Dragon dictation along with smaller phrase technology. Any transcriptional errors that result from this process are unintentional and may not be  corrected upon review.

## 2017-12-25 NOTE — Patient Instructions (Addendum)
Schedule a EGD with ED(dysphagia) and Dx Colonoscopy for iron deficiency  Anemia (propofol)  Low volume preparation  Hold metformin day before and day of procedures  Decrease novolog 3 units with meals day before procedure  Decrease Lantus to 15 units in the morning day before procedures  We will check with Cardiologist to get OK to come off Plavix 5 days before procedure  Further recommendations to follow        Thank you for entrusting me with your care. I appreciate the opportunity to create valuable relationships with patients and family members. You may receive a questionnaire in the mail regarding your visit here today.  It would be appreciated if you would take the time to return it.  If you were not completely satisfied with your experience, I would love to discuss any concerns with you.   Bridgette Habermann, M.D. [facility]

## 2017-12-25 NOTE — Telephone Encounter (Signed)
Okay to interrupt antiplatelet therapy for colonoscopy 

## 2017-12-26 ENCOUNTER — Other Ambulatory Visit: Payer: Self-pay | Admitting: *Deleted

## 2017-12-26 DIAGNOSIS — R1319 Other dysphagia: Secondary | ICD-10-CM

## 2017-12-26 DIAGNOSIS — R1310 Dysphagia, unspecified: Secondary | ICD-10-CM

## 2017-12-26 DIAGNOSIS — D508 Other iron deficiency anemias: Secondary | ICD-10-CM

## 2017-12-26 MED ORDER — NA SULFATE-K SULFATE-MG SULF 17.5-3.13-1.6 GM/177ML PO SOLN: 1.0000 | ORAL | 0 refills | Status: DC

## 2017-12-26 NOTE — Telephone Encounter (Signed)
Communication noted.  

## 2017-12-26 NOTE — Addendum Note (Signed)
Addended by: Inge Rise on: 12/26/2017 11:14 AM   Modules accepted: Orders

## 2017-12-26 NOTE — Telephone Encounter (Signed)
Spoke with patient and she is aware. I have mailed instructions to her. I will call her with pre-op appt. Prep sent into the pharmacy.

## 2017-12-26 NOTE — Telephone Encounter (Signed)
Pre-op scheduled for 02/08/18 at 10:00am. LMOVM. Letter mailed.

## 2017-12-26 NOTE — Telephone Encounter (Signed)
Noted. Routing message.  

## 2017-12-28 ENCOUNTER — Other Ambulatory Visit: Payer: Self-pay | Admitting: "Endocrinology

## 2017-12-31 ENCOUNTER — Telehealth: Payer: Self-pay

## 2017-12-31 ENCOUNTER — Other Ambulatory Visit: Payer: Self-pay | Admitting: "Endocrinology

## 2017-12-31 MED ORDER — PEG 3350-KCL-NA BICARB-NACL 420 G PO SOLR: 4000.0000 mL | Freq: Once | ORAL | 0 refills | Status: AC

## 2017-12-31 NOTE — Telephone Encounter (Signed)
Spoke with patient and is aware trylite has been sent to the pharmacy. I have mailed new instructions to her. She voiced understanding.

## 2017-12-31 NOTE — Telephone Encounter (Signed)
Pt call with concerns of her medication/solution needed for her TCS in 02/2018. It's $108.00 and that's to expensive for pt. She is going to call her insurance company to make sure that the price wont change after January since her plan will be changing and she will call our office back.

## 2017-12-31 NOTE — Telephone Encounter (Signed)
Pt returned call. Medication for her procedure isn't covered under her insurance and she's wondering if she can take another medication.

## 2017-12-31 NOTE — Addendum Note (Signed)
Addended by: Inge Rise on: 12/31/2017 11:53 AM   Modules accepted: Orders

## 2018-01-01 ENCOUNTER — Telehealth: Payer: Self-pay | Admitting: Internal Medicine

## 2018-01-01 NOTE — Telephone Encounter (Signed)
[facility] -Novolog 100mg , Lantus100 unit--been sign and faxed.

## 2018-01-01 NOTE — Telephone Encounter (Signed)
Zacharia from [employer] called re: status of PA for Novalog and Lantus. Please call ph# [phone] to advise. Press 0, then 3, then 3.

## 2018-01-09 ENCOUNTER — Other Ambulatory Visit: Payer: Self-pay | Admitting: Acute Care

## 2018-01-09 ENCOUNTER — Ambulatory Visit (INDEPENDENT_AMBULATORY_CARE_PROVIDER_SITE_OTHER): Payer: Medicare Other | Admitting: Acute Care

## 2018-01-09 ENCOUNTER — Ambulatory Visit (INDEPENDENT_AMBULATORY_CARE_PROVIDER_SITE_OTHER)
Admission: RE | Admit: 2018-01-09 | Discharge: 2018-01-09 | Disposition: A | Discharge status: Home / Self Care | Payer: Medicare Other | Source: Ambulatory Visit | Attending: Acute Care | Admitting: Acute Care

## 2018-01-09 ENCOUNTER — Encounter: Payer: Self-pay | Admitting: Acute Care

## 2018-01-09 VITALS — BP 140/70 | HR 98 | Ht 63.0 in | Wt 154.6 lb

## 2018-01-09 DIAGNOSIS — F17210 Nicotine dependence, cigarettes, uncomplicated: Secondary | ICD-10-CM

## 2018-01-09 DIAGNOSIS — Z122 Encounter for screening for malignant neoplasm of respiratory organs: Secondary | ICD-10-CM

## 2018-01-09 MED ORDER — FLUCONAZOLE 150 MG PO TABS: 150.0000 mg | ORAL_TABLET | Freq: Every day | ORAL | 0 refills | Status: AC

## 2018-01-09 MED ORDER — DOXYCYCLINE HYCLATE 100 MG PO TABS: 100.0000 mg | ORAL_TABLET | Freq: Two times a day (BID) | ORAL | 0 refills | Status: DC

## 2018-01-09 NOTE — Progress Notes (Signed)
Shared Decision Making Visit Lung Cancer Screening Program [phone])   Eligibility:  Age 67 y.o.  Pack Years Smoking History Calculation 50 pack year smoking history (# packs/per year x # years smoked)  Recent History of coughing up blood  no  Unexplained weight loss? no ( >Than 15 pounds within the last 6 months )  Prior History Lung / other cancer no (Diagnosis within the last 5 years already requiring surveillance chest CT Scans).  Smoking Status Current Smoker  Former Smokers: Years since quit: NA  Quit Date: NA  Visit Components:  Discussion included one or more decision making aids. yes  Discussion included risk/benefits of screening. yes  Discussion included potential follow up diagnostic testing for abnormal scans. yes  Discussion included meaning and risk of over diagnosis. yes  Discussion included meaning and risk of False Positives. yes  Discussion included meaning of total radiation exposure. yes  Counseling Included:  Importance of adherence to annual lung cancer LDCT screening. yes  Impact of comorbidities on ability to participate in the program. yes  Ability and willingness to under diagnostic treatment. yes  Smoking Cessation Counseling:  Current Smokers:   Discussed importance of smoking cessation. yes  Information about tobacco cessation classes and interventions provided to patient. yes  Patient provided with "ticket" for LDCT Scan. yes  Symptomatic Patient. no  Counseling  Diagnosis Code: Tobacco Use Z72.0  Asymptomatic Patient yes  Counseling (Intermediate counseling: > three minutes counseling) U0454  Former Smokers:   Discussed the importance of maintaining cigarette abstinence. yes  Diagnosis Code: Personal History of Nicotine Dependence. U98.119  Information about tobacco cessation classes and interventions provided to patient. Yes  Patient provided with "ticket" for LDCT Scan. yes  Written Order for Lung Cancer  Screening with LDCT placed in Epic. Yes (CT Chest Lung Cancer Screening Low Dose W/O CM) [id number] Z12.2-Screening of respiratory organs Z87.891-Personal history of nicotine dependence  I have spent 25 minutes of face to face time with Rachel [name] discussing the risks and benefits of lung cancer screening. We viewed a power point together that explained in detail the above noted topics. We paused at intervals to allow for questions to be asked and answered to ensure understanding.We discussed that the single most powerful action that she can take to decrease her risk of developing lung cancer is to quit smoking. We discussed whether or not she is ready to commit to setting a quit date. We discussed options for tools to aid in quitting smoking including nicotine replacement therapy, non-nicotine medications, support groups, Quit Smart classes, and behavior modification. We discussed that often times setting smaller, more achievable goals, such as eliminating 1 cigarette a day for a week and then 2 cigarettes a day for a week can be helpful in slowly decreasing the number of cigarettes smoked. This allows for a sense of accomplishment as well as providing a clinical benefit. I gave her the " Be Stronger Than Your Excuses" card with contact information for community resources, classes, free nicotine replacement therapy, and access to mobile apps, text messaging, and on-line smoking cessation help. I have also given her my card and contact information in the event she needs to contact me. We discussed the time and location of the scan, and that either Doroteo Glassman RN or I will call with the results within 24-48 hours of receiving them. I have offered her  a copy of the power point we viewed  as a resource in the event they need reinforcement of  the concepts we discussed today in the office. The patient verbalized understanding of all of  the above and had no further questions upon leaving the office. They have my  contact information in the event they have any further questions.  I spent 3 minutes counseling on smoking cessation and the health risks of continued tobacco abuse.  I explained to the patient that there has been a high incidence of coronary artery disease noted on these exams. I explained that this is a non-gated exam therefore degree or severity cannot be determined. This patient is on statin therapy. I have asked the patient to follow-up with their PCP regarding any incidental finding of coronary artery disease and management with diet or medication as their PCP  feels is clinically indicated. The patient verbalized understanding of the above and had no further questions upon completion of the visit.      Rachel Spatz, NP 01/09/2018 11:57 AM

## 2018-01-10 ENCOUNTER — Other Ambulatory Visit: Payer: Self-pay | Admitting: Acute Care

## 2018-01-10 DIAGNOSIS — Z122 Encounter for screening for malignant neoplasm of respiratory organs: Secondary | ICD-10-CM

## 2018-01-10 DIAGNOSIS — F17210 Nicotine dependence, cigarettes, uncomplicated: Secondary | ICD-10-CM

## 2018-01-10 MED ORDER — INSULIN LISPRO (1 UNIT DIAL) 100 UNIT/ML (KWIKPEN): 8.0000 [IU] | PEN_INJECTOR | Freq: Three times a day (TID) | SUBCUTANEOUS | 6 refills | Status: DC

## 2018-01-10 MED ORDER — BASAGLAR KWIKPEN 100 UNIT/ML ~~LOC~~ SOPN: 30.0000 [IU] | PEN_INJECTOR | Freq: Every day | SUBCUTANEOUS | 6 refills | Status: DC

## 2018-01-10 NOTE — Telephone Encounter (Signed)
Until we hear from her about the insurance formulary, I went ahead and sent a prescription for basaglar (to replace lantus ) and humalog (to replace novolog)  Lets see what the Rx people say.  Thanks

## 2018-01-10 NOTE — Telephone Encounter (Signed)
Spoke with Amillio at [facility] regarding patients Novalog and Lantus prescriptions. Amillio stated request was denied by insurance. Will contact patient and inform her of the same and advice to reach out to insurance to see what is covered. MD Shamleffer made aware.

## 2018-01-11 NOTE — Telephone Encounter (Signed)
Spoke to robin at [facility] to f/u on insulin covered under insurance and received conformation that patient currently prescribed insulin is covered.

## 2018-01-19 ENCOUNTER — Other Ambulatory Visit: Payer: Self-pay | Admitting: "Endocrinology

## 2018-01-24 ENCOUNTER — Ambulatory Visit (INDEPENDENT_AMBULATORY_CARE_PROVIDER_SITE_OTHER): Payer: Medicare Other | Admitting: Emergency Medicine

## 2018-01-24 ENCOUNTER — Encounter: Payer: Self-pay | Admitting: Emergency Medicine

## 2018-01-24 VITALS — BP 126/72 | HR 84 | Ht 63.0 in | Wt 153.6 lb

## 2018-01-24 DIAGNOSIS — J449 Chronic obstructive pulmonary disease, unspecified: Secondary | ICD-10-CM

## 2018-01-24 DIAGNOSIS — J301 Allergic rhinitis due to pollen: Secondary | ICD-10-CM

## 2018-01-24 DIAGNOSIS — R918 Other nonspecific abnormal finding of lung field: Secondary | ICD-10-CM | POA: Insufficient documentation

## 2018-01-24 DIAGNOSIS — F17200 Nicotine dependence, unspecified, uncomplicated: Secondary | ICD-10-CM

## 2018-01-24 DIAGNOSIS — I6521 Occlusion and stenosis of right carotid artery: Secondary | ICD-10-CM

## 2018-01-24 DIAGNOSIS — R911 Solitary pulmonary nodule: Secondary | ICD-10-CM | POA: Diagnosis not present

## 2018-01-24 MED ORDER — TIOTROPIUM BROMIDE MONOHYDRATE 18 MCG IN CAPS: 18.0000 ug | ORAL_CAPSULE | Freq: Every day | RESPIRATORY_TRACT | 6 refills | Status: DC

## 2018-01-24 NOTE — Assessment & Plan Note (Signed)
Fairly well-controlled.  She is interested in seeing if there is a lower tier substitute for Spiriva Respimat.  I will temporarily changed to Spiriva HandiHaler, get her formulary and assess the alternatives.  Continue albuterol as needed

## 2018-01-24 NOTE — Patient Instructions (Addendum)
We will plan to repeat your CT scan of the chest in March to look for stability in your pulmonary nodules. We will try substituting Spiriva HandiHaler for your current Spiriva Respimat.  Do 1 inhalation once daily. Try to get [place] your insurance formulary so that we can consider other substitutions for your Spiriva Respimat that are more cost effective. Keep your albuterol available to use if needed for shortness of breath, chest tightness, wheezing. Congratulations on decreasing your smoking.  We agreed today that you would cut down to 2 cigarettes daily by the next time we see each other. Continue your Flonase 2 sprays each nostril 1-2 times daily. Try starting nasal saline washes once daily. Follow with Dr Lamonte Sakai in 3 months or sooner if you have any problems.  We will review your CT scan of the chest at that time.

## 2018-01-24 NOTE — Progress Notes (Signed)
 Subjective:    Patient ID: Rachel [name], female    DOB: 09/03/1950, 67 y.o.   MRN: [id number]  COPD  She complains of cough and shortness of breath. There is no wheezing. Associated symptoms include appetite change, sneezing and trouble swallowing. Pertinent negatives include no ear pain, fever, headaches, postnasal drip, rhinorrhea or sore throat. Her past medical history is significant for COPD.   67-year-old smoker (50 pack years) with a history of hypertension, coronary artery disease, peripheral vascular disease, diabetes, allergic rhinitis.  She is referred today by Dr. Luking for evaluation of persistent cough, dyspnea and probable COPD.  She reports that she started to notice exertional SOB with previously easy tasks. She has had a worsening of a chronic cough and sinus drainage, hoarse voice. She has controlled GERD, has some occasional dysphagia. She has  A lot of nasal congestion.   Pulmonary function testing was done 03/29/17 and I have reviewed.  This shows moderate obstruction with out a bronchodilator response, normal lung volumes, severely decreased diffusion capacity that does not correct when adjusted for alveolar volume.  ROV 06/26/17 --this is a follow-up visit for patient with a history of tobacco use and COPD with moderate obstruction noted on pulmonary function testing.  At her last visit we talked about smoking cessation and also started her on Spiriva Respimat to see if she would benefit.  A walking oximetry did not show any desaturation at that visit. She believes her breathing is better. She still has a lot of nasal drainage and congestion. She has albuterol, uses it about . Smoking about 5 cigarettes a day. She has seen S [facility] regarding LDCT, planning to start medicare in Sept and then scan in Oct.   ROV 01/24/18 --[name] is 67, has COPD and history of tobacco use.  Also nasal congestion and allergic rhinitis, GERD.  We are managing her on Spiriva Respimat.  She  uses albuterol approximately 1-2x a month.  She has been working on decreasing her smoking and is currently at 3 cigarettes per day.  She underwent a low-dose CT scan for lung cancer screening on 01/09/2018 and I have reviewed.  This shows multiple bilateral nodules that measure up to 11 mm in the left upper lobe with some adjacent ill-defined groundglass. We treated her empirically with doxycycline. She had less mucous and cough while she was on it. She is on flonase, sometimes uses bid. Not on anti-histamine - hasn' t really benefited before. She has never done [place]. Cough is her most bothersome sx right now.    Review of Systems  Constitutional: Positive for appetite change. Negative for fever and unexpected weight change.  HENT: Positive for congestion, sneezing, trouble swallowing and voice change. Negative for dental problem, ear pain, nosebleeds, postnasal drip, rhinorrhea, sinus pressure and sore throat.   Eyes: Negative for redness and itching.  Respiratory: Positive for cough and shortness of breath. Negative for chest tightness and wheezing.   Cardiovascular: Negative for palpitations and leg swelling.  Gastrointestinal: Negative for nausea and vomiting.  Genitourinary: Negative for dysuria.  Musculoskeletal: Negative for joint swelling.  Skin: Negative for rash.  Neurological: Negative for headaches.  Hematological: Does not bruise/bleed easily.  Psychiatric/Behavioral: Negative for dysphoric mood. The patient is not nervous/anxious.    Past Medical History:  Diagnosis Date  . Asthma   . Coronary atherosclerosis of native coronary artery    DES x 2 (overlapping) RCA 03/2008 - patent 2011  . Diabetes type 2, controlled (HCC)   2000  . Essential hypertension, benign   . Fatty liver   . GERD (gastroesophageal reflux disease)   . Hyperlipidemia   . Microalbuminuria   . Obesity   . Odynophagia    Severe esophagitis on CT 05/2010, treated empirically with Diflucan  . PAD (peripheral  artery disease) (HCC)    history of left greater than right lower 70 claudication  . Tubular adenoma of colon 07/2007   Multiple colonic polyps  . Venous stasis      Family History  Problem Relation Age of Onset  . Bone cancer Father   . Heart disease Maternal Grandmother   . Hypertension Mother   . Diabetes Mother   . Colon cancer Neg Hx   . Liver disease Neg Hx   . Inflammatory bowel disease Neg Hx      Social History   Socioeconomic History  . Marital status: Divorced    Spouse name: Not on file  . Number of children: 1  . Years of education: Not on file  . Highest education level: Not on file  Occupational History  . Occupation: Billing specialist    Employer: [employer]  Social Needs  . Financial resource strain: Not on file  . Food insecurity:    Worry: Not on file    Inability: Not on file  . Transportation needs:    Medical: Not on file    Non-medical: Not on file  Tobacco Use  . Smoking status: Current Every Day Smoker    Packs/day: 0.25    Years: 50.00    Pack years: 12.50    Types: Cigarettes  . Smokeless tobacco: Never Used  Substance and Sexual Activity  . Alcohol use: No  . Drug use: No  . Sexual activity: Yes    Partners: Male    Birth control/protection: Condom    Comment: mutual friends  Lifestyle  . Physical activity:    Days per week: Not on file    Minutes per session: Not on file  . Stress: Not on file  Relationships  . Social connections:    Talks on phone: Not on file    Gets together: Not on file    Attends religious service: Not on file    Active member of club or organization: Not on file    Attends meetings of clubs or organizations: Not on file    Relationship status: Not on file  . Intimate partner violence:    Fear of current or ex partner: Not on file    Emotionally abused: Not on file    Physically abused: Not on file    Forced sexual activity: Not on file  Other Topics Concern  . Not on file  Social History Narrative   . Not on file     Allergies  Allergen Reactions  . Altace [Ramipril] Cough  . Biaxin [Clarithromycin]     Fatigue  . Niacin And Related Other (See Comments)    Flush - felt like on fire.  . Nsaids Other (See Comments)    feverish  . Tramadol     Lethargic   . Adhesive [Tape] Rash    Paper tape is ok     Outpatient Medications Prior to Visit  Medication Sig Dispense Refill  . albuterol (PROAIR HFA) 108 (90 Base) MCG/ACT inhaler Inhale 2 puffs into the lungs every 4 (four) hours as needed for wheezing or shortness of breath. 1 Inhaler 5  . aspirin EC 81 MG tablet Take 81   mg by mouth daily.      . clopidogrel (PLAVIX) 75 MG tablet Take 1 tablet (75 mg total) by mouth daily. 90 tablet 1  . fluticasone (FLONASE) 50 MCG/ACT nasal spray Place 2 sprays into both nostrils 2 (two) times daily. 16 g 2  . gabapentin (NEURONTIN) 400 MG capsule TAKE ONE CAPSULE BY MOUTH AT BEDTIME AS DIRECTED 90 capsule 1  . Insulin Glargine (BASAGLAR KWIKPEN) 100 UNIT/ML SOPN Inject 0.3 mLs (30 Units total) into the skin daily. 15 mL 6  . insulin lispro (HUMALOG KWIKPEN) 100 UNIT/ML KwikPen Inject 0.08 mLs (8 Units total) into the skin 3 (three) times daily. Max daily dose of 45 units 15 mL 6  . levothyroxine (SYNTHROID, LEVOTHROID) 100 MCG tablet TAKE 1 TABLET BY MOUTH EVERY DAY BEFORE BREAKFAST (Patient taking differently: Take 100 mcg by mouth daily before breakfast. ) 90 tablet 0  . meclizine (ANTIVERT) 25 MG tablet Take 1 tablet (25 mg total) by mouth 3 (three) times daily as needed for dizziness. 30 tablet 2  . metFORMIN (GLUCOPHAGE XR) 500 MG 24 hr tablet Take 2 tablets (1,000 mg total) by mouth 2 (two) times daily with a meal. (Patient taking differently: Take 500 mg by mouth daily. ) 120 tablet 6  . Na Sulfate-K Sulfate-Mg Sulf 17.5-3.13-1.6 GM/177ML SOLN Take 1 kit by mouth as directed. 1 Bottle 0  . pantoprazole (PROTONIX) 40 MG tablet Take 1 tablet (40 mg total) by mouth daily. 90 tablet 1  .  potassium chloride SA (K-DUR,KLOR-CON) 20 MEQ tablet Take 1 tablet (20 mEq total) by mouth 2 (two) times daily. 30 tablet 0  . rosuvastatin (CRESTOR) 40 MG tablet TAKE 1 TABLET EVERY DAY AT BEDTIME 90 tablet 1  . Tiotropium Bromide Monohydrate (SPIRIVA RESPIMAT) 2.5 MCG/ACT AERS Inhale 2 puffs into the lungs daily. 1 Inhaler 5  . doxycycline (VIBRA-TABS) 100 MG tablet Take 1 tablet (100 mg total) by mouth 2 (two) times daily. 14 tablet 0   No facility-administered medications prior to visit.         Objective:   Physical Exam Vitals:   01/24/18 1032  BP: 126/72  Pulse: 84  SpO2: 97%  Weight: 153 lb 9.6 oz (69.7 kg)  Height: 5' 3" (1.6 m)    Gen: Pleasant, well-nourished, in no distress,  normal affect  ENT: No lesions,  mouth clear,  oropharynx clear, no postnasal drip, strong voice  Neck: No JVD, no stridor  Lungs: No use of accessory muscles,clear without rales or rhonchi  Cardiovascular: RRR, heart sounds normal, no murmur or gallops, no peripheral edema  Musculoskeletal: No deformities, no cyanosis or clubbing  Neuro: alert, non focal  Skin: Warm, no lesions or rashes      Assessment & Plan:  COPD (chronic obstructive pulmonary disease) (HCC) Fairly well-controlled.  She is interested in seeing if there is a lower tier substitute for Spiriva Respimat.  I will temporarily changed to Spiriva HandiHaler, get her formulary and assess the alternatives.  Continue albuterol as needed  Allergic rhinitis Only marginal control and it is contributing to her cough.  She has not responded to antihistamines in the past, she is on fluticasone nasal spray.  I have asked her to try using nasal saline rinses  TOBACCO ABUSE Discussed cessation further with her today.  Congratulated her efforts.  She is going to cut down to 2 cigarettes daily by our next visit.  Soon we should be able to contemplate setting a quit date.  Pulmonary nodules   Noted on lung cancer screening CT scan from  01/2018.  There was an ill-defined left upper lobe nodular area that we treated empirically with doxycycline.  I will perform a super D CT in March, look for interval change and consider biopsy strategies if the lesion is evolving.  Baltazar Apo, MD, PhD 01/24/2018, 11:11 AM [facility] [phone] or if no answer [phone]

## 2018-01-24 NOTE — Assessment & Plan Note (Signed)
Noted on lung cancer screening CT scan from 01/2018.  There was an ill-defined left upper lobe nodular area that we treated empirically with doxycycline.  I will perform a super D CT in March, look for interval change and consider biopsy strategies if the lesion is evolving.

## 2018-01-24 NOTE — Assessment & Plan Note (Signed)
Discussed cessation further with her today.  Congratulated her efforts.  She is going to cut down to 2 cigarettes daily by our next visit.  Soon we should be able to contemplate setting a quit date.

## 2018-01-24 NOTE — Assessment & Plan Note (Signed)
Only marginal control and it is contributing to her cough.  She has not responded to antihistamines in the past, she is on fluticasone nasal spray.  I have asked her to try using nasal saline rinses

## 2018-01-29 ENCOUNTER — Other Ambulatory Visit: Payer: Self-pay | Admitting: Family Medicine

## 2018-02-04 ENCOUNTER — Encounter: Payer: Self-pay | Admitting: Internal Medicine

## 2018-02-04 ENCOUNTER — Ambulatory Visit (INDEPENDENT_AMBULATORY_CARE_PROVIDER_SITE_OTHER): Payer: Medicare Other | Admitting: Internal Medicine

## 2018-02-04 VITALS — BP 122/72 | HR 83 | Ht 63.0 in | Wt 155.0 lb

## 2018-02-04 DIAGNOSIS — Z794 Long term (current) use of insulin: Secondary | ICD-10-CM | POA: Diagnosis not present

## 2018-02-04 DIAGNOSIS — E1142 Type 2 diabetes mellitus with diabetic polyneuropathy: Secondary | ICD-10-CM

## 2018-02-04 DIAGNOSIS — E1159 Type 2 diabetes mellitus with other circulatory complications: Secondary | ICD-10-CM

## 2018-02-04 MED ORDER — INSULIN GLARGINE 100 UNIT/ML SOLOSTAR PEN: 28.0000 [IU] | PEN_INJECTOR | Freq: Every day | SUBCUTANEOUS | 6 refills | Status: DC

## 2018-02-04 MED ORDER — SEMAGLUTIDE(0.25 OR 0.5MG/DOS) 2 MG/1.5ML ~~LOC~~ SOPN: 0.2500 mg | PEN_INJECTOR | SUBCUTANEOUS | 11 refills | Status: DC

## 2018-02-04 MED ORDER — INSULIN ASPART 100 UNIT/ML FLEXPEN: 7.0000 [IU] | PEN_INJECTOR | Freq: Three times a day (TID) | SUBCUTANEOUS | 3 refills | Status: DC

## 2018-02-04 MED ORDER — METFORMIN HCL ER 500 MG PO TB24: 500.0000 mg | ORAL_TABLET | Freq: Two times a day (BID) | ORAL | 11 refills | Status: DC

## 2018-02-04 NOTE — Patient Instructions (Addendum)
-   Decrease Lantus to 28 units daily  - Decrease Novolog to 7 units with each meal  - Continue Metformin XR 500 mg One tablet twice a day with meals - Start Ozempic at 0.25 mg  Injection  Once weekly   -HOW TO TREAT LOW BLOOD SUGARS (Blood sugar LESS THAN 70 MG/DL)  Please follow the RULE OF 15 for the treatment of hypoglycemia treatment (when your (blood sugars are less than 70 mg/dL)    STEP 1: Take 15 grams of carbohydrates when your blood sugar is low, which includes:   3-4 GLUCOSE TABS  OR  3-4 OZ OF JUICE OR REGULAR SODA OR  ONE TUBE OF GLUCOSE GEL     STEP 2: RECHECK blood sugar in 15 MINUTES STEP 3: If your blood sugar is still low at the 15 minute recheck --> then, go back to STEP 1 and treat AGAIN with another 15 grams of carbohydrates.    Choose healthy, lower carb lower calorie snacks: toss salad, cooked vegetables, cottage cheese, peanut butter, low fat cheese / string cheese, lower sodium deli meat, tuna salad or chicken salad

## 2018-02-04 NOTE — Progress Notes (Signed)
Name: Rachel [name]  Age/ Sex: 67 y.o., female   MRN/ DOB: [id number], 02-05-1951     PCP: Mikey Kirschner, MD   Reason for Endocrinology Evaluation: Type 2 Diabetes Mellitus  Initial Endocrine Consultative Visit: 12/18/2017    PATIENT IDENTIFIER: [name] is a 67 y.o. female with a past medical history of Hypothyroidism, DM, CAD (S/P PCI), PVD, COPD and Dyslipidemia . The patient has followed with Endocrinology clinic since 12/18/17 for consultative assistance with management of her diabetes.  DIABETIC HISTORY:  Ms. Coachman was diagnosed with T2DM for ~ 20 yrs ago, She was initially on Metformin and insulni therapy was added years later. Her hemoglobin A1c has ranged from 6.0% in 2016, peaking at 12.3% in 2017.  On her initial presentation she was not on Metformin, as she just had a hospital admission for dehydration and it was stopped due to GFR at 37 mL/min/m2 but this was restarted after her renal function returned to normal.   SUBJECTIVE:   During the last visit (12/18/17): Her A1c was 8.0%. We restarted Metformin and decreased Lantus and Novolog.   Today (02/04/2018): Ms. Willeford is here for a 6 week follow up on her diabetes management.  She checks her blood sugars multiple times daily through the [employer]. The patient has had hypoglycemic episodes since the last clinic visit, which typically occurs 2x / month- most often occuring during the day. The patient is symptomatic with these episodes. Otherwise, the patient has not required any recent emergency interventions for hypoglycemia and has not had recent hospitalizations secondary to hyper or hypoglycemic episodes.   Forgets to take it 2 times a week    ROS: As per HPI and as detailed below: Review of Systems  Constitutional: Negative for fever.  HENT: Positive for congestion. Negative for sore throat.   Respiratory:  Positive for cough. Negative for shortness of breath.       HOME DIABETES REGIMEN:  Lantus 30 units daily Novolog 8 units TID QAC Metformin XR 500 mg Two tabs BID - Pt taking 1 tab BID      CONTINUOUS GLUCOSE MONITORING RECORD INTERPRETATION    Dates of Recording: 12/3-12/30/19  Sensor description: [employer]   Results statistics:   CGM use % of time 95  Average and SD 200/74.2  Time in range       41 %  % Time Above 180 57  % Time above 250 21  % Time Below target 2    Glycemic patterns summary: hyperglycemia during the day   Hyperglycemic episodes  Meal times when she skips prandial insulin   Hypoglycemic episodes occurred during the day , after bolusing for postprandial hyperglycemia   Overnight periods: Stable        HISTORY:  Past Medical History:  Past Medical History:  Diagnosis Date  . Asthma   . Coronary atherosclerosis of native coronary artery    DES x 2 (overlapping) RCA 03/2008 - patent 2011  . Diabetes type 2, controlled ([place]) 2000  . Essential hypertension, benign   . Fatty liver   . GERD (gastroesophageal reflux disease)   . Hyperlipidemia   . Microalbuminuria   . Obesity   . Odynophagia    Severe esophagitis on CT 05/2010, treated empirically with Diflucan  . PAD (peripheral artery disease) (HCC)    history of left greater than right lower 70 claudication  . Tubular adenoma of colon 07/2007   Multiple colonic polyps  . Venous stasis  Past Surgical History:  Past Surgical History:  Procedure Laterality Date  . CARDIAC CATHETERIZATION     stents  . CHOLECYSTECTOMY N/A 07/30/2015   Procedure: LAPAROSCOPIC CHOLECYSTECTOMY;  Surgeon: Aviva Signs, MD;  Location: [facility];  Service: General;  Laterality: N/A;  . COLONOSCOPY W/ POLYPECTOMY  07/2007   Tubular adenoma  . CORONARY STENT PLACEMENT    . CYSTECTOMY     Pilonidal  . ESOPHAGOGASTRODUODENOSCOPY  04/2010   RMR: Normal esophagus , Stomach D1 and D2   . LOWER EXTREMITY  ANGIOGRAPHY N/A 03/13/2016   Procedure: Lower Extremity Angiography;  Surgeon: [name], MD;  Location: [facility] [facility];  Service: Cardiovascular;  Laterality: N/A;  . PERIPHERAL VASCULAR ATHERECTOMY  03/27/2016   Procedure: Peripheral Vascular Atherectomy;  Surgeon: [name], MD;  Location: [facility];  Service: Cardiovascular;;  Attempted atherectomy  . PERIPHERAL VASCULAR CATHETERIZATION Bilateral 09/28/2014   Procedure: Lower Extremity Angiography;  Surgeon: [name], MD;  Location: [facility];  Service: Cardiovascular;  Laterality: Bilateral;  . PERIPHERAL VASCULAR CATHETERIZATION N/A 09/28/2014   Procedure: Abdominal Aortogram;  Surgeon: [name], MD;  Location: [facility];  Service: Cardiovascular;  Laterality: N/A;  . PERIPHERAL VASCULAR CATHETERIZATION N/A 10/15/2014   Procedure: Lower Extremity Angiography;  Surgeon: [name], MD;  Location: [facility];  Service: Cardiovascular;  Laterality: N/A;  . PERIPHERAL VASCULAR CATHETERIZATION  10/15/2014   Procedure: Peripheral Vascular Intervention;  Surgeon: [name], MD;  Location: [facility];  Service: Cardiovascular;;  bilateral iliac stents  . PILONIDAL CYST / SINUS EXCISION    . TUBAL LIGATION Bilateral     Social History:  reports that she has been smoking cigarettes. She has a 12.50 pack-year smoking history. She has never used smokeless tobacco. She reports that she does not drink alcohol or use drugs. Family History:  Family History  Problem Relation Age of Onset  . Bone cancer Father   . Heart disease Maternal Grandmother   . Hypertension Mother   . Diabetes Mother   . Colon cancer Neg Hx   . Liver disease Neg Hx   . Inflammatory bowel disease Neg Hx      HOME MEDICATIONS: Allergies as of 02/04/2018      Reactions   Altace [ramipril] Cough   Biaxin [clarithromycin] Other (See Comments)   Fatigue   Niacin And Related Other (See Comments)    Flush - felt like on fire.   Nsaids Other (See Comments)   feverish   Tramadol Other (See Comments)   Lethargic    Adhesive [tape] Rash, Other (See Comments)   Paper tape is ok      Medication List       Accurate as of February 04, 2018  4:38 PM. Always use your most recent med list.        albuterol 108 (90 Base) MCG/ACT inhaler Commonly known as:  PROAIR HFA Inhale 2 puffs into the lungs every 4 (four) hours as needed for wheezing or shortness of breath.   aspirin EC 81 MG tablet Take 81 mg by mouth daily.   clopidogrel 75 MG tablet Commonly known as:  PLAVIX Take 1 tablet (75 mg total) by mouth daily.   fluticasone 50 MCG/ACT nasal spray Commonly known as:  FLONASE Place 2 sprays into both nostrils 2 (two) times daily.   gabapentin 400 MG capsule Commonly known as:  NEURONTIN TAKE ONE CAPSULE BY MOUTH  AT BEDTIME AS DIRECTED   hydrochlorothiazide 12.5 MG capsule Commonly known as:  MICROZIDE TAKE 1 CAPSULE BY MOUTH EVERY DAY   insulin aspart 100 UNIT/ML FlexPen Commonly known as:  NOVOLOG FLEXPEN Inject 7 Units into the skin 3 (three) times daily with meals.   Insulin Glargine 100 UNIT/ML Solostar Pen Commonly known as:  LANTUS SOLOSTAR Inject 28 Units into the skin daily.   levothyroxine 100 MCG tablet Commonly known as:  SYNTHROID, LEVOTHROID TAKE 1 TABLET BY MOUTH EVERY DAY BEFORE BREAKFAST   meclizine 25 MG tablet Commonly known as:  ANTIVERT Take 1 tablet (25 mg total) by mouth 3 (three) times daily as needed for dizziness.   metFORMIN 500 MG 24 hr tablet Commonly known as:  GLUCOPHAGE XR Take 1 tablet (500 mg total) by mouth 2 (two) times daily with a meal.   Na Sulfate-K Sulfate-Mg Sulf 17.5-3.13-1.6 GM/177ML Soln Take 1 kit by mouth as directed.   pantoprazole 40 MG tablet Commonly known as:  PROTONIX Take 1 tablet (40 mg total) by mouth daily.   potassium chloride SA 20 MEQ tablet Commonly known as:  K-DUR,KLOR-CON Take 1 tablet (20  mEq total) by mouth 2 (two) times daily.   rosuvastatin 40 MG tablet Commonly known as:  CRESTOR TAKE 1 TABLET EVERY DAY AT BEDTIME   Semaglutide(0.25 or 0.5MG/DOS) 2 MG/1.5ML Sopn Commonly known as:  OZEMPIC (0.25 OR 0.5 MG/DOSE) Inject 0.25 mg into the skin once a week.   tiotropium 18 MCG inhalation capsule Commonly known as:  SPIRIVA Place 1 capsule (18 mcg total) into inhaler and inhale daily.        OBJECTIVE:   Vital Signs: BP 122/72 (BP Location: Right Arm, Patient Position: Sitting, Cuff Size: Normal)   Pulse 83   Ht 5' 3"  (1.6 m)   Wt 155 lb (70.3 kg)   SpO2 98%   BMI 27.46 kg/m   Wt Readings from Last 3 Encounters:  02/04/18 155 lb (70.3 kg)  01/24/18 153 lb 9.6 oz (69.7 kg)  01/09/18 154 lb 9.6 oz (70.1 kg)     Exam: General: Pt appears well and is in NAD  Neck: General: Supple without adenopathy. Thyroid: Thyroid size normal.  No goiter or nodules appreciated. No thyroid bruit.  Lungs: Clear with good BS bilat with no rales, rhonchi, or wheezes  Heart: RRR with normal S1 and S2 and no gallops; no murmurs; no rub  Abdomen: Normoactive bowel sounds, soft, nontender, without masses or organomegaly palpable  Extremities: No pretibial edema.  Skin: Normal texture and temperature to palpation. No rash noted.  Neuro: MS is good with appropriate affect, pt is alert and Ox3    DM Foot Exam 12/18/17 The skin of the feet is intact without sores or ulcerations. Plantar callous formation noted bilaterally. Toe nails are thickened and elongated The pedal pulses are 2+ on right and 2+ on left. The sensation is intact to a screening 5.07, 10 gram monofilament bilaterally         DATA REVIEWED:  Lab Results  Component Value Date   HGBA1C 8.0 (H) 11/13/2017   HGBA1C 7.6 (H) 05/10/2017   HGBA1C 8.8 (H) 02/08/2017   Lab Results  Component Value Date   MICROALBUR 1.4 02/08/2017   LDLCALC 61 12/07/2017   CREATININE 0.98 11/21/2017   Lab Results  Component  Value Date   MICRALBCREAT 67 (H) 02/08/2017    Lab Results  Component Value Date   CHOL 132 12/07/2017   HDL 52 12/07/2017  [place] 61 12/07/2017   TRIG 93 12/07/2017   CHOLHDL 2.5 12/07/2017         ASSESSMENT / PLAN / RECOMMENDATIONS:   1) Type 2 Diabetes Mellitus, Sub-Optimally controlled, With neuropathic complications, CAD and PVD - Most recent A1c of 8.0 %. Goal A1c < 7.5 %.   - Her hyperglycemia stems from the fact that she does not take Novolog with her meals consistently. She skips it quite often which results in hyperglycemia after her meals.  - Her hypoglycemia happens because she boluses with Novolog for a post-prandial BG, which I have discouraged from doing.  - We discussed adding a GLP- 1 agonist, which she brought her formulary today, to decrease insulin requirements. We discussed GI side effects of nausea and diarrhea  - She is unable to go up on the Metformin to 2000 mg . Will stay at 1000 mg daily   - Discussed pharmacokinetics of basal/bolus insulin and the importance of taking prandial insulin with meals.  - We also discussed avoiding sugar-sweetened beverages and snacks, when possible.      MEDICATIONS:  Decrease Lantus to 28 units daily  Decrease Novolog to 7 units TID QAC  Start Ozempic 0.25 mg weekly   Continue Metformin XR 500 mg BID   EDUCATION / INSTRUCTIONS:  BG monitoring instructions: Patient is instructed to check her blood sugars 4 times a day, before meals and bedtime.  Call [facility] if: BG persistently < 70 or > 300. . I reviewed the Rule of 15 for the treatment of hypoglycemia in detail with the patient. Literature supplied.    F/U in 8 weeks    Signed electronically by: Mack Guise, MD  [facility]  [facility] [address] [place], [place] [address] Phone: [phone] FAX: [phone]   CC: Mikey Kirschner, [place] [address] Phone: [phone]  Fax: [phone]  Return to Endocrinology clinic as below: Future Appointments  Date Time Provider [place]  02/08/2018 10:00 AM [facility] PAT 1 [facility] None  04/18/2018  9:00 AM [facility]-CT 1 [facility] [facility]  05/07/2018 10:00 AM [place], Rose Fillers, MD [facility] None

## 2018-02-07 ENCOUNTER — Encounter (HOSPITAL_COMMUNITY): Payer: Self-pay

## 2018-02-08 ENCOUNTER — Encounter (HOSPITAL_COMMUNITY)
Admission: RE | Admit: 2018-02-08 | Discharge: 2018-02-08 | Disposition: A | Discharge status: Home / Self Care | Payer: Medicare Other | Source: Ambulatory Visit | Attending: Internal Medicine | Admitting: Internal Medicine

## 2018-02-14 ENCOUNTER — Encounter (HOSPITAL_COMMUNITY)
Admission: RE | Disposition: A | Discharge status: Home / Self Care | Payer: Self-pay | Source: Home / Self Care | Attending: Internal Medicine

## 2018-02-14 ENCOUNTER — Ambulatory Visit (HOSPITAL_COMMUNITY): Payer: Medicare HMO | Admitting: Anesthesiology

## 2018-02-14 ENCOUNTER — Other Ambulatory Visit: Payer: Self-pay

## 2018-02-14 ENCOUNTER — Encounter (HOSPITAL_COMMUNITY): Payer: Self-pay | Admitting: *Deleted

## 2018-02-14 ENCOUNTER — Ambulatory Visit (HOSPITAL_COMMUNITY)
Admission: RE | Admit: 2018-02-14 | Discharge: 2018-02-14 | Disposition: A | Discharge status: Home / Self Care | Payer: Medicare HMO | Attending: Internal Medicine | Admitting: Internal Medicine

## 2018-02-14 DIAGNOSIS — Z833 Family history of diabetes mellitus: Secondary | ICD-10-CM | POA: Insufficient documentation

## 2018-02-14 DIAGNOSIS — Z9049 Acquired absence of other specified parts of digestive tract: Secondary | ICD-10-CM | POA: Insufficient documentation

## 2018-02-14 DIAGNOSIS — Z791 Long term (current) use of non-steroidal anti-inflammatories (NSAID): Secondary | ICD-10-CM | POA: Diagnosis not present

## 2018-02-14 DIAGNOSIS — E119 Type 2 diabetes mellitus without complications: Secondary | ICD-10-CM | POA: Insufficient documentation

## 2018-02-14 DIAGNOSIS — Z7902 Long term (current) use of antithrombotics/antiplatelets: Secondary | ICD-10-CM | POA: Insufficient documentation

## 2018-02-14 DIAGNOSIS — Z955 Presence of coronary angioplasty implant and graft: Secondary | ICD-10-CM | POA: Diagnosis not present

## 2018-02-14 DIAGNOSIS — E785 Hyperlipidemia, unspecified: Secondary | ICD-10-CM | POA: Insufficient documentation

## 2018-02-14 DIAGNOSIS — I2510 Atherosclerotic heart disease of native coronary artery without angina pectoris: Secondary | ICD-10-CM | POA: Insufficient documentation

## 2018-02-14 DIAGNOSIS — K3189 Other diseases of stomach and duodenum: Secondary | ICD-10-CM | POA: Diagnosis not present

## 2018-02-14 DIAGNOSIS — G709 Myoneural disorder, unspecified: Secondary | ICD-10-CM | POA: Insufficient documentation

## 2018-02-14 DIAGNOSIS — Z86010 Personal history of colonic polyps: Secondary | ICD-10-CM | POA: Diagnosis not present

## 2018-02-14 DIAGNOSIS — D122 Benign neoplasm of ascending colon: Secondary | ICD-10-CM | POA: Diagnosis not present

## 2018-02-14 DIAGNOSIS — K760 Fatty (change of) liver, not elsewhere classified: Secondary | ICD-10-CM | POA: Insufficient documentation

## 2018-02-14 DIAGNOSIS — R1310 Dysphagia, unspecified: Secondary | ICD-10-CM | POA: Diagnosis not present

## 2018-02-14 DIAGNOSIS — I739 Peripheral vascular disease, unspecified: Secondary | ICD-10-CM | POA: Diagnosis not present

## 2018-02-14 DIAGNOSIS — J45909 Unspecified asthma, uncomplicated: Secondary | ICD-10-CM | POA: Diagnosis not present

## 2018-02-14 DIAGNOSIS — K210 Gastro-esophageal reflux disease with esophagitis: Secondary | ICD-10-CM | POA: Diagnosis not present

## 2018-02-14 DIAGNOSIS — K5730 Diverticulosis of large intestine without perforation or abscess without bleeding: Secondary | ICD-10-CM | POA: Insufficient documentation

## 2018-02-14 DIAGNOSIS — I878 Other specified disorders of veins: Secondary | ICD-10-CM | POA: Insufficient documentation

## 2018-02-14 DIAGNOSIS — D509 Iron deficiency anemia, unspecified: Secondary | ICD-10-CM | POA: Diagnosis not present

## 2018-02-14 DIAGNOSIS — I10 Essential (primary) hypertension: Secondary | ICD-10-CM | POA: Diagnosis not present

## 2018-02-14 DIAGNOSIS — Z6827 Body mass index (BMI) 27.0-27.9, adult: Secondary | ICD-10-CM | POA: Insufficient documentation

## 2018-02-14 DIAGNOSIS — Z7951 Long term (current) use of inhaled steroids: Secondary | ICD-10-CM | POA: Insufficient documentation

## 2018-02-14 DIAGNOSIS — R809 Proteinuria, unspecified: Secondary | ICD-10-CM | POA: Diagnosis not present

## 2018-02-14 DIAGNOSIS — K2950 Unspecified chronic gastritis without bleeding: Secondary | ICD-10-CM | POA: Diagnosis not present

## 2018-02-14 DIAGNOSIS — Z8249 Family history of ischemic heart disease and other diseases of the circulatory system: Secondary | ICD-10-CM | POA: Insufficient documentation

## 2018-02-14 DIAGNOSIS — K209 Esophagitis, unspecified: Secondary | ICD-10-CM

## 2018-02-14 DIAGNOSIS — Z808 Family history of malignant neoplasm of other organs or systems: Secondary | ICD-10-CM | POA: Insufficient documentation

## 2018-02-14 DIAGNOSIS — Z91048 Other nonmedicinal substance allergy status: Secondary | ICD-10-CM | POA: Insufficient documentation

## 2018-02-14 DIAGNOSIS — Z888 Allergy status to other drugs, medicaments and biological substances status: Secondary | ICD-10-CM | POA: Insufficient documentation

## 2018-02-14 DIAGNOSIS — R1319 Other dysphagia: Secondary | ICD-10-CM

## 2018-02-14 DIAGNOSIS — D508 Other iron deficiency anemias: Secondary | ICD-10-CM

## 2018-02-14 DIAGNOSIS — F17210 Nicotine dependence, cigarettes, uncomplicated: Secondary | ICD-10-CM | POA: Insufficient documentation

## 2018-02-14 DIAGNOSIS — E669 Obesity, unspecified: Secondary | ICD-10-CM | POA: Insufficient documentation

## 2018-02-14 DIAGNOSIS — D124 Benign neoplasm of descending colon: Secondary | ICD-10-CM | POA: Insufficient documentation

## 2018-02-14 HISTORY — PX: POLYPECTOMY: SHX5525

## 2018-02-14 HISTORY — PX: BIOPSY: SHX5522

## 2018-02-14 HISTORY — PX: COLONOSCOPY WITH PROPOFOL: SHX5780

## 2018-02-14 HISTORY — PX: MALONEY DILATION: SHX5535

## 2018-02-14 HISTORY — PX: ESOPHAGOGASTRODUODENOSCOPY (EGD) WITH PROPOFOL: SHX5813

## 2018-02-14 LAB — GLUCOSE, CAPILLARY
Glucose-Capillary: 80 mg/dL (ref 70–99)
Glucose-Capillary: 121 mg/dL — ABNORMAL HIGH (ref 70–99)

## 2018-02-14 SURGERY — COLONOSCOPY WITH PROPOFOL
Anesthesia: Monitor Anesthesia Care

## 2018-02-14 MED ORDER — MIDAZOLAM HCL 2 MG/2ML IJ SOLN
INTRAMUSCULAR | Status: AC
  Filled 2018-02-14: qty 2

## 2018-02-14 MED ORDER — PROPOFOL 10 MG/ML IV BOLUS
INTRAVENOUS | Status: AC
  Filled 2018-02-14: qty 60

## 2018-02-14 MED ORDER — CHLORHEXIDINE GLUCONATE CLOTH 2 % EX PADS: 6.0000 | MEDICATED_PAD | Freq: Once | CUTANEOUS | Status: DC

## 2018-02-14 MED ORDER — STERILE WATER FOR IRRIGATION IR SOLN
Status: DC | PRN
  Administered 2018-02-14: 100 mL

## 2018-02-14 MED ORDER — GLYCOPYRROLATE 0.2 MG/ML IJ SOLN
INTRAMUSCULAR | Status: AC
  Filled 2018-02-14: qty 1

## 2018-02-14 MED ORDER — PROPOFOL 500 MG/50ML IV EMUL
INTRAVENOUS | Status: DC | PRN
  Administered 2018-02-14: 25 ug/kg/min via INTRAVENOUS

## 2018-02-14 MED ORDER — LIDOCAINE HCL (PF) 1 % IJ SOLN
INTRAMUSCULAR | Status: AC
  Filled 2018-02-14: qty 5

## 2018-02-14 MED ORDER — LACTATED RINGERS IV SOLN
INTRAVENOUS | Status: DC
  Administered 2018-02-14: 07:00:00 via INTRAVENOUS

## 2018-02-14 MED ORDER — MIDAZOLAM HCL 5 MG/5ML IJ SOLN
INTRAMUSCULAR | Status: DC | PRN
  Administered 2018-02-14: 2 mg via INTRAVENOUS

## 2018-02-14 MED ORDER — EPHEDRINE SULFATE 50 MG/ML IJ SOLN
INTRAMUSCULAR | Status: DC | PRN
  Administered 2018-02-14: 10 mg via INTRAVENOUS

## 2018-02-14 MED ORDER — LIDOCAINE HCL 1 % IJ SOLN
INTRAMUSCULAR | Status: DC | PRN
  Administered 2018-02-14: 50 mg via INTRADERMAL

## 2018-02-14 MED ORDER — PROPOFOL 10 MG/ML IV BOLUS
INTRAVENOUS | Status: DC | PRN
  Administered 2018-02-14: 30 mg via INTRAVENOUS

## 2018-02-14 MED ORDER — GLYCOPYRROLATE 0.2 MG/ML IJ SOLN
INTRAMUSCULAR | Status: DC | PRN
  Administered 2018-02-14: 0.2 mg via INTRAVENOUS

## 2018-02-14 MED ORDER — ATROPINE SULFATE 1 MG/ML IJ SOLN
INTRAMUSCULAR | Status: AC
  Filled 2018-02-14: qty 1

## 2018-02-14 NOTE — Transfer of Care (Signed)
Immediate Anesthesia Transfer of Care Note  Patient: Rachel [name]  Procedure(s) Performed: COLONOSCOPY WITH PROPOFOL (N/A ) ESOPHAGOGASTRODUODENOSCOPY (EGD) WITH PROPOFOL (N/A ) MALONEY DILATION (N/A ) BIOPSY POLYPECTOMY  Patient Location: PACU  Anesthesia Type:MAC  Level of Consciousness: awake, alert  and oriented  Airway & Oxygen Therapy: Patient Spontanous Breathing  Post-op Assessment: Report given to RN  Post vital signs: Reviewed and stable  Last Vitals:  Vitals Value Taken Time  BP 91/65 02/14/2018  8:35 AM  Temp    Pulse 102 02/14/2018  8:39 AM  Resp 19 02/14/2018  8:39 AM  SpO2 100 % 02/14/2018  8:39 AM  Vitals shown include unvalidated device data.  Last Pain:  Vitals:   02/14/18 0825  TempSrc:   PainSc: 0-No pain      Patients Stated Pain Goal: 7 (87/57/97 2820)  Complications: No apparent anesthesia complications

## 2018-02-14 NOTE — Op Note (Signed)
[facility] Patient Name: Jodiann Ognibene Procedure Date: 02/14/2018 7:57 AM MRN: [id number] Date of Birth: 1950/08/30 Attending MD: Norvel Richards , MD CSN: [id number] Age: 68 Admit Type: Outpatient Procedure:                Colonoscopy Indications:              Iron deficiency anemia Providers:                Norvel Richards, MD, Rosina Lowenstein, RN, Nelma Rothman, Technician Referring MD:              Medicines:                Propofol per Anesthesia Complications:            No immediate complications. Estimated Blood Loss:     Estimated blood loss was minimal. Procedure:                Pre-Anesthesia Assessment:                           - Prior to the procedure, a History and Physical                            was performed, and patient medications and                            allergies were reviewed. The patient's tolerance of                            previous anesthesia was also reviewed. The risks                            and benefits of the procedure and the sedation                            options and risks were discussed with the patient.                            All questions were answered, and informed consent                            was obtained. Prior Anticoagulants: The patient has                            taken no previous anticoagulant or antiplatelet                            agents. ASA Grade Assessment: III - A patient with                            severe systemic disease. After reviewing the risks  and benefits, the patient was deemed in                            satisfactory condition to undergo the procedure.                           After obtaining informed consent, the colonoscope                            was passed under direct vision. Throughout the                            procedure, the patient's blood pressure, pulse, and                            oxygen saturations  were monitored continuously. The                            CF-[id number] ([id number]) scope was introduced through                            the and advanced to the 5 cm into the ileum. The                            colonoscopy was performed without difficulty. The                            patient tolerated the procedure well. The quality                            of the bowel preparation was adequate. Scope In: 8:00:39 AM Scope Out: 8:23:08 AM Scope Withdrawal Time: 0 hours 11 minutes 20 seconds  Total Procedure Duration: 0 hours 22 minutes 29 seconds  Findings:      The perianal and digital rectal examinations were normal.      Multiple medium-mouthed diverticula were found in the sigmoid colon and       descending colon.      Three sessile polyps were found in the descending colon and ascending       colon. The polyps were 4 to 6 mm in size. These polyps were removed with       a cold snare. Resection and retrieval were complete. Estimated blood       loss was minimal.      The exam was otherwise without abnormality on direct and retroflexion       views. Distal 5 cm of terminal ileum appeared normal. Impression:               - Diverticulosis in the sigmoid colon and in the                            descending colon.                           - Three 4 to 6 mm polyps in the descending colon  and in the ascending colon, removed with a cold                            snare. Resected and retrieved.                           - The examination was otherwise normal on direct                            and retroflexion views. Moderate Sedation:      Moderate (conscious) sedation was personally administered by an       anesthesia professional. The following parameters were monitored: oxygen       saturation, heart rate, blood pressure, respiratory rate, EKG, adequacy       of pulmonary ventilation, and response to care. Recommendation:           - Patient has a  contact number available for                            emergencies. The signs and symptoms of potential                            delayed complications were discussed with the                            patient. Return to normal activities tomorrow.                            Written discharge instructions were provided to the                            patient.                           - Advance diet as tolerated.                           - Repeat colonoscopy date to be determined after                            pending pathology results are reviewed for                            surveillance.                           - Return to GI office in 2 months. See EGD report. Procedure Code(s):        --- Professional ---                           [phone], Colonoscopy, flexible; with removal of                            tumor(s), polyp(s), or other lesion(s) by snare  technique Diagnosis Code(s):        --- Professional ---                           D12.4, Benign neoplasm of descending colon                           D12.2, Benign neoplasm of ascending colon                           D50.9, Iron deficiency anemia, unspecified                           K57.30, Diverticulosis of large intestine without                            perforation or abscess without bleeding CPT copyright 2018 [employer]. All rights reserved. The codes documented in this report are preliminary and upon coder review may  be revised to meet current compliance requirements. Cristopher Estimable. Manar Smalling, MD Norvel Richards, MD 02/14/2018 8:52:03 AM This report has been signed electronically. Number of Addenda: 0

## 2018-02-14 NOTE — Discharge Instructions (Signed)
Colon polyp and diverticulosis information provided  GERD information provided  Continue Plavix daily uninterrupted  Further recommendations to follow pending review of pathology report  Increase Protonix to 40 mg twice daily  Office visit with [place] in 2 months. Follow up on March 17 at 11:00 with [facility].  Colonoscopy Discharge Instructions  Read the instructions outlined below and refer to this sheet in the next few weeks. These discharge instructions provide you with general information on caring for yourself after you leave the hospital. Your doctor may also give you specific instructions. While your treatment has been planned according to the most current medical practices available, unavoidable complications occasionally occur. If you have any problems or questions after discharge, call Dr. Gala Romney at [phone]. ACTIVITY  You may resume your regular activity, but move at a slower pace for the next 24 hours.   Take frequent rest periods for the next 24 hours.   Walking will help get rid of the air and reduce the bloated feeling in your belly (abdomen).   No driving for 24 hours (because of the medicine (anesthesia) used during the test).    Do not sign any important legal documents or operate any machinery for 24 hours (because of the anesthesia used during the test).  NUTRITION  Drink plenty of fluids.   You may resume your normal diet as instructed by your doctor.   Begin with a light meal and progress to your normal diet. Heavy or fried foods are harder to digest and may make you feel sick to your stomach (nauseated).   Avoid alcoholic beverages for 24 hours or as instructed.  MEDICATIONS  You may resume your normal medications unless your doctor tells you otherwise.  WHAT YOU CAN EXPECT TODAY  Some feelings of bloating in the abdomen.   Passage of more gas than usual.   Spotting of blood in your stool or on the toilet paper.  IF YOU HAD POLYPS REMOVED DURING THE  COLONOSCOPY:  No aspirin products for 7 days or as instructed.   No alcohol for 7 days or as instructed.   Eat a soft diet for the next 24 hours.  FINDING OUT THE RESULTS OF YOUR TEST Not all test results are available during your visit. If your test results are not back during the visit, make an appointment with your caregiver to find out the results. Do not assume everything is normal if you have not heard from your caregiver or the medical facility. It is important for you to follow up on all of your test results.  SEEK IMMEDIATE MEDICAL ATTENTION IF:  You have more than a spotting of blood in your stool.   Your belly is swollen (abdominal distention).   You are nauseated or vomiting.   You have a temperature over 101.   You have abdominal pain or discomfort that is severe or gets worse throughout the day.    EGD Discharge instructions Please read the instructions outlined below and refer to this sheet in the next few weeks. These discharge instructions provide you with general information on caring for yourself after you leave the hospital. Your doctor may also give you specific instructions. While your treatment has been planned according to the most current medical practices available, unavoidable complications occasionally occur. If you have any problems or questions after discharge, please call your doctor. ACTIVITY  You may resume your regular activity but move at a slower pace for the next 24 hours.   Take frequent  rest periods for the next 24 hours.   Walking will help expel (get rid of) the air and reduce the bloated feeling in your abdomen.   No driving for 24 hours (because of the anesthesia (medicine) used during the test).   You may shower.   Do not sign any important legal documents or operate any machinery for 24 hours (because of the anesthesia used during the test).  NUTRITION  Drink plenty of fluids.   You may resume your normal diet.   Begin with a  light meal and progress to your normal diet.   Avoid alcoholic beverages for 24 hours or as instructed by your caregiver.  MEDICATIONS  You may resume your normal medications unless your caregiver tells you otherwise.  WHAT YOU CAN EXPECT TODAY  You may experience abdominal discomfort such as a feeling of fullness or gas pains.  FOLLOW-UP  Your doctor will discuss the results of your test with you.  SEEK IMMEDIATE MEDICAL ATTENTION IF ANY OF THE FOLLOWING OCCUR:  Excessive nausea (feeling sick to your stomach) and/or vomiting.   Severe abdominal pain and distention (swelling).   Trouble swallowing.   Temperature over 101 F (37.8 C).   Rectal bleeding or vomiting of blood.    Colon Polyps  Polyps are tissue growths inside the body. Polyps can grow in many places, including the large intestine (colon). A polyp may be a round bump or a mushroom-shaped growth. You could have one polyp or several. Most colon polyps are noncancerous (benign). However, some colon polyps can become cancerous over time. Finding and removing the polyps early can help prevent this. What are the causes? The exact cause of colon polyps is not known. What increases the risk? You are more likely to develop this condition if you:  Have a family history of colon cancer or colon polyps.  Are older than 78 or older than 45 if you are African American.  Have inflammatory bowel disease, such as ulcerative colitis or Crohn's disease.  Have certain hereditary conditions, such as: ? Familial adenomatous polyposis. ? Lynch syndrome. ? Turcot syndrome. ? Peutz-Jeghers syndrome.  Are overweight.  Smoke cigarettes.  Do not get enough exercise.  Drink too much alcohol.  Eat a diet that is high in fat and red meat and low in fiber.  Had childhood cancer that was treated with abdominal radiation. What are the signs or symptoms? Most polyps do not cause symptoms. If you have symptoms, they may  include:  Blood coming from your rectum when having a bowel movement.  Blood in your stool. The stool may look dark red or black.  Abdominal pain.  A change in bowel habits, such as constipation or diarrhea. How is this diagnosed? This condition is diagnosed with a colonoscopy. This is a procedure in which a lighted, flexible scope is inserted into the anus and then passed into the colon to examine the area. Polyps are sometimes found when a colonoscopy is done as part of routine cancer screening tests. How is this treated? Treatment for this condition involves removing any polyps that are found. Most polyps can be removed during a colonoscopy. Those polyps will then be tested for cancer. Additional treatment may be needed depending on the results of testing. Follow these instructions at home: Lifestyle  Maintain a healthy weight, or lose weight if recommended by your health care provider.  Exercise every day or as told by your health care provider.  Do not use any products that contain  nicotine or tobacco, such as cigarettes and e-cigarettes. If you need help quitting, ask your health care provider.  If you drink alcohol, limit how much you have: ? 0-1 drink a day for women. ? 0-2 drinks a day for men.  Be aware of how much alcohol is in your drink. In the [place] one drink equals one 12 oz bottle of beer (355 mL), one 5 oz glass of wine (148 mL), or one 1 oz shot of hard liquor (44 mL). Eating and drinking   Eat foods that are high in fiber, such as fruits, vegetables, and whole grains.  Eat foods that are high in calcium and vitamin D, such as milk, cheese, yogurt, eggs, liver, fish, and broccoli.  Limit foods that are high in fat, such as fried foods and desserts.  Limit the amount of red meat and processed meat you eat, such as hot dogs, sausage, bacon, and lunch meats. General instructions  Keep all follow-up visits as told by your health care provider. This is  important. ? This includes having regularly scheduled colonoscopies. ? Talk to your health care provider about when you need a colonoscopy. Contact a health care provider if:  You have new or worsening bleeding during a bowel movement.  You have new or increased blood in your stool.  You have a change in bowel habits.  You lose weight for no known reason. Summary  Polyps are tissue growths inside the body. Polyps can grow in many places, including the colon.  Most colon polyps are noncancerous (benign), but some can become cancerous over time.  This condition is diagnosed with a colonoscopy.  Treatment for this condition involves removing any polyps that are found. Most polyps can be removed during a colonoscopy. This information is not intended to replace advice given to you by your health care provider. Make sure you discuss any questions you have with your health care provider. Document Released: 10/20/2003 Document Revised: 05/10/2017 Document Reviewed: 05/10/2017 Elsevier Interactive Patient Education  2019 [employer].   Diverticulosis  Diverticulosis is a condition that develops when small pouches (diverticula) form in the wall of the large intestine (colon). The colon is where water is absorbed and stool is formed. The pouches form when the inside layer of the colon pushes through weak spots in the outer layers of the colon. You may have a few pouches or many of them. What are the causes? The cause of this condition is not known. What increases the risk? The following factors may make you more likely to develop this condition:  Being older than age 71. Your risk for this condition increases with age. Diverticulosis is rare among people younger than age 49. By age 73, many people have it.  Eating a low-fiber diet.  Having frequent constipation.  Being overweight.  Not getting enough exercise.  Smoking.  Taking over-the-counter pain medicines, like aspirin and  ibuprofen.  Having a family history of diverticulosis. What are the signs or symptoms? In most people, there are no symptoms of this condition. If you do have symptoms, they may include:  Bloating.  Cramps in the abdomen.  Constipation or diarrhea.  Pain in the lower left side of the abdomen. How is this diagnosed? This condition is most often diagnosed during an exam for other colon problems. Because diverticulosis usually has no symptoms, it often cannot be diagnosed independently. This condition may be diagnosed by:  Using a flexible scope to examine the colon (colonoscopy).  Taking an X-ray  of the colon after dye has been put into the colon (barium enema).  Doing a CT scan. How is this treated? You may not need treatment for this condition if you have never developed an infection related to diverticulosis. If you have had an infection before, treatment may include:  Eating a high-fiber diet. This may include eating more fruits, vegetables, and grains.  Taking a fiber supplement.  Taking a live bacteria supplement (probiotic).  Taking medicine to relax your colon.  Taking antibiotic medicines. Follow these instructions at home:  Drink 6-8 glasses of water or more each day to prevent constipation.  Try not to strain when you have a bowel movement.  If you have had an infection before: ? Eat more fiber as directed by your health care provider or your diet and nutrition specialist (dietitian). ? Take a fiber supplement or probiotic, if your health care provider approves.  Take over-the-counter and prescription medicines only as told by your health care provider.  If you were prescribed an antibiotic, take it as told by your health care provider. Do not stop taking the antibiotic even if you start to feel better.  Keep all follow-up visits as told by your health care provider. This is important. Contact a health care provider if:  You have pain in your abdomen.  You  have bloating.  You have cramps.  You have not had a bowel movement in 3 days. Get help right away if:  Your pain gets worse.  Your bloating becomes very bad.  You have a fever or chills, and your symptoms suddenly get worse.  You vomit.  You have bowel movements that are bloody or black.  You have bleeding from your rectum. Summary  Diverticulosis is a condition that develops when small pouches (diverticula) form in the wall of the large intestine (colon).  You may have a few pouches or many of them.  This condition is most often diagnosed during an exam for other colon problems.  If you have had an infection related to diverticulosis, treatment may include increasing the fiber in your diet, taking supplements, or taking medicines. This information is not intended to replace advice given to you by your health care provider. Make sure you discuss any questions you have with your health care provider. Document Released: 10/21/2003 Document Revised: 12/13/2015 Document Reviewed: 12/13/2015 Elsevier Interactive Patient Education  2019 [employer].   Gastroesophageal Reflux Disease, Adult Gastroesophageal reflux (GER) happens when acid from the stomach flows up into the tube that connects the mouth and the stomach (esophagus). Normally, food travels down the esophagus and stays in the stomach to be digested. However, when a person has GER, food and stomach acid sometimes move back up into the esophagus. If this becomes a more serious problem, the person may be diagnosed with a disease called gastroesophageal reflux disease (GERD). GERD occurs when the reflux:  Happens often.  Causes frequent or severe symptoms.  Causes problems such as damage to the esophagus. When stomach acid comes in contact with the esophagus, the acid may cause soreness (inflammation) in the esophagus. Over time, GERD may create small holes (ulcers) in the lining of the esophagus. What are the  causes? This condition is caused by a problem with the muscle between the esophagus and the stomach (lower esophageal sphincter, or LES). Normally, the LES muscle closes after food passes through the esophagus to the stomach. When the LES is weakened or abnormal, it does not close properly, and that allows  food and stomach acid to go back up into the esophagus. The LES can be weakened by certain dietary substances, medicines, and medical conditions, including:  Tobacco use.  Pregnancy.  Having a hiatal hernia.  Alcohol use.  Certain foods and beverages, such as coffee, chocolate, onions, and peppermint. What increases the risk? You are more likely to develop this condition if you:  Have an increased body weight.  Have a connective tissue disorder.  Use NSAID medicines. What are the signs or symptoms? Symptoms of this condition include:  Heartburn.  Difficult or painful swallowing.  The feeling of having a lump in the throat.  Abitter taste in the mouth.  Bad breath.  Having a large amount of saliva.  Having an upset or bloated stomach.  Belching.  Chest pain. Different conditions can cause chest pain. Make sure you see your health care provider if you experience chest pain.  Shortness of breath or wheezing.  Ongoing (chronic) cough or a night-time cough.  Wearing away of tooth enamel.  Weight loss. How is this diagnosed? Your health care provider will take a medical history and perform a physical exam. To determine if you have mild or severe GERD, your health care provider may also monitor how you respond to treatment. You may also have tests, including:  A test to examine your stomach and esophagus with a small camera (endoscopy).  A test thatmeasures the acidity level in your esophagus.  A test thatmeasures how much pressure is on your esophagus.  A barium swallow or modified barium swallow test to show the shape, size, and functioning of your  esophagus. How is this treated? The goal of treatment is to help relieve your symptoms and to prevent complications. Treatment for this condition may vary depending on how severe your symptoms are. Your health care provider may recommend:  Changes to your diet.  Medicine.  Surgery. Follow these instructions at home: Eating and drinking   Follow a diet as recommended by your health care provider. This may involve avoiding foods and drinks such as: ? Coffee and tea (with or without caffeine). ? Drinks that containalcohol. ? Energy drinks and sports drinks. ? Carbonated drinks or sodas. ? Chocolate and cocoa. ? Peppermint and mint flavorings. ? Garlic and onions. ? Horseradish. ? Spicy and acidic foods, including peppers, chili powder, curry powder, vinegar, hot sauces, and barbecue sauce. ? Citrus fruit juices and citrus fruits, such as oranges, lemons, and limes. ? Tomato-based foods, such as red sauce, chili, salsa, and pizza with red sauce. ? Fried and fatty foods, such as donuts, french fries, potato chips, and high-fat dressings. ? High-fat meats, such as hot dogs and fatty cuts of red and white meats, such as rib eye steak, sausage, ham, and bacon. ? High-fat dairy items, such as whole milk, butter, and cream cheese.  Eat small, frequent meals instead of large meals.  Avoid drinking large amounts of liquid with your meals.  Avoid eating meals during the 2-3 hours before bedtime.  Avoid lying down right after you eat.  Do not exercise right after you eat. Lifestyle   Do not use any products that contain nicotine or tobacco, such as cigarettes, e-cigarettes, and chewing tobacco. If you need help quitting, ask your health care provider.  Try to reduce your stress by using methods such as yoga or meditation. If you need help reducing stress, ask your health care provider.  If you are overweight, reduce your weight to an amount that is healthy  for you. Ask your health  care provider for guidance about a safe weight loss goal. General instructions  Pay attention to any changes in your symptoms.  Take over-the-counter and prescription medicines only as told by your health care provider. Do not take aspirin, ibuprofen, or other NSAIDs unless your health care provider told you to do so.  Wear loose-fitting clothing. Do not wear anything tight around your waist that causes pressure on your abdomen.  Raise (elevate) the head of your bed about 6 inches (15 cm).  Avoid bending over if this makes your symptoms worse.  Keep all follow-up visits as told by your health care provider. This is important. Contact a health care provider if:  You have: ? New symptoms. ? Unexplained weight loss. ? Difficulty swallowing or it hurts to swallow. ? Wheezing or a persistent cough. ? A hoarse voice.  Your symptoms do not improve with treatment. Get help right away if you:  Have pain in your arms, neck, jaw, teeth, or back.  Feel sweaty, dizzy, or light-headed.  Have chest pain or shortness of breath.  Vomit and your vomit looks like blood or coffee grounds.  Faint.  Have stool that is bloody or black.  Cannot swallow, drink, or eat. Summary  Gastroesophageal reflux happens when acid from the stomach flows up into the esophagus. GERD is a disease in which the reflux happens often, causes frequent or severe symptoms, or causes problems such as damage to the esophagus.  Treatment for this condition may vary depending on how severe your symptoms are. Your health care provider may recommend diet and lifestyle changes, medicine, or surgery.  Contact a health care provider if you have new or worsening symptoms.  Take over-the-counter and prescription medicines only as told by your health care provider. Do not take aspirin, ibuprofen, or other NSAIDs unless your health care provider told you to do so.  Keep all follow-up visits as told by your health care  provider. This is important. This information is not intended to replace advice given to you by your health care provider. Make sure you discuss any questions you have with your health care provider. Document Released: 11/02/2004 Document Revised: 08/01/2017 Document Reviewed: 08/01/2017 Elsevier Interactive Patient Education  2019 [employer].

## 2018-02-14 NOTE — Anesthesia Postprocedure Evaluation (Signed)
Anesthesia Post Note  Patient: [name]  Procedure(s) Performed: COLONOSCOPY WITH PROPOFOL (N/A ) ESOPHAGOGASTRODUODENOSCOPY (EGD) WITH PROPOFOL (N/A ) MALONEY DILATION (N/A ) BIOPSY POLYPECTOMY  Patient location during evaluation: PACU Anesthesia Type: MAC Level of consciousness: awake and alert and oriented Pain management: pain level controlled Vital Signs Assessment: post-procedure vital signs reviewed and stable Respiratory status: spontaneous breathing Cardiovascular status: blood pressure returned to baseline and stable Postop Assessment: no apparent nausea or vomiting Anesthetic complications: no     Last Vitals:  Vitals:   02/14/18 0648 02/14/18 0833  BP: (!) 145/78 (P) 91/65  Pulse: 78 (P) 98  Resp: 20 (P) 14  Temp: 36.8 C (!) (P) 36.3 C  SpO2: 96% (P) 100%    Last Pain:  Vitals:   02/14/18 0825  TempSrc:   PainSc: 0-No pain                 Shrihaan Porzio

## 2018-02-14 NOTE — Op Note (Signed)
[facility] Patient Name: Rachel [name] Procedure Date: 02/14/2018 7:24 AM MRN: [id number] Date of Birth: December 08, 1950 Attending MD: Norvel Richards , MD CSN: [id number] Age: 68 Admit Type: Outpatient Procedure:                Upper GI endoscopy Indications:              Iron deficiency anemia, ; dysphagia Providers:                Norvel Richards, MD, Rosina Lowenstein, RN, Nelma Rothman, Technician Referring MD:              Medicines:                Propofol per Anesthesia Complications:            No immediate complications. Estimated Blood Loss:     Estimated blood loss was minimal. Procedure:                Pre-Anesthesia Assessment:                           - Prior to the procedure, a History and Physical                            was performed, and patient medications and                            allergies were reviewed. The patient's tolerance of                            previous anesthesia was also reviewed. The risks                            and benefits of the procedure and the sedation                            options and risks were discussed with the patient.                            All questions were answered, and informed consent                            was obtained. Prior Anticoagulants: The patient has                            taken no previous anticoagulant or antiplatelet                            agents. ASA Grade Assessment: III - A patient with                            severe systemic disease. After reviewing the risks  and benefits, the patient was deemed in                            satisfactory condition to undergo the procedure.                           After obtaining informed consent, the endoscope was                            passed under direct vision. Throughout the                            procedure, the patient's blood pressure, pulse, and   oxygen saturations were monitored continuously. The                            GIF-H190 ([id number]) scope was introduced through the                            and advanced to the second part of duodenum. The                            upper GI endoscopy was accomplished without                            difficulty. The patient tolerated the procedure                            well. Scope In: 7:47:21 AM Scope Out: 7:55:04 AM Total Procedure Duration: 0 hours 7 minutes 43 seconds  Findings:      Single 1 cm distal esophageal erosion. No Barrett's epithelium seen.       Tubular esophagus patent throughout its course.      Multiple erosions were found in the entire examined stomach. No ulcer or       infiltrating process. Bulbar erosions present otherwise D1 and D2       appeared normal. . The scope was withdrawn. Dilation was performed with       a Maloney dilator with mild resistance at 73 Fr. The dilation site was       examined and showed no change. Estimated blood loss: none. Finally,This       was biopsied with a cold forceps for histology Impression:               -Mild erosive reflux esophagitis. dilated.                           - Erosive gastropathy. Biopsied. Gastric                            erosions?"status post biopsy Moderate Sedation:      Moderate (conscious) sedation was personally administered by an       anesthesia professional. The following parameters were monitored: oxygen       saturation, heart rate, blood pressure, respiratory rate, EKG, adequacy       of pulmonary ventilation, and response to care. Recommendation:           -  Patient has a contact number available for                            emergencies. The signs and symptoms of potential                            delayed complications were discussed with the                            patient. Return to normal activities tomorrow.                            Written discharge instructions were provided to  the                            patient.                           - Advance diet as tolerated. Increase Protonix to                            40 mg twice daily for the next 2 months. Continue                            Plavix uninterrupted. See colonoscopy report.                           - Continue present medications.                           - No repeat upper endoscopy.                           - Return to GI office in 2 months. Follow-up on                            pathology. Procedure Code(s):        --- Professional ---                           [phone], Esophagogastroduodenoscopy, flexible,                            transoral; with biopsy, single or multiple                           43450, Dilation of esophagus, by unguided sound or                            bougie, single or multiple passes Diagnosis Code(s):        --- Professional ---                           K20.9, Esophagitis, unspecified  K31.89, Other diseases of stomach and duodenum                           D50.9, Iron deficiency anemia, unspecified CPT copyright 2018 American [employer]. All rights reserved. The codes documented in this report are preliminary and upon coder review may  be revised to meet current compliance requirements. [name], MD Norvel Richards, MD 02/14/2018 8:48:01 AM This report has been signed electronically. Number of Addenda: 0

## 2018-02-14 NOTE — Anesthesia Preprocedure Evaluation (Addendum)
Anesthesia Evaluation    Airway Mallampati: II       Dental  (+) Teeth Intact   Pulmonary asthma , COPD, Current Smoker,     + decreased breath sounds      Cardiovascular hypertension, + CAD and + Peripheral Vascular Disease   Rhythm:regular     Neuro/Psych  Neuromuscular disease    GI/Hepatic GERD  ,  Endo/Other  diabetesHypothyroidism   Renal/GU Renal disease     Musculoskeletal   Abdominal   Peds  Hematology   Anesthesia Other Findings COPD, no O2 Recent "lung infection" tx with ABX,no wproductive of clear sputum  Reproductive/Obstetrics                             Anesthesia Physical Anesthesia Plan  ASA: IV  Anesthesia Plan: MAC   Post-op Pain Management:    Induction:   PONV Risk Score and Plan:   Airway Management Planned:   Additional Equipment:   Intra-op Plan:   Post-operative Plan:   Informed Consent: I have reviewed the patients History and Physical, chart, labs and discussed the procedure including the risks, benefits and alternatives for the proposed anesthesia with the patient or authorized representative who has indicated his/her understanding and acceptance.     Plan Discussed with: Anesthesiologist  Anesthesia Plan Comments:        Anesthesia Quick Evaluation

## 2018-02-14 NOTE — H&P (Addendum)
_0 @   Primary Care Physician:  Mikey Kirschner, MD Primary Gastroenterologist:  Dr. Gala Romney  Pre-Procedure History & Physical: HPI:  Rachel [name] is a 68 y.o. female here for further evaluation of dysphagia via EGD and EGD and colonoscopy for iron deficiency anemia.  History of colonic polyps.  Currently on Plavix.  Past Medical History:  Diagnosis Date  . Asthma   . Coronary atherosclerosis of native coronary artery    DES x 2 (overlapping) RCA 03/2008 - patent 2011  . Diabetes type 2, controlled ([place]) 2000  . Essential hypertension, benign   . Fatty liver   . GERD (gastroesophageal reflux disease)   . Hyperlipidemia   . Microalbuminuria   . Obesity   . Odynophagia    Severe esophagitis on CT 05/2010, treated empirically with Diflucan  . PAD (peripheral artery disease) (HCC)    history of left greater than right lower 70 claudication  . Tubular adenoma of colon 07/2007   Multiple colonic polyps  . Venous stasis     Past Surgical History:  Procedure Laterality Date  . CARDIAC CATHETERIZATION     stents  . CHOLECYSTECTOMY N/A 07/30/2015   Procedure: LAPAROSCOPIC CHOLECYSTECTOMY;  Surgeon: Aviva Signs, MD;  Location: [facility];  Service: General;  Laterality: N/A;  . COLONOSCOPY W/ POLYPECTOMY  07/2007   Tubular adenoma  . CORONARY STENT PLACEMENT    . CYSTECTOMY     Pilonidal  . ESOPHAGOGASTRODUODENOSCOPY  04/2010   RMR: Normal esophagus , Stomach D1 and D2   . LOWER EXTREMITY ANGIOGRAPHY N/A 03/13/2016   Procedure: Lower Extremity Angiography;  Surgeon: [name], MD;  Location: [place] [facility];  Service: Cardiovascular;  Laterality: N/A;  . PERIPHERAL VASCULAR ATHERECTOMY  03/27/2016   Procedure: Peripheral Vascular Atherectomy;  Surgeon: [name], MD;  Location: [place] [facility];  Service: Cardiovascular;;  Attempted atherectomy  . PERIPHERAL VASCULAR CATHETERIZATION Bilateral 09/28/2014   Procedure: Lower Extremity Angiography;  Surgeon: [name], MD;  Location: [place] [facility];  Service: Cardiovascular;  Laterality: Bilateral;  . PERIPHERAL VASCULAR CATHETERIZATION N/A 09/28/2014   Procedure: Abdominal Aortogram;  Surgeon: [name], MD;  Location: [place] [facility];  Service: Cardiovascular;  Laterality: N/A;  . PERIPHERAL VASCULAR CATHETERIZATION N/A 10/15/2014   Procedure: Lower Extremity Angiography;  Surgeon: [name], MD;  Location: [place] [facility];  Service: Cardiovascular;  Laterality: N/A;  . PERIPHERAL VASCULAR CATHETERIZATION  10/15/2014   Procedure: Peripheral Vascular Intervention;  Surgeon: [name], MD;  Location: [place] [facility];  Service: Cardiovascular;;  bilateral iliac stents  . PILONIDAL CYST / SINUS EXCISION    . TUBAL LIGATION Bilateral     Prior to Admission medications   Medication Sig Start Date End Date Taking? Authorizing Provider  albuterol (PROAIR HFA) 108 (90 Base) MCG/ACT inhaler Inhale 2 puffs into the lungs every 4 (four) hours as needed for wheezing or shortness of breath. 05/10/17  Yes Collene Gobble, MD  aspirin EC 81 MG tablet Take 81 mg by mouth daily.     Yes [provider]  clopidogrel (PLAVIX) 75 MG tablet Take 1 tablet (75 mg total) by mouth daily. 12/20/17  Yes [name], MD  fluticasone (FLONASE) 50 MCG/ACT nasal spray Place 2 sprays into both nostrils 2 (two) times daily. Patient taking differently: Place 2 sprays into both nostrils daily.  12/21/17  Yes Collene Gobble, MD  gabapentin (NEURONTIN) 400 MG capsule TAKE  ONE CAPSULE BY MOUTH AT BEDTIME AS DIRECTED Patient taking differently: Take 400 mg by mouth at bedtime.  12/11/17  Yes Mikey Kirschner, MD  hydrochlorothiazide (MICROZIDE) 12.5 MG capsule TAKE 1 CAPSULE BY MOUTH EVERY DAY 01/29/18  Yes Mikey Kirschner, MD  insulin aspart (NOVOLOG FLEXPEN) 100 UNIT/ML FlexPen Inject 7 Units into the skin 3 (three) times daily with meals. 02/04/18  Yes Shamleffer, Melanie Crazier, MD  Insulin  Glargine (LANTUS SOLOSTAR) 100 UNIT/ML Solostar Pen Inject 28 Units into the skin daily. 02/04/18  Yes Shamleffer, Melanie Crazier, MD  levothyroxine (SYNTHROID, LEVOTHROID) 100 MCG tablet TAKE 1 TABLET BY MOUTH EVERY DAY BEFORE BREAKFAST Patient taking differently: Take 100 mcg by mouth daily before breakfast.  09/19/17  Yes Nida, Marella Chimes, MD  meclizine (ANTIVERT) 25 MG tablet Take 1 tablet (25 mg total) by mouth 3 (three) times daily as needed for dizziness. 05/23/17  Yes Mikey Kirschner, MD  metFORMIN (GLUCOPHAGE XR) 500 MG 24 hr tablet Take 1 tablet (500 mg total) by mouth 2 (two) times daily with a meal. 02/04/18  Yes Shamleffer, Melanie Crazier, MD  Na Sulfate-K Sulfate-Mg Sulf 17.5-3.13-1.6 GM/177ML SOLN Take 1 kit by mouth as directed. 12/26/17  Yes Demetreus Lothamer, Cristopher Estimable, MD  potassium chloride SA (K-DUR,KLOR-CON) 20 MEQ tablet Take 1 tablet (20 mEq total) by mouth 2 (two) times daily. 11/06/17  Yes Daleen Bo, MD  rosuvastatin (CRESTOR) 40 MG tablet TAKE 1 TABLET EVERY DAY AT BEDTIME Patient taking differently: Take 40 mg by mouth at bedtime.  12/20/17  Yes [name], MD  tiotropium (SPIRIVA) 18 MCG inhalation capsule Place 1 capsule (18 mcg total) into inhaler and inhale daily. 01/24/18  Yes Collene Gobble, MD  pantoprazole (PROTONIX) 40 MG tablet Take 1 tablet (40 mg total) by mouth daily. 07/26/17   Mikey Kirschner, MD  Semaglutide,0.25 or 0.5MG/DOS, (OZEMPIC, 0.25 OR 0.5 MG/DOSE,) 2 MG/1.5ML SOPN Inject 0.25 mg into the skin once a week. 02/04/18   Shamleffer, Melanie Crazier, MD    Allergies as of 12/26/2017 - Review Complete 12/25/2017  Allergen Reaction Noted  . Altace [ramipril] Cough 06/07/2012  . Biaxin [clarithromycin]  06/07/2012  . Niacin and related Other (See Comments) 06/07/2012  . Nsaids Other (See Comments) 10/09/2014  . Tramadol  11/06/2017  . Adhesive [tape] Rash 03/22/2016    Family History  Problem Relation Age of Onset  . Bone cancer Father    . Heart disease Maternal Grandmother   . Hypertension Mother   . Diabetes Mother   . Colon cancer Neg Hx   . Liver disease Neg Hx   . Inflammatory bowel disease Neg Hx     Social History   Socioeconomic History  . Marital status: Divorced    Spouse name: Not on file  . Number of children: 1  . Years of education: Not on file  . Highest education level: Not on file  Occupational History  . Occupation: Tax inspector: [employer]  . Financial resource strain: Not on file  . Food insecurity:    Worry: Not on file    Inability: Not on file  . Transportation needs:    Medical: Not on file    Non-medical: Not on file  Tobacco Use  . Smoking status: Current Every Day Smoker    Packs/day: 0.25    Years: 50.00    Pack years: 12.50    Types: Cigarettes  . Smokeless tobacco: Never Used  Substance and Sexual Activity  . Alcohol use: No  . Drug use: No  . Sexual activity: Yes    Partners: Male    Birth control/protection: Condom    Comment: mutual friends  Lifestyle  . Physical activity:    Days per week: Not on file    Minutes per session: Not on file  . Stress: Not on file  Relationships  . Social connections:    Talks on phone: Not on file    Gets together: Not on file    Attends religious service: Not on file    Active member of club or organization: Not on file    Attends meetings of clubs or organizations: Not on file    Relationship status: Not on file  . Intimate partner violence:    Fear of current or ex partner: Not on file    Emotionally abused: Not on file    Physically abused: Not on file    Forced sexual activity: Not on file  Other Topics Concern  . Not on file  Social History Narrative  . Not on file    Review of Systems: See HPI, otherwise negative ROS  Physical Exam: BP (!) 145/78   Pulse 78   Temp 98.3 F (36.8 C) (Oral)   Resp 20   Ht 5' 3" (1.6 m)   Wt 70.3 kg   SpO2 96%   BMI 27.46 kg/m  General:    Alert,  Well-developed, well-nourished, pleasant and cooperative in NAD Neck:  Supple; no masses or thyromegaly. No significant cervical adenopathy. Lungs:  Clear throughout to auscultation.   No wheezes, crackles, or rhonchi. No acute distress. Heart:  Regular rate and rhythm; no murmurs, clicks, rubs,  or gallops. Abdomen: Non-distended, normal bowel sounds.  Soft and nontender without appreciable mass or hepatosplenomegaly.  Pulses:  Normal pulses noted. Extremities:  Without clubbing or edema.  Impression/Plan: Dysphagia; iron deficiency anemia EGD with ED and colonoscopy per plan.  The risks, benefits, limitations, imponderables and alternatives regarding both EGD and colonoscopy have been reviewed with the patient. Questions have been answered. All parties agreeable.  She reports holding Plavix x5 days.     Notice: This dictation was prepared with Dragon dictation along with smaller phrase technology. Any transcriptional errors that result from this process are unintentional and may not be corrected upon review.

## 2018-02-15 ENCOUNTER — Encounter: Payer: Self-pay | Admitting: Internal Medicine

## 2018-02-18 ENCOUNTER — Encounter (HOSPITAL_COMMUNITY): Payer: Self-pay | Admitting: Internal Medicine

## 2018-02-21 DIAGNOSIS — E1165 Type 2 diabetes mellitus with hyperglycemia: Secondary | ICD-10-CM | POA: Diagnosis not present

## 2018-03-07 ENCOUNTER — Other Ambulatory Visit: Payer: Self-pay | Admitting: Internal Medicine

## 2018-03-07 MED ORDER — INSULIN ASPART 100 UNIT/ML FLEXPEN: 7.0000 [IU] | PEN_INJECTOR | Freq: Three times a day (TID) | SUBCUTANEOUS | 3 refills | Status: DC

## 2018-03-10 ENCOUNTER — Emergency Department (HOSPITAL_COMMUNITY): Payer: Medicare HMO

## 2018-03-10 ENCOUNTER — Inpatient Hospital Stay (HOSPITAL_COMMUNITY)
Admission: EM | Admit: 2018-03-10 | Discharge: 2018-03-13 | DRG: 871 | Disposition: A | Discharge status: Home / Self Care | Payer: Medicare HMO | Attending: Internal Medicine | Admitting: Internal Medicine

## 2018-03-10 ENCOUNTER — Encounter (HOSPITAL_COMMUNITY): Payer: Self-pay | Admitting: Emergency Medicine

## 2018-03-10 ENCOUNTER — Other Ambulatory Visit: Payer: Self-pay

## 2018-03-10 DIAGNOSIS — E1151 Type 2 diabetes mellitus with diabetic peripheral angiopathy without gangrene: Secondary | ICD-10-CM | POA: Diagnosis present

## 2018-03-10 DIAGNOSIS — E1142 Type 2 diabetes mellitus with diabetic polyneuropathy: Secondary | ICD-10-CM

## 2018-03-10 DIAGNOSIS — K921 Melena: Secondary | ICD-10-CM | POA: Diagnosis not present

## 2018-03-10 DIAGNOSIS — E871 Hypo-osmolality and hyponatremia: Secondary | ICD-10-CM | POA: Diagnosis present

## 2018-03-10 DIAGNOSIS — Z8719 Personal history of other diseases of the digestive system: Secondary | ICD-10-CM

## 2018-03-10 DIAGNOSIS — Z6827 Body mass index (BMI) 27.0-27.9, adult: Secondary | ICD-10-CM

## 2018-03-10 DIAGNOSIS — Z8249 Family history of ischemic heart disease and other diseases of the circulatory system: Secondary | ICD-10-CM

## 2018-03-10 DIAGNOSIS — I2510 Atherosclerotic heart disease of native coronary artery without angina pectoris: Secondary | ICD-10-CM | POA: Diagnosis not present

## 2018-03-10 DIAGNOSIS — I10 Essential (primary) hypertension: Secondary | ICD-10-CM | POA: Diagnosis not present

## 2018-03-10 DIAGNOSIS — Z794 Long term (current) use of insulin: Secondary | ICD-10-CM

## 2018-03-10 DIAGNOSIS — E785 Hyperlipidemia, unspecified: Secondary | ICD-10-CM | POA: Diagnosis present

## 2018-03-10 DIAGNOSIS — R9431 Abnormal electrocardiogram [ECG] [EKG]: Secondary | ICD-10-CM

## 2018-03-10 DIAGNOSIS — I739 Peripheral vascular disease, unspecified: Secondary | ICD-10-CM | POA: Diagnosis not present

## 2018-03-10 DIAGNOSIS — E872 Acidosis: Secondary | ICD-10-CM | POA: Diagnosis present

## 2018-03-10 DIAGNOSIS — K210 Gastro-esophageal reflux disease with esophagitis: Secondary | ICD-10-CM | POA: Diagnosis present

## 2018-03-10 DIAGNOSIS — Z7951 Long term (current) use of inhaled steroids: Secondary | ICD-10-CM | POA: Diagnosis not present

## 2018-03-10 DIAGNOSIS — J449 Chronic obstructive pulmonary disease, unspecified: Secondary | ICD-10-CM | POA: Diagnosis present

## 2018-03-10 DIAGNOSIS — K5792 Diverticulitis of intestine, part unspecified, without perforation or abscess without bleeding: Secondary | ICD-10-CM | POA: Diagnosis not present

## 2018-03-10 DIAGNOSIS — Z9049 Acquired absence of other specified parts of digestive tract: Secondary | ICD-10-CM | POA: Diagnosis not present

## 2018-03-10 DIAGNOSIS — Z888 Allergy status to other drugs, medicaments and biological substances status: Secondary | ICD-10-CM

## 2018-03-10 DIAGNOSIS — I959 Hypotension, unspecified: Secondary | ICD-10-CM | POA: Diagnosis not present

## 2018-03-10 DIAGNOSIS — Z7982 Long term (current) use of aspirin: Secondary | ICD-10-CM

## 2018-03-10 DIAGNOSIS — F17210 Nicotine dependence, cigarettes, uncomplicated: Secondary | ICD-10-CM | POA: Diagnosis present

## 2018-03-10 DIAGNOSIS — K529 Noninfective gastroenteritis and colitis, unspecified: Secondary | ICD-10-CM

## 2018-03-10 DIAGNOSIS — E782 Mixed hyperlipidemia: Secondary | ICD-10-CM | POA: Diagnosis present

## 2018-03-10 DIAGNOSIS — Z886 Allergy status to analgesic agent status: Secondary | ICD-10-CM

## 2018-03-10 DIAGNOSIS — Z833 Family history of diabetes mellitus: Secondary | ICD-10-CM

## 2018-03-10 DIAGNOSIS — R112 Nausea with vomiting, unspecified: Secondary | ICD-10-CM | POA: Diagnosis not present

## 2018-03-10 DIAGNOSIS — Z881 Allergy status to other antibiotic agents status: Secondary | ICD-10-CM

## 2018-03-10 DIAGNOSIS — A419 Sepsis, unspecified organism: Principal | ICD-10-CM | POA: Diagnosis present

## 2018-03-10 DIAGNOSIS — E860 Dehydration: Secondary | ICD-10-CM | POA: Diagnosis not present

## 2018-03-10 DIAGNOSIS — K5730 Diverticulosis of large intestine without perforation or abscess without bleeding: Secondary | ICD-10-CM | POA: Diagnosis not present

## 2018-03-10 DIAGNOSIS — E669 Obesity, unspecified: Secondary | ICD-10-CM | POA: Diagnosis present

## 2018-03-10 DIAGNOSIS — Z79890 Hormone replacement therapy (postmenopausal): Secondary | ICD-10-CM

## 2018-03-10 DIAGNOSIS — E876 Hypokalemia: Secondary | ICD-10-CM | POA: Diagnosis present

## 2018-03-10 DIAGNOSIS — E039 Hypothyroidism, unspecified: Secondary | ICD-10-CM | POA: Diagnosis present

## 2018-03-10 DIAGNOSIS — K5733 Diverticulitis of large intestine without perforation or abscess with bleeding: Secondary | ICD-10-CM | POA: Diagnosis not present

## 2018-03-10 DIAGNOSIS — I878 Other specified disorders of veins: Secondary | ICD-10-CM | POA: Diagnosis present

## 2018-03-10 DIAGNOSIS — K760 Fatty (change of) liver, not elsewhere classified: Secondary | ICD-10-CM | POA: Diagnosis present

## 2018-03-10 DIAGNOSIS — N179 Acute kidney failure, unspecified: Secondary | ICD-10-CM | POA: Diagnosis not present

## 2018-03-10 DIAGNOSIS — R000 Tachycardia, unspecified: Secondary | ICD-10-CM | POA: Diagnosis not present

## 2018-03-10 DIAGNOSIS — Z7902 Long term (current) use of antithrombotics/antiplatelets: Secondary | ICD-10-CM

## 2018-03-10 DIAGNOSIS — Z885 Allergy status to narcotic agent status: Secondary | ICD-10-CM

## 2018-03-10 DIAGNOSIS — Z79899 Other long term (current) drug therapy: Secondary | ICD-10-CM | POA: Diagnosis not present

## 2018-03-10 DIAGNOSIS — Z808 Family history of malignant neoplasm of other organs or systems: Secondary | ICD-10-CM

## 2018-03-10 DIAGNOSIS — Z955 Presence of coronary angioplasty implant and graft: Secondary | ICD-10-CM

## 2018-03-10 DIAGNOSIS — I4581 Long QT syndrome: Secondary | ICD-10-CM | POA: Diagnosis not present

## 2018-03-10 LAB — CBC WITH DIFFERENTIAL/PLATELET
Abs Immature Granulocytes: 0.13 10*3/uL — ABNORMAL HIGH (ref 0.00–0.07)
Basophils Absolute: 0.1 10*3/uL (ref 0.0–0.1)
Basophils Relative: 0 %
Eosinophils Absolute: 0 10*3/uL (ref 0.0–0.5)
Eosinophils Relative: 0 %
HCT: 45.7 % (ref 36.0–46.0)
Hemoglobin: 15.4 g/dL — ABNORMAL HIGH (ref 12.0–15.0)
Immature Granulocytes: 1 %
Lymphocytes Relative: 7 %
Lymphs Abs: 1.6 10*3/uL (ref 0.7–4.0)
MCH: 29.6 pg (ref 26.0–34.0)
MCHC: 33.7 g/dL (ref 30.0–36.0)
MCV: 87.9 fL (ref 80.0–100.0)
MONO ABS: 1.3 10*3/uL — AB (ref 0.1–1.0)
Monocytes Relative: 6 %
Neutro Abs: 18 10*3/uL — ABNORMAL HIGH (ref 1.7–7.7)
Neutrophils Relative %: 86 %
Platelets: 325 10*3/uL (ref 150–400)
RBC: 5.2 MIL/uL — ABNORMAL HIGH (ref 3.87–5.11)
RDW: 13.6 % (ref 11.5–15.5)
WBC: 21 10*3/uL — ABNORMAL HIGH (ref 4.0–10.5)
nRBC: 0 % (ref 0.0–0.2)

## 2018-03-10 LAB — URINALYSIS, ROUTINE W REFLEX MICROSCOPIC
Bilirubin Urine: NEGATIVE
Glucose, UA: NEGATIVE mg/dL
Ketones, ur: NEGATIVE mg/dL
Leukocytes, UA: NEGATIVE
Nitrite: NEGATIVE
PH: 5 (ref 5.0–8.0)
Protein, ur: 30 mg/dL — AB
Specific Gravity, Urine: 1.011 (ref 1.005–1.030)

## 2018-03-10 LAB — COMPREHENSIVE METABOLIC PANEL
ALBUMIN: 4.1 g/dL (ref 3.5–5.0)
ALT: 13 U/L (ref 0–44)
AST: 25 U/L (ref 15–41)
Alkaline Phosphatase: 120 U/L (ref 38–126)
Anion gap: 20 — ABNORMAL HIGH (ref 5–15)
BILIRUBIN TOTAL: 0.6 mg/dL (ref 0.3–1.2)
BUN: 36 mg/dL — ABNORMAL HIGH (ref 8–23)
CO2: 22 mmol/L (ref 22–32)
Calcium: 9.2 mg/dL (ref 8.9–10.3)
Chloride: 90 mmol/L — ABNORMAL LOW (ref 98–111)
Creatinine, Ser: 2.03 mg/dL — ABNORMAL HIGH (ref 0.44–1.00)
GFR calc Af Amer: 29 mL/min — ABNORMAL LOW (ref >60)
GFR calc non Af Amer: 25 mL/min — ABNORMAL LOW (ref >60)
Glucose, Bld: 286 mg/dL — ABNORMAL HIGH (ref 70–99)
Potassium: 2.4 mmol/L — CL (ref 3.5–5.1)
Sodium: 132 mmol/L — ABNORMAL LOW (ref 135–145)
Total Protein: 8.4 g/dL — ABNORMAL HIGH (ref 6.5–8.1)

## 2018-03-10 LAB — GLUCOSE, CAPILLARY
Glucose-Capillary: 236 mg/dL — ABNORMAL HIGH (ref 70–99)
Glucose-Capillary: 226 mg/dL — ABNORMAL HIGH (ref 70–99)

## 2018-03-10 LAB — LACTIC ACID, PLASMA
Lactic Acid, Venous: 3.5 mmol/L (ref 0.5–1.9)
Lactic Acid, Venous: 4.2 mmol/L (ref 0.5–1.9)
Lactic Acid, Venous: 1.2 mmol/L (ref 0.5–1.9)

## 2018-03-10 LAB — MAGNESIUM: Magnesium: 2.8 mg/dL — ABNORMAL HIGH (ref 1.7–2.4)

## 2018-03-10 MED ORDER — ENOXAPARIN SODIUM 30 MG/0.3ML ~~LOC~~ SOLN
30.0000 mg | SUBCUTANEOUS | Status: DC
  Administered 2018-03-10 – 2018-03-12 (×3): 30 mg via SUBCUTANEOUS
  Filled 2018-03-10 (×3): qty 0.3

## 2018-03-10 MED ORDER — LACTATED RINGERS IV BOLUS (SEPSIS)
1000.0000 mL | Freq: Once | INTRAVENOUS | Status: AC
  Administered 2018-03-10: 1000 mL via INTRAVENOUS

## 2018-03-10 MED ORDER — POTASSIUM CHLORIDE 10 MEQ/100ML IV SOLN
10.0000 meq | INTRAVENOUS | Status: AC
  Filled 2018-03-10: qty 100

## 2018-03-10 MED ORDER — SODIUM CHLORIDE 0.9 % IV BOLUS: 1000.0000 mL | Freq: Once | INTRAVENOUS | Status: DC

## 2018-03-10 MED ORDER — POTASSIUM CHLORIDE 10 MEQ/100ML IV SOLN
10.0000 meq | INTRAVENOUS | Status: AC
  Administered 2018-03-10 (×2): 10 meq via INTRAVENOUS
  Filled 2018-03-10: qty 100

## 2018-03-10 MED ORDER — CLOPIDOGREL BISULFATE 75 MG PO TABS
75.0000 mg | ORAL_TABLET | Freq: Every day | ORAL | Status: DC
  Administered 2018-03-10 – 2018-03-11 (×2): 75 mg via ORAL
  Filled 2018-03-10 (×2): qty 1

## 2018-03-10 MED ORDER — MAGNESIUM SULFATE 2 GM/50ML IV SOLN
2.0000 g | INTRAVENOUS | Status: AC
  Administered 2018-03-10: 2 g via INTRAVENOUS
  Filled 2018-03-10: qty 50

## 2018-03-10 MED ORDER — POTASSIUM CHLORIDE CRYS ER 20 MEQ PO TBCR
20.0000 meq | EXTENDED_RELEASE_TABLET | Freq: Two times a day (BID) | ORAL | Status: DC
  Administered 2018-03-11: 20 meq via ORAL
  Filled 2018-03-10: qty 1

## 2018-03-10 MED ORDER — LACTATED RINGERS IV BOLUS (SEPSIS)
250.0000 mL | Freq: Once | INTRAVENOUS | Status: AC
  Administered 2018-03-10: 250 mL via INTRAVENOUS

## 2018-03-10 MED ORDER — PROMETHAZINE HCL 25 MG/ML IJ SOLN
INTRAMUSCULAR | Status: AC
  Filled 2018-03-10: qty 1

## 2018-03-10 MED ORDER — SODIUM CHLORIDE 0.9 % IV BOLUS
500.0000 mL | Freq: Once | INTRAVENOUS | Status: AC
  Administered 2018-03-10: 500 mL via INTRAVENOUS

## 2018-03-10 MED ORDER — TIOTROPIUM BROMIDE MONOHYDRATE 18 MCG IN CAPS: 18.0000 ug | ORAL_CAPSULE | Freq: Every day | RESPIRATORY_TRACT | Status: DC

## 2018-03-10 MED ORDER — LEVOTHYROXINE SODIUM 100 MCG PO TABS
100.0000 ug | ORAL_TABLET | Freq: Every day | ORAL | Status: DC
  Administered 2018-03-11 – 2018-03-13 (×3): 100 ug via ORAL
  Filled 2018-03-10 (×3): qty 1

## 2018-03-10 MED ORDER — POTASSIUM CHLORIDE CRYS ER 20 MEQ PO TBCR
40.0000 meq | EXTENDED_RELEASE_TABLET | Freq: Once | ORAL | Status: AC
  Administered 2018-03-10: 40 meq via ORAL
  Filled 2018-03-10: qty 2

## 2018-03-10 MED ORDER — PROMETHAZINE HCL 25 MG/ML IJ SOLN
12.5000 mg | Freq: Once | INTRAMUSCULAR | Status: AC
  Administered 2018-03-10: 12.5 mg via INTRAMUSCULAR

## 2018-03-10 MED ORDER — POTASSIUM CHLORIDE 10 MEQ/100ML IV SOLN
10.0000 meq | Freq: Once | INTRAVENOUS | Status: AC
  Administered 2018-03-10: 10 meq via INTRAVENOUS
  Filled 2018-03-10: qty 100

## 2018-03-10 MED ORDER — PANTOPRAZOLE SODIUM 40 MG PO TBEC
40.0000 mg | DELAYED_RELEASE_TABLET | Freq: Every day | ORAL | Status: DC
  Administered 2018-03-10 – 2018-03-13 (×4): 40 mg via ORAL
  Filled 2018-03-10 (×4): qty 1

## 2018-03-10 MED ORDER — PROMETHAZINE HCL 25 MG/ML IJ SOLN: 12.5000 mg | Freq: Three times a day (TID) | INTRAMUSCULAR | Status: DC | PRN

## 2018-03-10 MED ORDER — UMECLIDINIUM BROMIDE 62.5 MCG/INH IN AEPB
1.0000 | INHALATION_SPRAY | Freq: Every day | RESPIRATORY_TRACT | Status: DC
  Administered 2018-03-13: 1 via RESPIRATORY_TRACT
  Filled 2018-03-10: qty 7

## 2018-03-10 MED ORDER — ACETAMINOPHEN 650 MG RE SUPP: 650.0000 mg | Freq: Four times a day (QID) | RECTAL | Status: DC | PRN

## 2018-03-10 MED ORDER — SODIUM CHLORIDE 0.9 % IV SOLN
2.0000 g | INTRAVENOUS | Status: DC
  Administered 2018-03-10 – 2018-03-12 (×3): 2 g via INTRAVENOUS
  Filled 2018-03-10 (×2): qty 20
  Filled 2018-03-10: qty 2
  Filled 2018-03-10: qty 20

## 2018-03-10 MED ORDER — METRONIDAZOLE IN NACL 5-0.79 MG/ML-% IV SOLN
500.0000 mg | Freq: Three times a day (TID) | INTRAVENOUS | Status: DC
  Administered 2018-03-10 – 2018-03-13 (×9): 500 mg via INTRAVENOUS
  Filled 2018-03-10 (×9): qty 100

## 2018-03-10 MED ORDER — ASPIRIN EC 81 MG PO TBEC
81.0000 mg | DELAYED_RELEASE_TABLET | Freq: Every day | ORAL | Status: DC
  Administered 2018-03-10 – 2018-03-11 (×2): 81 mg via ORAL
  Filled 2018-03-10 (×2): qty 1

## 2018-03-10 MED ORDER — POTASSIUM CHLORIDE IN NACL 40-0.9 MEQ/L-% IV SOLN
INTRAVENOUS | Status: DC
  Administered 2018-03-10 – 2018-03-11 (×2): 100 mL/h via INTRAVENOUS
  Filled 2018-03-10 (×3): qty 1000

## 2018-03-10 MED ORDER — INSULIN ASPART 100 UNIT/ML ~~LOC~~ SOLN
0.0000 [IU] | SUBCUTANEOUS | Status: DC
  Administered 2018-03-10 – 2018-03-11 (×2): 3 [IU] via SUBCUTANEOUS
  Administered 2018-03-11: 1 [IU] via SUBCUTANEOUS
  Administered 2018-03-11: 2 [IU] via SUBCUTANEOUS

## 2018-03-10 MED ORDER — MORPHINE SULFATE (PF) 2 MG/ML IV SOLN: 2.0000 mg | Freq: Four times a day (QID) | INTRAVENOUS | Status: DC | PRN

## 2018-03-10 MED ORDER — ACETAMINOPHEN 325 MG PO TABS
650.0000 mg | ORAL_TABLET | Freq: Four times a day (QID) | ORAL | Status: DC | PRN
  Filled 2018-03-10: qty 2

## 2018-03-10 NOTE — ED Notes (Signed)
Call from lab   Critical K 2.4  Dr Sabra Heck called and texted

## 2018-03-10 NOTE — H&P (Addendum)
History and Physical    MARCHA LICKLIDER ZOX:[id number] DOB: 02/10/1950 DOA: 03/10/2018  PCP: Mikey Kirschner, MD   Patient coming from: Home  I have personally briefly reviewed patient's old medical records in [place]  Chief Complaint: Abdominal pain, Diarrhea and vomiting  HPI: Rachel [name] is a 68 y.o. female with medical history significant for HTN2, hypothyroidism, COPD, presented to the ED with complaints of abdominal pain that started this morning-mostly lower abdomen.  Since patient screening colonoscopy on the 1/9, weeks ago, she has had to bear down harder to have a bowel movement.  Today after bearing down she had a bowel movement and then 2 subsequent bowel movements the third of which was loose and dark.  She subsequently had multiple episodes of vomiting since then.  No fever or chills.  No burning with urination.  On EMS arrival patient blood pressure systolic was in the 81X to 60s.  ED Course: Blood pressure down to 76/50 on arrival.  Heart rate 108.  Leukocytosis 21.  Hypokalemia 2.4.  Elevated creatinine 2, baseline 0.9.  Click acidosis 3.5.  EKG sinus tachycardia, prolonged QTc 526.  Abdominal CT-shows left colonic diverticulosis, with pericolonic edema/inflammation, with features compatible with diverticulitis.  No perforation or abscess.  Patient was started on IV ceftriaxone and metronidazole.  Hospitalist to admit for diverticulitis and AKI.  Review of Systems: As per HPI other systems reviewed and negative.  Past Medical History:  Diagnosis Date  . Asthma   . Coronary atherosclerosis of native coronary artery    DES x 2 (overlapping) RCA 03/2008 - patent 2011  . Diabetes type 2, controlled ([place]) 2000  . Essential hypertension, benign   . Fatty liver   . GERD (gastroesophageal reflux disease)   . Hyperlipidemia   . Microalbuminuria   . Obesity   . Odynophagia    Severe esophagitis on CT 05/2010, treated empirically with Diflucan  . PAD (peripheral  artery disease) (HCC)    history of left greater than right lower 70 claudication  . Tubular adenoma of colon 07/2007   Multiple colonic polyps  . Venous stasis     Past Surgical History:  Procedure Laterality Date  . BIOPSY  02/14/2018   Procedure: BIOPSY;  Surgeon: Rachel Dolin, MD;  [facility];  Service: Endoscopy;;  gastric bx's  . CARDIAC CATHETERIZATION     stents  . CHOLECYSTECTOMY N/A 07/30/2015   Procedure: LAPAROSCOPIC CHOLECYSTECTOMY;  Surgeon: Aviva Signs, MD;  Location: [facility];  Service: General;  Laterality: N/A;  . COLONOSCOPY W/ POLYPECTOMY  07/2007   Tubular adenoma  . COLONOSCOPY WITH PROPOFOL N/A 02/14/2018   Procedure: COLONOSCOPY WITH PROPOFOL;  Surgeon: Rachel Dolin, MD;  [facility];  Service: Endoscopy;  Laterality: N/A;  7:30am  . CORONARY STENT PLACEMENT    . CYSTECTOMY     Pilonidal  . ESOPHAGOGASTRODUODENOSCOPY  04/2010   RMR: Normal esophagus , Stomach D1 and D2   . ESOPHAGOGASTRODUODENOSCOPY (EGD) WITH PROPOFOL N/A 02/14/2018   Procedure: ESOPHAGOGASTRODUODENOSCOPY (EGD) WITH PROPOFOL;  Surgeon: Rachel Dolin, MD;  [facility];  Service: Endoscopy;  Laterality: N/A;  . LOWER EXTREMITY ANGIOGRAPHY N/A 03/13/2016   Procedure: Lower Extremity Angiography;  Surgeon: [name], MD;  Location: [place] [facility];  Service: Cardiovascular;  Laterality: N/A;  . Venia Minks DILATION N/A 02/14/2018   Procedure: Venia Minks DILATION;  Surgeon: Rachel Dolin, MD;  [facility];  Service: Endoscopy;  Laterality: N/A;  . PERIPHERAL VASCULAR ATHERECTOMY  03/27/2016   Procedure: Peripheral Vascular Atherectomy;  Surgeon: [name], MD;  Location: [place] [facility];  Service: Cardiovascular;;  Attempted atherectomy  . PERIPHERAL VASCULAR CATHETERIZATION Bilateral 09/28/2014   Procedure: Lower Extremity Angiography;  Surgeon: [name], MD;  Location: [place] [facility];  Service: Cardiovascular;  Laterality:  Bilateral;  . PERIPHERAL VASCULAR CATHETERIZATION N/A 09/28/2014   Procedure: Abdominal Aortogram;  Surgeon: [name], MD;  Location: [place] [facility];  Service: Cardiovascular;  Laterality: N/A;  . PERIPHERAL VASCULAR CATHETERIZATION N/A 10/15/2014   Procedure: Lower Extremity Angiography;  Surgeon: [name], MD;  Location: [place] [facility];  Service: Cardiovascular;  Laterality: N/A;  . PERIPHERAL VASCULAR CATHETERIZATION  10/15/2014   Procedure: Peripheral Vascular Intervention;  Surgeon: [name], MD;  Location: [place] [facility];  Service: Cardiovascular;;  bilateral iliac stents  . PILONIDAL CYST / SINUS EXCISION    . POLYPECTOMY  02/14/2018   Procedure: POLYPECTOMY;  Surgeon: Rachel Dolin, MD;  [facility];  Service: Endoscopy;;  ascending colon polyps x2, descending polyps x3  . TUBAL LIGATION Bilateral      reports that she has been smoking cigarettes. She has a 12.50 pack-year smoking history. She has never used smokeless tobacco. She reports that she does not drink alcohol or use drugs.  Allergies  Allergen Reactions  . Altace [Ramipril] Cough  . Biaxin [Clarithromycin] Other (See Comments)    Fatigue  . Niacin And Related Other (See Comments)    Flush - felt like on fire.  . Nsaids Other (See Comments)    feverish  . Tramadol Other (See Comments)    Lethargic   . Adhesive [Tape] Rash and Other (See Comments)    Paper tape is ok    Family History  Problem Relation Age of Onset  . Bone cancer Father   . Heart disease Maternal Grandmother   . Hypertension Mother   . Diabetes Mother   . Colon cancer Neg Hx   . Liver disease Neg Hx   . Inflammatory bowel disease Neg Hx     Prior to Admission medications   Medication Sig Start Date End Date Taking? Authorizing Provider  albuterol (PROAIR HFA) 108 (90 Base) MCG/ACT inhaler Inhale 2 puffs into the lungs every 4 (four) hours as needed for wheezing or shortness of breath. 05/10/17  Yes  Collene Gobble, MD  aspirin EC 81 MG tablet Take 81 mg by mouth daily.     Yes [provider]  clopidogrel (PLAVIX) 75 MG tablet Take 1 tablet (75 mg total) by mouth daily. 12/20/17  Yes [name], MD  fluticasone (FLONASE) 50 MCG/ACT nasal spray Place 2 sprays into both nostrils 2 (two) times daily. Patient taking differently: Place 2 sprays into both nostrils daily.  12/21/17  Yes Collene Gobble, MD  gabapentin (NEURONTIN) 400 MG capsule TAKE ONE CAPSULE BY MOUTH AT BEDTIME AS DIRECTED Patient taking differently: Take 400 mg by mouth at bedtime.  12/11/17  Yes Mikey Kirschner, MD  hydrochlorothiazide (MICROZIDE) 12.5 MG capsule TAKE 1 CAPSULE BY MOUTH EVERY DAY 01/29/18  Yes Mikey Kirschner, MD  insulin aspart (NOVOLOG FLEXPEN) 100 UNIT/ML FlexPen Inject 7 Units into the skin 3 (three) times daily with meals. 03/07/18  Yes Shamleffer, Melanie Crazier, MD  Insulin Glargine (LANTUS SOLOSTAR) 100 UNIT/ML Solostar Pen Inject 28 Units into the skin daily. 02/04/18  Yes Shamleffer,  Melanie Crazier, MD  levothyroxine (SYNTHROID, LEVOTHROID) 100 MCG tablet TAKE 1 TABLET BY MOUTH EVERY DAY BEFORE BREAKFAST Patient taking differently: Take 100 mcg by mouth daily before breakfast.  09/19/17  Yes Nida, Marella Chimes, MD  meclizine (ANTIVERT) 25 MG tablet Take 1 tablet (25 mg total) by mouth 3 (three) times daily as needed for dizziness. 05/23/17  Yes Mikey Kirschner, MD  metFORMIN (GLUCOPHAGE XR) 500 MG 24 hr tablet Take 1 tablet (500 mg total) by mouth 2 (two) times daily with a meal. 02/04/18  Yes Shamleffer, Melanie Crazier, MD  pantoprazole (PROTONIX) 40 MG tablet  02/21/18  Yes [provider]  potassium chloride SA (K-DUR,KLOR-CON) 20 MEQ tablet Take 1 tablet (20 mEq total) by mouth 2 (two) times daily. 11/06/17  Yes Daleen Bo, MD  rosuvastatin (CRESTOR) 40 MG tablet TAKE 1 TABLET EVERY DAY AT BEDTIME Patient taking differently: Take 40 mg by mouth at bedtime.   12/20/17  Yes [name], MD  Semaglutide,0.25 or 0.5MG /DOS, (OZEMPIC, 0.25 OR 0.5 MG/DOSE,) 2 MG/1.5ML SOPN Inject 0.25 mg into the skin once a week. 02/04/18  Yes Shamleffer, Melanie Crazier, MD  tiotropium (SPIRIVA) 18 MCG inhalation capsule Place 1 capsule (18 mcg total) into inhaler and inhale daily. 01/24/18  Yes Collene Gobble, MD    Physical Exam: Vitals:   03/10/18 1400 03/10/18 1430 03/10/18 1500 03/10/18 1530  BP: 99/74 112/84 109/72 123/66  Pulse:   91 88  Resp:   14 16  Temp:      TempSrc:      SpO2:   97% 95%  Weight:      Height:        Constitutional: Lethargic, but answers questions appropriately Vitals:   03/10/18 1400 03/10/18 1430 03/10/18 1500 03/10/18 1530  BP: 99/74 112/84 109/72 123/66  Pulse:   91 88  Resp:   14 16  Temp:      TempSrc:      SpO2:   97% 95%  Weight:      Height:       Eyes: PERRL, lids and conjunctivae normal ENMT: Mucous membranes are dry. Posterior pharynx clear of any exudate or lesions..  Neck: normal, supple, no masses, no thyromegaly Respiratory: clear to auscultation bilaterally, no wheezing, no crackles. Normal respiratory effort. No accessory muscle use.  Cardiovascular: Regular rate and rhythm, no murmurs / rubs / gallops. No extremity edema. 2+ pedal pulses. No carotid bruits.  Abdomen: Moderate tenderness epigastric and left lower quadrant,, no masses palpated. No hepatosplenomegaly. Bowel sounds positive.  Musculoskeletal: no clubbing / cyanosis. No joint deformity upper and lower extremities. Good ROM, no contractures. Normal muscle tone.  Skin: no rashes, lesions, ulcers. No induration Neurologic: CN 2-12 grossly intact. Sensation intact, DTR normal. Strength 5/5 in all 4.  Psychiatric: Normal judgment and insight.  Lethargic but oriented x 3.   Labs on Admission: I have personally reviewed following labs and imaging studies  CBC: Recent Labs  Lab 03/10/18 1256  WBC 21.0*  NEUTROABS 18.0*  HGB 15.4*    HCT 45.7  MCV 87.9  PLT 161   Basic Metabolic Panel: Recent Labs  Lab 03/10/18 1256  NA 132*  K 2.4*  CL 90*  CO2 22  GLUCOSE 286*  BUN 36*  CREATININE 2.03*  CALCIUM 9.2   GFR: Estimated Creatinine Clearance: 24.8 mL/min (A) (by C-G formula based on SCr of 2.03 mg/dL (H)). Liver Function Tests: Recent Labs  Lab 03/10/18 1256  AST 25  ALT 13  ALKPHOS 120  BILITOT 0.6  PROT 8.4*  ALBUMIN 4.1    Radiological Exams on Admission: Ct Abdomen Pelvis Wo Contrast  Result Date: 03/10/2018 CLINICAL DATA:  Flu like symptoms. Nausea vomiting and diarrhea. EXAM: CT ABDOMEN AND PELVIS WITHOUT CONTRAST TECHNIQUE: Multidetector CT imaging of the abdomen and pelvis was performed following the standard protocol without IV contrast. COMPARISON:  07/23/2015 FINDINGS: Lower chest: Coronary artery calcification is evident. Hepatobiliary: No focal abnormality in the liver on this study without intravenous contrast. Gallbladder surgically absent. No intrahepatic or extrahepatic biliary dilation. Pancreas: Unremarkable. No pancreatic ductal dilatation or surrounding inflammatory changes. Spleen: Normal in size without focal abnormality. Adrenals/Urinary Tract: No adrenal nodule or mass. stable 11 mm exophytic lesion upper pole right kidney. 8 mm exophytic lesion lower pole left kidney is similar to prior. No hydronephrosis. No evidence for hydroureter. The urinary bladder appears normal for the degree of distention. Stomach/Bowel: Stomach is unremarkable. No gastric wall thickening. No evidence of outlet obstruction. Duodenum is normally positioned as is the ligament of Treitz. No small bowel wall thickening. No small bowel dilatation. The terminal ileum is normal. The appendix is normal. Diverticular changes are noted in the left colon however there is some pericolonic edema/inflammation associated with the distal descending and proximal sigmoid segments. Vascular/Lymphatic: There is abdominal aortic  atherosclerosis without aneurysm. Bilateral common iliac artery stents evident. There is no gastrohepatic or hepatoduodenal ligament lymphadenopathy. No intraperitoneal or retroperitoneal lymphadenopathy. No pelvic sidewall lymphadenopathy. Reproductive: The uterus is unremarkable.  There is no adnexal mass. Other: No intraperitoneal free fluid. Musculoskeletal: No worrisome lytic or sclerotic osseous abnormality. IMPRESSION: 1. Left colonic diverticulosis with pericolonic edema/inflammation around the distal descending and proximal sigmoid segments. Imaging features are compatible with diverticulitis. No perforation or abscess. 2. Stable appearance bilateral renal lesions, likely cysts. 3. Aortic Atherosclerois (ICD10-170.0) Electronically Signed   By: Misty Stanley M.D.   On: 03/10/2018 15:26    EKG: Independently reviewed.  Sinus tach.  QTC prolonged 526.  Assessment/Plan Active Problems:   Diverticulitis  Diverticulitis-abdominal pain, more significant vomiting than loose stool.  Leukocytosis 21.  Hypotensive likely from fluid losses, lactic acidosis 3.5 > 4.2 S/p 2.250L RL  and AKI.  CT Wo contrast diverticulosis with diverticulitis suggested. Recent EGD and colonoscopy-multipk,e diverticula, 3 sessile polyps- Tubular Adenoma, no high-grade dysplasia. -Continue IV ceftriaxone and metronidazole started in ED -Trend Lactic acid -Additional 1 L bolus - N/s + 40Kcl 100cc/hr x 15hrs -BMP, CBC a.m. -Rest with clear liquid diet  -IV morphine PRN -Follow-up blood cultures drawn in ED - F/u Stool c.diff  AKI-creatinine 2, baseline 0.9.  Likely prerenal, from vomiting, likely poor renal perfusion also from hypotension. -Hydrate -BMP a.m.  Hypokalemia-potassium 2.4.  From GI losses, with HCTZ use. Also chronic on daily supplimentation. -Magnesium check- 2.8. -Replete -Monitor K  Prolonged QTC-526.  From hypokalemia. -EKG a.m. -Phenergan IV as needed  DM2-glucose random 286. 11/2017  Hgba1c-8. -Hold home metformin, semaglutide - SSI  HTN- systolic initially in the 70s improved to 112 after IV fluid -Hold home HCTZ.  COPD-stable -Continue home bronchodilators  CAD, PAD- s/p RCA DES 2010, bilateral iliac artery stents.  Follows with Dr. Gwenlyn Found. -Continue home Plavix  DVT prophylaxis: Lovenox Code Status: Full Family Communication: Daughter at bedside Disposition Plan:  Per rounding team Consults called: None Admission status: Obs, tele   Bethena Roys MD [facility]  03/10/2018, 4:39 PM

## 2018-03-10 NOTE — ED Notes (Signed)
From CT 

## 2018-03-10 NOTE — ED Triage Notes (Signed)
Brought in by [facility] for flu like sx    N/V/D

## 2018-03-10 NOTE — ED Notes (Signed)
Awaiting bed ready 

## 2018-03-10 NOTE — ED Provider Notes (Signed)
[facility] EMERGENCY DEPARTMENT Provider Note   CSN: [id number] Arrival date & time: 03/10/18  1236     History   Chief Complaint Chief Complaint  Patient presents with  . flu like sx    HPI Rachel [name] is a 68 y.o. female.  HPI  The patient is a 68 year old female, she has a known history of coronary disease status post stenting in 2010 in 2011 had another angiogram which showed patent stents.  She has diabetes type 2, hypertension, and recently had a colonoscopy on January 9 of 2020.  She reports that since that time she had had constipation, this morning she was straining to have bowel movements, she finally had a bowel movement however she developed severe nausea and vomiting multiple times and then started to have more stool, she denies taking a stool softener.  Paramedics found the patient to be very weak, hypotensive with blood pressures no higher than 60 systolic, ill-appearing, they were able to place a 22-gauge IV and give Zofran prehospital.  The patient states this has not really helped, she continues to be nauseated, she denies much in the way of abdominal pain.  She denies any blood in the stools.  She denies any prior abdominal surgery.  She does report that she was told she had a couple of precancerous polyps.  Past Medical History:  Diagnosis Date  . Asthma   . Coronary atherosclerosis of native coronary artery    DES x 2 (overlapping) RCA 03/2008 - patent 2011  . Diabetes type 2, controlled ([place]) 2000  . Essential hypertension, benign   . Fatty liver   . GERD (gastroesophageal reflux disease)   . Hyperlipidemia   . Microalbuminuria   . Obesity   . Odynophagia    Severe esophagitis on CT 05/2010, treated empirically with Diflucan  . PAD (peripheral artery disease) (HCC)    history of left greater than right lower 70 claudication  . Tubular adenoma of colon 07/2007   Multiple colonic polyps  . Venous stasis     Patient Active Problem List   Diagnosis  Date Noted  . Pulmonary nodules 01/24/2018  . Generalized weakness 11/13/2017  . Allergic rhinitis 06/26/2017  . Carotid artery disease ([place]) 06/20/2017  . COPD (chronic obstructive pulmonary disease) ([place]) 05/10/2017  . Vitamin D deficiency 02/15/2017  . Personal history of noncompliance with medical treatment, presenting hazards to health 03/30/2016  . Acute cholecystitis 07/23/2015  . Pseudoaneurysm of left femoral artery ([place]) 10/17/2014  . Claudication ([place]) 10/15/2014  . Hypokalemia 08/28/2014  . AKI (acute kidney injury) ([place]) 08/28/2014  . Diarrhea 08/28/2014  . High anion gap metabolic acidosis 50/10/3816  . Hypothyroidism 02/07/2014  . Essential hypertension, benign 10/13/2013  . Peripheral arterial disease ([place]) 05/22/2013  . Meniere's disease 04/07/2013  . Type 2 diabetes mellitus with diabetic polyneuropathy, with long-term current use of insulin ([place]) 03/11/2009  . Mixed hyperlipidemia 03/11/2009  . TOBACCO ABUSE 03/11/2009  . Coronary atherosclerosis of native coronary artery 03/11/2009  . GERD (gastroesophageal reflux disease) 03/11/2009    Past Surgical History:  Procedure Laterality Date  . BIOPSY  02/14/2018   Procedure: BIOPSY;  Surgeon: Daneil Dolin, MD;  Location: [facility];  Service: Endoscopy;;  gastric bx's  . CARDIAC CATHETERIZATION     stents  . CHOLECYSTECTOMY N/A 07/30/2015   Procedure: LAPAROSCOPIC CHOLECYSTECTOMY;  Surgeon: Aviva Signs, MD;  Location: [facility] ORS;  Service: General;  Laterality: N/A;  . COLONOSCOPY W/ POLYPECTOMY  07/2007  Tubular adenoma  . COLONOSCOPY WITH PROPOFOL N/A 02/14/2018   Procedure: COLONOSCOPY WITH PROPOFOL;  Surgeon: Daneil Dolin, MD;  Location: [facility];  Service: Endoscopy;  Laterality: N/A;  7:30am  . CORONARY STENT PLACEMENT    . CYSTECTOMY     Pilonidal  . ESOPHAGOGASTRODUODENOSCOPY  04/2010   RMR: Normal esophagus , Stomach D1 and D2   . ESOPHAGOGASTRODUODENOSCOPY (EGD) WITH PROPOFOL N/A 02/14/2018    Procedure: ESOPHAGOGASTRODUODENOSCOPY (EGD) WITH PROPOFOL;  Surgeon: Daneil Dolin, MD;  [facility];  Service: Endoscopy;  Laterality: N/A;  . LOWER EXTREMITY ANGIOGRAPHY N/A 03/13/2016   Procedure: Lower Extremity Angiography;  Surgeon: [name], MD;  Location: [place] [facility];  Service: Cardiovascular;  Laterality: N/A;  . Venia Minks DILATION N/A 02/14/2018   Procedure: Venia Minks DILATION;  Surgeon: Daneil Dolin, MD;  [facility];  Service: Endoscopy;  Laterality: N/A;  . PERIPHERAL VASCULAR ATHERECTOMY  03/27/2016   Procedure: Peripheral Vascular Atherectomy;  Surgeon: [name], MD;  Location: [place] [facility];  Service: Cardiovascular;;  Attempted atherectomy  . PERIPHERAL VASCULAR CATHETERIZATION Bilateral 09/28/2014   Procedure: Lower Extremity Angiography;  Surgeon: [name], MD;  Location: [place] [facility];  Service: Cardiovascular;  Laterality: Bilateral;  . PERIPHERAL VASCULAR CATHETERIZATION N/A 09/28/2014   Procedure: Abdominal Aortogram;  Surgeon: [name], MD;  Location: [place] [facility];  Service: Cardiovascular;  Laterality: N/A;  . PERIPHERAL VASCULAR CATHETERIZATION N/A 10/15/2014   Procedure: Lower Extremity Angiography;  Surgeon: [name], MD;  Location: [place] [facility];  Service: Cardiovascular;  Laterality: N/A;  . PERIPHERAL VASCULAR CATHETERIZATION  10/15/2014   Procedure: Peripheral Vascular Intervention;  Surgeon: [name], MD;  Location: [place] [facility];  Service: Cardiovascular;;  bilateral iliac stents  . PILONIDAL CYST / SINUS EXCISION    . POLYPECTOMY  02/14/2018   Procedure: POLYPECTOMY;  Surgeon: Daneil Dolin, MD;  [facility];  Service: Endoscopy;;  ascending colon polyps x2, descending polyps x3  . TUBAL LIGATION Bilateral      OB History   No obstetric history on file.      Home Medications    Prior to Admission medications   Medication Sig Start Date End Date  Taking? Authorizing Provider  albuterol (PROAIR HFA) 108 (90 Base) MCG/ACT inhaler Inhale 2 puffs into the lungs every 4 (four) hours as needed for wheezing or shortness of breath. 05/10/17   Collene Gobble, MD  aspirin EC 81 MG tablet Take 81 mg by mouth daily.      [provider]  clopidogrel (PLAVIX) 75 MG tablet Take 1 tablet (75 mg total) by mouth daily. 12/20/17   [name], MD  fluticasone (FLONASE) 50 MCG/ACT nasal spray Place 2 sprays into both nostrils 2 (two) times daily. Patient taking differently: Place 2 sprays into both nostrils daily.  12/21/17   Collene Gobble, MD  gabapentin (NEURONTIN) 400 MG capsule TAKE ONE CAPSULE BY MOUTH AT BEDTIME AS DIRECTED Patient taking differently: Take 400 mg by mouth at bedtime.  12/11/17   Mikey Kirschner, MD  hydrochlorothiazide (MICROZIDE) 12.5 MG capsule TAKE 1 CAPSULE BY MOUTH EVERY DAY 01/29/18   Mikey Kirschner, MD  insulin aspart (NOVOLOG FLEXPEN) 100 UNIT/ML FlexPen Inject 7 Units into the skin 3 (three) times daily with meals. 03/07/18   Shamleffer, Melanie Crazier, MD  Insulin Glargine (LANTUS SOLOSTAR) 100 UNIT/ML Solostar Pen Inject 28 Units  into the skin daily. 02/04/18   Shamleffer, Melanie Crazier, MD  levothyroxine (SYNTHROID, LEVOTHROID) 100 MCG tablet TAKE 1 TABLET BY MOUTH EVERY DAY BEFORE BREAKFAST Patient taking differently: Take 100 mcg by mouth daily before breakfast.  09/19/17   Nida, Marella Chimes, MD  meclizine (ANTIVERT) 25 MG tablet Take 1 tablet (25 mg total) by mouth 3 (three) times daily as needed for dizziness. 05/23/17   Mikey Kirschner, MD  metFORMIN (GLUCOPHAGE XR) 500 MG 24 hr tablet Take 1 tablet (500 mg total) by mouth 2 (two) times daily with a meal. 02/04/18   Shamleffer, Melanie Crazier, MD  Na Sulfate-K Sulfate-Mg Sulf 17.5-3.13-1.6 GM/177ML SOLN Take 1 kit by mouth as directed. 12/26/17   Rourk, Cristopher Estimable, MD  potassium chloride SA (K-DUR,KLOR-CON) 20 MEQ tablet Take 1 tablet (20 mEq  total) by mouth 2 (two) times daily. 11/06/17   Daleen Bo, MD  rosuvastatin (CRESTOR) 40 MG tablet TAKE 1 TABLET EVERY DAY AT BEDTIME Patient taking differently: Take 40 mg by mouth at bedtime.  12/20/17   [name], MD  Semaglutide,0.25 or 0.5MG/DOS, (OZEMPIC, 0.25 OR 0.5 MG/DOSE,) 2 MG/1.5ML SOPN Inject 0.25 mg into the skin once a week. 02/04/18   Shamleffer, Melanie Crazier, MD  tiotropium (SPIRIVA) 18 MCG inhalation capsule Place 1 capsule (18 mcg total) into inhaler and inhale daily. 01/24/18   Collene Gobble, MD    Family History Family History  Problem Relation Age of Onset  . Bone cancer Father   . Heart disease Maternal Grandmother   . Hypertension Mother   . Diabetes Mother   . Colon cancer Neg Hx   . Liver disease Neg Hx   . Inflammatory bowel disease Neg Hx     Social History Social History   Tobacco Use  . Smoking status: Current Every Day Smoker    Packs/day: 0.25    Years: 50.00    Pack years: 12.50    Types: Cigarettes  . Smokeless tobacco: Never Used  Substance Use Topics  . Alcohol use: No  . Drug use: No     Allergies   Altace [ramipril]; Biaxin [clarithromycin]; Niacin and related; Nsaids; Tramadol; and Adhesive [tape]   Review of Systems Review of Systems  All other systems reviewed and are negative.    Physical Exam Updated Vital Signs BP (!) 76/56 (BP Location: Left Arm)   Pulse (!) 108   Temp 97.8 F (36.6 C) (Oral)   Resp 16   SpO2 96%   Physical Exam Vitals signs and nursing note reviewed.  Constitutional:      Appearance: She is well-developed. She is ill-appearing.  HENT:     Head: Normocephalic and atraumatic.     Mouth/Throat:     Mouth: Mucous membranes are dry.     Pharynx: No oropharyngeal exudate or posterior oropharyngeal erythema.  Eyes:     General: No scleral icterus.       Right eye: No discharge.        Left eye: No discharge.     Conjunctiva/sclera: Conjunctivae normal.     Pupils: Pupils are  equal, round, and reactive to light.  Neck:     Musculoskeletal: Normal range of motion and neck supple.     Thyroid: No thyromegaly.     Vascular: No JVD.  Cardiovascular:     Rate and Rhythm: Regular rhythm. Tachycardia present.     Heart sounds: Normal heart sounds. No murmur. No friction rub. No gallop.   Pulmonary:  Effort: Pulmonary effort is normal. No respiratory distress.     Breath sounds: Normal breath sounds. No wheezing or rales.  Abdominal:     General: Bowel sounds are normal. There is no distension.     Palpations: Abdomen is soft. There is no mass.     Tenderness: There is no abdominal tenderness.  Musculoskeletal: Normal range of motion.        General: No tenderness.  Lymphadenopathy:     Cervical: No cervical adenopathy.  Skin:    General: Skin is warm and dry.     Findings: No erythema or rash.  Neurological:     Mental Status: She is alert.     Coordination: Coordination normal.  Psychiatric:        Behavior: Behavior normal.      ED Treatments / Results  Labs (all labs ordered are listed, but only abnormal results are displayed) Labs Reviewed  CULTURE, BLOOD (ROUTINE X 2)  CULTURE, BLOOD (ROUTINE X 2)  LACTIC ACID, PLASMA  LACTIC ACID, PLASMA  COMPREHENSIVE METABOLIC PANEL  CBC WITH DIFFERENTIAL/PLATELET  URINALYSIS, ROUTINE W REFLEX MICROSCOPIC    EKG None  Radiology No results found.  Procedures .Critical Care Performed by: Noemi Chapel, MD Authorized by: Noemi Chapel, MD   Critical care provider statement:    Critical care time (minutes):  35   Critical care time was exclusive of:  Separately billable procedures and treating other patients and teaching time   Critical care was necessary to treat or prevent imminent or life-threatening deterioration of the following conditions:  Renal failure and shock   Critical care was time spent personally by me on the following activities:  Blood draw for specimens, development of treatment  plan with patient or surrogate, discussions with consultants, evaluation of patient's response to treatment, examination of patient, obtaining history from patient or surrogate, ordering and performing treatments and interventions, ordering and review of laboratory studies, ordering and review of radiographic studies, pulse oximetry, re-evaluation of patient's condition and review of old charts   (including critical care time)  Medications Ordered in ED Medications  lactated ringers bolus 1,000 mL (has no administration in time range)    And  lactated ringers bolus 1,000 mL (has no administration in time range)    And  lactated ringers bolus 250 mL (has no administration in time range)     Initial Impression / Assessment and Plan / ED Course  I have reviewed the triage vital signs and the nursing notes.  Pertinent labs & imaging results that were available during my care of the patient were reviewed by me and considered in my medical decision making (see chart for details).  Clinical Course as of Mar 11 1531  Sun Mar 10, 2018  1508 Due to the patient's persistent hypotension though it is improving as well as her leukocytosis of 21,000 and an elevated lactic acid of 3.5 antibiotics will be ordered though it is unclear whether this is true sepsis or whether this is more related to her acute gastroenteritis which may very well might be bacterial.   [BM]    Clinical Course User Index [BM] Noemi Chapel, MD    The patient is pale, pulses are weak, she is hypotensive at 80 systolic, she will be given IV fluids, this may just be related to a vagal type event however given the significant vomiting, multiple episodes of stool and her persistent hypotension she will need to have further evaluation and fluid resuscitation.  She has  a nonsurgical abdomen at this time  CT scan confirms that the patient has some pericolonic inflammation, I discussed this with the hospitalist, they will  admit.  Final Clinical Impressions(s) / ED Diagnoses   Final diagnoses:  AKI (acute kidney injury) ([place])  Hypokalemia  Prolonged Q-T interval on ECG  Acute colitis      Noemi Chapel, MD 03/10/18 1534

## 2018-03-10 NOTE — ED Notes (Signed)
CRITICAL VALUE ALERT  Critical Value:  Lactic 3.5  Date & Time Notied:  03/10/2018, 5868  Provider Notified: Dr. Sabra Heck  Orders Received/Actions taken: no new orders

## 2018-03-10 NOTE — ED Notes (Signed)
Call to floor 

## 2018-03-10 NOTE — ED Notes (Signed)
Lab has drawn second lactic acid

## 2018-03-10 NOTE — ED Notes (Signed)
To CT

## 2018-03-11 DIAGNOSIS — F17210 Nicotine dependence, cigarettes, uncomplicated: Secondary | ICD-10-CM | POA: Diagnosis present

## 2018-03-11 DIAGNOSIS — K760 Fatty (change of) liver, not elsewhere classified: Secondary | ICD-10-CM | POA: Diagnosis present

## 2018-03-11 DIAGNOSIS — E872 Acidosis: Secondary | ICD-10-CM | POA: Diagnosis present

## 2018-03-11 DIAGNOSIS — K5792 Diverticulitis of intestine, part unspecified, without perforation or abscess without bleeding: Secondary | ICD-10-CM | POA: Diagnosis not present

## 2018-03-11 DIAGNOSIS — Z6827 Body mass index (BMI) 27.0-27.9, adult: Secondary | ICD-10-CM | POA: Diagnosis not present

## 2018-03-11 DIAGNOSIS — Z79899 Other long term (current) drug therapy: Secondary | ICD-10-CM | POA: Diagnosis not present

## 2018-03-11 DIAGNOSIS — N179 Acute kidney failure, unspecified: Secondary | ICD-10-CM

## 2018-03-11 DIAGNOSIS — J449 Chronic obstructive pulmonary disease, unspecified: Secondary | ICD-10-CM | POA: Diagnosis present

## 2018-03-11 DIAGNOSIS — E039 Hypothyroidism, unspecified: Secondary | ICD-10-CM | POA: Diagnosis present

## 2018-03-11 DIAGNOSIS — E785 Hyperlipidemia, unspecified: Secondary | ICD-10-CM | POA: Diagnosis present

## 2018-03-11 DIAGNOSIS — A419 Sepsis, unspecified organism: Secondary | ICD-10-CM | POA: Diagnosis present

## 2018-03-11 DIAGNOSIS — E669 Obesity, unspecified: Secondary | ICD-10-CM | POA: Diagnosis present

## 2018-03-11 DIAGNOSIS — E1142 Type 2 diabetes mellitus with diabetic polyneuropathy: Secondary | ICD-10-CM

## 2018-03-11 DIAGNOSIS — E782 Mixed hyperlipidemia: Secondary | ICD-10-CM

## 2018-03-11 DIAGNOSIS — E876 Hypokalemia: Secondary | ICD-10-CM

## 2018-03-11 DIAGNOSIS — K5733 Diverticulitis of large intestine without perforation or abscess with bleeding: Secondary | ICD-10-CM | POA: Diagnosis present

## 2018-03-11 DIAGNOSIS — I739 Peripheral vascular disease, unspecified: Secondary | ICD-10-CM

## 2018-03-11 DIAGNOSIS — I2510 Atherosclerotic heart disease of native coronary artery without angina pectoris: Secondary | ICD-10-CM | POA: Diagnosis present

## 2018-03-11 DIAGNOSIS — I878 Other specified disorders of veins: Secondary | ICD-10-CM | POA: Diagnosis present

## 2018-03-11 DIAGNOSIS — Z8719 Personal history of other diseases of the digestive system: Secondary | ICD-10-CM | POA: Diagnosis not present

## 2018-03-11 DIAGNOSIS — I10 Essential (primary) hypertension: Secondary | ICD-10-CM

## 2018-03-11 DIAGNOSIS — Z7951 Long term (current) use of inhaled steroids: Secondary | ICD-10-CM | POA: Diagnosis not present

## 2018-03-11 DIAGNOSIS — Z9049 Acquired absence of other specified parts of digestive tract: Secondary | ICD-10-CM | POA: Diagnosis not present

## 2018-03-11 DIAGNOSIS — R112 Nausea with vomiting, unspecified: Secondary | ICD-10-CM | POA: Diagnosis present

## 2018-03-11 DIAGNOSIS — E871 Hypo-osmolality and hyponatremia: Secondary | ICD-10-CM | POA: Diagnosis present

## 2018-03-11 DIAGNOSIS — E1151 Type 2 diabetes mellitus with diabetic peripheral angiopathy without gangrene: Secondary | ICD-10-CM | POA: Diagnosis present

## 2018-03-11 DIAGNOSIS — Z794 Long term (current) use of insulin: Secondary | ICD-10-CM

## 2018-03-11 DIAGNOSIS — Z7982 Long term (current) use of aspirin: Secondary | ICD-10-CM | POA: Diagnosis not present

## 2018-03-11 DIAGNOSIS — K921 Melena: Secondary | ICD-10-CM | POA: Diagnosis present

## 2018-03-11 DIAGNOSIS — K210 Gastro-esophageal reflux disease with esophagitis: Secondary | ICD-10-CM | POA: Diagnosis present

## 2018-03-11 LAB — CBC
HCT: 34.1 % — ABNORMAL LOW (ref 36.0–46.0)
Hemoglobin: 11.7 g/dL — ABNORMAL LOW (ref 12.0–15.0)
MCH: 30.3 pg (ref 26.0–34.0)
MCHC: 34.3 g/dL (ref 30.0–36.0)
MCV: 88.3 fL (ref 80.0–100.0)
Platelets: 237 10*3/uL (ref 150–400)
RBC: 3.86 MIL/uL — ABNORMAL LOW (ref 3.87–5.11)
RDW: 13.6 % (ref 11.5–15.5)
WBC: 14.8 10*3/uL — ABNORMAL HIGH (ref 4.0–10.5)
nRBC: 0 % (ref 0.0–0.2)

## 2018-03-11 LAB — BASIC METABOLIC PANEL
Anion gap: 9 (ref 5–15)
BUN: 31 mg/dL — ABNORMAL HIGH (ref 8–23)
CO2: 23 mmol/L (ref 22–32)
Calcium: 7.9 mg/dL — ABNORMAL LOW (ref 8.9–10.3)
Chloride: 100 mmol/L (ref 98–111)
Creatinine, Ser: 1.72 mg/dL — ABNORMAL HIGH (ref 0.44–1.00)
GFR calc non Af Amer: 31 mL/min — ABNORMAL LOW (ref >60)
GFR, EST AFRICAN AMERICAN: 36 mL/min — AB (ref >60)
Glucose, Bld: 201 mg/dL — ABNORMAL HIGH (ref 70–99)
POTASSIUM: 3.3 mmol/L — AB (ref 3.5–5.1)
Sodium: 132 mmol/L — ABNORMAL LOW (ref 135–145)

## 2018-03-11 LAB — GLUCOSE, CAPILLARY
GLUCOSE-CAPILLARY: 249 mg/dL — AB (ref 70–99)
GLUCOSE-CAPILLARY: 144 mg/dL — AB (ref 70–99)
Glucose-Capillary: 144 mg/dL — ABNORMAL HIGH (ref 70–99)
Glucose-Capillary: 171 mg/dL — ABNORMAL HIGH (ref 70–99)
Glucose-Capillary: 172 mg/dL — ABNORMAL HIGH (ref 70–99)
Glucose-Capillary: 191 mg/dL — ABNORMAL HIGH (ref 70–99)

## 2018-03-11 MED ORDER — SIMETHICONE 80 MG PO CHEW
80.0000 mg | CHEWABLE_TABLET | Freq: Once | ORAL | Status: AC
  Administered 2018-03-11: 80 mg via ORAL
  Filled 2018-03-11: qty 1

## 2018-03-11 MED ORDER — POTASSIUM CHLORIDE IN NACL 20-0.9 MEQ/L-% IV SOLN
INTRAVENOUS | Status: DC
  Administered 2018-03-11: 17:00:00 via INTRAVENOUS

## 2018-03-11 MED ORDER — INSULIN ASPART 100 UNIT/ML ~~LOC~~ SOLN
0.0000 [IU] | Freq: Three times a day (TID) | SUBCUTANEOUS | Status: DC
  Administered 2018-03-11: 3 [IU] via SUBCUTANEOUS
  Administered 2018-03-12: 2 [IU] via SUBCUTANEOUS
  Administered 2018-03-12: 3 [IU] via SUBCUTANEOUS
  Administered 2018-03-12: 2 [IU] via SUBCUTANEOUS
  Administered 2018-03-13: 5 [IU] via SUBCUTANEOUS
  Administered 2018-03-13: 2 [IU] via SUBCUTANEOUS

## 2018-03-11 MED ORDER — INSULIN ASPART 100 UNIT/ML ~~LOC~~ SOLN
0.0000 [IU] | Freq: Every day | SUBCUTANEOUS | Status: DC
  Administered 2018-03-12: 2 [IU] via SUBCUTANEOUS

## 2018-03-11 MED ORDER — GABAPENTIN 400 MG PO CAPS
400.0000 mg | ORAL_CAPSULE | Freq: Once | ORAL | Status: AC
  Administered 2018-03-11: 400 mg via ORAL
  Filled 2018-03-11: qty 1

## 2018-03-11 MED ORDER — INSULIN GLARGINE 100 UNIT/ML ~~LOC~~ SOLN
5.0000 [IU] | Freq: Every day | SUBCUTANEOUS | Status: DC
  Administered 2018-03-11 – 2018-03-12 (×2): 5 [IU] via SUBCUTANEOUS
  Filled 2018-03-11 (×3): qty 0.05

## 2018-03-11 MED ORDER — INSULIN GLARGINE 100 UNIT/ML ~~LOC~~ SOLN
5.0000 [IU] | Freq: Every day | SUBCUTANEOUS | Status: DC
  Filled 2018-03-11 (×3): qty 0.05

## 2018-03-11 NOTE — Progress Notes (Signed)
PROGRESS NOTE  Rachel [name] YIF:[id number] DOB: 1950-02-29 DOA: 03/10/2018 PCP: Mikey Kirschner, MD  Brief History:  68 year old female with a history of diabetes mellitus type 2, hypertension, COPD, hypothyroidism, CAD, hyperlipidemia presenting with abdominal pain and loose stools.  The patient underwent a colonoscopy on 02/14/2018.  EGD showed a single distal esophageal erosion and erosive gastropathy.  Colonoscopy showed sigmoid distal and descending colon diverticulosis.  There was also 3 sessile polyps in the descending and ascending colon.  The patient states that she has felt a bit more constipated since her colonoscopy.  She states that she had not had a bowel movement since 03/06/2018 until the day of admission.  She began having lower abdominal pain around 03/08/2018.  She denies any fevers, chills, but began having nausea and vomiting starting 03/10/2018.  Because of abdominal pain and vomiting, the patient presents emergency department for further evaluation.  Upon EMS arrival, the patient was noted to have generalized weakness with systolic blood pressure in the 70s.  Upon presentation, patient was noted to have serum creatinine 2.03 and WBC 21.0.  CT of the abdomen and pelvis showed left colonic diverticulosis with pericolonic edema and inflammation at the distal descending and proximal sigmoid colon.  The patient was started ceftriaxone and metronidazole.  Assessment/Plan: Acute diverticulitis -Continue ceftriaxone and metronidazole -Continue IV fluids -Patient requesting advancement in diet--> advance to full liquids  Hematochezia -Likely related to the patient's acute diverticulitis; possible ischemic colitis -Patient requested GI evaluation -Drop in hemoglobin partly dilution -am CBC  Acute kidney injury -Secondary to volume depletion -Baseline creatinine 0.9-1.0 -Presenting creatinine 2.03 -continue IVF  Sepsis -Patient presented with hypotension and lactic  acid peaked at 4.2 -Secondary to acute diverticulitis -Continue ceftriaxone and metronidazole -Continue IV fluids -Lactic acid has improved  Diabetes mellitus type 2 -Start reduced home dose Lantus -NovoLog sliding scale -Holding metformin and ozempic  Hyperlipidemia -Continue statin  Hypothyroidism -Continue Synthroid  Peripheral arterial disease -Holding aspirin and Plavix in the setting of hematochezia -Holding Lovenox  Essential hypertension -Holding HCTZ secondary to soft blood pressure  COPD -stable on RA -continue Incruse  Hypokalemia -replete  -add K to IVF -check mag  Hyponatremia -due to volume depletion and HCTZ -hold HCTZ  Hyperlipidemia -continue crestor   Disposition Plan:   Home in 1-2 days  Family Communication:   Daughter updated at bedside 2/3  Consultants:  GI  Code Status:  FULL   DVT Prophylaxis:  SCDs   Procedures: As Listed in Progress Note Above  Antibiotics: Ceftriaxone 2/2>>> Flagyl 2/2>>>     Subjective: Pt had 2 bloody BMs  This am.  Denies cp, sob, n/v.  Denies sob ,f/c, headache.  No rashes  Objective: Vitals:   03/10/18 1759 03/10/18 2000 03/11/18 0654 03/11/18 0934  BP: (!) 112/51 119/79 105/61   Pulse: 85 83 88   Resp: 16     Temp: 98.9 F (37.2 C) 98.6 F (37 C) 98.5 F (36.9 C)   TempSrc: Oral Oral Oral   SpO2: 99% 97% 98% 97%  Weight: 69.9 kg     Height: 5\' 3"  (1.6 m)       Intake/Output Summary (Last 24 hours) at 03/11/2018 1417 Last data filed at 03/11/2018 0900 Gross per 24 hour  Intake 715.57 ml  Output -  Net 715.57 ml   Weight change:  Exam:   General:  Pt is alert, follows commands appropriately, not in acute distress  HEENT: No icterus, No thrush, No neck mass, [place]/AT  Cardiovascular: RRR, S1/S2, no rubs, no gallops  Respiratory: CTA bilaterally, no wheezing, no crackles, no rhonchi  Abdomen: Soft/+BS, LLQ tender, non distended, no guarding  Extremities: No edema, No  lymphangitis, No petechiae, No rashes, no synovitis   Data Reviewed: I have personally reviewed following labs and imaging studies Basic Metabolic Panel: Recent Labs  Lab 03/10/18 1256 03/10/18 1556 03/11/18 0246  NA 132*  --  132*  K 2.4*  --  3.3*  CL 90*  --  100  CO2 22  --  23  GLUCOSE 286*  --  201*  BUN 36*  --  31*  CREATININE 2.03*  --  1.72*  CALCIUM 9.2  --  7.9*  MG  --  2.8*  --    Liver Function Tests: Recent Labs  Lab 03/10/18 1256  AST 25  ALT 13  ALKPHOS 120  BILITOT 0.6  PROT 8.4*  ALBUMIN 4.1   No results for input(s): LIPASE, AMYLASE in the last 168 hours. No results for input(s): AMMONIA in the last 168 hours. Coagulation Profile: No results for input(s): INR, PROTIME in the last 168 hours. CBC: Recent Labs  Lab 03/10/18 1256 03/11/18 0246  WBC 21.0* 14.8*  NEUTROABS 18.0*  --   HGB 15.4* 11.7*  HCT 45.7 34.1*  MCV 87.9 88.3  PLT 325 237   Cardiac Enzymes: No results for input(s): CKTOTAL, CKMB, CKMBINDEX, TROPONINI in the last 168 hours. BNP: Invalid input(s): POCBNP CBG: Recent Labs  Lab 03/10/18 1843 03/10/18 2013 03/11/18 0356 03/11/18 0744 03/11/18 1112  GLUCAP 236* 226* 171* 144* 249*   HbA1C: No results for input(s): HGBA1C in the last 72 hours. Urine analysis:    Component Value Date/Time   COLORURINE AMBER (A) 03/10/2018 1655   APPEARANCEUR HAZY (A) 03/10/2018 1655   LABSPEC 1.011 03/10/2018 1655   PHURINE 5.0 03/10/2018 1655   GLUCOSEU NEGATIVE 03/10/2018 1655   HGBUR MODERATE (A) 03/10/2018 1655   BILIRUBINUR NEGATIVE 03/10/2018 1655   KETONESUR NEGATIVE 03/10/2018 1655   PROTEINUR 30 (A) 03/10/2018 1655   NITRITE NEGATIVE 03/10/2018 1655   LEUKOCYTESUR NEGATIVE 03/10/2018 1655   Sepsis Labs: @LABRCNTIP (procalcitonin:4,lacticidven:4) ) Recent Results (from the past 240 hour(s))  Blood Culture (routine x 2)     Status: None (Preliminary result)   Collection Time: 03/10/18 12:56 PM  Result Value Ref  Range Status   Specimen Description   Final    BLOOD RIGHT ANTECUBITAL Blood Culture adequate volume BOTTLES DRAWN AEROBIC AND ANAEROBIC   Special Requests NONE  Final   Culture   Final    NO GROWTH 1 DAY Performed at [facility], [address], [place], [place] [address]    Report Status PENDING  Incomplete  Blood Culture (routine x 2)     Status: None (Preliminary result)   Collection Time: 03/10/18  1:46 PM  Result Value Ref Range Status   Specimen Description RIGHT ANTECUBITAL  Final   Special Requests   Final    BOTTLES DRAWN AEROBIC AND ANAEROBIC Blood Culture adequate volume   Culture   Final    NO GROWTH < 24 HOURS Performed at [facility], [address], [place], [place] [address]    Report Status PENDING  Incomplete     Scheduled Meds: . aspirin EC  81 mg Oral Daily  . clopidogrel  75 mg Oral Daily  . enoxaparin (LOVENOX) injection  30 mg Subcutaneous Q24H  . insulin aspart  0-9 Units Subcutaneous Q4H  . levothyroxine  100 mcg Oral QAC breakfast  . pantoprazole  40 mg Oral Daily  . potassium chloride SA  20 mEq Oral BID  . umeclidinium bromide  1 puff Inhalation Daily   Continuous Infusions: . 0.9 % NaCl with KCl 40 mEq / L 100 mL/hr (03/11/18 0544)  . cefTRIAXone (ROCEPHIN)  IV Stopped (03/10/18 1632)  . metronidazole 500 mg (03/11/18 0718)    Procedures/Studies: Ct Abdomen Pelvis Wo Contrast  Result Date: 03/10/2018 CLINICAL DATA:  Flu like symptoms. Nausea vomiting and diarrhea. EXAM: CT ABDOMEN AND PELVIS WITHOUT CONTRAST TECHNIQUE: Multidetector CT imaging of the abdomen and pelvis was performed following the standard protocol without IV contrast. COMPARISON:  07/23/2015 FINDINGS: Lower chest: Coronary artery calcification is evident. Hepatobiliary: No focal abnormality in the liver on this study without intravenous contrast. Gallbladder surgically absent. No intrahepatic or extrahepatic biliary dilation. Pancreas: Unremarkable. No pancreatic ductal dilatation  or surrounding inflammatory changes. Spleen: Normal in size without focal abnormality. Adrenals/Urinary Tract: No adrenal nodule or mass. stable 11 mm exophytic lesion upper pole right kidney. 8 mm exophytic lesion lower pole left kidney is similar to prior. No hydronephrosis. No evidence for hydroureter. The urinary bladder appears normal for the degree of distention. Stomach/Bowel: Stomach is unremarkable. No gastric wall thickening. No evidence of outlet obstruction. Duodenum is normally positioned as is the ligament of Treitz. No small bowel wall thickening. No small bowel dilatation. The terminal ileum is normal. The appendix is normal. Diverticular changes are noted in the left colon however there is some pericolonic edema/inflammation associated with the distal descending and proximal sigmoid segments. Vascular/Lymphatic: There is abdominal aortic atherosclerosis without aneurysm. Bilateral common iliac artery stents evident. There is no gastrohepatic or hepatoduodenal ligament lymphadenopathy. No intraperitoneal or retroperitoneal lymphadenopathy. No pelvic sidewall lymphadenopathy. Reproductive: The uterus is unremarkable.  There is no adnexal mass. Other: No intraperitoneal free fluid. Musculoskeletal: No worrisome lytic or sclerotic osseous abnormality. IMPRESSION: 1. Left colonic diverticulosis with pericolonic edema/inflammation around the distal descending and proximal sigmoid segments. Imaging features are compatible with diverticulitis. No perforation or abscess. 2. Stable appearance bilateral renal lesions, likely cysts. 3. Aortic Atherosclerois (ICD10-170.0) Electronically Signed   By: Misty Stanley M.D.   On: 03/10/2018 15:26    Orson Eva, DO  [facility] Pager [phone]  If 7PM-7AM, please contact night-coverage [email or web address] Password [email or web address] 03/11/2018, 2:17 PM   LOS: 0 days

## 2018-03-12 ENCOUNTER — Encounter (HOSPITAL_COMMUNITY): Payer: Self-pay | Admitting: Gastroenterology

## 2018-03-12 DIAGNOSIS — I2510 Atherosclerotic heart disease of native coronary artery without angina pectoris: Secondary | ICD-10-CM

## 2018-03-12 LAB — CBC
HCT: 30.8 % — ABNORMAL LOW (ref 36.0–46.0)
Hemoglobin: 10.4 g/dL — ABNORMAL LOW (ref 12.0–15.0)
MCH: 30.1 pg (ref 26.0–34.0)
MCHC: 33.8 g/dL (ref 30.0–36.0)
MCV: 89 fL (ref 80.0–100.0)
PLATELETS: 196 10*3/uL (ref 150–400)
RBC: 3.46 MIL/uL — AB (ref 3.87–5.11)
RDW: 13.5 % (ref 11.5–15.5)
WBC: 9.4 10*3/uL (ref 4.0–10.5)
nRBC: 0 % (ref 0.0–0.2)

## 2018-03-12 LAB — MAGNESIUM: Magnesium: 2.4 mg/dL (ref 1.7–2.4)

## 2018-03-12 LAB — BASIC METABOLIC PANEL
ANION GAP: 7 (ref 5–15)
BUN: 21 mg/dL (ref 8–23)
CALCIUM: 8.2 mg/dL — AB (ref 8.9–10.3)
CO2: 21 mmol/L — ABNORMAL LOW (ref 22–32)
CREATININE: 1.79 mg/dL — AB (ref 0.44–1.00)
Chloride: 109 mmol/L (ref 98–111)
GFR calc non Af Amer: 29 mL/min — ABNORMAL LOW (ref >60)
GFR, EST AFRICAN AMERICAN: 34 mL/min — AB (ref >60)
Glucose, Bld: 135 mg/dL — ABNORMAL HIGH (ref 70–99)
Potassium: 3 mmol/L — ABNORMAL LOW (ref 3.5–5.1)
Sodium: 137 mmol/L (ref 135–145)

## 2018-03-12 LAB — GLUCOSE, CAPILLARY
Glucose-Capillary: 139 mg/dL — ABNORMAL HIGH (ref 70–99)
Glucose-Capillary: 139 mg/dL — ABNORMAL HIGH (ref 70–99)
Glucose-Capillary: 141 mg/dL — ABNORMAL HIGH (ref 70–99)
Glucose-Capillary: 167 mg/dL — ABNORMAL HIGH (ref 70–99)
Glucose-Capillary: 234 mg/dL — ABNORMAL HIGH (ref 70–99)

## 2018-03-12 MED ORDER — ROSUVASTATIN CALCIUM 20 MG PO TABS
40.0000 mg | ORAL_TABLET | Freq: Every day | ORAL | Status: DC
  Administered 2018-03-12: 40 mg via ORAL
  Filled 2018-03-12: qty 2

## 2018-03-12 NOTE — Plan of Care (Signed)

## 2018-03-12 NOTE — Consult Note (Signed)
Referring Provider: Dr. Carles Collet  Primary Care Physician:  Mikey Kirschner, MD Primary Gastroenterologist:  Dr. Gala Romney   Date of Admission: 03/10/2018 Date of Consultation: 03/12/2018  Reason for Consultation:  Hematochezia   HPI:  VIVION ROMANO is a 68 y.o. year old female with known history of IDA, s/p recent EGD and colonoscopy Feb 14, 2018. Findings of diverticulosis in sigmoid and descending colon, three 4-6 mm polyps in descending colon and ascending. EGD with mild erosive reflux esophagitis, s/p dilation. Erosive gastropathy s/p biopsy. Chronic inactive gastritis, negative H.pylori. Tubular adenoma.   Noted severe abdominal pain Sunday lower abdomen, felt constipated, couldn't have a BM. Sat for awhile and then started having  diarrhea. Onset of rectal bleeding Sunday evening through this morning bright red blood per rectum but lightening up. Small amount of blood now. No fever or chills but was "hot". No pain. Had gas pains while here. Has been on clear liquids. Gas pill last night. No nausea or vomiting. Sunday was vomiting. Feels improved since admission.   Ct abd/pelvis without contrast showed left colonic diverticulosis with pericolonic edema/inflammation around distal descending and proximal sigmoid segments, compatible with diverticulitis. Leukocytosis on admission now resolved. She was found by EMS to be hypotensive with SBP in the 60s.   Past Medical History:  Diagnosis Date  . Asthma   . Coronary atherosclerosis of native coronary artery    DES x 2 (overlapping) RCA 03/2008 - patent 2011  . Diabetes type 2, controlled ([place]) 2000  . Essential hypertension, benign   . Fatty liver   . GERD (gastroesophageal reflux disease)   . Hyperlipidemia   . Microalbuminuria   . Obesity   . Odynophagia    Severe esophagitis on CT 05/2010, treated empirically with Diflucan  . PAD (peripheral artery disease) (HCC)    history of left greater than right lower 70 claudication  . Tubular  adenoma of colon 07/2007   Multiple colonic polyps  . Venous stasis     Past Surgical History:  Procedure Laterality Date  . BIOPSY  02/14/2018   Procedure: BIOPSY;  Surgeon: Daneil Dolin, MD;  [facility];  Service: Endoscopy;;  gastric bx's  . CARDIAC CATHETERIZATION     stents  . CHOLECYSTECTOMY N/A 07/30/2015   Procedure: LAPAROSCOPIC CHOLECYSTECTOMY;  Surgeon: Aviva Signs, MD;  Location: [facility];  Service: General;  Laterality: N/A;  . COLONOSCOPY W/ POLYPECTOMY  07/2007   Tubular adenoma  . COLONOSCOPY WITH PROPOFOL N/A 02/14/2018   Dr. Gala Romney: diverticulosis in sigmoid and descending colon, three 4-6 mm polyps in descending colon and ascending, tubular adenomas  . CORONARY STENT PLACEMENT    . CYSTECTOMY     Pilonidal  . ESOPHAGOGASTRODUODENOSCOPY  04/2010   RMR: Normal esophagus , Stomach D1 and D2   . ESOPHAGOGASTRODUODENOSCOPY (EGD) WITH PROPOFOL N/A 02/14/2018   Dr. Gala Romney: mild erosive reflux esophagitis, s/p dilatation, erosive gastropathy s/p biopsy, chronic inactive gastritis, negative H.pylori  . LOWER EXTREMITY ANGIOGRAPHY N/A 03/13/2016   Procedure: Lower Extremity Angiography;  Surgeon: [name], MD;  Location: [place] [facility];  Service: Cardiovascular;  Laterality: N/A;  . Venia Minks DILATION N/A 02/14/2018   Procedure: Venia Minks DILATION;  Surgeon: Daneil Dolin, MD;  [facility]: [facility];  Service: Endoscopy;  Laterality: N/A;  . PERIPHERAL VASCULAR ATHERECTOMY  03/27/2016   Procedure: Peripheral Vascular Atherectomy;  Surgeon: [name], MD;  Location: [place] [facility];  Service: Cardiovascular;;  Attempted atherectomy  . PERIPHERAL  VASCULAR CATHETERIZATION Bilateral 09/28/2014   Procedure: Lower Extremity Angiography;  Surgeon: [name], MD;  Location: [place] [facility];  Service: Cardiovascular;  Laterality: Bilateral;  . PERIPHERAL VASCULAR CATHETERIZATION N/A 09/28/2014   Procedure: Abdominal Aortogram;  Surgeon: [name],  MD;  Location: [place] [facility];  Service: Cardiovascular;  Laterality: N/A;  . PERIPHERAL VASCULAR CATHETERIZATION N/A 10/15/2014   Procedure: Lower Extremity Angiography;  Surgeon: [name], MD;  Location: [place] [facility];  Service: Cardiovascular;  Laterality: N/A;  . PERIPHERAL VASCULAR CATHETERIZATION  10/15/2014   Procedure: Peripheral Vascular Intervention;  Surgeon: [name], MD;  Location: [place] [facility];  Service: Cardiovascular;;  bilateral iliac stents  . PILONIDAL CYST / SINUS EXCISION    . POLYPECTOMY  02/14/2018   Procedure: POLYPECTOMY;  Surgeon: Daneil Dolin, MD;  [facility];  Service: Endoscopy;;  ascending colon polyps x2, descending polyps x3  . TUBAL LIGATION Bilateral     Prior to Admission medications   Medication Sig Start Date End Date Taking? Authorizing Provider  albuterol (PROAIR HFA) 108 (90 Base) MCG/ACT inhaler Inhale 2 puffs into the lungs every 4 (four) hours as needed for wheezing or shortness of breath. 05/10/17  Yes Collene Gobble, MD  aspirin EC 81 MG tablet Take 81 mg by mouth daily.     Yes [provider]  clopidogrel (PLAVIX) 75 MG tablet Take 1 tablet (75 mg total) by mouth daily. 12/20/17  Yes [name], MD  fluticasone (FLONASE) 50 MCG/ACT nasal spray Place 2 sprays into both nostrils 2 (two) times daily. Patient taking differently: Place 2 sprays into both nostrils daily.  12/21/17  Yes Collene Gobble, MD  gabapentin (NEURONTIN) 400 MG capsule TAKE ONE CAPSULE BY MOUTH AT BEDTIME AS DIRECTED Patient taking differently: Take 400 mg by mouth at bedtime.  12/11/17  Yes Mikey Kirschner, MD  hydrochlorothiazide (MICROZIDE) 12.5 MG capsule TAKE 1 CAPSULE BY MOUTH EVERY DAY 01/29/18  Yes Mikey Kirschner, MD  insulin aspart (NOVOLOG FLEXPEN) 100 UNIT/ML FlexPen Inject 7 Units into the skin 3 (three) times daily with meals. 03/07/18  Yes Shamleffer, Melanie Crazier, MD  Insulin Glargine (LANTUS SOLOSTAR)  100 UNIT/ML Solostar Pen Inject 28 Units into the skin daily. 02/04/18  Yes Shamleffer, Melanie Crazier, MD  levothyroxine (SYNTHROID, LEVOTHROID) 100 MCG tablet TAKE 1 TABLET BY MOUTH EVERY DAY BEFORE BREAKFAST Patient taking differently: Take 100 mcg by mouth daily before breakfast.  09/19/17  Yes Nida, Marella Chimes, MD  meclizine (ANTIVERT) 25 MG tablet Take 1 tablet (25 mg total) by mouth 3 (three) times daily as needed for dizziness. 05/23/17  Yes Mikey Kirschner, MD  metFORMIN (GLUCOPHAGE XR) 500 MG 24 hr tablet Take 1 tablet (500 mg total) by mouth 2 (two) times daily with a meal. 02/04/18  Yes Shamleffer, Melanie Crazier, MD  pantoprazole (PROTONIX) 40 MG tablet  02/21/18  Yes [provider]  potassium chloride SA (K-DUR,KLOR-CON) 20 MEQ tablet Take 1 tablet (20 mEq total) by mouth 2 (two) times daily. 11/06/17  Yes Daleen Bo, MD  rosuvastatin (CRESTOR) 40 MG tablet TAKE 1 TABLET EVERY DAY AT BEDTIME Patient taking differently: Take 40 mg by mouth at bedtime.  12/20/17  Yes [name], MD  Semaglutide,0.25 or 0.5MG /DOS, (OZEMPIC, 0.25 OR 0.5 MG/DOSE,) 2 MG/1.5ML SOPN Inject 0.25 mg into the skin once a week. 02/04/18  Yes Shamleffer, Melanie Crazier, MD  tiotropium (SPIRIVA) 18 MCG inhalation  capsule Place 1 capsule (18 mcg total) into inhaler and inhale daily. 01/24/18  Yes Collene Gobble, MD    Current Facility-Administered Medications  Medication Dose Route Frequency Provider Last Rate Last Dose  . 0.9 % NaCl with KCl 20 mEq/ L  infusion   Intravenous Continuous Tat, David, MD 50 mL/hr at 03/12/18 0500    . acetaminophen (TYLENOL) tablet 650 mg  650 mg Oral Q6H PRN Emokpae, Ejiroghene E, MD       Or  . acetaminophen (TYLENOL) suppository 650 mg  650 mg Rectal Q6H PRN Emokpae, Ejiroghene E, MD      . cefTRIAXone (ROCEPHIN) 2 g in sodium chloride 0.9 % 100 mL IVPB  2 g Intravenous Q24H Emokpae, Ejiroghene E, MD 200 mL/hr at 03/11/18 1504 2 g at 03/11/18 1504  .  enoxaparin (LOVENOX) injection 30 mg  30 mg Subcutaneous Q24H Emokpae, Ejiroghene E, MD   30 mg at 03/11/18 1657  . insulin aspart (novoLOG) injection 0-15 Units  0-15 Units Subcutaneous TID WC Orson Eva, MD   2 Units at 03/12/18 1246  . insulin aspart (novoLOG) injection 0-5 Units  0-5 Units Subcutaneous QHS Tat, David, MD      . insulin glargine (LANTUS) injection 5 Units  5 Units Subcutaneous Daily Tat, David, MD   5 Units at 03/11/18 2051  . levothyroxine (SYNTHROID, LEVOTHROID) tablet 100 mcg  100 mcg Oral QAC breakfast Emokpae, Ejiroghene E, MD   100 mcg at 03/12/18 0616  . metroNIDAZOLE (FLAGYL) IVPB 500 mg  500 mg Intravenous Q8H Emokpae, Ejiroghene E, MD 100 mL/hr at 03/12/18 0619 500 mg at 03/12/18 0619  . morphine 2 MG/ML injection 2 mg  2 mg Intravenous Q6H PRN Emokpae, Ejiroghene E, MD      . pantoprazole (PROTONIX) EC tablet 40 mg  40 mg Oral Daily Emokpae, Ejiroghene E, MD   40 mg at 03/12/18 0907  . promethazine (PHENERGAN) injection 12.5 mg  12.5 mg Intravenous Q8H PRN Emokpae, Ejiroghene E, MD      . umeclidinium bromide (INCRUSE ELLIPTA) 62.5 MCG/INH 1 puff  1 puff Inhalation Daily Emokpae, Ejiroghene E, MD        Allergies as of 03/10/2018 - Review Complete 03/10/2018  Allergen Reaction Noted  . Altace [ramipril] Cough 06/07/2012  . Biaxin [clarithromycin] Other (See Comments) 06/07/2012  . Niacin and related Other (See Comments) 06/07/2012  . Nsaids Other (See Comments) 10/09/2014  . Tramadol Other (See Comments) 11/06/2017  . Adhesive [tape] Rash and Other (See Comments) 03/22/2016    Family History  Problem Relation Age of Onset  . Bone cancer Father   . Heart disease Maternal Grandmother   . Hypertension Mother   . Diabetes Mother   . Colon cancer Neg Hx   . Liver disease Neg Hx   . Inflammatory bowel disease Neg Hx     Social History   Socioeconomic History  . Marital status: Divorced    Spouse name: Not on file  . Number of children: 1  . Years of  education: Not on file  . Highest education level: Not on file  Occupational History  . Occupation: retired  Scientific laboratory technician  . Financial resource strain: Not on file  . Food insecurity:    Worry: Not on file    Inability: Not on file  . Transportation needs:    Medical: Not on file    Non-medical: Not on file  Tobacco Use  . Smoking status: Current Every Day Smoker  Packs/day: 0.25    Years: 50.00    Pack years: 12.50    Types: Cigarettes  . Smokeless tobacco: Never Used  Substance and Sexual Activity  . Alcohol use: No  . Drug use: No  . Sexual activity: Yes    Partners: Male    Birth control/protection: Condom    Comment: mutual friends  Lifestyle  . Physical activity:    Days per week: Not on file    Minutes per session: Not on file  . Stress: Not on file  Relationships  . Social connections:    Talks on phone: Not on file    Gets together: Not on file    Attends religious service: Not on file    Active member of club or organization: Not on file    Attends meetings of clubs or organizations: Not on file    Relationship status: Not on file  . Intimate partner violence:    Fear of current or ex partner: Not on file    Emotionally abused: Not on file    Physically abused: Not on file    Forced sexual activity: Not on file  Other Topics Concern  . Not on file  Social History Narrative  . Not on file    Review of Systems: Gen: Denies fever, chills, loss of appetite, change in weight or weight loss CV: Denies chest pain, heart palpitations, syncope, edema  Resp: Denies shortness of breath with rest, cough, wheezing GI: see HPI GU : Denies urinary burning, urinary frequency, urinary incontinence.  MS: Denies joint pain,swelling, cramping Derm: Denies rash, itching, dry skin Psych: Denies depression, anxiety,confusion, or memory loss Heme: see HPI  Physical Exam: Vital signs in last 24 hours: Temp:  [98.3 F (36.8 C)-98.7 F (37.1 C)] 98.3 F (36.8 C)  (02/04 0539) Pulse Rate:  [74-75] 75 (02/04 0539) Resp:  [18] 18 (02/04 0539) BP: (93-99)/(45-59) 93/45 (02/04 0539) SpO2:  [84 %-98 %] 98 % (02/04 0539) Last BM Date: 03/11/18 General:   Alert,  Well-developed, well-nourished, pleasant and cooperative in NAD Head:  Normocephalic and atraumatic. Eyes:  Sclera clear, no icterus.   Conjunctiva pink. Ears:  Normal auditory acuity. Nose:  No deformity, discharge,  or lesions. Mouth:  No deformity or lesions, dentition normal. Lungs:  Clear throughout to auscultation.   No wheezes, crackles, or rhonchi. No acute distress. Heart:  Regular rate and rhythm; no murmurs, clicks, rubs,  or gallops. Abdomen:  Soft, mild TTP lower abdomen and nondistended. No masses, hepatosplenomegaly or hernias noted. Normal bowel sounds, without guarding, and without rebound.   Rectal:  Deferred  Msk:  Symmetrical without gross deformities. Normal posture. Extremities:  Without  edema. Neurologic:  Alert and  oriented x4 Psych:  Alert and cooperative. Normal mood and affect.  Intake/Output from previous day: 02/03 0701 - 02/04 0700 In: 2601.4 [P.O.:600; I.V.:1387.8; IV Piggyback:613.7] Out: -  Intake/Output this shift: Total I/O In: 120 [P.O.:120] Out: -   Lab Results: Recent Labs    03/10/18 1256 03/11/18 0246 03/12/18 0531  WBC 21.0* 14.8* 9.4  HGB 15.4* 11.7* 10.4*  HCT 45.7 34.1* 30.8*  PLT 325 237 196   BMET Recent Labs    03/10/18 1256 03/11/18 0246 03/12/18 0531  NA 132* 132* 137  K 2.4* 3.3* 3.0*  CL 90* 100 109  CO2 22 23 21*  GLUCOSE 286* 201* 135*  BUN 36* 31* 21  CREATININE 2.03* 1.72* 1.79*  CALCIUM 9.2 7.9* 8.2*   LFT Recent Labs  03/10/18 1256  PROT 8.4*  ALBUMIN 4.1  AST 25  ALT 13  ALKPHOS 120  BILITOT 0.6    Studies/Results: Ct Abdomen Pelvis Wo Contrast  Result Date: 03/10/2018 CLINICAL DATA:  Flu like symptoms. Nausea vomiting and diarrhea. EXAM: CT ABDOMEN AND PELVIS WITHOUT CONTRAST TECHNIQUE:  Multidetector CT imaging of the abdomen and pelvis was performed following the standard protocol without IV contrast. COMPARISON:  07/23/2015 FINDINGS: Lower chest: Coronary artery calcification is evident. Hepatobiliary: No focal abnormality in the liver on this study without intravenous contrast. Gallbladder surgically absent. No intrahepatic or extrahepatic biliary dilation. Pancreas: Unremarkable. No pancreatic ductal dilatation or surrounding inflammatory changes. Spleen: Normal in size without focal abnormality. Adrenals/Urinary Tract: No adrenal nodule or mass. stable 11 mm exophytic lesion upper pole right kidney. 8 mm exophytic lesion lower pole left kidney is similar to prior. No hydronephrosis. No evidence for hydroureter. The urinary bladder appears normal for the degree of distention. Stomach/Bowel: Stomach is unremarkable. No gastric wall thickening. No evidence of outlet obstruction. Duodenum is normally positioned as is the ligament of Treitz. No small bowel wall thickening. No small bowel dilatation. The terminal ileum is normal. The appendix is normal. Diverticular changes are noted in the left colon however there is some pericolonic edema/inflammation associated with the distal descending and proximal sigmoid segments. Vascular/Lymphatic: There is abdominal aortic atherosclerosis without aneurysm. Bilateral common iliac artery stents evident. There is no gastrohepatic or hepatoduodenal ligament lymphadenopathy. No intraperitoneal or retroperitoneal lymphadenopathy. No pelvic sidewall lymphadenopathy. Reproductive: The uterus is unremarkable.  There is no adnexal mass. Other: No intraperitoneal free fluid. Musculoskeletal: No worrisome lytic or sclerotic osseous abnormality. IMPRESSION: 1. Left colonic diverticulosis with pericolonic edema/inflammation around the distal descending and proximal sigmoid segments. Imaging features are compatible with diverticulitis. No perforation or abscess. 2.  Stable appearance bilateral renal lesions, likely cysts. 3. Aortic Atherosclerois (ICD10-170.0) Electronically Signed   By: Misty Stanley M.D.   On: 03/10/2018 15:26    Impression: 68 year old female presenting with acute abdominal pain, loose stools, and subsequent rectal bleeding after admission with CT findings (without contrast) of likely diverticulitis. Started on Rocephin and Flagyl on admission. Notably hypotensive when EMS assessed her with SBP in the 60s. Clinically, she has improved and now tolerating clear liquids. Abdominal pain resolved, and rectal bleeding has tapered. Unable to exclude a component of ischemic etiology in this setting. Overall, she has had improvement since admission with supportive measures and antibiotic therapy.   Would continue with full liquids and advance as tolerated to soft diet. Continue IV antibiotics for now and transition to oral once tolerating diet.   Plan: Continue antibiotics Advance as tolerated to soft diet: orders placed Transition to oral antibiotics when tolerating diet Continue PPI therapy Hopeful discharge in next 24-48 hours   Annitta Needs, PhD, ANP-BC [facility]      LOS: 1 day    03/12/2018, 1:26 PM

## 2018-03-12 NOTE — Progress Notes (Signed)
PROGRESS NOTE  Rachel [name] URK:[id number] DOB: 06-27-1950 DOA: 03/10/2018 PCP: Mikey Kirschner, MD  Brief History:  68 year old female with a history of diabetes mellitus type 2, hypertension, COPD, hypothyroidism, CAD, hyperlipidemia presenting with abdominal pain and loose stools.  The patient underwent a colonoscopy on 02/14/2018.  EGD showed a single distal esophageal erosion and erosive gastropathy.  Colonoscopy showed sigmoid distal and descending colon diverticulosis.  There was also 3 sessile polyps in the descending and ascending colon.  The patient states that she has felt a bit more constipated since her colonoscopy.  She states that she had not had a bowel movement since 03/06/2018 until the day of admission.  She began having lower abdominal pain around 03/08/2018.  She denies any fevers, chills, but began having nausea and vomiting starting 03/10/2018.  Because of abdominal pain and vomiting, the patient presents emergency department for further evaluation.  Upon EMS arrival, the patient was noted to have generalized weakness with systolic blood pressure in the 70s.  Upon presentation, patient was noted to have serum creatinine 2.03 and WBC 21.0.  CT of the abdomen and pelvis showed left colonic diverticulosis with pericolonic edema and inflammation at the distal descending and proximal sigmoid colon.  The patient was started ceftriaxone and metronidazole.  Assessment/Plan: Acute diverticulitis -Continue ceftriaxone and metronidazole -Continue IV fluids -Patient requesting advancement in diet--> advance to dys 3  Hematochezia -Likely related to the patient's acute diverticulitis; possible ischemic colitis -Appreciate GI consult-->advance diet -Drop in hemoglobin partly dilution -am CBC  Acute kidney injury -Secondary to volume depletion and hypotension -Baseline creatinine 0.9-1.0 -Presenting creatinine 2.03 -continue IVF  Sepsis -Patient presented with  hypotension and lactic acid peaked at 4.2 -Secondary to acute diverticulitis -Continue ceftriaxone and metronidazole -Continue IV fluids -Lactic acid has improved  Diabetes mellitus type 2 -Start reduced home dose Lantus -NovoLog sliding scale -Holding metformin and ozempic  Hyperlipidemia -Continue statin  Hypothyroidism -Continue Synthroid  Peripheral arterial disease -Holding aspirin and Plavix in the setting of hematochezia -Holding Lovenox  Essential hypertension -Holding HCTZ secondary to soft blood pressure  COPD -stable on RA -continue Incruse  Hypokalemia -replete  -add K to IVF -check mag  Hyponatremia -due to volume depletion and HCTZ -hold HCTZ  Hyperlipidemia -continue crestor   Disposition Plan:   Home in 1-2 days  Family Communication:   Daughter updated at bedside 2/3  Consultants:  GI  Code Status:  FULL   DVT Prophylaxis:  SCDs   Procedures: As Listed in Progress Note Above  Antibiotics: Ceftriaxone 2/2>>> Flagyl 2/2>>>  Subjective: Pt only had small amount hematochezia today.  abd pain is slowly improving.  No n/v.  No cp, sob, f/c.  No dysuria or hematuria  Objective: Vitals:   03/11/18 0654 03/11/18 0934 03/11/18 2111 03/12/18 0539  BP: 105/61  (!) 99/59 (!) 93/45  Pulse: 88  74 75  Resp:   18 18  Temp: 98.5 F (36.9 C)  98.7 F (37.1 C) 98.3 F (36.8 C)  TempSrc: Oral  Oral Oral  SpO2: 98% 97% (!) 84% 98%  Weight:      Height:        Intake/Output Summary (Last 24 hours) at 03/12/2018 1557 Last data filed at 03/12/2018 0900 Gross per 24 hour  Intake 2481.41 ml  Output -  Net 2481.41 ml   Weight change:  Exam:   General:  Pt is alert, follows commands appropriately, not in acute  distress  HEENT: No icterus, No thrush, No neck mass, [place]/AT  Cardiovascular: RRR, S1/S2, no rubs, no gallops  Respiratory: CTA bilaterally, no wheezing, no crackles, no rhonchi  Abdomen: Soft/+BS, LLQ, RLQ tender,  non distended, no guarding  Extremities: No edema, No lymphangitis, No petechiae, No rashes, no synovitis   Data Reviewed: I have personally reviewed following labs and imaging studies Basic Metabolic Panel: Recent Labs  Lab 03/10/18 1256 03/10/18 1556 03/11/18 0246 03/12/18 0531  NA 132*  --  132* 137  K 2.4*  --  3.3* 3.0*  CL 90*  --  100 109  CO2 22  --  23 21*  GLUCOSE 286*  --  201* 135*  BUN 36*  --  31* 21  CREATININE 2.03*  --  1.72* 1.79*  CALCIUM 9.2  --  7.9* 8.2*  MG  --  2.8*  --  2.4   Liver Function Tests: Recent Labs  Lab 03/10/18 1256  AST 25  ALT 13  ALKPHOS 120  BILITOT 0.6  PROT 8.4*  ALBUMIN 4.1   No results for input(s): LIPASE, AMYLASE in the last 168 hours. No results for input(s): AMMONIA in the last 168 hours. Coagulation Profile: No results for input(s): INR, PROTIME in the last 168 hours. CBC: Recent Labs  Lab 03/10/18 1256 03/11/18 0246 03/12/18 0531  WBC 21.0* 14.8* 9.4  NEUTROABS 18.0*  --   --   HGB 15.4* 11.7* 10.4*  HCT 45.7 34.1* 30.8*  MCV 87.9 88.3 89.0  PLT 325 237 196   Cardiac Enzymes: No results for input(s): CKTOTAL, CKMB, CKMBINDEX, TROPONINI in the last 168 hours. BNP: Invalid input(s): POCBNP CBG: Recent Labs  Lab 03/11/18 2032 03/11/18 2357 03/12/18 0413 03/12/18 0726 03/12/18 1217  GLUCAP 144* 191* 139* 139* 141*   HbA1C: No results for input(s): HGBA1C in the last 72 hours. Urine analysis:    Component Value Date/Time   COLORURINE AMBER (A) 03/10/2018 1655   APPEARANCEUR HAZY (A) 03/10/2018 1655   LABSPEC 1.011 03/10/2018 1655   PHURINE 5.0 03/10/2018 1655   GLUCOSEU NEGATIVE 03/10/2018 1655   HGBUR MODERATE (A) 03/10/2018 1655   BILIRUBINUR NEGATIVE 03/10/2018 1655   KETONESUR NEGATIVE 03/10/2018 1655   PROTEINUR 30 (A) 03/10/2018 1655   NITRITE NEGATIVE 03/10/2018 1655   LEUKOCYTESUR NEGATIVE 03/10/2018 1655   Sepsis Labs: @LABRCNTIP (procalcitonin:4,lacticidven:4) ) Recent Results  (from the past 240 hour(s))  Blood Culture (routine x 2)     Status: None (Preliminary result)   Collection Time: 03/10/18 12:56 PM  Result Value Ref Range Status   Specimen Description   Final    BLOOD RIGHT ANTECUBITAL Blood Culture adequate volume BOTTLES DRAWN AEROBIC AND ANAEROBIC   Special Requests NONE  Final   Culture   Final    NO GROWTH 2 DAYS Performed at [facility], [address] [place], [place] [address]    Report Status PENDING  Incomplete  Blood Culture (routine x 2)     Status: None (Preliminary result)   Collection Time: 03/10/18  1:46 PM  Result Value Ref Range Status   Specimen Description RIGHT ANTECUBITAL  Final   Special Requests   Final    BOTTLES DRAWN AEROBIC AND ANAEROBIC Blood Culture adequate volume   Culture   Final    NO GROWTH 2 DAYS Performed at [facility], [address] [place], [place] [address]    Report Status PENDING  Incomplete     Scheduled Meds: . enoxaparin (LOVENOX) injection  30 mg  Subcutaneous Q24H  . insulin aspart  0-15 Units Subcutaneous TID WC  . insulin aspart  0-5 Units Subcutaneous QHS  . insulin glargine  5 Units Subcutaneous Daily  . levothyroxine  100 mcg Oral QAC breakfast  . pantoprazole  40 mg Oral Daily  . umeclidinium bromide  1 puff Inhalation Daily   Continuous Infusions: . 0.9 % NaCl with KCl 20 mEq / L 50 mL/hr at 03/12/18 0500  . cefTRIAXone (ROCEPHIN)  IV 2 g (03/12/18 1525)  . metronidazole 500 mg (03/12/18 6160)    Procedures/Studies: Ct Abdomen Pelvis Wo Contrast  Result Date: 03/10/2018 CLINICAL DATA:  Flu like symptoms. Nausea vomiting and diarrhea. EXAM: CT ABDOMEN AND PELVIS WITHOUT CONTRAST TECHNIQUE: Multidetector CT imaging of the abdomen and pelvis was performed following the standard protocol without IV contrast. COMPARISON:  07/23/2015 FINDINGS: Lower chest: Coronary artery calcification is evident. Hepatobiliary: No focal abnormality in the liver on this study without intravenous contrast.  Gallbladder surgically absent. No intrahepatic or extrahepatic biliary dilation. Pancreas: Unremarkable. No pancreatic ductal dilatation or surrounding inflammatory changes. Spleen: Normal in size without focal abnormality. Adrenals/Urinary Tract: No adrenal nodule or mass. stable 11 mm exophytic lesion upper pole right kidney. 8 mm exophytic lesion lower pole left kidney is similar to prior. No hydronephrosis. No evidence for hydroureter. The urinary bladder appears normal for the degree of distention. Stomach/Bowel: Stomach is unremarkable. No gastric wall thickening. No evidence of outlet obstruction. Duodenum is normally positioned as is the ligament of Treitz. No small bowel wall thickening. No small bowel dilatation. The terminal ileum is normal. The appendix is normal. Diverticular changes are noted in the left colon however there is some pericolonic edema/inflammation associated with the distal descending and proximal sigmoid segments. Vascular/Lymphatic: There is abdominal aortic atherosclerosis without aneurysm. Bilateral common iliac artery stents evident. There is no gastrohepatic or hepatoduodenal ligament lymphadenopathy. No intraperitoneal or retroperitoneal lymphadenopathy. No pelvic sidewall lymphadenopathy. Reproductive: The uterus is unremarkable.  There is no adnexal mass. Other: No intraperitoneal free fluid. Musculoskeletal: No worrisome lytic or sclerotic osseous abnormality. IMPRESSION: 1. Left colonic diverticulosis with pericolonic edema/inflammation around the distal descending and proximal sigmoid segments. Imaging features are compatible with diverticulitis. No perforation or abscess. 2. Stable appearance bilateral renal lesions, likely cysts. 3. Aortic Atherosclerois (ICD10-170.0) Electronically Signed   By: Misty Stanley M.D.   On: 03/10/2018 15:26    Orson Eva, DO  [facility] Pager [phone]  If 7PM-7AM, please contact night-coverage [email or web address] Password  [email or web address] 03/12/2018, 3:57 PM   LOS: 1 day

## 2018-03-13 LAB — BASIC METABOLIC PANEL
Anion gap: 8 (ref 5–15)
BUN: 14 mg/dL (ref 8–23)
CO2: 21 mmol/L — ABNORMAL LOW (ref 22–32)
Calcium: 8.5 mg/dL — ABNORMAL LOW (ref 8.9–10.3)
Chloride: 108 mmol/L (ref 98–111)
Creatinine, Ser: 1.42 mg/dL — ABNORMAL HIGH (ref 0.44–1.00)
GFR calc Af Amer: 45 mL/min — ABNORMAL LOW (ref >60)
GFR calc non Af Amer: 39 mL/min — ABNORMAL LOW (ref >60)
Glucose, Bld: 152 mg/dL — ABNORMAL HIGH (ref 70–99)
Potassium: 3.2 mmol/L — ABNORMAL LOW (ref 3.5–5.1)
Sodium: 137 mmol/L (ref 135–145)

## 2018-03-13 LAB — CBC
HCT: 32.9 % — ABNORMAL LOW (ref 36.0–46.0)
Hemoglobin: 11 g/dL — ABNORMAL LOW (ref 12.0–15.0)
MCH: 30.4 pg (ref 26.0–34.0)
MCHC: 33.4 g/dL (ref 30.0–36.0)
MCV: 90.9 fL (ref 80.0–100.0)
Platelets: 208 10*3/uL (ref 150–400)
RBC: 3.62 MIL/uL — ABNORMAL LOW (ref 3.87–5.11)
RDW: 13.4 % (ref 11.5–15.5)
WBC: 6 10*3/uL (ref 4.0–10.5)
nRBC: 0 % (ref 0.0–0.2)

## 2018-03-13 LAB — GLUCOSE, CAPILLARY
Glucose-Capillary: 158 mg/dL — ABNORMAL HIGH (ref 70–99)
Glucose-Capillary: 159 mg/dL — ABNORMAL HIGH (ref 70–99)
Glucose-Capillary: 203 mg/dL — ABNORMAL HIGH (ref 70–99)
Glucose-Capillary: 206 mg/dL — ABNORMAL HIGH (ref 70–99)

## 2018-03-13 MED ORDER — CIPROFLOXACIN HCL 250 MG PO TABS: 500.0000 mg | ORAL_TABLET | Freq: Two times a day (BID) | ORAL | Status: DC

## 2018-03-13 MED ORDER — METRONIDAZOLE 500 MG PO TABS: 500.0000 mg | ORAL_TABLET | Freq: Three times a day (TID) | ORAL | Status: DC

## 2018-03-13 MED ORDER — AMOXICILLIN-POT CLAVULANATE 875-125 MG PO TABS: 1.0000 | ORAL_TABLET | Freq: Two times a day (BID) | ORAL | 0 refills | Status: DC

## 2018-03-13 MED ORDER — AMOXICILLIN-POT CLAVULANATE 875-125 MG PO TABS: 1.0000 | ORAL_TABLET | Freq: Two times a day (BID) | ORAL | Status: DC

## 2018-03-13 MED ORDER — METRONIDAZOLE 500 MG PO TABS: 500.0000 mg | ORAL_TABLET | Freq: Three times a day (TID) | ORAL | 0 refills | Status: DC

## 2018-03-13 MED ORDER — POTASSIUM CHLORIDE CRYS ER 20 MEQ PO TBCR: 20.0000 meq | EXTENDED_RELEASE_TABLET | Freq: Once | ORAL | Status: DC

## 2018-03-13 MED ORDER — CIPROFLOXACIN HCL 500 MG PO TABS: 500.0000 mg | ORAL_TABLET | Freq: Two times a day (BID) | ORAL | 0 refills | Status: DC

## 2018-03-13 NOTE — Plan of Care (Signed)
  Problem: Education: Goal: Knowledge of General Education information will improve Description Including pain rating scale, medication(s)/side effects and non-pharmacologic comfort measures 03/13/2018 1350 by Rance Muir, RN Outcome: Adequate for Discharge 03/13/2018 1031 by Rance Muir, RN Outcome: Progressing   Problem: Health Behavior/Discharge Planning: Goal: Ability to manage health-related needs will improve 03/13/2018 1350 by Rance Muir, RN Outcome: Adequate for Discharge 03/13/2018 1031 by Rance Muir, RN Outcome: Progressing   Problem: Clinical Measurements: Goal: Ability to maintain clinical measurements within normal limits will improve 03/13/2018 1350 by Rance Muir, RN Outcome: Adequate for Discharge 03/13/2018 1031 by Rance Muir, RN Outcome: Progressing Goal: Will remain free from infection 03/13/2018 1350 by Rance Muir, RN Outcome: Adequate for Discharge 03/13/2018 1031 by Rance Muir, RN Outcome: Progressing Goal: Diagnostic test results will improve 03/13/2018 1350 by Rance Muir, RN Outcome: Adequate for Discharge 03/13/2018 1031 by Rance Muir, RN Outcome: Progressing Goal: Respiratory complications will improve 03/13/2018 1350 by Rance Muir, RN Outcome: Adequate for Discharge 03/13/2018 1031 by Rance Muir, RN Outcome: Progressing Goal: Cardiovascular complication will be avoided 03/13/2018 1350 by Rance Muir, RN Outcome: Adequate for Discharge 03/13/2018 1031 by Rance Muir, RN Outcome: Progressing   Problem: Activity: Goal: Risk for activity intolerance will decrease 03/13/2018 1350 by Rance Muir, RN Outcome: Adequate for Discharge 03/13/2018 1031 by Rance Muir, RN Outcome: Progressing   Problem: Nutrition: Goal: Adequate nutrition will be maintained 03/13/2018 1350 by Rance Muir, RN Outcome: Adequate for Discharge 03/13/2018 1031 by Rance Muir, RN Outcome: Progressing   Problem: Coping: Goal: Level of anxiety will decrease 03/13/2018 1350 by Rance Muir, RN Outcome: Adequate for Discharge 03/13/2018  1031 by Rance Muir, RN Outcome: Progressing   Problem: Elimination: Goal: Will not experience complications related to bowel motility 03/13/2018 1350 by Rance Muir, RN Outcome: Adequate for Discharge 03/13/2018 1031 by Rance Muir, RN Outcome: Progressing Goal: Will not experience complications related to urinary retention 03/13/2018 1350 by Rance Muir, RN Outcome: Adequate for Discharge 03/13/2018 1031 by Rance Muir, RN Outcome: Progressing   Problem: Pain Managment: Goal: General experience of comfort will improve 03/13/2018 1350 by Rance Muir, RN Outcome: Adequate for Discharge 03/13/2018 1031 by Rance Muir, RN Outcome: Progressing   Problem: Safety: Goal: Ability to remain free from injury will improve 03/13/2018 1350 by Rance Muir, RN Outcome: Adequate for Discharge 03/13/2018 1031 by Rance Muir, RN Outcome: Progressing   Problem: Skin Integrity: Goal: Risk for impaired skin integrity will decrease 03/13/2018 1350 by Rance Muir, RN Outcome: Adequate for Discharge 03/13/2018 1031 by Rance Muir, RN Outcome: Progressing

## 2018-03-13 NOTE — Discharge Instructions (Signed)
Chronic Diarrhea Diarrhea is a condition in which a person passes frequent loose and watery stools. It can cause you to feel weak and dehydrated. Dehydration can make you tired and thirsty. It can also cause a dry mouth, decreased urination, and dark yellow urine. Diarrhea is a sign of another underlying problem, such as:  Infection.  Medication side effects.  Dietary intolerance, such as lactose intolerance.  Conditions such as celiac disease, irritable bowel syndrome (IBS), or inflammatory bowel disease (IBD). In most cases, diarrhea lasts 2-3 days. Diarrhea that lasts longer than 4 weeks is called long-lasting (chronic) diarrhea. It is important that you treat your diarrhea as told by your health care provider. Follow these instructions at home: Follow these recommendations as told by your health care provider. Eating and drinking  Take an oral rehydration solution (ORS). This is a drink that is designed to keep you hydrated. It can be found at pharmacies and retail stores.  Drink clear fluids, such as water, ice chips, diluted fruit juice, and low-calorie sports drinks.  Follow the diet recommended by your health care provider. You may need to avoid foods that trigger diarrhea for you.  Avoid foods and beverages that contain a lot of sugar or caffeine.  Avoid alcohol.  Avoid spicy or fatty foods. General instructions      Drink enough fluid to keep your urine clear or pale yellow.  Wash your hands often and after each diarrhea episode. If soap and water are not available, use hand sanitizer.  Make sure that all people in your household wash their hands well and often.  Take over-the-counter and prescription medicines only as told by your health care provider.  If you were prescribed an antibiotic medicine, take it as told by your health care provider. Do not stop taking the antibiotic even if you start to feel better.  Rest at home while you recover.  Watch your  condition for any changes.  Take a warm bath to relieve any burning or pain from frequent diarrhea episodes.  Keep all follow-up visits as told by your health care provider. This is important. Contact a health care provider if:  You have a fever.  Your diarrhea gets worse or does not get better.  You have new symptoms.  You cannot drink fluids without vomiting.  You feel light-headed or dizzy.  You have a headache.  You have muscle cramps.  You have severe pain in the rectum. Get help right away if:  You have persistent vomiting.  You have chest pain.  You feel extremely weak or you faint.  You have bloody or black stools, or stools that look like tar.  You have severe pain, cramping, or bloating in your abdomen, or pain that stays in one place.  You have trouble breathing or you are breathing very quickly.  Your heart is beating very quickly.  Your skin feels cold and clammy.  You feel confused.  You have a severe headache.  You have signs of dehydration, such as: ? Dark urine, very little urine, or no urine. ? Cracked lips. ? Dry mouth. ? Sunken eyes. ? Sleepiness. ? Weakness. Summary  Chronic diarrhea is a condition in which a person passes frequent loose and watery stools for more than 4 weeks.  Diarrhea is a sign of another underlying problem.  Drink enough fluid to keep your urine clear or pale yellow to avoid dehydration.  Wash your hands often and after each diarrhea episode. If soap and water are   not available, use hand sanitizer.  It is important that you treat your diarrhea as told by your health care provider. This information is not intended to replace advice given to you by your health care provider. Make sure you discuss any questions you have with your health care provider. Document Released: 04/15/2003 Document Revised: 12/13/2015 Document Reviewed: 12/13/2015 Elsevier Interactive Patient Education  2019 [employer]  

## 2018-03-13 NOTE — Plan of Care (Signed)

## 2018-03-13 NOTE — Progress Notes (Signed)
Subjective:  Feels better. Tolerating advance diet. Denies abd pain. Wants to go home. One loose stool yesterday. No blood.   Objective: Vital signs in last 24 hours: Temp:  [98.2 F (36.8 C)-98.4 F (36.9 C)] 98.4 F (36.9 C) (02/05 0458) Pulse Rate:  [66-78] 66 (02/05 0458) Resp:  [16-18] 16 (02/04 2016) BP: (115-119)/(65-68) 115/65 (02/05 0458) SpO2:  [98 %-100 %] 100 % (02/05 0458) Last BM Date: 03/11/18 General:   Alert,  Well-developed, well-nourished, pleasant and cooperative in NAD Head:  Normocephalic and atraumatic. Eyes:  Sclera clear, no icterus.  Abdomen:  Soft, nontender and nondistended.  Normal bowel sounds, without guarding, and without rebound.   Extremities:  Without clubbing, deformity or edema. Neurologic:  Alert and  oriented x4;  grossly normal neurologically. Skin:  Intact without significant lesions or rashes. Psych:  Alert and cooperative. Normal mood and affect.  Intake/Output from previous day: 02/04 0701 - 02/05 0700 In: 120 [P.O.:120] Out: 1400 [Urine:1400] Intake/Output this shift: No intake/output data recorded.  Lab Results: CBC Recent Labs    03/11/18 0246 03/12/18 0531 03/13/18 0553  WBC 14.8* 9.4 6.0  HGB 11.7* 10.4* 11.0*  HCT 34.1* 30.8* 32.9*  MCV 88.3 89.0 90.9  PLT 237 196 208   BMET Recent Labs    03/11/18 0246 03/12/18 0531 03/13/18 0553  NA 132* 137 137  K 3.3* 3.0* 3.2*  CL 100 109 108  CO2 23 21* 21*  GLUCOSE 201* 135* 152*  BUN 31* 21 14  CREATININE 1.72* 1.79* 1.42*  CALCIUM 7.9* 8.2* 8.5*   LFTs Recent Labs    03/10/18 1256  BILITOT 0.6  ALKPHOS 120  AST 25  ALT 13  PROT 8.4*  ALBUMIN 4.1   No results for input(s): LIPASE in the last 72 hours. PT/INR No results for input(s): LABPROT, INR in the last 72 hours.    Imaging Studies: Ct Abdomen Pelvis Wo Contrast  Result Date: 03/10/2018 CLINICAL DATA:  Flu like symptoms. Nausea vomiting and diarrhea. EXAM: CT ABDOMEN AND PELVIS WITHOUT CONTRAST  TECHNIQUE: Multidetector CT imaging of the abdomen and pelvis was performed following the standard protocol without IV contrast. COMPARISON:  07/23/2015 FINDINGS: Lower chest: Coronary artery calcification is evident. Hepatobiliary: No focal abnormality in the liver on this study without intravenous contrast. Gallbladder surgically absent. No intrahepatic or extrahepatic biliary dilation. Pancreas: Unremarkable. No pancreatic ductal dilatation or surrounding inflammatory changes. Spleen: Normal in size without focal abnormality. Adrenals/Urinary Tract: No adrenal nodule or mass. stable 11 mm exophytic lesion upper pole right kidney. 8 mm exophytic lesion lower pole left kidney is similar to prior. No hydronephrosis. No evidence for hydroureter. The urinary bladder appears normal for the degree of distention. Stomach/Bowel: Stomach is unremarkable. No gastric wall thickening. No evidence of outlet obstruction. Duodenum is normally positioned as is the ligament of Treitz. No small bowel wall thickening. No small bowel dilatation. The terminal ileum is normal. The appendix is normal. Diverticular changes are noted in the left colon however there is some pericolonic edema/inflammation associated with the distal descending and proximal sigmoid segments. Vascular/Lymphatic: There is abdominal aortic atherosclerosis without aneurysm. Bilateral common iliac artery stents evident. There is no gastrohepatic or hepatoduodenal ligament lymphadenopathy. No intraperitoneal or retroperitoneal lymphadenopathy. No pelvic sidewall lymphadenopathy. Reproductive: The uterus is unremarkable.  There is no adnexal mass. Other: No intraperitoneal free fluid. Musculoskeletal: No worrisome lytic or sclerotic osseous abnormality. IMPRESSION: 1. Left colonic diverticulosis with pericolonic edema/inflammation around the distal descending and proximal sigmoid segments. Imaging  features are compatible with diverticulitis. No perforation or  abscess. 2. Stable appearance bilateral renal lesions, likely cysts. 3. Aortic Atherosclerois (ICD10-170.0) Electronically Signed   By: Misty Stanley M.D.   On: 03/10/2018 15:26  [2 weeks]   Assessment: 68 year old female presenting with acute abdominal pain, loose stools, subsequent rectal bleeding.found to have SBP in the 60s by EMS. CT (without contrast) findings consistent with diverticulitis.  Started on Rocephin and Flagyl on admission. Rectal bleeding has tapered.  Abdominal pain improved.     Plan: 1. Complete 7 to 10 days of antibiotics for diverticulitis. 2. Keep appointment scheduled on April 23, 2018 with [place]. 3. Will sign off, call with any questions.   Laureen Ochs. Bernarda Caffey [facility] [phone] 2/5/20209:45 AM     LOS: 2 days

## 2018-03-13 NOTE — Discharge Summary (Addendum)
Physician Discharge Summary  NGOZI ALVIDREZ YHC:[id number] DOB: 08/04/1950 DOA: 03/10/2018  PCP: Mikey Kirschner, MD  Admit date: 03/10/2018 Discharge date: 03/13/2018  Admitted From: Home Disposition:  Home   Recommendations for Outpatient Follow-up:  1. Follow up with PCP in 1-2 weeks 2. Please obtain BMP/CBC in one week    Discharge Condition: Stable CODE STATUS: FULL Diet recommendation: Heart Healthy / Carb Modified    Brief/Interim Summary: 68 year old female with a history of diabetes mellitus type 2, hypertension, COPD, hypothyroidism, CAD, hyperlipidemia presenting with abdominal pain andloose stools. The patient underwent a colonoscopy on 02/14/2018. EGD showed a single distal esophageal erosion and erosive gastropathy. Colonoscopy showed sigmoid distal and descending colon diverticulosis. There was also 3 sessile polyps in the descending and ascending colon. The patient states that she has felt a bit more constipated since her colonoscopy. She states that she had not had a bowel movement since 03/06/2018 until the day of admission. She began having lower abdominal pain around 03/08/2018. She denies any fevers, chills, but began having nausea and vomiting starting 03/10/2018. Because of abdominal pain and vomiting, the patient presents emergency department for further evaluation. Upon EMS arrival, the patient was noted to have generalized weakness with systolic blood pressure in the 70s. Upon presentation, patient was noted to have serum creatinine 2.03 and WBC 21.0. CT of the abdomen and pelvis showed left colonic diverticulosis with pericolonic edema and inflammation at the distal descending and proximal sigmoid colon. The patient was started ceftriaxone and metronidazole.  Discharge Diagnoses:  Acute diverticulitis -Continue ceftriaxone and metronidazole -Continue IV fluids -Patient requesting advancement in diet-->advance to dys 3 -d/c home with amox/clav x 7 more  days to complete 10 day course  Hematochezia -Likely related to the patient's acute diverticulitis; possible ischemic colitis -Appreciate GI consult-->advance diet -Drop in hemoglobin partly dilution -Hgb remains stable -resolved on 03/13/18  Acute kidney injury -Secondary to volume depletion and hypotension -Baseline creatinine 0.9-1.0 -Presenting creatinine 2.03 -continue IVF -serum creatinine 1.42 on day of d/c  Sepsis -Patient presented with hypotension and lactic acid peaked at 4.2 -Secondary to acute diverticulitis -Continue ceftriaxone and metronidazole -Continue IV fluids -Lactic acid has improved -sepsis physiology resolved  Diabetes mellitus type 2 -Start reduced home dose Lantus -NovoLog sliding scale -Holding metforminand ozempic--restart after d/c  Hyperlipidemia -Continue statin  Hypothyroidism -Continue Synthroid  Peripheral arterial disease -Holding aspirin and Plavix in the setting of hematochezia-->restart after d/c -Holding Lovenox  Essential hypertension -Holding HCTZ secondary to soft blood pressure--will not restart -BP well controlled  COPD -stable on RA -continue Incruse  Hypokalemia -repleted -add K to IVF -check mag--2.4  Hyponatremia -due to volume depletion and HCTZ -hold HCTZ -improved  Hyperlipidemia -continue crestor    Discharge Instructions   Allergies as of 03/13/2018      Reactions   Altace [ramipril] Cough   Biaxin [clarithromycin] Other (See Comments)   Fatigue   Niacin And Related Other (See Comments)   Flush - felt like on fire.   Nsaids Other (See Comments)   feverish   Tramadol Other (See Comments)   Lethargic    Adhesive [tape] Rash, Other (See Comments)   Paper tape is ok      Medication List    STOP taking these medications   hydrochlorothiazide 12.5 MG capsule Commonly known as:  MICROZIDE     TAKE these medications   albuterol 108 (90 Base) MCG/ACT inhaler Commonly known  as:  PROAIR HFA Inhale 2 puffs into the lungs  every 4 (four) hours as needed for wheezing or shortness of breath.   amoxicillin-clavulanate 875-125 MG tablet Commonly known as:  AUGMENTIN Take 1 tablet by mouth every 12 (twelve) hours.   aspirin EC 81 MG tablet Take 81 mg by mouth daily.   clopidogrel 75 MG tablet Commonly known as:  PLAVIX Take 1 tablet (75 mg total) by mouth daily.   fluticasone 50 MCG/ACT nasal spray Commonly known as:  FLONASE Place 2 sprays into both nostrils 2 (two) times daily. What changed:  when to take this   gabapentin 400 MG capsule Commonly known as:  NEURONTIN TAKE ONE CAPSULE BY MOUTH AT BEDTIME AS DIRECTED What changed:    how much to take  how to take this  when to take this  additional instructions   insulin aspart 100 UNIT/ML FlexPen Commonly known as:  NOVOLOG FLEXPEN Inject 7 Units into the skin 3 (three) times daily with meals.   Insulin Glargine 100 UNIT/ML Solostar Pen Commonly known as:  LANTUS SOLOSTAR Inject 28 Units into the skin daily.   levothyroxine 100 MCG tablet Commonly known as:  SYNTHROID, LEVOTHROID TAKE 1 TABLET BY MOUTH EVERY DAY BEFORE BREAKFAST What changed:  See the new instructions.   meclizine 25 MG tablet Commonly known as:  ANTIVERT Take 1 tablet (25 mg total) by mouth 3 (three) times daily as needed for dizziness.   metFORMIN 500 MG 24 hr tablet Commonly known as:  GLUCOPHAGE XR Take 1 tablet (500 mg total) by mouth 2 (two) times daily with a meal.   pantoprazole 40 MG tablet Commonly known as:  PROTONIX   potassium chloride SA 20 MEQ tablet Commonly known as:  K-DUR,KLOR-CON Take 1 tablet (20 mEq total) by mouth 2 (two) times daily.   rosuvastatin 40 MG tablet Commonly known as:  CRESTOR TAKE 1 TABLET EVERY DAY AT BEDTIME What changed:    how much to take  how to take this  when to take this  additional instructions   Semaglutide(0.25 or 0.5MG /DOS) 2 MG/1.5ML Sopn Commonly known  as:  OZEMPIC (0.25 OR 0.5 MG/DOSE) Inject 0.25 mg into the skin once a week.   tiotropium 18 MCG inhalation capsule Commonly known as:  SPIRIVA Place 1 capsule (18 mcg total) into inhaler and inhale daily.       Allergies  Allergen Reactions  . Altace [Ramipril] Cough  . Biaxin [Clarithromycin] Other (See Comments)    Fatigue  . Niacin And Related Other (See Comments)    Flush - felt like on fire.  . Nsaids Other (See Comments)    feverish  . Tramadol Other (See Comments)    Lethargic   . Adhesive [Tape] Rash and Other (See Comments)    Paper tape is ok    Consultations:  GI   Procedures/Studies: Ct Abdomen Pelvis Wo Contrast  Result Date: 03/10/2018 CLINICAL DATA:  Flu like symptoms. Nausea vomiting and diarrhea. EXAM: CT ABDOMEN AND PELVIS WITHOUT CONTRAST TECHNIQUE: Multidetector CT imaging of the abdomen and pelvis was performed following the standard protocol without IV contrast. COMPARISON:  07/23/2015 FINDINGS: Lower chest: Coronary artery calcification is evident. Hepatobiliary: No focal abnormality in the liver on this study without intravenous contrast. Gallbladder surgically absent. No intrahepatic or extrahepatic biliary dilation. Pancreas: Unremarkable. No pancreatic ductal dilatation or surrounding inflammatory changes. Spleen: Normal in size without focal abnormality. Adrenals/Urinary Tract: No adrenal nodule or mass. stable 11 mm exophytic lesion upper pole right kidney. 8 mm exophytic lesion lower pole left kidney is  similar to prior. No hydronephrosis. No evidence for hydroureter. The urinary bladder appears normal for the degree of distention. Stomach/Bowel: Stomach is unremarkable. No gastric wall thickening. No evidence of outlet obstruction. Duodenum is normally positioned as is the ligament of Treitz. No small bowel wall thickening. No small bowel dilatation. The terminal ileum is normal. The appendix is normal. Diverticular changes are noted in the left colon  however there is some pericolonic edema/inflammation associated with the distal descending and proximal sigmoid segments. Vascular/Lymphatic: There is abdominal aortic atherosclerosis without aneurysm. Bilateral common iliac artery stents evident. There is no gastrohepatic or hepatoduodenal ligament lymphadenopathy. No intraperitoneal or retroperitoneal lymphadenopathy. No pelvic sidewall lymphadenopathy. Reproductive: The uterus is unremarkable.  There is no adnexal mass. Other: No intraperitoneal free fluid. Musculoskeletal: No worrisome lytic or sclerotic osseous abnormality. IMPRESSION: 1. Left colonic diverticulosis with pericolonic edema/inflammation around the distal descending and proximal sigmoid segments. Imaging features are compatible with diverticulitis. No perforation or abscess. 2. Stable appearance bilateral renal lesions, likely cysts. 3. Aortic Atherosclerois (ICD10-170.0) Electronically Signed   By: Misty Stanley M.D.   On: 03/10/2018 15:26        Discharge Exam: Vitals:   03/13/18 0458 03/13/18 0844  BP: 115/65   Pulse: 66   Resp:    Temp: 98.4 F (36.9 C)   SpO2: 100% 100%   Vitals:   03/12/18 1705 03/12/18 2016 03/13/18 0458 03/13/18 0844  BP: 117/68 119/67 115/65   Pulse: 78 78 66   Resp: 18 16    Temp: 98.2 F (36.8 C) 98.3 F (36.8 C) 98.4 F (36.9 C)   TempSrc: Oral Oral Oral   SpO2: 99% 98% 100% 100%  Weight:      Height:        General: Pt is alert, awake, not in acute distress Cardiovascular: RRR, S1/S2 +, no rubs, no gallops Respiratory: CTA bilaterally, no wheezing, no rhonchi Abdominal: Soft, NT, ND, bowel sounds + Extremities: no edema, no cyanosis   The results of significant diagnostics from this hospitalization (including imaging, microbiology, ancillary and laboratory) are listed below for reference.    Significant Diagnostic Studies: Ct Abdomen Pelvis Wo Contrast  Result Date: 03/10/2018 CLINICAL DATA:  Flu like symptoms. Nausea  vomiting and diarrhea. EXAM: CT ABDOMEN AND PELVIS WITHOUT CONTRAST TECHNIQUE: Multidetector CT imaging of the abdomen and pelvis was performed following the standard protocol without IV contrast. COMPARISON:  07/23/2015 FINDINGS: Lower chest: Coronary artery calcification is evident. Hepatobiliary: No focal abnormality in the liver on this study without intravenous contrast. Gallbladder surgically absent. No intrahepatic or extrahepatic biliary dilation. Pancreas: Unremarkable. No pancreatic ductal dilatation or surrounding inflammatory changes. Spleen: Normal in size without focal abnormality. Adrenals/Urinary Tract: No adrenal nodule or mass. stable 11 mm exophytic lesion upper pole right kidney. 8 mm exophytic lesion lower pole left kidney is similar to prior. No hydronephrosis. No evidence for hydroureter. The urinary bladder appears normal for the degree of distention. Stomach/Bowel: Stomach is unremarkable. No gastric wall thickening. No evidence of outlet obstruction. Duodenum is normally positioned as is the ligament of Treitz. No small bowel wall thickening. No small bowel dilatation. The terminal ileum is normal. The appendix is normal. Diverticular changes are noted in the left colon however there is some pericolonic edema/inflammation associated with the distal descending and proximal sigmoid segments. Vascular/Lymphatic: There is abdominal aortic atherosclerosis without aneurysm. Bilateral common iliac artery stents evident. There is no gastrohepatic or hepatoduodenal ligament lymphadenopathy. No intraperitoneal or retroperitoneal lymphadenopathy. No pelvic sidewall lymphadenopathy.  Reproductive: The uterus is unremarkable.  There is no adnexal mass. Other: No intraperitoneal free fluid. Musculoskeletal: No worrisome lytic or sclerotic osseous abnormality. IMPRESSION: 1. Left colonic diverticulosis with pericolonic edema/inflammation around the distal descending and proximal sigmoid segments. Imaging  features are compatible with diverticulitis. No perforation or abscess. 2. Stable appearance bilateral renal lesions, likely cysts. 3. Aortic Atherosclerois (ICD10-170.0) Electronically Signed   By: Misty Stanley M.D.   On: 03/10/2018 15:26     Microbiology: Recent Results (from the past 240 hour(s))  Blood Culture (routine x 2)     Status: None (Preliminary result)   Collection Time: 03/10/18 12:56 PM  Result Value Ref Range Status   Specimen Description   Final    BLOOD RIGHT ANTECUBITAL Blood Culture adequate volume BOTTLES DRAWN AEROBIC AND ANAEROBIC   Special Requests NONE  Final   Culture   Final    NO GROWTH 3 DAYS Performed at [facility], [address], [place], [place] [address]    Report Status PENDING  Incomplete  Blood Culture (routine x 2)     Status: None (Preliminary result)   Collection Time: 03/10/18  1:46 PM  Result Value Ref Range Status   Specimen Description RIGHT ANTECUBITAL  Final   Special Requests   Final    BOTTLES DRAWN AEROBIC AND ANAEROBIC Blood Culture adequate volume   Culture   Final    NO GROWTH 3 DAYS Performed at [facility], [address] [place], [place] [address]    Report Status PENDING  Incomplete     Labs: Basic Metabolic Panel: Recent Labs  Lab 03/10/18 1256 03/10/18 1556 03/11/18 0246 03/12/18 0531 03/13/18 0553  NA 132*  --  132* 137 137  K 2.4*  --  3.3* 3.0* 3.2*  CL 90*  --  100 109 108  CO2 22  --  23 21* 21*  GLUCOSE 286*  --  201* 135* 152*  BUN 36*  --  31* 21 14  CREATININE 2.03*  --  1.72* 1.79* 1.42*  CALCIUM 9.2  --  7.9* 8.2* 8.5*  MG  --  2.8*  --  2.4  --    Liver Function Tests: Recent Labs  Lab 03/10/18 1256  AST 25  ALT 13  ALKPHOS 120  BILITOT 0.6  PROT 8.4*  ALBUMIN 4.1   No results for input(s): LIPASE, AMYLASE in the last 168 hours. No results for input(s): AMMONIA in the last 168 hours. CBC: Recent Labs  Lab 03/10/18 1256 03/11/18 0246 03/12/18 0531 03/13/18 0553  WBC 21.0* 14.8*  9.4 6.0  NEUTROABS 18.0*  --   --   --   HGB 15.4* 11.7* 10.4* 11.0*  HCT 45.7 34.1* 30.8* 32.9*  MCV 87.9 88.3 89.0 90.9  PLT 325 237 196 208   Cardiac Enzymes: No results for input(s): CKTOTAL, CKMB, CKMBINDEX, TROPONINI in the last 168 hours. BNP: Invalid input(s): POCBNP CBG: Recent Labs  Lab 03/12/18 2017 03/12/18 2359 03/13/18 0500 03/13/18 0801 03/13/18 1114  GLUCAP 234* 203* 159* 158* 206*    Time coordinating discharge:  36 minutes  Signed:  Orson Eva, DO [facility] Pager: [phone] 03/13/2018, 6:20 PM

## 2018-03-15 LAB — CULTURE, BLOOD (ROUTINE X 2)
CULTURE: NO GROWTH
Culture: NO GROWTH
Special Requests: ADEQUATE

## 2018-03-19 ENCOUNTER — Ambulatory Visit: Payer: Medicare HMO | Admitting: Family Medicine

## 2018-03-19 ENCOUNTER — Other Ambulatory Visit: Payer: Self-pay

## 2018-03-19 NOTE — Patient Outreach (Signed)
[facility]) Care Management  03/19/2018  MAIGAN BITTINGER [birth year] [id number]   EMMI- General Discharge RED ON EMMI ALERT Day # 4 Date: 03/18/2018 Red Alert Reason:  Lost interest in things? yes  Outreach attempt: spoke with patient.  She is able to verify HIPAA. Patient reports that she feels some down due to her diet restrictions and tying to not feel like she did with the diverticulitis.  Discussed with patient foods to eat and not to eat with diverticulitis.  She verbalized understanding and feels she will be ok once the initial shock of diverticulitis and the diet wears off.  Patient declines any further needs or concerns.     Plan: RN CM will close case.    Jone Baseman, RN, MSN [facility] Care Management Care Management Coordinator Direct Line [phone] Toll Free: [phone]  Fax: [phone]

## 2018-03-20 IMAGING — US US ABDOMEN LIMITED
1 series · 14 of 25 positions shown · non-contrast
Comparison: None.

CLINICAL DATA: Changes suggestive of cholecystitis on recent CT
examination.

EXAM:
US ABDOMEN LIMITED - RIGHT UPPER QUADRANT

[Series 1: us abdomen limited · 0.28mm/px · 14 of 47 slices shown]
[im 1/47]
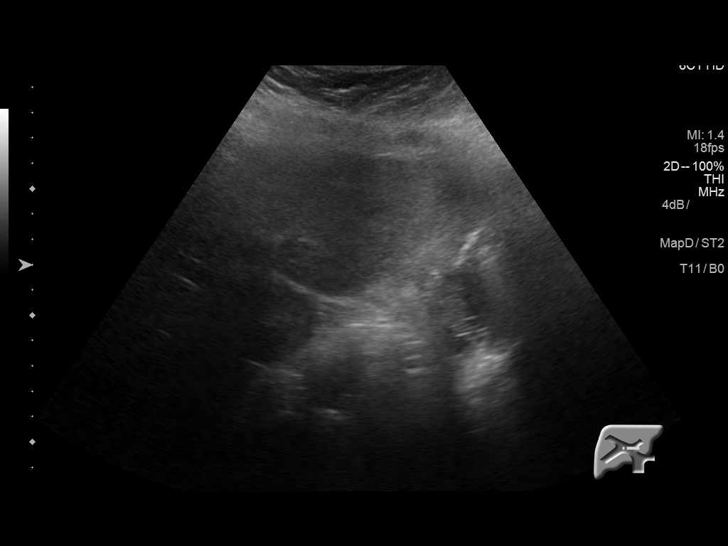
[im 4/47]
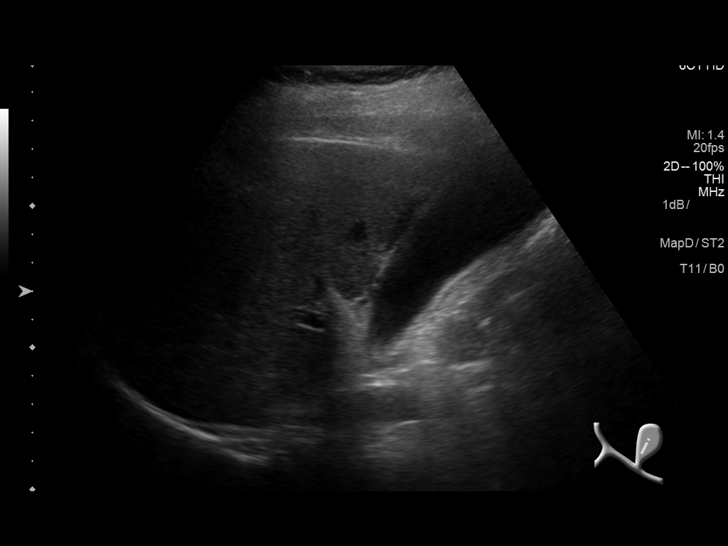
[im 8/47]
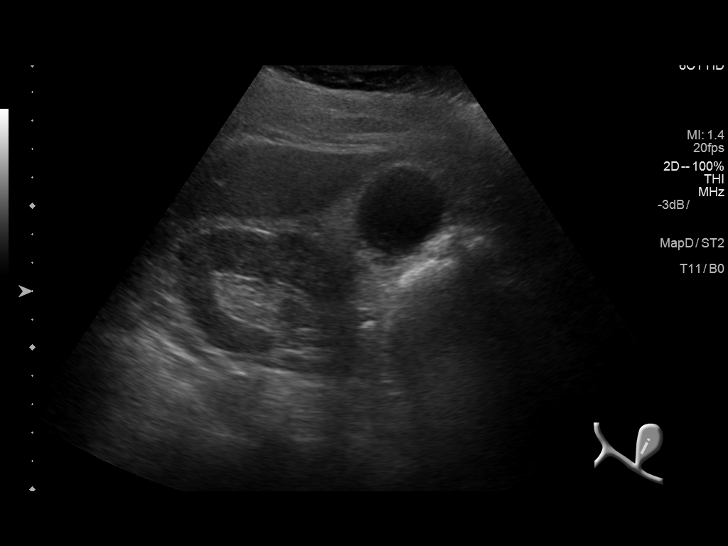
[im 12/47]
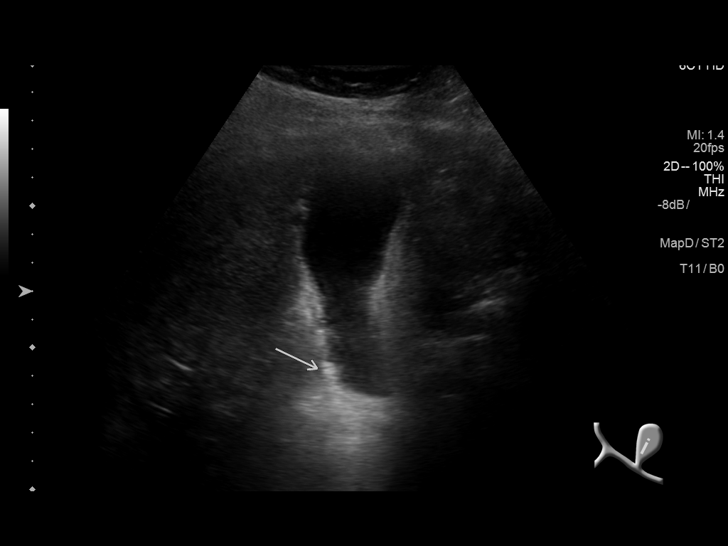
[im 16/47]
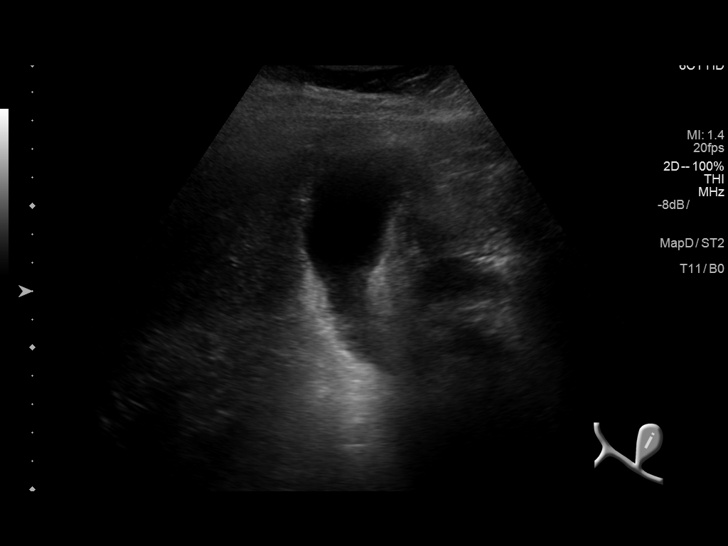
[im 18/47]
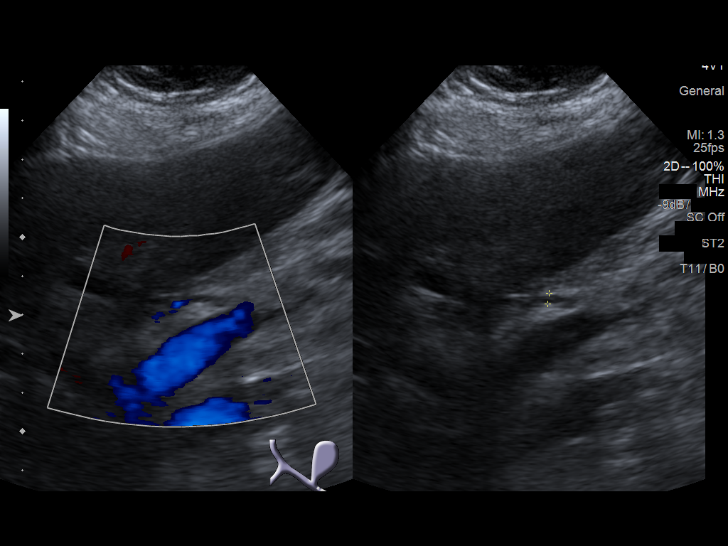
[im 22/47]
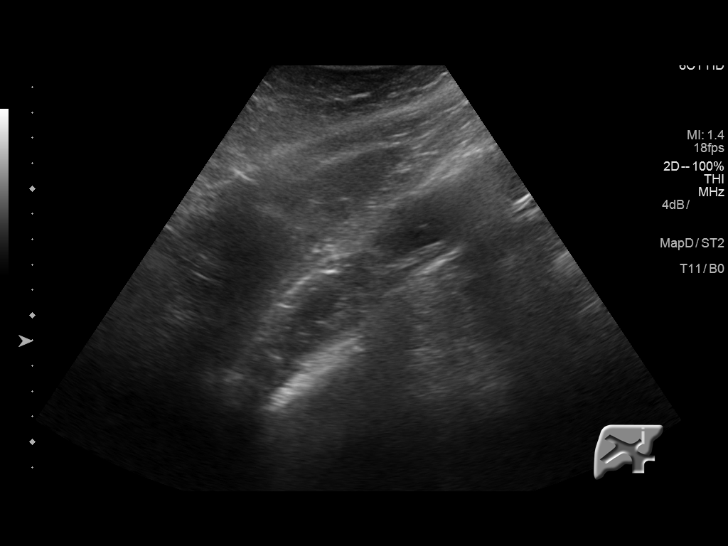
[im 25/47]
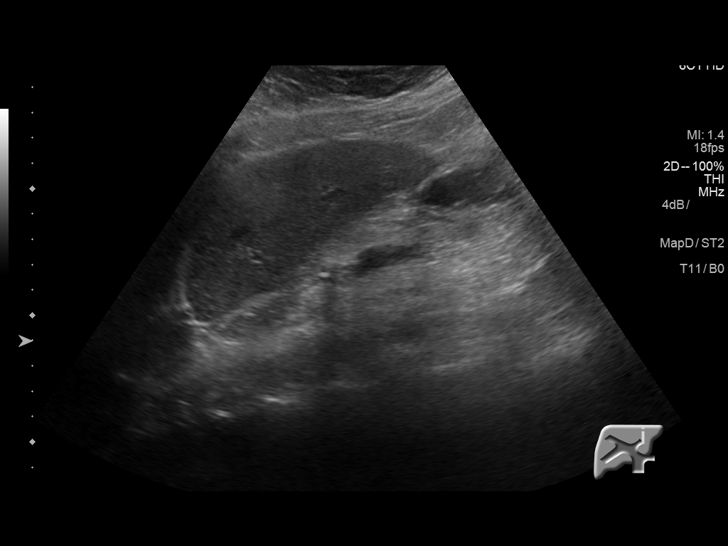
[im 29/47]
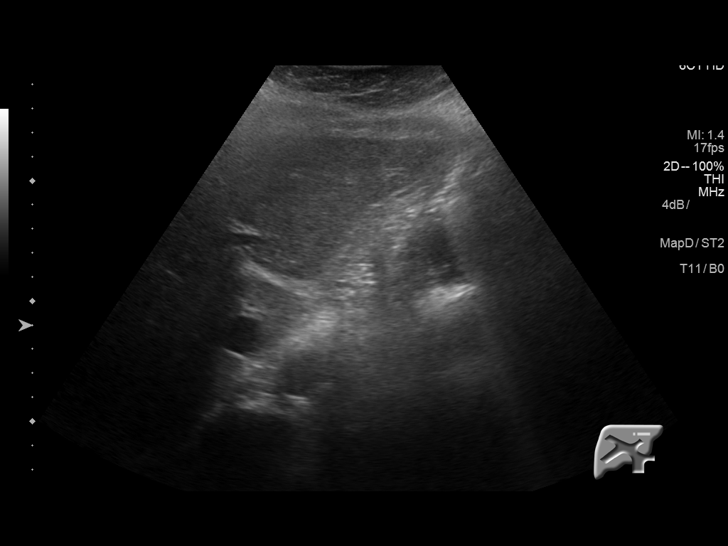
[im 31/47]
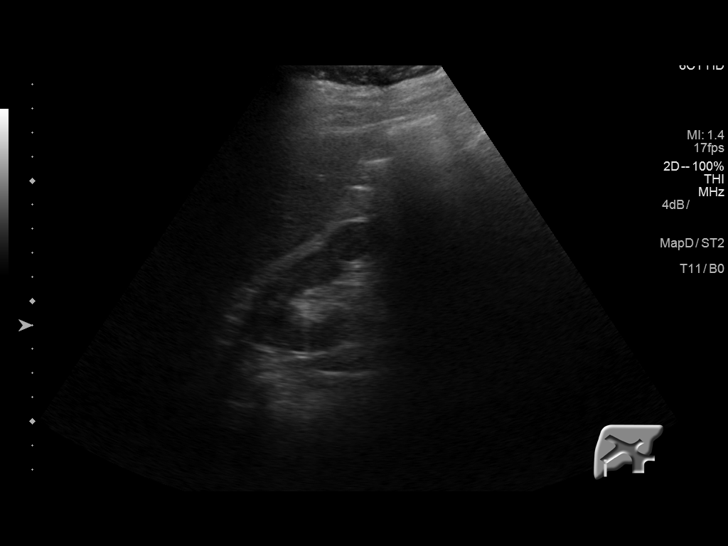
[im 35/47]
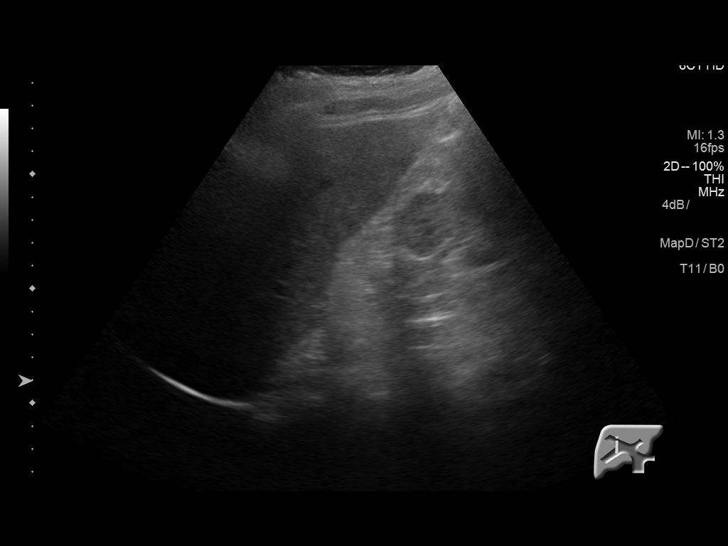
[im 39/47]
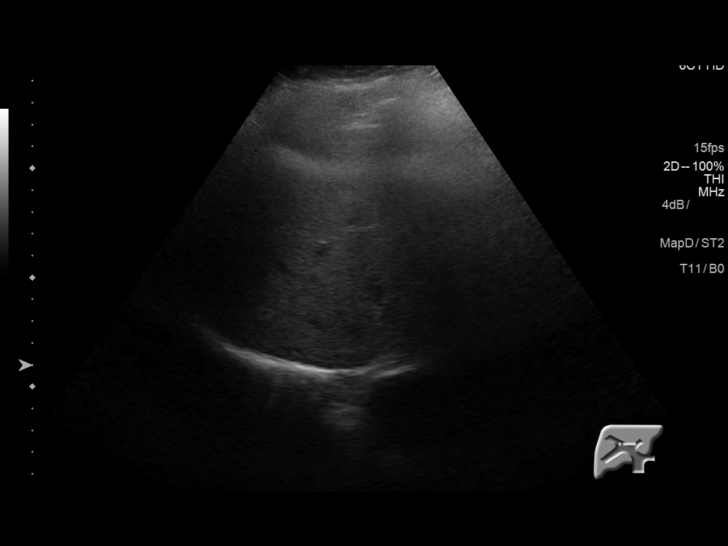
[im 43/47]
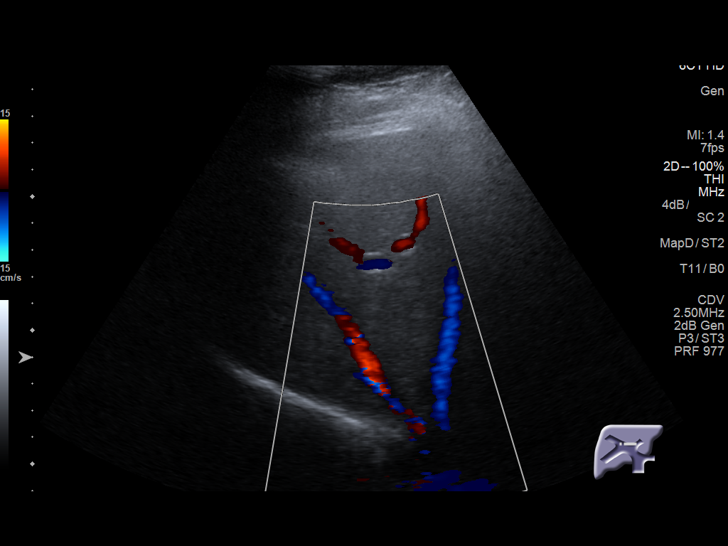
[im 47/47]
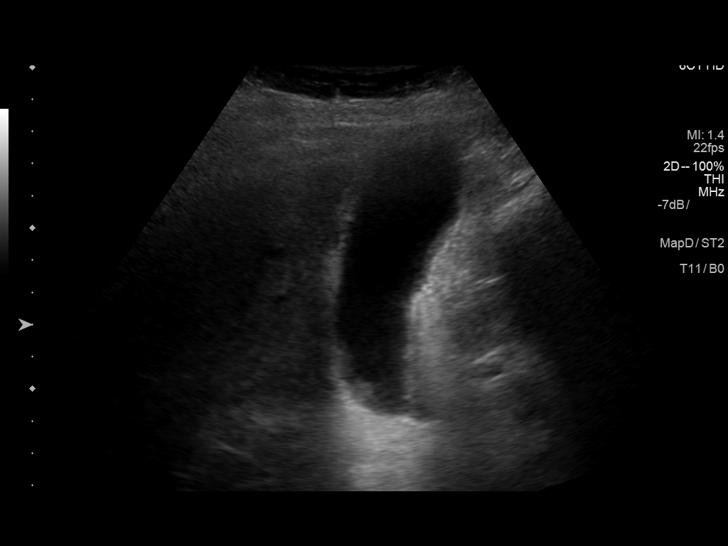

[14 of 25 positions shown; findings below may reference images not displayed]

FINDINGS: Gallbladder:

Gallbladder wall thickening to 9 mm is noted. Mild pericholecystic
fluid is noted. Few small gallstones are seen.

Common bile duct:

Diameter: 5 mm.

Liver:

No focal lesion identified. Within normal limits in parenchymal
echogenicity.
IMPRESSION: Gallbladder wall thickening and pericholecystic fluid with evidence
of cholelithiasis. These changes are consistent with acute
cholecystitis.

## 2018-03-21 ENCOUNTER — Ambulatory Visit (INDEPENDENT_AMBULATORY_CARE_PROVIDER_SITE_OTHER): Payer: Medicare HMO | Admitting: Family Medicine

## 2018-03-21 ENCOUNTER — Encounter: Payer: Self-pay | Admitting: Family Medicine

## 2018-03-21 VITALS — BP 116/74 | Temp 98.2°F | Ht 63.0 in | Wt 151.0 lb

## 2018-03-21 DIAGNOSIS — I10 Essential (primary) hypertension: Secondary | ICD-10-CM | POA: Diagnosis not present

## 2018-03-21 DIAGNOSIS — Z79899 Other long term (current) drug therapy: Secondary | ICD-10-CM | POA: Diagnosis not present

## 2018-03-21 DIAGNOSIS — K5792 Diverticulitis of intestine, part unspecified, without perforation or abscess without bleeding: Secondary | ICD-10-CM | POA: Diagnosis not present

## 2018-03-21 NOTE — Progress Notes (Signed)
   Subjective:    Patient ID: Laurence Slate, female    DOB: [birth year], 68 y.o.   MRN: [id number]  HPIpt arrives for a hospitalization follow up on diverticulitis. States she is doing much better now. No pain. Taking augmentin.   Energy level not the bes Handling the antibiotic ok  No further blood instools               +  + Complete hospital record reviewed in the presence of the patient.  Also notes from hospitalist and specialist.  Also blood work.  Also imaging.  Patient reports overall her abdominal discomfort is nearly resolved.  Bowels are back to normal.  Compliant with her chronic medications   +  + +   Of note patient was contacted 24 hours after discharge ++ Review of Systems No headache, no major weight loss or weight gain, no chest pain no back pain abdominal pain no change in bowel habits complete ROS otherwise negative     Objective:   Physical Exam Alert and oriented, vitals reviewed and stable, NAD ENT-TM's and ext canals WNL bilat via otoscopic exam Soft palate, tonsils and post pharynx WNL via oropharyngeal exam Neck-symmetric, no masses; thyroid nonpalpable and nontender Pulmonary-no tachypnea or accessory muscle use; Clear without wheezes via auscultation Card--no abnrml murmurs, rhythm reg and rate WNL Carotid pulses symmetric, without bruits Abdomen no discrete tenderness       Assessment & Plan:  Impression status post diverticulitis.  Occurred soon after colonoscopy.  Patient states considerably improved  2.  Renal insufficiency.  See prior notes.  Potassium was well and creatinine elevated.  Will recheck met 7 next week  Follow-up regular appointment warning signs discussed

## 2018-03-24 DIAGNOSIS — E1165 Type 2 diabetes mellitus with hyperglycemia: Secondary | ICD-10-CM | POA: Diagnosis not present

## 2018-04-05 DIAGNOSIS — Z79899 Other long term (current) drug therapy: Secondary | ICD-10-CM | POA: Diagnosis not present

## 2018-04-06 LAB — CBC WITH DIFFERENTIAL/PLATELET
BASOS ABS: 0 10*3/uL (ref 0.0–0.2)
Basos: 1 %
EOS (ABSOLUTE): 0.2 10*3/uL (ref 0.0–0.4)
Eos: 2 %
Hematocrit: 37.7 % (ref 34.0–46.6)
Hemoglobin: 12.5 g/dL (ref 11.1–15.9)
Immature Grans (Abs): 0 10*3/uL (ref 0.0–0.1)
Immature Granulocytes: 1 %
Lymphocytes Absolute: 2.5 10*3/uL (ref 0.7–3.1)
Lymphs: 36 %
MCH: 30.9 pg (ref 26.6–33.0)
MCHC: 33.2 g/dL (ref 31.5–35.7)
MCV: 93 fL (ref 79–97)
Monocytes Absolute: 0.3 10*3/uL (ref 0.1–0.9)
Monocytes: 4 %
Neutrophils Absolute: 4 10*3/uL (ref 1.4–7.0)
Neutrophils: 56 %
PLATELETS: 309 10*3/uL (ref 150–450)
RBC: 4.05 x10E6/uL (ref 3.77–5.28)
RDW: 13.3 % (ref 11.7–15.4)
WBC: 7.1 10*3/uL (ref 3.4–10.8)

## 2018-04-06 LAB — BASIC METABOLIC PANEL
BUN/Creatinine Ratio: 9 — ABNORMAL LOW (ref 12–28)
BUN: 9 mg/dL (ref 8–27)
CHLORIDE: 103 mmol/L (ref 96–106)
CO2: 22 mmol/L (ref 20–29)
Calcium: 9.5 mg/dL (ref 8.7–10.3)
Creatinine, Ser: 0.97 mg/dL (ref 0.57–1.00)
GFR calc Af Amer: 71 mL/min/{1.73_m2} (ref >59)
GFR calc non Af Amer: 61 mL/min/{1.73_m2} (ref >59)
Glucose: 170 mg/dL — ABNORMAL HIGH (ref 65–99)
Potassium: 3.6 mmol/L (ref 3.5–5.2)
Sodium: 141 mmol/L (ref 134–144)

## 2018-04-16 ENCOUNTER — Ambulatory Visit (HOSPITAL_COMMUNITY)
Admission: RE | Admit: 2018-04-16 | Payer: Medicare HMO | Source: Ambulatory Visit | Attending: Cardiovascular Disease | Admitting: Cardiovascular Disease

## 2018-04-18 ENCOUNTER — Ambulatory Visit (HOSPITAL_COMMUNITY): Payer: Medicare HMO

## 2018-04-23 ENCOUNTER — Encounter: Payer: Self-pay | Admitting: Nurse Practitioner

## 2018-04-23 ENCOUNTER — Other Ambulatory Visit: Payer: Self-pay

## 2018-04-23 ENCOUNTER — Ambulatory Visit: Payer: Medicare Other | Admitting: Nurse Practitioner

## 2018-04-23 VITALS — BP 150/87 | HR 97 | Temp 97.0°F | Ht 63.0 in | Wt 155.4 lb

## 2018-04-23 DIAGNOSIS — K5792 Diverticulitis of intestine, part unspecified, without perforation or abscess without bleeding: Secondary | ICD-10-CM

## 2018-04-23 DIAGNOSIS — K5900 Constipation, unspecified: Secondary | ICD-10-CM | POA: Diagnosis not present

## 2018-04-23 NOTE — Progress Notes (Signed)
cc'ed to pcp °

## 2018-04-23 NOTE — Patient Instructions (Signed)
Your health issues we discussed today were:   Diverticulitis: 1. I am glad you are feeling better! 2. I am printing out information related to diet as well as general information about diverticulitis 3. Add a fiber supplement daily.  Try to increase the amount of fiber you eat 4. Call us if you have any returning symptoms  Constipation: 1. If the fiber supplement I mentioned above does not help your constipation, start taking Colace (over-the-counter stool softener) once daily 2. Call us if you have any worsening or severe symptoms  Overall I recommend:  1. Return for follow-up in 4 months 2. Call if you have any questions or concerns.    Because of recent events of COVID-19 ("Coronavirus"), follow CDC recommendations:  1. Wash your hand frequently 2. Avoid touching your face 3. Stay away from people who are sick 4. If you have symptoms such as fever, cough, shortness of breath then call your healthcare provider for further guidance 5. If you are sick, STAY AT HOME unless otherwise directed by your healthcare provider. 6. Follow directions from state and national officials regarding staying safe    At [facility] we value your feedback. You may receive a survey about your visit today. Please share your experience as we strive to create trusting relationships with our patients to provide genuine, compassionate, quality care.  We appreciate your understanding and patience as we review any laboratory studies, imaging, and other diagnostic tests that are ordered as we care for you. Our office policy is 5 business days for review of these results, and any emergent or urgent results are addressed in a timely manner for your best interest. If you do not hear from our office in 1 week, please contact us.   We also encourage the use of MyChart, which contains your medical information for your review as well. If you are not enrolled in this feature, an access code is on this  after visit summary for your convenience. Thank you for allowing [place] to be involved in your care.  It was great to see you today!  I hope you have a great day!!

## 2018-04-23 NOTE — Progress Notes (Signed)
Referring Provider: Mikey Kirschner, MD Primary Care Physician:  Mikey Kirschner, MD Primary GI:  Dr. Gala Romney  Chief Complaint  Patient presents with  . Dysphagia    f/u. Patients feels like she can't eat anything and wants to know what she can eat  . Anemia  . Abdominal Pain    had 1st episode after eating on sunday    HPI:   Rachel [name] is a 68 y.o. female who presents for follow-up on dysphasia and anemia.  The patient was last seen in our office 12/25/2017 for IDA, GERD, dysphagia.  Prior to previous office visit there was a recent admission with profound anemia and hemoglobin around 6 indicating iron deficiency anemia.  She was heme-negative and transfused 2 units of PRBCs.  History of GERD and insidiously recurrent esophageal dysphagia to solid food.  Overall no clinical evidence of GI bleeding.  Takes ibuprofen multiple times a week.  Recommended colonoscopy and EGD.  Colonoscopy completed 02/14/2018 which found diverticulosis in the sigmoid and descending colon, three 4 to 6 mm polyps in the descending colon, ascending colon; otherwise normal.  Surgical pathology found the polyps to be tubular adenoma.  EGD completed the same day found mild erosive reflux esophagitis status post dilation, erosive gastropathy status post biopsy, gastric erosions s/p biopsy. 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  Recommended continue current medications, repeat colonoscopy in 3 years.  Since her procedures the patient was admitted to the hospital from 03/10/2021 to 03/13/2018.  She presented for lower abdominal pain which started around 03/08/2018 and notes that after colonoscopy she did not have much of a bowel movement.  Also noted recurrent vomiting.  She was hypotensive on presentation by EMS and found white blood cell count of 21 and creatinine of 2.03.  CT of the abdomen found colonic diverticulosis for which she was started on Rocephin and Flagyl.  She improved  and was discharged on Augmentin for 7 days.  Hematochezia likely due to acute diverticulitis possibly ischemic colitis.  Drop in hemoglobin partly dilutional.  Initial lactic acidosis improved during her admission.  Acute kidney injury improved throughout admission and was 2.03 on presentation and 1.43 on day of discharge.  Most recent labs completed 04/05/2018 which showed normalization of hemoglobin at 12.5 as well as normalization of kidney function with a creatinine of 0.97.  Today she states she's doing ok overall. Is still on Protonix bid. No GERD symptoms. Swallowing is doing well after the dilation. Before her proceudre she was having solid food dysphagia with regurgitation. No recurrence since the procedure. Did have an episode of abdominal pain 2-3 days ago generalized abdomen, described as "an ache", lasted for a couple hours. She ha da bowel movement before her pain stopped. Her bowel movements have been needing straining, feels constipated; stools are not hard, however. Has "multiple pellets." Will occasionally go "multiple days" without a bowel movement. Is not sure what to eat or take with diverticulitis. States she was told to avoid all beans, nuts, seeds, etc; she's confused on what she can and cannot eat. Admits she doesn't drink enough water. Eats adequate fiber (loves fruits and vegetables). Denies chest pain, dyspnea, dizziness, lightheadedness, syncope, near syncope. Denies any other upper or lower GI symptoms.  Past Medical History:  Diagnosis Date  . Asthma   . Coronary atherosclerosis of native coronary artery    DES x 2 (overlapping) RCA 03/2008 - patent 2011  . Diabetes type 2, controlled (  [place]) 2000  . Essential hypertension, benign   . Fatty liver   . GERD (gastroesophageal reflux disease)   . Hyperlipidemia   . Microalbuminuria   . Obesity   . Odynophagia    Severe esophagitis on CT 05/2010, treated empirically with Diflucan  . PAD (peripheral artery disease) ([facility])     history of left greater than right lower 70 claudication  . Tubular adenoma of colon 07/2007   Multiple colonic polyps  . Venous stasis     Past Surgical History:  Procedure Laterality Date  . BIOPSY  02/14/2018   Procedure: BIOPSY;  Surgeon: Daneil Dolin, MD;  [facility];  Service: Endoscopy;;  gastric bx's  . CARDIAC CATHETERIZATION     stents  . CHOLECYSTECTOMY N/A 07/30/2015   Procedure: LAPAROSCOPIC CHOLECYSTECTOMY;  Surgeon: Aviva Signs, MD;  Location: [facility];  Service: General;  Laterality: N/A;  . COLONOSCOPY W/ POLYPECTOMY  07/2007   Tubular adenoma  . COLONOSCOPY WITH PROPOFOL N/A 02/14/2018   Dr. Gala Romney: diverticulosis in sigmoid and descending colon, three 4-6 mm polyps in descending colon and ascending, tubular adenomas  . CORONARY STENT PLACEMENT    . CYSTECTOMY     Pilonidal  . ESOPHAGOGASTRODUODENOSCOPY  04/2010   RMR: Normal esophagus , Stomach D1 and D2   . ESOPHAGOGASTRODUODENOSCOPY (EGD) WITH PROPOFOL N/A 02/14/2018   Dr. Gala Romney: mild erosive reflux esophagitis, s/p dilatation, erosive gastropathy s/p biopsy, chronic inactive gastritis, negative H.pylori  . LOWER EXTREMITY ANGIOGRAPHY N/A 03/13/2016   Procedure: Lower Extremity Angiography;  Surgeon: [name], MD;  Location: [place] [facility];  Service: Cardiovascular;  Laterality: N/A;  . Venia Minks DILATION N/A 02/14/2018   Procedure: Venia Minks DILATION;  Surgeon: Daneil Dolin, MD;  [facility]: [facility];  Service: Endoscopy;  Laterality: N/A;  . PERIPHERAL VASCULAR ATHERECTOMY  03/27/2016   Procedure: Peripheral Vascular Atherectomy;  Surgeon: [name], MD;  Location: [place] [facility];  Service: Cardiovascular;;  Attempted atherectomy  . PERIPHERAL VASCULAR CATHETERIZATION Bilateral 09/28/2014   Procedure: Lower Extremity Angiography;  Surgeon: [name], MD;  Location: [place] [facility];  Service: Cardiovascular;  Laterality: Bilateral;  . PERIPHERAL VASCULAR CATHETERIZATION N/A  09/28/2014   Procedure: Abdominal Aortogram;  Surgeon: [name], MD;  Location: [place] [facility];  Service: Cardiovascular;  Laterality: N/A;  . PERIPHERAL VASCULAR CATHETERIZATION N/A 10/15/2014   Procedure: Lower Extremity Angiography;  Surgeon: [name], MD;  Location: [place] [facility];  Service: Cardiovascular;  Laterality: N/A;  . PERIPHERAL VASCULAR CATHETERIZATION  10/15/2014   Procedure: Peripheral Vascular Intervention;  Surgeon: [name], MD;  Location: [place] [facility];  Service: Cardiovascular;;  bilateral iliac stents  . PILONIDAL CYST / SINUS EXCISION    . POLYPECTOMY  02/14/2018   Procedure: POLYPECTOMY;  Surgeon: Daneil Dolin, MD;  [facility];  Service: Endoscopy;;  ascending colon polyps x2, descending polyps x3  . TUBAL LIGATION Bilateral     Current Outpatient Medications  Medication Sig Dispense Refill  . albuterol (PROAIR HFA) 108 (90 Base) MCG/ACT inhaler Inhale 2 puffs into the lungs every 4 (four) hours as needed for wheezing or shortness of breath. 1 Inhaler 5  . aspirin EC 81 MG tablet Take 81 mg by mouth daily.      . clopidogrel (PLAVIX) 75 MG tablet Take 1 tablet (75 mg total) by mouth daily. 90 tablet 1  . fluticasone (FLONASE) 50 MCG/ACT nasal spray Place 2  sprays into both nostrils 2 (two) times daily. (Patient taking differently: Place 2 sprays into both nostrils daily. ) 16 g 2  . gabapentin (NEURONTIN) 400 MG capsule TAKE ONE CAPSULE BY MOUTH AT BEDTIME AS DIRECTED (Patient taking differently: Take 400 mg by mouth at bedtime. ) 90 capsule 1  . insulin aspart (NOVOLOG FLEXPEN) 100 UNIT/ML FlexPen Inject 7 Units into the skin 3 (three) times daily with meals. 15 mL 3  . Insulin Glargine (LANTUS SOLOSTAR) 100 UNIT/ML Solostar Pen Inject 28 Units into the skin daily. 15 mL 6  . levothyroxine (SYNTHROID, LEVOTHROID) 100 MCG tablet TAKE 1 TABLET BY MOUTH EVERY DAY BEFORE BREAKFAST (Patient taking differently: Take 100 mcg by  mouth daily before breakfast. ) 90 tablet 0  . meclizine (ANTIVERT) 25 MG tablet Take 1 tablet (25 mg total) by mouth 3 (three) times daily as needed for dizziness. 30 tablet 2  . metFORMIN (GLUCOPHAGE XR) 500 MG 24 hr tablet Take 1 tablet (500 mg total) by mouth 2 (two) times daily with a meal. 60 tablet 11  . pantoprazole (PROTONIX) 40 MG tablet Take 40 mg by mouth 2 (two) times daily.     . potassium chloride SA (K-DUR,KLOR-CON) 20 MEQ tablet Take 1 tablet (20 mEq total) by mouth 2 (two) times daily. 30 tablet 0  . rosuvastatin (CRESTOR) 40 MG tablet TAKE 1 TABLET EVERY DAY AT BEDTIME (Patient taking differently: Take 40 mg by mouth at bedtime. ) 90 tablet 1  . Semaglutide,0.25 or 0.5MG /DOS, (OZEMPIC, 0.25 OR 0.5 MG/DOSE,) 2 MG/1.5ML SOPN Inject 0.25 mg into the skin once a week. 3 pen 11  . tiotropium (SPIRIVA) 18 MCG inhalation capsule Place 1 capsule (18 mcg total) into inhaler and inhale daily. 30 capsule 6   No current facility-administered medications for this visit.     Allergies as of 04/23/2018 - Review Complete 04/23/2018  Allergen Reaction Noted  . Altace [ramipril] Cough 06/07/2012  . Biaxin [clarithromycin] Other (See Comments) 06/07/2012  . Niacin and related Other (See Comments) 06/07/2012  . Nsaids Other (See Comments) 10/09/2014  . Tramadol Other (See Comments) 11/06/2017  . Adhesive [tape] Rash and Other (See Comments) 03/22/2016    Family History  Problem Relation Age of Onset  . Bone cancer Father   . Heart disease Maternal Grandmother   . Hypertension Mother   . Diabetes Mother   . Colon cancer Neg Hx   . Liver disease Neg Hx   . Inflammatory bowel disease Neg Hx     Social History   Socioeconomic History  . Marital status: Divorced    Spouse name: Not on file  . Number of children: 1  . Years of education: Not on file  . Highest education level: Not on file  Occupational History  . Occupation: retired  Scientific laboratory technician  . Financial resource strain: Not  on file  . Food insecurity:    Worry: Not on file    Inability: Not on file  . Transportation needs:    Medical: Not on file    Non-medical: Not on file  Tobacco Use  . Smoking status: Current Every Day Smoker    Packs/day: 0.25    Years: 50.00    Pack years: 12.50    Types: Cigarettes  . Smokeless tobacco: Never Used  Substance and Sexual Activity  . Alcohol use: No  . Drug use: No  . Sexual activity: Yes    Partners: Male    Birth control/protection: Condom  Comment: mutual friends  Lifestyle  . Physical activity:    Days per week: Not on file    Minutes per session: Not on file  . Stress: Not on file  Relationships  . Social connections:    Talks on phone: Not on file    Gets together: Not on file    Attends religious service: Not on file    Active member of club or organization: Not on file    Attends meetings of clubs or organizations: Not on file    Relationship status: Not on file  Other Topics Concern  . Not on file  Social History Narrative  . Not on file    Review of Systems: General: Negative for anorexia, weight loss, fever, chills, fatigue, weakness. ENT: Negative for hoarseness, difficulty swallowing. CV: Negative for chest pain, angina, palpitations, dyspnea on exertion.  Respiratory: Negative for dyspnea at rest, dyspnea on exertion, cough, sputum, wheezing.  GI: See history of present illness. Endo: Negative for unusual weight change.  Heme: Negative for bruising or bleeding. Allergy: Negative for rash or hives.   Physical Exam: BP (!) 150/87   Pulse 97   Temp (!) 97 F (36.1 C) (Oral)   Ht 5\' 3"  (1.6 m)   Wt 155 lb 6.4 oz (70.5 kg)   BMI 27.53 kg/m  General:   Alert and oriented. Pleasant and cooperative. Well-nourished and well-developed.  Eyes:  Without icterus, sclera clear and conjunctiva pink.  Ears:  Normal auditory acuity. Cardiovascular:  S1, S2 present without murmurs appreciated. Extremities without clubbing or edema.  Respiratory:  Clear to auscultation bilaterally. No wheezes, rales, or rhonchi. No distress.  Gastrointestinal:  +BS, soft, non-tender and non-distended. No HSM noted. No guarding or rebound. No masses appreciated.  Rectal:  Deferred  Musculoskalatal:  Symmetrical without gross deformities. Neurologic:  Alert and oriented x4;  grossly normal neurologically. Psych:  Alert and cooperative. Normal mood and affect. Heme/Lymph/Immune: No excessive bruising noted.    04/23/2018 11:27 AM   Disclaimer: This note was dictated with voice recognition software. Similar sounding words can inadvertently be transcribed and may not be corrected upon review.

## 2018-04-23 NOTE — Assessment & Plan Note (Signed)
The patient was recently admitted for acute diverticulitis incidentally after her previous colonoscopy.  She did well on antibiotics and was discharged home.  She completed her antibiotics.  She is currently asymptomatic today.  She has a lot of confusion about what she can and cannot eat based on conflicting education provided at her discharge.  Recommended a high-fiber diet including fiber supplement daily.  We will print out educational information for her.  Follow-up in 4 months.  Call for any worsening or recurrent symptoms.

## 2018-04-23 NOTE — Assessment & Plan Note (Signed)
The patient has complaints of constipation.  She will at times go a week with no bowel movement.  She does not feel her stools are hard but they are coming out" pellets" and require straining.  She overall feels constipated.  She does not drink adequate water.  Recommended she increase her water intake.  Add a fiber supplement of choice 1-2 times a day, based on instructions.  Increase fiber in her diet.  If persistent constipation despite these measures she can add Colace stool softener 1-2 times daily.  Follow-up in 4 months.  Call for any questions or concerns.

## 2018-04-29 ENCOUNTER — Other Ambulatory Visit: Payer: Self-pay

## 2018-04-29 MED ORDER — PANTOPRAZOLE SODIUM 40 MG PO TBEC: 40.0000 mg | DELAYED_RELEASE_TABLET | Freq: Two times a day (BID) | ORAL | 3 refills | Status: DC

## 2018-04-29 MED ORDER — ROSUVASTATIN CALCIUM 40 MG PO TABS: ORAL_TABLET | ORAL | 0 refills | Status: DC

## 2018-04-29 MED ORDER — GABAPENTIN 400 MG PO CAPS: ORAL_CAPSULE | ORAL | 0 refills | Status: DC

## 2018-04-29 NOTE — Telephone Encounter (Signed)
Ok times 3 mo

## 2018-05-01 ENCOUNTER — Other Ambulatory Visit: Payer: Self-pay | Admitting: *Deleted

## 2018-05-01 MED ORDER — FLUTICASONE PROPIONATE 50 MCG/ACT NA SUSP: 2.0000 | Freq: Every day | NASAL | 1 refills | Status: DC

## 2018-05-03 ENCOUNTER — Other Ambulatory Visit: Payer: Self-pay | Admitting: Family Medicine

## 2018-05-03 NOTE — Telephone Encounter (Signed)
Left message to return call 

## 2018-05-06 NOTE — Telephone Encounter (Signed)
Pt is going through mail order pharmacy. Pt states that provider told her to stop the hydrochlorothiazide

## 2018-05-07 ENCOUNTER — Telehealth: Payer: Self-pay

## 2018-05-07 ENCOUNTER — Ambulatory Visit: Payer: Medicare Other | Admitting: Emergency Medicine

## 2018-05-07 NOTE — Telephone Encounter (Signed)
New Rx request from [facility] [phone], fax [phone] for Pantoprazole 40 mg 90 day supply.

## 2018-05-08 MED ORDER — PANTOPRAZOLE SODIUM 40 MG PO TBEC: 40.0000 mg | DELAYED_RELEASE_TABLET | Freq: Two times a day (BID) | ORAL | 3 refills | Status: AC

## 2018-05-08 NOTE — Telephone Encounter (Signed)
rx done

## 2018-05-08 NOTE — Addendum Note (Signed)
Addended by: Mahala Menghini on: 05/08/2018 12:51 PM   Modules accepted: Orders

## 2018-05-09 ENCOUNTER — Telehealth: Payer: Self-pay | Admitting: Family Medicine

## 2018-05-09 ENCOUNTER — Other Ambulatory Visit: Payer: Self-pay | Admitting: *Deleted

## 2018-05-09 MED ORDER — FLUCONAZOLE 150 MG PO TABS: ORAL_TABLET | ORAL | 0 refills | Status: DC

## 2018-05-09 NOTE — Telephone Encounter (Signed)
Pt would like something called in for a yeast infection. She is having vaginal itching. Symptom started two days ago.   [id number] - [place],  - [place]

## 2018-05-09 NOTE — Telephone Encounter (Signed)
Left message to return call 

## 2018-05-09 NOTE — Telephone Encounter (Signed)
Pt having white discharge and vaginal itching. Diflucan 150mg  #2 one po 3 days apart per dr Richardson Landry. Med sent to pharm. Pt notified.

## 2018-05-23 ENCOUNTER — Other Ambulatory Visit: Payer: Self-pay | Admitting: Cardiovascular Disease

## 2018-05-23 MED ORDER — CLOPIDOGREL BISULFATE 75 MG PO TABS: 75.0000 mg | ORAL_TABLET | Freq: Every day | ORAL | 1 refills | Status: DC

## 2018-05-23 NOTE — Telephone Encounter (Signed)
Clopidogrel 75 mg refilled. 

## 2018-05-23 NOTE — Telephone Encounter (Signed)
New Message    *STAT* If patient is at the pharmacy, call can be transferred to refill team.   1. Which medications need to be refilled? (please list name of each medication and dose if known) Clopidogrel 75 mg  2. Which pharmacy/location (including street and city if local pharmacy) is medication to be sent to? [facility]  3. Do they need a 30 day or 90 day supply? 90 day supply

## 2018-05-28 ENCOUNTER — Telehealth: Payer: Self-pay | Admitting: Internal Medicine

## 2018-05-28 NOTE — Telephone Encounter (Signed)
Have pt scheduled for Doxy visit tomorrow morning so will send refills at that time because she stated that she uses the [facility] 14 day and will also need sensors sent

## 2018-05-28 NOTE — Telephone Encounter (Signed)
Pt has not been seen since Dec.2019 also the requested refills are not on her med list, please advise

## 2018-05-28 NOTE — Telephone Encounter (Signed)
MEDICATION: Contour 14 day Sensor  Contour next strips  PHARMACY:  [id number] - [place], [place]   IS THIS A 90 DAY SUPPLY :   IS PATIENT OUT OF MEDICATION: Yes, both  IF NOT; HOW MUCH IS LEFT:   LAST APPOINTMENT DATE: @12 /30/2019  NEXT APPOINTMENT DATE:@Visit  date not found  DO WE HAVE YOUR PERMISSION TO LEAVE A DETAILED MESSAGE:  OTHER COMMENTS:  Patient had one sensor left in her 90 day supply and states when she attached it she wouldn't stop bleeding and had to remove it.  **Let patient know to contact pharmacy at the end of the day to make sure medication is ready. **  ** Please notify patient to allow 48-72 hours to process**  **Encourage patient to contact the pharmacy for refills or they can request refills through [facility]**

## 2018-05-29 ENCOUNTER — Telehealth: Payer: Self-pay | Admitting: Internal Medicine

## 2018-05-29 ENCOUNTER — Encounter: Payer: Self-pay | Admitting: Internal Medicine

## 2018-05-29 ENCOUNTER — Other Ambulatory Visit: Payer: Self-pay

## 2018-05-29 ENCOUNTER — Ambulatory Visit (INDEPENDENT_AMBULATORY_CARE_PROVIDER_SITE_OTHER): Payer: Medicare HMO | Admitting: Internal Medicine

## 2018-05-29 DIAGNOSIS — E785 Hyperlipidemia, unspecified: Secondary | ICD-10-CM | POA: Insufficient documentation

## 2018-05-29 DIAGNOSIS — E1142 Type 2 diabetes mellitus with diabetic polyneuropathy: Secondary | ICD-10-CM

## 2018-05-29 DIAGNOSIS — Z794 Long term (current) use of insulin: Secondary | ICD-10-CM

## 2018-05-29 DIAGNOSIS — E1159 Type 2 diabetes mellitus with other circulatory complications: Secondary | ICD-10-CM | POA: Diagnosis not present

## 2018-05-29 MED ORDER — INSULIN ASPART 100 UNIT/ML FLEXPEN: 4.0000 [IU] | PEN_INJECTOR | Freq: Three times a day (TID) | SUBCUTANEOUS | 3 refills | Status: DC

## 2018-05-29 MED ORDER — INSULIN GLARGINE 100 UNIT/ML SOLOSTAR PEN: 22.0000 [IU] | PEN_INJECTOR | Freq: Every day | SUBCUTANEOUS | 6 refills | Status: DC

## 2018-05-29 MED ORDER — SEMAGLUTIDE(0.25 OR 0.5MG/DOS) 2 MG/1.5ML ~~LOC~~ SOPN: 0.5000 mg | PEN_INJECTOR | SUBCUTANEOUS | 11 refills | Status: AC

## 2018-05-29 NOTE — Telephone Encounter (Signed)
MEDICATION: [facility] [employer] Libre 14 day and sensors  [facility]Y:  [id number] [place] [address]   IS THIS A 90 DAY SUPPLY : unk  IS PATIENT OUT OF MEDICATION:   IF NOT; HOW MUCH IS LEFT:   LAST APPOINTMENT DATE: @4 /22/2020  NEXT APPOINTMENT DATE:@Visit  date not found  DO WE HAVE YOUR PERMISSION TO LEAVE A DETAILED MESSAGE:  OTHER COMMENTS:    **Let patient know to contact pharmacy at the end of the day to make sure medication is ready. **  ** Please notify patient to allow 48-72 hours to process**  **Encourage patient to contact the pharmacy for refills or they can request refills through [facility]**

## 2018-05-29 NOTE — Progress Notes (Signed)
Virtual Visit via Video Note  I connected with Rachel [name] [name] on 05/29/18 at 10:10 AM EDT by a video enabled telemedicine application and verified that I am speaking with the correct person using two identifiers.   I discussed the limitations of evaluation and management by telemedicine and the availability of in person appointments. The patient expressed understanding and agreed to proceed.   -Location of the patient : Home  -Location of the provider : Office -The names of all persons participating in the telemedicine service : Pt and myself        Name: Rachel [name] [name]   Age/ Sex: 68 y.o., female    MRN/ DOB: [id number] , Nov 20, 1950    PCP: Mikey Kirschner, MD    Reason for Endocrinology Evaluation: Type 2 Diabetes Mellitus     Initial Endocrinology Clinic Visit: 12/18/2017    PATIENT IDENTIFIER: Rachel [name] [name] is a 68 y.o. female with a past medical history of Hypothyroidism, DM, CAD (S/P PCI), PVD, COPD and Dyslipidemia  . The patient has followed with Endocrinology clinic since 12/18/2017 for consultative assistance with management of her diabetes.  DIABETIC HISTORY:  Rachel [name] [name] was diagnosed with T2DM for over 20 yrs. She was initially on metformin, but has been on insulin therapy for years. Her hemoglobin A1c has ranged from 6.0%in 2016, peaking at12.3%in2017.  On her initial presentation her A1c was 8.0 % ,she was off metformin and just on an MDI regimen as she was just discharged from the hospital for dehydration where metformin was held.   We restarted Metformin in 11,2019 and ozempic started 12,2019.  SUBJECTIVE:   During the last visit (02/04/2018): We started Ozempic 0.25 mg weekly, and continued metformin with reduction in Lantus and Novolog dose.   Today (05/29/2018): Rachel [name] [name] is here for a virtual visit on diabetes management. She has missed her 8 week follow up appointment in February, 2020.  She checks her blood sugars 3 times daily,  preprandial . The patient has had hypoglycemic episodes since the last clinic visit, which typically occur 1x / month  - most often occuring during the day . The patient is symptomatic with these episodes, with symptoms of shaky and nervous . Otherwise, the patient has not required any recent emergency interventions for hypoglycemia and has not had recent hospitalizations secondary to hyper or hypoglycemic episodes.    She has been avoiding sugar sweetened beverages , she eats 3 meals a day  She has been skipping Lantus if her bedtime sugar is below 120, due to fear of hypoglycemia.   ROS: As per HPI and as detailed below: Review of Systems  Constitutional: Negative for fever.  HENT: Negative for congestion and sore throat.   Respiratory: Negative for cough and shortness of breath.   Gastrointestinal: Negative for diarrhea and nausea.      HOME DIABETES REGIMEN:  Metformin 500 mg XR 1 Tabs BID Ozempic 0.25 mg weekly  Lantus 28 units daily - skips if Bg < 120 Novolog 7- 9  units TID QAC    GLUCOSE LOG: Date  Breakfast  Dinner Bedtime  05/27/18 179 120   05/26/18 190 70   4/18 151 111   4/17 146 166   4/16 147    4/15  134 120         DIABETIC COMPLICATIONS: Microvascular complications:   Neuropathy  Denies: retinopothy   Last eye exam: Completed > 3 yrs ago   Macrovascular complications:   PVD, CAD  Denies: CVA    HISTORY:  Past Medical History:  Past Medical History:  Diagnosis Date  . Asthma   . Coronary atherosclerosis of native coronary artery    DES x 2 (overlapping) RCA 03/2008 - patent 2011  . Diabetes type 2, controlled ([place]) 2000  . Essential hypertension, benign   . Fatty liver   . GERD (gastroesophageal reflux disease)   . Hyperlipidemia   . Microalbuminuria   . Obesity   . Odynophagia    Severe esophagitis on CT 05/2010, treated empirically with Diflucan  . PAD (peripheral artery disease) ([facility])    history of left greater than right  lower 70 claudication  . Tubular adenoma of colon 07/2007   Multiple colonic polyps  . Venous stasis     Past Surgical History:  Past Surgical History:  Procedure Laterality Date  . BIOPSY  02/14/2018   Procedure: BIOPSY;  Surgeon: Daneil Dolin, MD;  [facility];  Service: Endoscopy;;  gastric bx's  . CARDIAC CATHETERIZATION     stents  . CHOLECYSTECTOMY N/A 07/30/2015   Procedure: LAPAROSCOPIC CHOLECYSTECTOMY;  Surgeon: Aviva Signs, MD;  Location: [facility];  Service: General;  Laterality: N/A;  . COLONOSCOPY W/ POLYPECTOMY  07/2007   Tubular adenoma  . COLONOSCOPY WITH PROPOFOL N/A 02/14/2018   Dr. Gala Romney: diverticulosis in sigmoid and descending colon, three 4-6 mm polyps in descending colon and ascending, tubular adenomas  . CORONARY STENT PLACEMENT    . CYSTECTOMY     Pilonidal  . ESOPHAGOGASTRODUODENOSCOPY  04/2010   RMR: Normal esophagus , Stomach D1 and D2   . ESOPHAGOGASTRODUODENOSCOPY (EGD) WITH PROPOFOL N/A 02/14/2018   Dr. Gala Romney: mild erosive reflux esophagitis, s/p dilatation, erosive gastropathy s/p biopsy, chronic inactive gastritis, negative H.pylori  . LOWER EXTREMITY ANGIOGRAPHY N/A 03/13/2016   Procedure: Lower Extremity Angiography;  Surgeon: [name], MD;  Location: [facility];  Service: Cardiovascular;  Laterality: N/A;  . Venia Minks DILATION N/A 02/14/2018   Procedure: Venia Minks DILATION;  Surgeon: Daneil Dolin, MD;  [facility];  Service: Endoscopy;  Laterality: N/A;  . PERIPHERAL VASCULAR ATHERECTOMY  03/27/2016   Procedure: Peripheral Vascular Atherectomy;  Surgeon: [name], MD;  Location: [facility];  Service: Cardiovascular;;  Attempted atherectomy  . PERIPHERAL VASCULAR CATHETERIZATION Bilateral 09/28/2014   Procedure: Lower Extremity Angiography;  Surgeon: [name], MD;  Location: [facility];  Service: Cardiovascular;  Laterality: Bilateral;  . PERIPHERAL VASCULAR CATHETERIZATION N/A 09/28/2014    Procedure: Abdominal Aortogram;  Surgeon: [name], MD;  Location: [facility];  Service: Cardiovascular;  Laterality: N/A;  . PERIPHERAL VASCULAR CATHETERIZATION N/A 10/15/2014   Procedure: Lower Extremity Angiography;  Surgeon: [name], MD;  Location: [facility];  Service: Cardiovascular;  Laterality: N/A;  . PERIPHERAL VASCULAR CATHETERIZATION  10/15/2014   Procedure: Peripheral Vascular Intervention;  Surgeon: [name], MD;  Location: [facility];  Service: Cardiovascular;;  bilateral iliac stents  . PILONIDAL CYST / SINUS EXCISION    . POLYPECTOMY  02/14/2018   Procedure: POLYPECTOMY;  Surgeon: Daneil Dolin, MD;  [facility];  Service: Endoscopy;;  ascending colon polyps x2, descending polyps x3  . TUBAL LIGATION Bilateral      Social History:  reports that she has been smoking cigarettes. She has a 12.50 pack-year smoking history. She has never used smokeless tobacco. She reports that she does not drink alcohol or use  drugs. Family History:  Family History  Problem Relation Age of Onset  . Bone cancer Father   . Heart disease Maternal Grandmother   . Hypertension Mother   . Diabetes Mother   . Colon cancer Neg Hx   . Liver disease Neg Hx   . Inflammatory bowel disease Neg Hx       HOME MEDICATIONS: Allergies as of 05/29/2018      Reactions   Altace [ramipril] Cough   Biaxin [clarithromycin] Other (See Comments)   Fatigue   Niacin And Related Other (See Comments)   Flush - felt like on fire.   Nsaids Other (See Comments)   feverish   Tramadol Other (See Comments)   Lethargic    Adhesive [tape] Rash, Other (See Comments)   Paper tape is ok      Medication List       Accurate as of May 29, 2018  7:51 AM. Always use your most recent med list.        albuterol 108 (90 Base) MCG/ACT inhaler Commonly known as:  ProAir HFA Inhale 2 puffs into the lungs every 4 (four) hours as needed for wheezing or shortness of  breath.   aspirin EC 81 MG tablet Take 81 mg by mouth daily.   clopidogrel 75 MG tablet Commonly known as:  PLAVIX Take 1 tablet (75 mg total) by mouth daily.   fluconazole 150 MG tablet Commonly known as:  DIFLUCAN Take one tablet 3 days apart   fluticasone 50 MCG/ACT nasal spray Commonly known as:  FLONASE Place 2 sprays into both nostrils daily.   gabapentin 400 MG capsule Commonly known as:  NEURONTIN TAKE ONE CAPSULE BY MOUTH AT BEDTIME AS DIRECTED   insulin aspart 100 UNIT/ML FlexPen Commonly known as:  NovoLOG FlexPen Inject 7 Units into the skin 3 (three) times daily with meals.   Insulin Glargine 100 UNIT/ML Solostar Pen Commonly known as:  Lantus SoloStar Inject 28 Units into the skin daily.   levothyroxine 100 MCG tablet Commonly known as:  SYNTHROID TAKE 1 TABLET BY MOUTH EVERY DAY BEFORE BREAKFAST   meclizine 25 MG tablet Commonly known as:  ANTIVERT Take 1 tablet (25 mg total) by mouth 3 (three) times daily as needed for dizziness.   metFORMIN 500 MG 24 hr tablet Commonly known as:  Glucophage XR Take 1 tablet (500 mg total) by mouth 2 (two) times daily with a meal.   pantoprazole 40 MG tablet Commonly known as:  PROTONIX Take 1 tablet (40 mg total) by mouth 2 (two) times daily before a meal.   potassium chloride SA 20 MEQ tablet Commonly known as:  K-DUR Take 1 tablet (20 mEq total) by mouth 2 (two) times daily.   rosuvastatin 40 MG tablet Commonly known as:  CRESTOR TAKE 1 TABLET EVERY DAY AT BEDTIME   Semaglutide(0.25 or 0.5MG /DOS) 2 MG/1.5ML Sopn Commonly known as:  Ozempic (0.25 or 0.5 MG/DOSE) Inject 0.25 mg into the skin once a week.   tiotropium 18 MCG inhalation capsule Commonly known as:  SPIRIVA Place 1 capsule (18 mcg total) into inhaler and inhale daily.         DATA REVIEWED:  Lab Results  Component Value Date   HGBA1C 8.0 (H) 11/13/2017   HGBA1C 7.6 (H) 05/10/2017   HGBA1C 8.8 (H) 02/08/2017   Lab Results   Component Value Date   MICROALBUR 1.4 02/08/2017   LDLCALC 61 12/07/2017   CREATININE 0.97 04/05/2018   Lab Results  Component Value  Date   MICRALBCREAT 35 (H) 02/08/2017     Lab Results  Component Value Date   CHOL 132 12/07/2017   HDL 52 12/07/2017   LDLCALC 61 12/07/2017   TRIG 93 12/07/2017   CHOLHDL 2.5 12/07/2017         ASSESSMENT / PLAN / RECOMMENDATIONS:   1) Type 2 Diabetes Mellitus, Sub-Optimally controlled, With neuropathic and macrovascular complications - Most recent A1c of 8.0 %. Goal A1c < 7.5 %.    Plan:  - She is tolerating Ozempic without side effects. Her BG's have been tight with occasional hypoglycemia.  - I have praised the patient on lifestyle changes and compliance with medication intake.  - She has freestyle Elenor Legato  that she usually uses but the last sensor caused bleeding and she had to throw it away. Pt is short on sensors for 2 weeks. I have encouraged her to  Contact the help line # for the manufacturer - Will adjust her medications as below , I have encouraged her that if she continues to do well, we will be able to take her off prandial insulin.  - Will increase Ozempic at this time and by next visit, will increase Metformin as she is not maxed out on this yet.     MEDICATIONS:  Decrease Lantus to 22 units   Decrease Novolog 4 units TID QAC  Continue Metformin 500 mg XR 1 tablet BID   Increase Ozempic to 0.5 mg weekly   EDUCATION / INSTRUCTIONS:  BG monitoring instructions: Patient is instructed to check her blood sugars 4 times a day, before meals and bedtime.  Call [facility] if: BG persistently < 70 or > 300.  I reviewed the Rule of 15 for the treatment of hypoglycemia in detail with the patient.   2) Diabetic complications:   Eye: Unknown to have known diabetic retinopathy. She was encouraged to schedule and appointment as soon as this pandemic slows down, as it has been over 3 yrs since the last time she  had them checked.  Neuro/ Feet: Does have known diabetic peripheral neuropathy .   Renal: Patient does not  have known baseline CKD. She   is not on an ACEI/ARB at present.   3) Lipids: Patient is on a statin. Last LDL 61 mg/dL     I discussed the assessment and treatment plan with the patient. The patient was provided an opportunity to ask questions and all were answered. The patient agreed with the plan and demonstrated an understanding of the instructions.   The patient was advised to call back or seek an in-person evaluation if the symptoms worsen or if the condition fails to improve as anticipated.    F/U in 2 months    Signed electronically by: Mack Guise, MD  [facility]  [facility] [address] [place], [place] [address] Phone: [phone] FAX: [phone]   CC: Mikey Kirschner, [place] [address] Phone: [phone]  Fax: [phone]  Return to Endocrinology clinic as below: Future Appointments  Date Time Provider [place]  05/29/2018 10:10 AM , Melanie Crazier, MD [facility] None  08/27/2018 11:00 AM Carlis Stable, NP [facility]  09/03/2018 11:00 AM Collene Gobble, MD [facility] None

## 2018-05-30 ENCOUNTER — Other Ambulatory Visit: Payer: Self-pay | Admitting: Internal Medicine

## 2018-05-30 MED ORDER — FREESTYLE LIBRE 14 DAY SENSOR MISC: 1.0000 | 12 refills | Status: DC

## 2018-05-30 NOTE — Telephone Encounter (Signed)
Did pt mention that was using a libre during her visit yesterday? I do not see anything in her med list for a libre.

## 2018-05-31 NOTE — Telephone Encounter (Signed)
Called pt and left a message to advise that Dr. Kelton Pillar had filled this to Pts pharmacy yesterday.

## 2018-06-19 ENCOUNTER — Telehealth: Payer: Self-pay | Admitting: Cardiovascular Disease

## 2018-06-20 ENCOUNTER — Telehealth (INDEPENDENT_AMBULATORY_CARE_PROVIDER_SITE_OTHER): Payer: Medicare HMO | Admitting: Cardiovascular Disease

## 2018-06-20 VITALS — Ht 63.0 in | Wt 148.0 lb

## 2018-06-20 DIAGNOSIS — I739 Peripheral vascular disease, unspecified: Secondary | ICD-10-CM | POA: Diagnosis not present

## 2018-06-20 NOTE — Patient Instructions (Signed)
Medication Instructions:   Your physician recommends that you continue on your current medications as directed. Please refer to the Current Medication list given to you today.  Labwork:  NONE  Testing/Procedures:  Your physician has requested that you have a lower extremity arterial exercise duplex in October 2020. During this test, exercise and ultrasound are used to evaluate arterial blood flow in the legs. Allow one hour for this exam. There are no restrictions or special instructions. Your physician has requested that you have an ankle brachial index (ABI) in October 2020. During this test an ultrasound and blood pressure cuff are used to evaluate the arteries that supply the arms and legs with blood. Allow thirty minutes for this exam. There are no restrictions or special instructions.   Follow-Up:  Your physician recommends that you schedule a follow-up appointment in: 1 year. You will receive a reminder letter in the mail in about 10 months reminding you to call and schedule your appointment. If you don't receive this letter, please contact our office.  Any Other Special Instructions Will Be Listed Below (If Applicable).  If you need a refill on your cardiac medications before your next appointment, please call your pharmacy.

## 2018-06-20 NOTE — Progress Notes (Signed)
Virtual Visit via Video Note   This visit type was conducted due to national recommendations for restrictions regarding the COVID-19 Pandemic (e.g. social distancing) in an effort to limit this patient's exposure and mitigate transmission in our community.  Due to her co-morbid illnesses, this patient is at least at moderate risk for complications without adequate follow up.  This format is felt to be most appropriate for this patient at this time.  All issues noted in this document were discussed and addressed.  A limited physical exam was performed with this format.  Please refer to the patient's chart for her consent to telehealth for [facility].   Date:  06/20/2018   ID:  Rachel [name], DOB [birth year], MRN [id number]  Patient Location: Home Provider Location: Home  PCP:  Mikey Kirschner, MD  Cardiologist:  Quay Burow, MD  Electrophysiologist:  None   Evaluation Performed:  Follow-Up Visit  Chief Complaint: Peripheral arterial disease  History of Present Illness:    Rachel [name] is a 68 y.o.  mildly overweight divorced African-American female mother of one, grandmother of one grandchild who is referred by Dr. [name] for lifestyle limiting claudication. I last saw her in the in the office 06/20/2017.  Her primary care physician is Dr. [name]. She has a history of tobacco abuse smoking one pack per day for last 45 years, treated hypertension, diabetes and hyperlipidemia. She does have ischemic heart disease status post RCA intervention February 2010 with overlapping drug eluting stents in her RCA. She had repeat cath the following year revealing these to be widely patent. She complains of right lower extremity claudication claudication left greater than right over the last 2 years. 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 I reviewed this with Dr. Trula Slade who suggested that we attempt percutaneous vascular station before considering aortobifemoral bypass grafting. I angiogram on 10/15/14 and was able to cross her left common iliac chronic total occlusion. I stented both iliac arteries with I cast cover stents using "kissing stent technique". Her procedure was stopped. By a significant hematoma in the left side with a left common femoral pseudoaneurysm that was partially thrombosed initially and follow-up Doppler was completely thrombosed. Her hemoglobin stabilized not requiring transfusion. Her claudication has resolved although she still has some pain in her left hip from resolving hematoma. Her Doppler studies showed post procedure normal ABIs bilaterally and her claudication resolved.  She had a repeat procedure because of a drop in her ABI and recurrent claudication 03/13/2016 revealing patent iliac stent stents with moderate calcification in the left common femoral artery and high-grade 95% calcification in the mid left SFA.  I was unable to cross the iliac bifurcation because of the previously placed stents.  I brought her back on 03/27/2016 and because of the dense calcification was unable to get antegrade access on the left with the intent to perform atherectomy and drug-coated balloon angioplasty and the procedure was aborted.  Since I saw her back a year ago she is remained stable.  She does have mild lifestyle limiting claudication.  She is continues to smoke some.  She denies chest pain or shortness of breath.  Dopplers performed 11/08/2017 revealed a right ABI 0.96 and a left ABI  0.69.  The patient does not have symptoms concerning for COVID-19 infection (fever, chills, cough, or new shortness of breath).    Past Medical History:  Diagnosis Date   Asthma    Coronary  atherosclerosis of native coronary artery    DES x 2 (overlapping) RCA 03/2008 - patent 2011   Diabetes type 2, controlled ([place]) 2000   Essential hypertension, benign    Fatty liver    GERD (gastroesophageal reflux disease)    Hyperlipidemia    Microalbuminuria    Obesity    Odynophagia    Severe esophagitis on CT 05/2010, treated empirically with Diflucan   PAD (peripheral artery disease) ([place])    history of left greater than right lower 70 claudication   Tubular adenoma of colon 07/2007   Multiple colonic polyps   Venous stasis    Past Surgical History:  Procedure Laterality Date   BIOPSY  02/14/2018   Procedure: BIOPSY;  Surgeon: Daneil Dolin, MD;  [facility];  Service: Endoscopy;;  gastric bx's   CARDIAC CATHETERIZATION     stents   CHOLECYSTECTOMY N/A 07/30/2015   Procedure: LAPAROSCOPIC CHOLECYSTECTOMY;  Surgeon: Aviva Signs, MD;  Location: [facility];  Service: General;  Laterality: N/A;   COLONOSCOPY W/ POLYPECTOMY  07/2007   Tubular adenoma   COLONOSCOPY WITH PROPOFOL N/A 02/14/2018   Dr. Gala Romney: diverticulosis in sigmoid and descending colon, three 4-6 mm polyps in descending colon and ascending, tubular adenomas   CORONARY STENT PLACEMENT     CYSTECTOMY     Pilonidal   ESOPHAGOGASTRODUODENOSCOPY  04/2010   RMR: Normal esophagus , Stomach D1 and D2    ESOPHAGOGASTRODUODENOSCOPY (EGD) WITH PROPOFOL N/A 02/14/2018   Dr. Gala Romney: mild erosive reflux esophagitis, s/p dilatation, erosive gastropathy s/p biopsy, chronic inactive gastritis, negative H.pylori   LOWER EXTREMITY ANGIOGRAPHY N/A 03/13/2016   Procedure: Lower Extremity Angiography;  Surgeon: [name], MD;  Location: [place] CV LAB;  Service: Cardiovascular;  Laterality: N/A;   MALONEY DILATION N/A 02/14/2018   Procedure: Venia Minks DILATION;  Surgeon: Daneil Dolin, MD;  [facility];  Service: Endoscopy;  Laterality: N/A;   PERIPHERAL VASCULAR ATHERECTOMY  03/27/2016    Procedure: Peripheral Vascular Atherectomy;  Surgeon: [name], MD;  Location: [place] [facility];  Service: Cardiovascular;;  Attempted atherectomy   PERIPHERAL VASCULAR CATHETERIZATION Bilateral 09/28/2014   Procedure: Lower Extremity Angiography;  Surgeon: [name], MD;  Location: [place] [facility];  Service: Cardiovascular;  Laterality: Bilateral;   PERIPHERAL VASCULAR CATHETERIZATION N/A 09/28/2014   Procedure: Abdominal Aortogram;  Surgeon: [name], MD;  Location: [place] [facility] LAB;  Service: Cardiovascular;  Laterality: N/A;   PERIPHERAL VASCULAR CATHETERIZATION N/A 10/15/2014   Procedure: Lower Extremity Angiography;  Surgeon: [name], MD;  Location: [place] CV LAB;  Service: Cardiovascular;  Laterality: N/A;   PERIPHERAL VASCULAR CATHETERIZATION  10/15/2014   Procedure: Peripheral Vascular Intervention;  Surgeon: [name], MD;  Location: [place] [facility] LAB;  Service: Cardiovascular;;  bilateral iliac stents   PILONIDAL CYST / SINUS EXCISION     POLYPECTOMY  02/14/2018   Procedure: POLYPECTOMY;  Surgeon: Daneil Dolin, MD;  [facility];  Service: Endoscopy;;  ascending colon polyps x2, descending polyps x3   TUBAL LIGATION Bilateral      No outpatient medications have been marked as taking for the 06/20/18 encounter (Appointment) with [name], MD.     Allergies:   Altace [  ramipril]; Biaxin [clarithromycin]; Niacin and related; Nsaids; Tramadol; and Adhesive [tape]   Social History   Tobacco Use   Smoking status: Current Every Day Smoker    Packs/day: 0.25    Years: 50.00    Pack years: 12.50    Types: Cigarettes   Smokeless tobacco: Never Used  Substance Use Topics   Alcohol use: No   Drug use: No     Family Hx: The patient's family history includes Bone cancer in her father; Diabetes in her mother; Heart disease in her maternal grandmother; Hypertension in her mother. There is no history of Colon  cancer, Liver disease, or Inflammatory bowel disease.  ROS:   Please see the history of present illness.     All other systems reviewed and are negative.   Prior CV studies:   The following studies were reviewed today:  Lower extremity arterial Doppler studies performed 11/08/2017  Labs/Other Tests and Data Reviewed:    EKG:  No ECG reviewed.  Recent Labs: 11/13/2017: TSH 0.298 03/10/2018: ALT 13 03/12/2018: Magnesium 2.4 04/05/2018: BUN 9; Creatinine, Ser 0.97; Hemoglobin 12.5; Platelets 309; Potassium 3.6; Sodium 141   Recent Lipid Panel Lab Results  Component Value Date/Time   CHOL 132 12/07/2017 10:15 AM   TRIG 93 12/07/2017 10:15 AM   HDL 52 12/07/2017 10:15 AM   CHOLHDL 2.5 12/07/2017 10:15 AM   CHOLHDL 3.6 04/18/2014 08:33 AM   LDLCALC 61 12/07/2017 10:15 AM    Wt Readings from Last 3 Encounters:  04/23/18 155 lb 6.4 oz (70.5 kg)  03/21/18 151 lb (68.5 kg)  03/10/18 154 lb 1.6 oz (69.9 kg)     Objective:    Vital Signs:  There were no vitals taken for this visit.   VITAL SIGNS:  reviewed GEN:  no acute distress RESPIRATORY:  normal respiratory effort, symmetric expansion NEURO:  alert and oriented x 3, no obvious focal deficit PSYCH:  normal affect  ASSESSMENT & PLAN:    1. Peripheral arterial disease- history of PAD status post left common iliac CTO PTA and stenting using "kissing stent technique of both iliac arteries with resultant improvement in her claudication.  Because of recurrent symptoms she was re-angiogram and 03/13/2016 revealing patent iliac stents with 60% calcified left common femoral artery stenosis and 95% mid left SFA stenosis.  I was unable to cross the iliac bifurcation because of the previously placed stents and brought her back on 03/27/2016 with the intention to perform an antegrade stick on the left and atherectomized her calcified segment.  Unfortunately, because of the dense calcification I was unable to access her left SFA antegrade and  aborted the procedure.  Her most recent Doppler performed 11/08/2017 revealed a right ABI 0.96 and a left of 0.69.  She does complain of mild left lower extremity claudication but this is not significantly lifestyle limiting.  She remains on DAPT.  COVID-19 Education: The signs and symptoms of COVID-19 were discussed with the patient and how to seek care for testing (follow up with PCP or arrange E-visit).  The importance of social distancing was discussed today.  Time:   Today, I have spent 9 minutes with the patient with telehealth technology discussing the above problems.     Medication Adjustments/Labs and Tests Ordered: Current medicines are reviewed at length with the patient today.  Concerns regarding medicines are outlined above.   Tests Ordered: No orders of the defined types were placed in this encounter.   Medication Changes: No orders of the defined  types were placed in this encounter.   Disposition:  Follow up in 1 year(s)  Signed, Quay Burow, MD  06/20/2018 12:07 PM    [place]

## 2018-06-24 ENCOUNTER — Other Ambulatory Visit: Payer: Self-pay | Admitting: Family Medicine

## 2018-07-16 ENCOUNTER — Telehealth: Payer: Self-pay | Admitting: Internal Medicine

## 2018-07-16 NOTE — Telephone Encounter (Signed)
Patient states they are taking Novolog and would like to know if they could take Novolin 70-30, it is $25 at [employer].  Please Advise, Thanks

## 2018-07-16 NOTE — Telephone Encounter (Signed)
please advise.

## 2018-07-16 NOTE — Telephone Encounter (Signed)
Pt stated that she has a Doxy visit for 07/29/2018 and that she would like to discuss this at that time

## 2018-07-20 ENCOUNTER — Other Ambulatory Visit: Payer: Self-pay | Admitting: Family Medicine

## 2018-07-29 ENCOUNTER — Other Ambulatory Visit: Payer: Self-pay

## 2018-07-29 ENCOUNTER — Ambulatory Visit (INDEPENDENT_AMBULATORY_CARE_PROVIDER_SITE_OTHER): Payer: Medicare HMO | Admitting: Internal Medicine

## 2018-07-29 ENCOUNTER — Encounter: Payer: Self-pay | Admitting: Internal Medicine

## 2018-07-29 VITALS — BP 138/76 | HR 86 | Temp 97.1°F | Ht 63.0 in | Wt 168.0 lb

## 2018-07-29 DIAGNOSIS — E1142 Type 2 diabetes mellitus with diabetic polyneuropathy: Secondary | ICD-10-CM | POA: Diagnosis not present

## 2018-07-29 DIAGNOSIS — E1159 Type 2 diabetes mellitus with other circulatory complications: Secondary | ICD-10-CM

## 2018-07-29 DIAGNOSIS — Z794 Long term (current) use of insulin: Secondary | ICD-10-CM | POA: Diagnosis not present

## 2018-07-29 MED ORDER — LANTUS SOLOSTAR 100 UNIT/ML ~~LOC~~ SOPN: 24.0000 [IU] | PEN_INJECTOR | Freq: Every day | SUBCUTANEOUS | 6 refills | Status: AC

## 2018-07-29 MED ORDER — METFORMIN HCL 1000 MG PO TABS: 1000.0000 mg | ORAL_TABLET | Freq: Every day | ORAL | 3 refills | Status: DC

## 2018-07-29 NOTE — Progress Notes (Signed)
Virtual Visit via Video Note  I connected with Rachel [name] on 07/29/18 at 10:10 AM EDT by a video enabled telemedicine application and verified that I am speaking with the correct person using two identifiers.   I discussed the limitations of evaluation and management by telemedicine and the availability of in person appointments. The patient expressed understanding and agreed to proceed.   -Location of the patient : Home  -Location of the provider : Office -The names of all persons participating in the telemedicine service : Pt and myself        Name: Rachel [name]   Age/ Sex: 68 y.o., female    MRN/ DOB: [id number] , [birth year]    PCP: Mikey Kirschner, MD    Reason for Endocrinology Evaluation: Type 2 Diabetes Mellitus     Initial Endocrinology Clinic Visit: 12/18/2017    PATIENT IDENTIFIER: Rachel [name] is a 68 y.o. female with a past medical history of Hypothyroidism, DM, CAD (S/P PCI), PVD, COPD and Dyslipidemia  . The patient has followed with Endocrinology clinic since 12/18/2017 for consultative assistance with management of her diabetes.  DIABETIC HISTORY:  Rachel [name] was diagnosed with T2DM for over 20 yrs. She was initially on metformin, but has been on insulin therapy for years. Her hemoglobin A1c has ranged from 6.0%in 2016, peaking at12.3%in2017.  On her initial presentation her A1c was 8.0 % ,she was off metformin and just on an MDI regimen as she was just discharged from the hospital for dehydration where metformin was held.   We restarted Metformin in 11,2019 and ozempic started 12,2019.  SUBJECTIVE:   During the last visit (05/29/2018): We decreased Lantus to 22 units, novolog to 4 units, we continued metformin and increased ozempic to 0.5 mg weekly.   Today (07/29/2018): Rachel [name] is here for a virtual 2- month visit on diabetes management.  She checks her blood sugars 3 times daily, preprandial . The patient has had hypoglycemic  episodes since the last clinic visit, which typically occur 1x / month  - most often occuring during the day . The patient is symptomatic with these episodes, with symptoms of shaky and nervous . Otherwise, the patient has not required any recent emergency interventions for hypoglycemia and has not had recent hospitalizations secondary to hyper or hypoglycemic episodes.    Would like to switch Novolin 70/30  ROS: As per HPI and as detailed below: Review of Systems  Constitutional: Negative for fever.  HENT: Positive for congestion. Negative for sore throat.   Respiratory: Negative for cough and shortness of breath.   Gastrointestinal: Negative for diarrhea and nausea.      HOME DIABETES REGIMEN:  Metformin 500 mg XR 1 Tabs BID Ozempic 0.5 mg weekly  Lantus 22 units daily - skips 3 in the past 2 months  Novolog 4 units TID QAC    GLUCOSE LOG: Date  Breakfast  Dinner Bedtime  07/29/18 174 136   6/21  143   6/20 139 63  175         DIABETIC COMPLICATIONS: Microvascular complications:   Neuropathy  Denies: retinopothy   Last eye exam: Completed > 3 yrs ago   Macrovascular complications:   PVD, CAD   Denies: CVA    HISTORY:  Past Medical History:  Past Medical History:  Diagnosis Date  . Asthma   . Coronary atherosclerosis of native coronary artery    DES x 2 (overlapping) RCA 03/2008 - patent 2011  . Diabetes  type 2, controlled ([place]) 2000  . Essential hypertension, benign   . Fatty liver   . GERD (gastroesophageal reflux disease)   . Hyperlipidemia   . Microalbuminuria   . Obesity   . Odynophagia    Severe esophagitis on CT 05/2010, treated empirically with Diflucan  . PAD (peripheral artery disease) ([facility])    history of left greater than right lower 70 claudication  . Tubular adenoma of colon 07/2007   Multiple colonic polyps  . Venous stasis    Past Surgical History:  Past Surgical History:  Procedure Laterality Date  . BIOPSY  02/14/2018    Procedure: BIOPSY;  Surgeon: Daneil Dolin, MD;  [facility];  Service: Endoscopy;;  gastric bx's  . CARDIAC CATHETERIZATION     stents  . CHOLECYSTECTOMY N/A 07/30/2015   Procedure: LAPAROSCOPIC CHOLECYSTECTOMY;  Surgeon: Aviva Signs, MD;  Location: [facility];  Service: General;  Laterality: N/A;  . COLONOSCOPY W/ POLYPECTOMY  07/2007   Tubular adenoma  . COLONOSCOPY WITH PROPOFOL N/A 02/14/2018   Dr. Gala Romney: diverticulosis in sigmoid and descending colon, three 4-6 mm polyps in descending colon and ascending, tubular adenomas  . CORONARY STENT PLACEMENT    . CYSTECTOMY     Pilonidal  . ESOPHAGOGASTRODUODENOSCOPY  04/2010   RMR: Normal esophagus , Stomach D1 and D2   . ESOPHAGOGASTRODUODENOSCOPY (EGD) WITH PROPOFOL N/A 02/14/2018   Dr. Gala Romney: mild erosive reflux esophagitis, s/p dilatation, erosive gastropathy s/p biopsy, chronic inactive gastritis, negative H.pylori  . LOWER EXTREMITY ANGIOGRAPHY N/A 03/13/2016   Procedure: Lower Extremity Angiography;  Surgeon: [name], MD;  Location: [place] CV LAB;  Service: Cardiovascular;  Laterality: N/A;  . Venia Minks DILATION N/A 02/14/2018   Procedure: Venia Minks DILATION;  Surgeon: Daneil Dolin, MD;  [facility];  Service: Endoscopy;  Laterality: N/A;  . PERIPHERAL VASCULAR ATHERECTOMY  03/27/2016   Procedure: Peripheral Vascular Atherectomy;  Surgeon: [name], MD;  Location: [place] CV LAB;  Service: Cardiovascular;;  Attempted atherectomy  . PERIPHERAL VASCULAR CATHETERIZATION Bilateral 09/28/2014   Procedure: Lower Extremity Angiography;  Surgeon: [name], MD;  Location: [place] CV LAB;  Service: Cardiovascular;  Laterality: Bilateral;  . PERIPHERAL VASCULAR CATHETERIZATION N/A 09/28/2014   Procedure: Abdominal Aortogram;  Surgeon: [name], MD;  Location: [place] [facility];  Service: Cardiovascular;  Laterality: N/A;  . PERIPHERAL VASCULAR CATHETERIZATION N/A 10/15/2014   Procedure: Lower  Extremity Angiography;  Surgeon: [name], MD;  Location: [place] [facility] LAB;  Service: Cardiovascular;  Laterality: N/A;  . PERIPHERAL VASCULAR CATHETERIZATION  10/15/2014   Procedure: Peripheral Vascular Intervention;  Surgeon: [name], MD;  Location: [place] [facility] LAB;  Service: Cardiovascular;;  bilateral iliac stents  . PILONIDAL CYST / SINUS EXCISION    . POLYPECTOMY  02/14/2018   Procedure: POLYPECTOMY;  Surgeon: Daneil Dolin, MD;  [facility];  Service: Endoscopy;;  ascending colon polyps x2, descending polyps x3  . TUBAL LIGATION Bilateral     Social History:  reports that she has been smoking cigarettes. She has a 12.50 pack-year smoking history. She has never used smokeless tobacco. She reports that she does not drink alcohol or use drugs. Family History:  Family History  Problem Relation Age of Onset  . Bone cancer Father   . Heart disease Maternal Grandmother   . Hypertension Mother   . Diabetes Mother   . Colon cancer Neg Hx   . Liver disease  Neg Hx   . Inflammatory bowel disease Neg Hx      HOME MEDICATIONS: Allergies as of 07/29/2018      Reactions   Altace [ramipril] Cough   Biaxin [clarithromycin] Other (See Comments)   Fatigue   Niacin And Related Other (See Comments)   Flush - felt like on fire.   Nsaids Other (See Comments)   feverish   Tramadol Other (See Comments)   Lethargic    Adhesive [tape] Rash, Other (See Comments)   Paper tape is ok      Medication List       Accurate as of July 29, 2018  8:06 AM. If you have any questions, ask your nurse or doctor.        albuterol 108 (90 Base) MCG/ACT inhaler Commonly known as: ProAir HFA Inhale 2 puffs into the lungs every 4 (four) hours as needed for wheezing or shortness of breath.   aspirin EC 81 MG tablet Take 81 mg by mouth daily.   clopidogrel 75 MG tablet Commonly known as: PLAVIX Take 1 tablet (75 mg total) by mouth daily.   fluconazole 150 MG tablet  Commonly known as: DIFLUCAN Take one tablet 3 days apart   fluticasone 50 MCG/ACT nasal spray Commonly known as: FLONASE Place 2 sprays into both nostrils daily.   FreeStyle Libre 14 Day Sensor Misc 1 Device by Does not apply route as directed.   gabapentin 400 MG capsule Commonly known as: NEURONTIN TAKE 1 CAPSULE BY MOUTH AT BEDTIME AS DIRECTED   insulin aspart 100 UNIT/ML FlexPen Commonly known as: NovoLOG FlexPen Inject 4 Units into the skin 3 (three) times daily with meals.   Insulin Glargine 100 UNIT/ML Solostar Pen Commonly known as: Lantus SoloStar Inject 22 Units into the skin daily.   levothyroxine 100 MCG tablet Commonly known as: SYNTHROID TAKE 1 TABLET BY MOUTH EVERY DAY BEFORE BREAKFAST What changed: See the new instructions.   meclizine 25 MG tablet Commonly known as: ANTIVERT Take 1 tablet (25 mg total) by mouth 3 (three) times daily as needed for dizziness.   metFORMIN 500 MG 24 hr tablet Commonly known as: Glucophage XR Take 1 tablet (500 mg total) by mouth 2 (two) times daily with a meal.   pantoprazole 40 MG tablet Commonly known as: PROTONIX Take 1 tablet (40 mg total) by mouth 2 (two) times daily before a meal.   potassium chloride SA 20 MEQ tablet Commonly known as: K-DUR Take 1 tablet (20 mEq total) by mouth 2 (two) times daily.   rosuvastatin 40 MG tablet Commonly known as: CRESTOR TAKE 1 TABLET AT BEDTIME   Semaglutide(0.25 or 0.5MG /DOS) 2 MG/1.5ML Sopn Commonly known as: Ozempic (0.25 or 0.5 MG/DOSE) Inject 0.5 mg into the skin once a week.   tiotropium 18 MCG inhalation capsule Commonly known as: SPIRIVA Place 1 capsule (18 mcg total) into inhaler and inhale daily.         DATA REVIEWED:  Lab Results  Component Value Date   HGBA1C 8.0 (H) 11/13/2017   HGBA1C 7.6 (H) 05/10/2017   HGBA1C 8.8 (H) 02/08/2017   Lab Results  Component Value Date   MICROALBUR 1.4 02/08/2017   LDLCALC 61 12/07/2017   CREATININE 0.97  04/05/2018   Lab Results  Component Value Date   MICRALBCREAT 67 (H) 02/08/2017     Lab Results  Component Value Date   CHOL 132 12/07/2017   HDL 52 12/07/2017   LDLCALC 61 12/07/2017   TRIG 93 12/07/2017  CHOLHDL 2.5 12/07/2017         ASSESSMENT / PLAN / RECOMMENDATIONS:   1) Type 2 Diabetes Mellitus, Sub-Optimally controlled, With neuropathic and macrovascular complications - Most recent A1c of 8.0 %. Goal A1c < 7.5 %.    Plan:  - She is tolerating Ozempic without side effects. She has had mid-day hypoglycemia - I have praised the patient on lifestyle changes and compliance with medication intake.  - She has contacted Dexcom.  - She continues to at times skip on lantus, we again  Discussed pharmacokinetics of long acting insulin.  - I have advised her to stop the Novolog , but she is reluctant and scared, she has been snacking on chips. Counseled about avoiding snacks and discussed low CHO snacks - She has agreed to stop the novolog for this week and see how she does without it, she was advised to call if her BG's are over 150 mg/dL  - We also discussed the difference between Novolog and Novolin 70/30 and how they are not interchangeable , unless we stop lantus and change her treatment regimen all together.     MEDICATIONS:  STOP Novolog  Increase  Lantus to 24 units   Stop Metformin ER  Start  Metformin 1000 mg daily   Continue  Ozempic 0.5 mg weekly   EDUCATION / INSTRUCTIONS:  BG monitoring instructions: Patient is instructed to check her blood sugars 4 times a day, before meals and bedtime.  Call [place] [facility] [facility] if: BG persistently < 70 or > 300.  I reviewed the Rule of 15 for the treatment of hypoglycemia in detail with the patient.    I discussed the assessment and treatment plan with the patient. The patient was provided an opportunity to ask questions and all were answered. The patient agreed with the plan and demonstrated an  understanding of the instructions.   The patient was advised to call back or seek an in-person evaluation if the symptoms worsen or if the condition fails to improve as anticipated.    F/U in 2 months    Signed electronically by: Mack Guise, MD  [facility]  [facility] [address] [place], [place] [address] Phone: [phone] FAX: [phone]   CC: Mikey Kirschner, [address] [place] [address] Phone: [phone]  Fax: [phone]  Return to Endocrinology clinic as below: Future Appointments  Date Time Provider [place]  07/29/2018 10:10 AM Lupe Handley, Melanie Crazier, MD [facility] None  08/27/2018 11:00 AM Carlis Stable, NP [facility]  09/03/2018 11:00 AM Collene Gobble, MD [facility] None

## 2018-08-05 NOTE — Telephone Encounter (Signed)
Opened in error

## 2018-08-19 ENCOUNTER — Telehealth: Payer: Self-pay | Admitting: Internal Medicine

## 2018-08-19 NOTE — Telephone Encounter (Signed)
Patient states Dr. Kelton Pillar will be receiving a fax from [facility] is going through [facility] to get a no cost [employer] 14 day. Patient requests that Dr. Kelton Pillar complete the [facility] form and have form faxed back to [facility].

## 2018-08-20 ENCOUNTER — Other Ambulatory Visit: Payer: Self-pay

## 2018-08-20 MED ORDER — FREESTYLE LIBRE READER DEVI: 1.0000 | Freq: Four times a day (QID) | 0 refills | Status: AC

## 2018-08-20 MED ORDER — FREESTYLE LIBRE 14 DAY SENSOR MISC: 1.0000 | 12 refills | Status: AC

## 2018-08-20 NOTE — Telephone Encounter (Signed)
Patient had called stating that if we send the [place] order to [facility] it will be covered. Orders sent.

## 2018-08-20 NOTE — Telephone Encounter (Signed)
Paperwork received, and given to doctor on call this week([place]) since [facility] is out of office

## 2018-08-21 NOTE — Telephone Encounter (Signed)
Did Dr. Cruzita Lederer sign this paperwork?

## 2018-08-21 NOTE — Telephone Encounter (Signed)
Melissa faxed paperwork to [facility], pt called and made aware

## 2018-08-27 ENCOUNTER — Ambulatory Visit: Payer: Medicare HMO | Admitting: Nurse Practitioner

## 2018-08-27 NOTE — Progress Notes (Deleted)
Referring Provider: Mikey Kirschner, MD Primary Care Physician:  Mikey Kirschner, MD Primary GI:  Dr. Gala Romney  No chief complaint on file.   HPI:   Rachel [name] is a 68 y.o. female who presents for follow-up on diverticulitis and constipation.  The patient was last seen in our office 04/23/2018 for the same.  History of acute GI bleed requiring hospitalization and transfusion of 2 units of PRBCs.  Chronic GERD and insidiously recurrent esophageal dysphagia to solids..  Takes ibuprofen multiple times a week.  Colonoscopy up-to-date 02/14/2018 found diverticulosis, three 4 to 6 mm polyps found to be tubular adenoma.  EGD same day found mild erosive reflux esophagitis status post dilation, erosive gastropathy status post biopsy, gastric erosions status post biopsy which found the biopsies to be chronic inactive gastritis.  Recommended continue current medications repeat colonoscopy in 3 years (2023).  Since her procedures she was admitted to the hospital from 03/10/2018 through 03/13/2018 for abdominal pain, vomiting, hypotension with acute leukocytosis with white blood cell count of 21 and elevated creatinine at 2.03.  CT of the abdomen found diverticulitis for which she was started on Rocephin and Flagyl and she improved after a few days and was discharged on oral antibiotics.  Acute kidney injury improved and creatinine on day of discharge is 1.43.  Follow-up labs 04/05/2018 found normalization of hemoglobin at 12.5 and kidney function with creatinine 0.97.  At her last visit she was doing well, on Protonix twice daily without breakthrough GERD.  No further dysphasia after dilation.  One episode of abdominal pain 2 to 3 days prior to her last visit described as an ache that lasted a couple hours and self resolved.  Noted constipation with "multiple pellets" of stools and occasionally multiple days without a bowel movement.  She had a lot of questions related to diet and diverticulosis.  Drinks  adequate water, and adequate fiber intake.  No other GI complaints.  Recommended add fiber supplement for constipation and if no significant improvement add Colace once daily.  The patient was provided educational information related to diverticulitis and diverticulosis.  Recommend follow-up in 4 months.  Today she states   Past Medical History:  Diagnosis Date  . Asthma   . Coronary atherosclerosis of native coronary artery    DES x 2 (overlapping) RCA 03/2008 - patent 2011  . Diabetes type 2, controlled ([place]) 2000  . Essential hypertension, benign   . Fatty liver   . GERD (gastroesophageal reflux disease)   . Hyperlipidemia   . Microalbuminuria   . Obesity   . Odynophagia    Severe esophagitis on CT 05/2010, treated empirically with Diflucan  . PAD (peripheral artery disease) (HCC)    history of left greater than right lower 70 claudication  . Tubular adenoma of colon 07/2007   Multiple colonic polyps  . Venous stasis     Past Surgical History:  Procedure Laterality Date  . BIOPSY  02/14/2018   Procedure: BIOPSY;  Surgeon: Daneil Dolin, MD;  [facility]: [facility];  Service: Endoscopy;;  gastric bx's  . CARDIAC CATHETERIZATION     stents  . CHOLECYSTECTOMY N/A 07/30/2015   Procedure: LAPAROSCOPIC CHOLECYSTECTOMY;  Surgeon: Aviva Signs, MD;  Location: [facility];  Service: General;  Laterality: N/A;  . COLONOSCOPY W/ POLYPECTOMY  07/2007   Tubular adenoma  . COLONOSCOPY WITH PROPOFOL N/A 02/14/2018   Dr. Gala Romney: diverticulosis in sigmoid and descending colon, three 4-6 mm polyps in descending colon and  ascending, tubular adenomas  . CORONARY STENT PLACEMENT    . CYSTECTOMY     Pilonidal  . ESOPHAGOGASTRODUODENOSCOPY  04/2010   RMR: Normal esophagus , Stomach D1 and D2   . ESOPHAGOGASTRODUODENOSCOPY (EGD) WITH PROPOFOL N/A 02/14/2018   Dr. Gala Romney: mild erosive reflux esophagitis, s/p dilatation, erosive gastropathy s/p biopsy, chronic inactive gastritis, negative H.pylori  . LOWER  EXTREMITY ANGIOGRAPHY N/A 03/13/2016   Procedure: Lower Extremity Angiography;  Surgeon: [name], MD;  Location: [place] [facility];  Service: Cardiovascular;  Laterality: N/A;  . Venia Minks DILATION N/A 02/14/2018   Procedure: Venia Minks DILATION;  Surgeon: Daneil Dolin, MD;  [facility]: [facility];  Service: Endoscopy;  Laterality: N/A;  . PERIPHERAL VASCULAR ATHERECTOMY  03/27/2016   Procedure: Peripheral Vascular Atherectomy;  Surgeon: [name], MD;  Location: [place] [facility];  Service: Cardiovascular;;  Attempted atherectomy  . PERIPHERAL VASCULAR CATHETERIZATION Bilateral 09/28/2014   Procedure: Lower Extremity Angiography;  Surgeon: [name], MD;  Location: [place] [facility];  Service: Cardiovascular;  Laterality: Bilateral;  . PERIPHERAL VASCULAR CATHETERIZATION N/A 09/28/2014   Procedure: Abdominal Aortogram;  Surgeon: [name], MD;  Location: [place] [facility];  Service: Cardiovascular;  Laterality: N/A;  . PERIPHERAL VASCULAR CATHETERIZATION N/A 10/15/2014   Procedure: Lower Extremity Angiography;  Surgeon: [name], MD;  Location: [place] [facility];  Service: Cardiovascular;  Laterality: N/A;  . PERIPHERAL VASCULAR CATHETERIZATION  10/15/2014   Procedure: Peripheral Vascular Intervention;  Surgeon: [name], MD;  Location: [facility];  Service: Cardiovascular;;  bilateral iliac stents  . PILONIDAL CYST / SINUS EXCISION    . POLYPECTOMY  02/14/2018   Procedure: POLYPECTOMY;  Surgeon: Daneil Dolin, MD;  [facility];  Service: Endoscopy;;  ascending colon polyps x2, descending polyps x3  . TUBAL LIGATION Bilateral     Current Outpatient Medications  Medication Sig Dispense Refill  . albuterol (PROAIR HFA) 108 (90 Base) MCG/ACT inhaler Inhale 2 puffs into the lungs every 4 (four) hours as needed for wheezing or shortness of breath. 1 Inhaler 5  . aspirin EC 81 MG tablet Take 81 mg by mouth daily.      . clopidogrel  (PLAVIX) 75 MG tablet Take 1 tablet (75 mg total) by mouth daily. 90 tablet 1  . Continuous Blood Gluc Receiver (FREESTYLE LIBRE READER) DEVI 1 kit by Does not apply route 4 (four) times daily. 1 kit 0  . Continuous Blood Gluc Sensor (FREESTYLE LIBRE 14 DAY SENSOR) MISC 1 Device by Does not apply route as directed. 2 each 12  . fluconazole (DIFLUCAN) 150 MG tablet Take one tablet 3 days apart 2 tablet 0  . fluticasone (FLONASE) 50 MCG/ACT nasal spray Place 2 sprays into both nostrils daily. 48 g 1  . gabapentin (NEURONTIN) 400 MG capsule TAKE 1 CAPSULE BY MOUTH AT BEDTIME AS DIRECTED 90 capsule 1  . Insulin Glargine (LANTUS SOLOSTAR) 100 UNIT/ML Solostar Pen Inject 24 Units into the skin daily. 15 mL 6  . levothyroxine (SYNTHROID, LEVOTHROID) 100 MCG tablet TAKE 1 TABLET BY MOUTH EVERY DAY BEFORE BREAKFAST (Patient taking differently: Take 100 mcg by mouth daily before breakfast. ) 90 tablet 0  . meclizine (ANTIVERT) 25 MG tablet Take 1 tablet (25 mg total) by mouth 3 (three) times daily as needed for dizziness. 30 tablet 2  . metFORMIN (GLUCOPHAGE) 1000 MG tablet Take 1 tablet (1,000 mg total) by mouth daily with breakfast. 90 tablet  3  . pantoprazole (PROTONIX) 40 MG tablet Take 1 tablet (40 mg total) by mouth 2 (two) times daily before a meal. 180 tablet 3  . rosuvastatin (CRESTOR) 40 MG tablet TAKE 1 TABLET AT BEDTIME 90 tablet 0  . Semaglutide,0.25 or 0.'5MG'$ /DOS, (OZEMPIC, 0.25 OR 0.5 MG/DOSE,) 2 MG/1.5ML SOPN Inject 0.5 mg into the skin once a week. 4 pen 11  . tiotropium (SPIRIVA) 18 MCG inhalation capsule Place 1 capsule (18 mcg total) into inhaler and inhale daily. 30 capsule 6   No current facility-administered medications for this visit.     Allergies as of 08/27/2018 - Review Complete 07/29/2018  Allergen Reaction Noted  . Altace [ramipril] Cough 06/07/2012  . Biaxin [clarithromycin] Other (See Comments) 06/07/2012  . Niacin and related Other (See Comments) 06/07/2012  . Nsaids  Other (See Comments) 10/09/2014  . Tramadol Other (See Comments) 11/06/2017  . Adhesive [tape] Rash and Other (See Comments) 03/22/2016    Family History  Problem Relation Age of Onset  . Bone cancer Father   . Heart disease Maternal Grandmother   . Hypertension Mother   . Diabetes Mother   . Colon cancer Neg Hx   . Liver disease Neg Hx   . Inflammatory bowel disease Neg Hx     Social History   Socioeconomic History  . Marital status: Divorced    Spouse name: Not on file  . Number of children: 1  . Years of education: Not on file  . Highest education level: Not on file  Occupational History  . Occupation: retired  Scientific laboratory technician  . Financial resource strain: Not on file  . Food insecurity    Worry: Not on file    Inability: Not on file  . Transportation needs    Medical: Not on file    Non-medical: Not on file  Tobacco Use  . Smoking status: Current Every Day Smoker    Packs/day: 0.25    Years: 50.00    Pack years: 12.50    Types: Cigarettes  . Smokeless tobacco: Never Used  Substance and Sexual Activity  . Alcohol use: No  . Drug use: No  . Sexual activity: Yes    Partners: Male    Birth control/protection: Condom    Comment: mutual friends  Lifestyle  . Physical activity    Days per week: Not on file    Minutes per session: Not on file  . Stress: Not on file  Relationships  . Social Herbalist on phone: Not on file    Gets together: Not on file    Attends religious service: Not on file    Active member of club or organization: Not on file    Attends meetings of clubs or organizations: Not on file    Relationship status: Not on file  Other Topics Concern  . Not on file  Social History Narrative  . Not on file    Review of Systems: General: Negative for anorexia, weight loss, fever, chills, fatigue, weakness. Eyes: Negative for vision changes.  ENT: Negative for hoarseness, difficulty swallowing , nasal congestion. CV: Negative for chest  pain, angina, palpitations, dyspnea on exertion, peripheral edema.  Respiratory: Negative for dyspnea at rest, dyspnea on exertion, cough, sputum, wheezing.  GI: See history of present illness. GU:  Negative for dysuria, hematuria, urinary incontinence, urinary frequency, nocturnal urination.  MS: Negative for joint pain, low back pain.  Derm: Negative for rash or itching.  Neuro: Negative for weakness, abnormal  sensation, seizure, frequent headaches, memory loss, confusion.  Psych: Negative for anxiety, depression, suicidal ideation, hallucinations.  Endo: Negative for unusual weight change.  Heme: Negative for bruising or bleeding. Allergy: Negative for rash or hives.   Physical Exam: There were no vitals taken for this visit. General:   Alert and oriented. Pleasant and cooperative. Well-nourished and well-developed.  Head:  Normocephalic and atraumatic. Eyes:  Without icterus, sclera clear and conjunctiva pink.  Ears:  Normal auditory acuity. Mouth:  No deformity or lesions, oral mucosa pink.  Throat/Neck:  Supple, without mass or thyromegaly. Cardiovascular:  S1, S2 present without murmurs appreciated. Normal pulses noted. Extremities without clubbing or edema. Respiratory:  Clear to auscultation bilaterally. No wheezes, rales, or rhonchi. No distress.  Gastrointestinal:  +BS, soft, non-tender and non-distended. No HSM noted. No guarding or rebound. No masses appreciated.  Rectal:  Deferred  Musculoskalatal:  Symmetrical without gross deformities. Normal posture. Skin:  Intact without significant lesions or rashes. Neurologic:  Alert and oriented x4;  grossly normal neurologically. Psych:  Alert and cooperative. Normal mood and affect. Heme/Lymph/Immune: No significant cervical adenopathy. No excessive bruising noted.     08/27/2018 9:35 AM   Disclaimer: This note was dictated with voice recognition software. Similar sounding words can inadvertently be transcribed and may not  be corrected upon review.

## 2018-08-28 ENCOUNTER — Other Ambulatory Visit: Payer: Self-pay | Admitting: Family Medicine

## 2018-09-02 NOTE — Progress Notes (Deleted)
$'@Patient'N$  ID: Rachel [name], female    DOB: 31-Aug-1950, 68 y.o.   MRN: [id number]  No chief complaint on file.   Referring provider: Mikey Kirschner, MD  HPI:  68 year old female current every day smoker followed in our office for COPD  PMH: Allergic rhinitis, GERD, peripheral arterial disease (managed by Dr. Gwenlyn Found), type 2 diabetes, dyslipidemia, hypertension Smoker/ Smoking History: Current every day smoker Maintenance: Spiriva Respimat Pt of: Dr. Lamonte Sakai  ROV 01/24/18 --Rachel [name] is 59, has COPD and history of tobacco use.  Also nasal congestion and allergic rhinitis, GERD.  We are managing her on Spiriva Respimat.  She uses albuterol approximately 1-2x a month.  She has been working on decreasing her smoking and is currently at 3 cigarettes per day.  She underwent a low-dose CT scan for lung cancer screening on 01/09/2018 and I have reviewed.  This shows multiple bilateral nodules that measure up to 11 mm in the left upper lobe with some adjacent ill-defined groundglass. We treated her empirically with doxycycline. She had less mucous and cough while she was on it. She is on flonase, sometimes uses bid. Not on anti-histamine - hasn' t really benefited before. She has never done [place]. Cough is her most bothersome sx right now.   09/02/2018  - Visit   HPI  Tests:   01/09/2018-CT chest lung cancer screening- multiple bilateral pulmonary nodules, largest of which is the left upper lobe and has adjacent amorphous groundglass, lung RADS 4 a suspicious, follow-up low-dose CT without contrast in 4 to 6 weeks is recommended, left renal stone, emphysema  09/24/2008-CT sinus-no active sinus disease, probable mucous retention cyst in both maxillary sinuses  03/29/2017- pulmonary function test- FVC 2.03 (83% predicted, postbronchodilator ratio 78, postbronchodilator FEV1 1.43 (75% predicted), no bronchodilator response, DLCO 31  FENO:  No results found for: NITRICOXIDE  PFT: PFT Results  Latest Ref Rng & Units 03/29/2017  FVC-Pre L 2.03  FVC-Predicted Pre % 83  FVC-Post L 1.84  FVC-Predicted Post % 76  Pre FEV1/FVC % % 73  Post FEV1/FCV % % 78  FEV1-Pre L 1.48  FEV1-Predicted Pre % 78  FEV1-Post L 1.43  DLCO UNC% % 31  DLCO COR %Predicted % 36  TLC L 4.66  TLC % Predicted % 95  RV % Predicted % 112    Imaging: No results found.    Specialty Problems      Pulmonary Problems   COPD (chronic obstructive pulmonary disease) (HCC)   Allergic rhinitis   Pulmonary nodules      Allergies  Allergen Reactions  . Altace [Ramipril] Cough  . Biaxin [Clarithromycin] Other (See Comments)    Fatigue  . Niacin And Related Other (See Comments)    Flush - felt like on fire.  . Nsaids Other (See Comments)    feverish  . Tramadol Other (See Comments)    Lethargic   . Adhesive [Tape] Rash and Other (See Comments)    Paper tape is ok    Immunization History  Administered Date(s) Administered  . Influenza,inj,Quad PF,6+ Mos 10/18/2015, 11/16/2016, 11/12/2017  . Influenza-Unspecified 11/07/2007, 12/07/2013  . Pneumococcal Polysaccharide-23 12/06/2006    Past Medical History:  Diagnosis Date  . Asthma   . Coronary atherosclerosis of native coronary artery    DES x 2 (overlapping) RCA 03/2008 - patent 2011  . Diabetes type 2, controlled ([place]) 2000  . Essential hypertension, benign   . Fatty liver   . GERD (gastroesophageal reflux disease)   .  Hyperlipidemia   . Microalbuminuria   . Obesity   . Odynophagia    Severe esophagitis on CT 05/2010, treated empirically with Diflucan  . PAD (peripheral artery disease) (HCC)    history of left greater than right lower 70 claudication  . Tubular adenoma of colon 07/2007   Multiple colonic polyps  . Venous stasis     Tobacco History: Social History   Tobacco Use  Smoking Status Current Every Day Smoker  . Packs/day: 0.25  . Years: 50.00  . Pack years: 12.50  . Types: Cigarettes  Smokeless Tobacco Never Used    Ready to quit: Not Answered Counseling given: Not Answered   Continue to not smoke  Outpatient Encounter Medications as of 09/03/2018  Medication Sig  . albuterol (PROAIR HFA) 108 (90 Base) MCG/ACT inhaler Inhale 2 puffs into the lungs every 4 (four) hours as needed for wheezing or shortness of breath.  Marland Kitchen aspirin EC 81 MG tablet Take 81 mg by mouth daily.    . clopidogrel (PLAVIX) 75 MG tablet Take 1 tablet (75 mg total) by mouth daily.  . Continuous Blood Gluc Receiver (FREESTYLE LIBRE READER) DEVI 1 kit by Does not apply route 4 (four) times daily.  . Continuous Blood Gluc Sensor (FREESTYLE LIBRE 14 DAY SENSOR) MISC 1 Device by Does not apply route as directed.  . fluconazole (DIFLUCAN) 150 MG tablet Take one tablet 3 days apart  . fluticasone (FLONASE) 50 MCG/ACT nasal spray Place 2 sprays into both nostrils daily.  Marland Kitchen gabapentin (NEURONTIN) 400 MG capsule TAKE 1 CAPSULE BY MOUTH AT BEDTIME AS DIRECTED  . Insulin Glargine (LANTUS SOLOSTAR) 100 UNIT/ML Solostar Pen Inject 24 Units into the skin daily.  Marland Kitchen levothyroxine (SYNTHROID, LEVOTHROID) 100 MCG tablet TAKE 1 TABLET BY MOUTH EVERY DAY BEFORE BREAKFAST (Patient taking differently: Take 100 mcg by mouth daily before breakfast. )  . meclizine (ANTIVERT) 25 MG tablet Take 1 tablet (25 mg total) by mouth 3 (three) times daily as needed for dizziness.  . metFORMIN (GLUCOPHAGE) 1000 MG tablet Take 1 tablet (1,000 mg total) by mouth daily with breakfast.  . pantoprazole (PROTONIX) 40 MG tablet Take 1 tablet (40 mg total) by mouth 2 (two) times daily before a meal.  . rosuvastatin (CRESTOR) 40 MG tablet TAKE 1 TABLET AT BEDTIME  . Semaglutide,0.25 or 0.5MG/DOS, (OZEMPIC, 0.25 OR 0.5 MG/DOSE,) 2 MG/1.5ML SOPN Inject 0.5 mg into the skin once a week.  . tiotropium (SPIRIVA) 18 MCG inhalation capsule Place 1 capsule (18 mcg total) into inhaler and inhale daily.   No facility-administered encounter medications on file as of 09/03/2018.       Review of Systems  Review of Systems   Physical Exam  There were no vitals taken for this visit.  Wt Readings from Last 5 Encounters:  07/29/18 168 lb (76.2 kg)  06/20/18 148 lb (67.1 kg)  04/23/18 155 lb 6.4 oz (70.5 kg)  03/21/18 151 lb (68.5 kg)  03/10/18 154 lb 1.6 oz (69.9 kg)     Physical Exam   Lab Results:  CBC    Component Value Date/Time   WBC 7.1 04/05/2018 1138   WBC 6.0 03/13/2018 0553   RBC 4.05 04/05/2018 1138   RBC 3.62 (L) 03/13/2018 0553   HGB 12.5 04/05/2018 1138   HCT 37.7 04/05/2018 1138   PLT 309 04/05/2018 1138   MCV 93 04/05/2018 1138   MCH 30.9 04/05/2018 1138   MCH 30.4 03/13/2018 0553   MCHC 33.2 04/05/2018 1138  MCHC 33.4 03/13/2018 0553   RDW 13.3 04/05/2018 1138   LYMPHSABS 2.5 04/05/2018 1138   MONOABS 1.3 (H) 03/10/2018 1256   EOSABS 0.2 04/05/2018 1138   BASOSABS 0.0 04/05/2018 1138    BMET    Component Value Date/Time   NA 141 04/05/2018 1138   K 3.6 04/05/2018 1138   CL 103 04/05/2018 1138   CO2 22 04/05/2018 1138   GLUCOSE 170 (H) 04/05/2018 1138   GLUCOSE 152 (H) 03/13/2018 0553   BUN 9 04/05/2018 1138   CREATININE 0.97 04/05/2018 1138   CREATININE 1.02 (H) 05/10/2017 1505   CALCIUM 9.5 04/05/2018 1138   GFRNONAA 61 04/05/2018 1138   GFRNONAA 58 (L) 05/10/2017 1505   GFRAA 71 04/05/2018 1138   GFRAA 67 05/10/2017 1505    BNP No results found for: BNP  ProBNP No results found for: PROBNP    Assessment & Plan:   No problem-specific Assessment & Plan notes found for this encounter.    No follow-ups on file.   Lauraine Rinne, NP 09/02/2018   This appointment was *** minutes long with over 50% of the time in direct face-to-face patient care, assessment, plan of care, and follow-up.

## 2018-09-03 ENCOUNTER — Telehealth: Payer: Self-pay | Admitting: Pulmonary Disease

## 2018-09-03 ENCOUNTER — Ambulatory Visit: Payer: Medicare HMO | Admitting: Emergency Medicine

## 2018-09-03 ENCOUNTER — Ambulatory Visit: Payer: Medicare HMO | Admitting: Pulmonary Disease

## 2018-09-03 NOTE — Telephone Encounter (Signed)
09/03/2018 1209  Triage,  Please contact the patient to get her rescheduled.  Patient needs to be scheduled with either Dr. Lamonte Sakai, Eric Form NP or Wyn Quaker, [place].   Patient's last CT lung cancer screening done in December/2019 was a lung RADS 4A.  Patient saw Dr. Lamonte Sakai 01/25/2019 after that where he ordered a CT super D chest.  This has not been completed yet.  Patient no showed to the office visit today where this could have been discussed.  Patient needs to have follow-up with our office so we can discuss CT imaging, order appropriate imaging, and continue to work with the patient regarding her abnormal imaging and December/2019.  Also need to work with patient on smoking cessation.  Routing to Dr. Lamonte Sakai as well as SG for FYI.  Wyn Quaker, FNP

## 2018-09-03 NOTE — Telephone Encounter (Signed)
I attempted to call pt, line went to voicemail. I have left a detailed message X1 per pt DPR on home phone and let pt know to call our office back as soon as possible to reschedule her appointment with Dr. Lamonte Sakai, Eric Form NP, or Wyn Quaker NP.

## 2018-09-03 NOTE — Telephone Encounter (Signed)
I attempted to try to see if I could reach pt but unable to reach her. Left message for pt to return call.

## 2018-09-03 NOTE — Telephone Encounter (Signed)
Thanks  Brian

## 2018-09-03 NOTE — Telephone Encounter (Signed)
Thank you   Brian  

## 2018-09-05 NOTE — Telephone Encounter (Signed)
Thank you noted.   Aaron Edelman

## 2018-09-05 NOTE — Telephone Encounter (Signed)
Spoke with patient. She stated that she overslept and missed her appt on 7/28 and apologizes for missing the appointment. She has been rescheduled for 8/4 at 230. COVID screening at time of call was negative.   Will route to Aaron Edelman so he is aware that she will be here on Tuesday.

## 2018-09-09 NOTE — Progress Notes (Signed)
_0  ID: Rachel [name], female    DOB: [birth year], 68 y.o.   MRN: [id number]  Chief Complaint  Patient presents with  . Follow-up    Patient reports that when she exercises or exerts herself she gets sob.     Referring provider: Mikey Kirschner, MD  HPI:  68 year old female current every day smoker followed in our office for COPD  PMH: Allergic rhinitis, GERD, peripheral arterial disease (managed by Dr. Gwenlyn Found), type 2 diabetes, dyslipidemia, hypertension Smoker/ Smoking History: Current every day smoker. 0.5 ppd. 50 pack year  Maintenance: Spiriva Handihaler  Pt of: Dr. Lamonte Sakai  09/10/2018  - Visit   68 year old female female current every day smoker (0.5 packs/day, 50-pack-year history), patient was scheduled last week with our office but she missed that office visit.  She still has not completed her CT super D chest that was ordered to be completed in March/2020.  Patient reports that she thought that the CT that was she was scheduled to complete in March was the same as her lung cancer screening CT which is free so she did not want to have to complete it.  Patient also reports that she is currently now in the donut hole.  Patient was on Spiriva HandiHaler which now cost over $100.  Patient cannot report this anymore.  Patient reports he has been without Spiriva HandiHaler for 3 days.  Patient is interested if we have any other options that may be more cost effective.  She is wondering if she can start using albuterol instead of Spiriva due to cost.  Patient is currently not interested in stopping smoking at this time.  She reports that she may be interested based off of her upcoming CT results if those are stable.  MMRC - Breathlessness Score 2 - on level ground, I walk slower than people of the same age because of breathlessness, or have to stop for breathe when walking to my own pace   Tests:   01/09/2018-CT chest lung cancer screening- multiple bilateral pulmonary nodules,  largest of which is the left upper lobe and has adjacent amorphous groundglass, lung RADS 4 a suspicious, follow-up low-dose CT without contrast in 4 to 6 weeks is recommended, left renal stone, emphysema  09/24/2008-CT sinus-no active sinus disease, probable mucous retention cyst in both maxillary sinuses  03/29/2017- pulmonary function test- FVC 2.03 (83% predicted, postbronchodilator ratio 78, postbronchodilator FEV1 1.43 (75% predicted), no bronchodilator response, DLCO 31  FENO:  No results found for: NITRICOXIDE  PFT: PFT Results Latest Ref Rng & Units 03/29/2017  FVC-Pre L 2.03  FVC-Predicted Pre % 83  FVC-Post L 1.84  FVC-Predicted Post % 76  Pre FEV1/FVC % % 73  Post FEV1/FCV % % 78  FEV1-Pre L 1.48  FEV1-Predicted Pre % 78  FEV1-Post L 1.43  DLCO UNC% % 31  DLCO COR %Predicted % 36  TLC L 4.66  TLC % Predicted % 95  RV % Predicted % 112    Imaging: No results found.    Specialty Problems      Pulmonary Problems   COPD (chronic obstructive pulmonary disease) ([place])    03/29/2017- pulmonary function test- FVC 2.03 (83% predicted, postbronchodilator ratio 78, postbronchodilator FEV1 1.43 (75% predicted), no bronchodilator response, DLCO 31       Allergic rhinitis   Pulmonary nodules      Allergies  Allergen Reactions  . Altace [Ramipril] Cough  . Biaxin [Clarithromycin] Other (See Comments)    Fatigue  . Niacin  And Related Other (See Comments)    Flush - felt like on fire.  . Nsaids Other (See Comments)    feverish  . Tramadol Other (See Comments)    Lethargic   . Adhesive [Tape] Rash and Other (See Comments)    Paper tape is ok    Immunization History  Administered Date(s) Administered  . Influenza,inj,Quad PF,6+ Mos 10/18/2015, 11/16/2016, 11/12/2017  . Influenza-Unspecified 11/07/2007, 12/07/2013  . Pneumococcal Polysaccharide-23 12/06/2006   Needs Pneumovax23   Past Medical History:  Diagnosis Date  . Asthma   . Coronary atherosclerosis of  native coronary artery    DES x 2 (overlapping) RCA 03/2008 - patent 2011  . Diabetes type 2, controlled ([place]) 2000  . Essential hypertension, benign   . Fatty liver   . GERD (gastroesophageal reflux disease)   . Hyperlipidemia   . Microalbuminuria   . Obesity   . Odynophagia    Severe esophagitis on CT 05/2010, treated empirically with Diflucan  . PAD (peripheral artery disease) (H[facility])    history of left greater than right lower 70 claudication  . Tubular adenoma of colon 07/2007   Multiple colonic polyps  . Venous stasis     Tobacco History: Social History   Tobacco Use  Smoking Status Current Every Day Smoker  . Packs/day: 1.00  . Years: 50.00  . Pack years: 50.00  . Types: Cigarettes  Smokeless Tobacco Never Used  Tobacco Comment   0.5ppd today    Ready to quit: Not Answered Counseling given: Yes Comment: 0.5ppd today   Smoking assessment and cessation counseling  Patient currently smoking: 0.5 ppd I have advised the patient to quit/stop smoking as soon as possible due to high risk for multiple medical problems.  It will also be very difficult for [place] to manage patient's  respiratory symptoms and status if we continue to expose her lungs to a known irritant.  We do not advise e-cigarettes as a form of stopping smoking.  Patient is willing to quit smoking. Needs to set quit date.   I have advised the patient that we can assist and have options of nicotine replacement therapy, provided smoking cessation education today, provided smoking cessation counseling, and provided cessation resources. Never tried smoking cessation in the past.   Follow-up next office visit office visit for assessment of smoking cessation.  Smoking cessation counseling advised for: 5 min    Outpatient Encounter Medications as of 09/10/2018  Medication Sig  . albuterol (PROAIR HFA) 108 (90 Base) MCG/ACT inhaler Inhale 2 puffs into the lungs every 4 (four) hours as needed for wheezing or shortness of  breath. Please supply 3 months.  Marland Kitchen aspirin EC 81 MG tablet Take 81 mg by mouth daily.    . clopidogrel (PLAVIX) 75 MG tablet Take 1 tablet (75 mg total) by mouth daily.  . Continuous Blood Gluc Receiver (FREESTYLE LIBRE READER) DEVI 1 kit by Does not apply route 4 (four) times daily.  . Continuous Blood Gluc Sensor (FREESTYLE LIBRE 14 DAY SENSOR) MISC 1 Device by Does not apply route as directed.  . fluconazole (DIFLUCAN) 150 MG tablet Take one tablet 3 days apart  . fluticasone (FLONASE) 50 MCG/ACT nasal spray Place 2 sprays into both nostrils daily.  Marland Kitchen gabapentin (NEURONTIN) 400 MG capsule TAKE 1 CAPSULE BY MOUTH AT BEDTIME AS DIRECTED  . Insulin Glargine (LANTUS SOLOSTAR) 100 UNIT/ML Solostar Pen Inject 24 Units into the skin daily.  Marland Kitchen levothyroxine (SYNTHROID, LEVOTHROID) 100 MCG tablet TAKE 1 TABLET BY  MOUTH EVERY DAY BEFORE BREAKFAST (Patient taking differently: Take 100 mcg by mouth daily before breakfast. )  . meclizine (ANTIVERT) 25 MG tablet Take 1 tablet (25 mg total) by mouth 3 (three) times daily as needed for dizziness.  . pantoprazole (PROTONIX) 40 MG tablet Take 1 tablet (40 mg total) by mouth 2 (two) times daily before a meal.  . rosuvastatin (CRESTOR) 40 MG tablet TAKE 1 TABLET AT BEDTIME  . Semaglutide,0.25 or 0.5MG/DOS, (OZEMPIC, 0.25 OR 0.5 MG/DOSE,) 2 MG/1.5ML SOPN Inject 0.5 mg into the skin once a week.  . tiotropium (SPIRIVA) 18 MCG inhalation capsule Place 1 capsule (18 mcg total) into inhaler and inhale daily.  . [DISCONTINUED] albuterol (PROAIR HFA) 108 (90 Base) MCG/ACT inhaler Inhale 2 puffs into the lungs every 4 (four) hours as needed for wheezing or shortness of breath.  . umeclidinium bromide (INCRUSE ELLIPTA) 62.5 MCG/INH AEPB Inhale 1 puff into the lungs daily.  . [DISCONTINUED] metFORMIN (GLUCOPHAGE) 1000 MG tablet Take 1 tablet (1,000 mg total) by mouth daily with breakfast.   No facility-administered encounter medications on file as of 09/10/2018.       Review of Systems  Review of Systems  Constitutional: Negative for activity change, fatigue and fever.  HENT: Negative for sinus pressure, sinus pain and sore throat.   Respiratory: Positive for cough and shortness of breath (With physical exertion). Negative for wheezing.   Cardiovascular: Negative for chest pain and palpitations.  Gastrointestinal: Negative for diarrhea, nausea and vomiting.  Musculoskeletal: Negative for arthralgias.  Neurological: Negative for dizziness.  Psychiatric/Behavioral: Positive for dysphoric mood. Negative for sleep disturbance. The patient is not nervous/anxious.      Physical Exam  BP 122/70 (BP Location: Left Arm, Cuff Size: Normal)   Pulse 88   Temp 98.3 F (36.8 C) (Oral)   Ht _0  (1.6 m)   Wt 153 lb 3.2 oz (69.5 kg)   SpO2 99%   BMI 27.14 kg/m   Wt Readings from Last 5 Encounters:  09/10/18 153 lb 3.2 oz (69.5 kg)  07/29/18 168 lb (76.2 kg)  06/20/18 148 lb (67.1 kg)  04/23/18 155 lb 6.4 oz (70.5 kg)  03/21/18 151 lb (68.5 kg)    Physical Exam Vitals signs and nursing note reviewed.  Constitutional:      General: She is not in acute distress.    Appearance: Normal appearance.  HENT:     Head: Normocephalic and atraumatic.     Right Ear: Tympanic membrane, ear canal and external ear normal. There is no impacted cerumen.     Left Ear: Tympanic membrane, ear canal and external ear normal. There is no impacted cerumen.     Nose: Nose normal. No congestion.     Mouth/Throat:     Mouth: Mucous membranes are moist.     Pharynx: Oropharynx is clear.  Eyes:     Pupils: Pupils are equal, round, and reactive to light.  Neck:     Musculoskeletal: Normal range of motion.  Cardiovascular:     Rate and Rhythm: Normal rate and regular rhythm.     Pulses: Normal pulses.     Heart sounds: Normal heart sounds. No murmur.  Pulmonary:     Effort: Pulmonary effort is normal. No respiratory distress.     Breath sounds: Normal breath sounds. No  decreased air movement. No decreased breath sounds, wheezing or rales.  Musculoskeletal:     Right lower leg: No edema.     Left lower leg: No edema.  Skin:    General: Skin is warm and dry.     Capillary Refill: Capillary refill takes less than 2 seconds.  Neurological:     General: No focal deficit present.     Mental Status: She is alert and oriented to person, place, and time. Mental status is at baseline.     Gait: Gait normal.  Psychiatric:        Mood and Affect: Mood normal.        Behavior: Behavior normal.        Thought Content: Thought content normal.        Judgment: Judgment normal.      Lab Results:  CBC    Component Value Date/Time   WBC 7.1 04/05/2018 1138   WBC 6.0 03/13/2018 0553   RBC 4.05 04/05/2018 1138   RBC 3.62 (L) 03/13/2018 0553   HGB 12.5 04/05/2018 1138   HCT 37.7 04/05/2018 1138   PLT 309 04/05/2018 1138   MCV 93 04/05/2018 1138   MCH 30.9 04/05/2018 1138   MCH 30.4 03/13/2018 0553   MCHC 33.2 04/05/2018 1138   MCHC 33.4 03/13/2018 0553   RDW 13.3 04/05/2018 1138   LYMPHSABS 2.5 04/05/2018 1138   MONOABS 1.3 (H) 03/10/2018 1256   EOSABS 0.2 04/05/2018 1138   BASOSABS 0.0 04/05/2018 1138    BMET    Component Value Date/Time   NA 141 04/05/2018 1138   K 3.6 04/05/2018 1138   CL 103 04/05/2018 1138   CO2 22 04/05/2018 1138   GLUCOSE 170 (H) 04/05/2018 1138   GLUCOSE 152 (H) 03/13/2018 0553   BUN 9 04/05/2018 1138   CREATININE 0.97 04/05/2018 1138   CREATININE 1.02 (H) 05/10/2017 1505   CALCIUM 9.5 04/05/2018 1138   GFRNONAA 61 04/05/2018 1138   GFRNONAA 58 (L) 05/10/2017 1505   GFRAA 71 04/05/2018 1138   GFRAA 67 05/10/2017 1505    BNP No results found for: BNP  ProBNP No results found for: PROBNP    Assessment & Plan:   COPD (chronic obstructive pulmonary disease) ([place]) Patient reporting today clinical response and improvement when on Spiriva, unfortunately patient now on Medicare donut hole and cannot afford Lung  sounds clear to auscultation today Current every day smoker, 50-pack-year smoking history  Plan: We will trial patient on Incruse Ellipta as we have samples of this Stop Spiriva HandiHaler [facility] for medication cost as well as assistance Emphasized the need to stop smoking, patient declined at this point in time We will get patient set up for super D CT chest as soon as possible as well as close follow-up with Dr. Lamonte Sakai   TOBACCO ABUSE Plan: Emphasized the need to stop smoking We will coordinate Super D CT chest at [facility] Patient have close follow-up with Dr. Lamonte Sakai after completing CT, patient potentially may need EBUS  Abnormal findings on diagnostic imaging of lung Lung RADS 4 a in December/2019  Plan: We will coordinate CT super D chest as soon as possible Close follow-up with Dr. Lamonte Sakai after completing  Medication management Patient in Medicare donut hole  Patient is struggling with affording her diabetic medications as well as her inhaler at our office  Plan: Trial of patient on Incruse Ellipta inhaler today, 1 month  Stop spiriva handihaler  [facility] for assistance with medication cost Tried healthcare network pharmacist likely will need to coordinate with primary care as well as endocrinology regarding patient's other medications  Return in about 2 weeks (around 09/24/2018), or if symptoms worsen or fail to improve, for Follow up with Dr. Lamonte Sakai, After Chest CT.   Lauraine Rinne, NP 09/10/2018   This appointment was 30 minutes long with over 50% of the time in direct face-to-face patient care, assessment, plan of care, and follow-up.

## 2018-09-10 ENCOUNTER — Ambulatory Visit (INDEPENDENT_AMBULATORY_CARE_PROVIDER_SITE_OTHER): Payer: Medicare HMO | Admitting: Pulmonary Disease

## 2018-09-10 ENCOUNTER — Encounter: Payer: Self-pay | Admitting: Pulmonary Disease

## 2018-09-10 ENCOUNTER — Other Ambulatory Visit: Payer: Self-pay

## 2018-09-10 VITALS — BP 122/70 | HR 88 | Temp 98.3°F | Ht 63.0 in | Wt 153.2 lb

## 2018-09-10 DIAGNOSIS — Z79899 Other long term (current) drug therapy: Secondary | ICD-10-CM

## 2018-09-10 DIAGNOSIS — F17200 Nicotine dependence, unspecified, uncomplicated: Secondary | ICD-10-CM

## 2018-09-10 DIAGNOSIS — F17210 Nicotine dependence, cigarettes, uncomplicated: Secondary | ICD-10-CM | POA: Diagnosis not present

## 2018-09-10 DIAGNOSIS — R918 Other nonspecific abnormal finding of lung field: Secondary | ICD-10-CM | POA: Diagnosis not present

## 2018-09-10 DIAGNOSIS — J449 Chronic obstructive pulmonary disease, unspecified: Secondary | ICD-10-CM

## 2018-09-10 MED ORDER — INCRUSE ELLIPTA 62.5 MCG/INH IN AEPB: 1.0000 | INHALATION_SPRAY | Freq: Every day | RESPIRATORY_TRACT | 0 refills | Status: AC

## 2018-09-10 MED ORDER — ALBUTEROL SULFATE HFA 108 (90 BASE) MCG/ACT IN AERS: 2.0000 | INHALATION_SPRAY | RESPIRATORY_TRACT | 11 refills | Status: AC | PRN

## 2018-09-10 NOTE — Assessment & Plan Note (Signed)
Patient in Medicare donut hole  Patient is struggling with affording her diabetic medications as well as her inhaler at our office  Plan: Trial of patient on Incruse Ellipta inhaler today, 1 month  Stop spiriva handihaler  [facility] for assistance with medication cost Tried healthcare network pharmacist likely will need to coordinate with primary care as well as endocrinology regarding patient's other medications

## 2018-09-10 NOTE — Patient Outreach (Signed)
[facility]) Care Management  09/10/2018  DELPHINE SIZEMORE 1950-06-17 [id number]   Telephone Screen  Referral Date: 09/10/2018 Referral Source: MD office(Brian Mack,NP) Referral Reason: " med mgmt, high cost meds: Spiriva, Lantus,Ozempic Novolog, glucose monitor, COPD, DM" Insurance:[employer] Medicare   Outreach attempt # 1 to patient. No answer. RN CM left HIPAA compliant voicemail message along with contact info.      Plan: RN CM will make outreach attempt to patient within 3-4 business days. RN CM will send unsuccessful outreach letter to patient.   Enzo Montgomery, RN,BSN,CCM [place] Management Telephonic Care Management Coordinator Direct Phone: [phone] Toll Free: [phone] Fax: [phone]

## 2018-09-10 NOTE — Assessment & Plan Note (Signed)
Patient reporting today clinical response and improvement when on Spiriva, unfortunately patient now on Medicare donut hole and cannot afford Lung sounds clear to auscultation today Current every day smoker, 50-pack-year smoking history  Plan: We will trial patient on Incruse Ellipta as we have samples of this Stop Spiriva HandiHaler [facility] for medication cost as well as assistance Emphasized the need to stop smoking, patient declined at this point in time We will get patient set up for super D CT chest as soon as possible as well as close follow-up with Dr. Lamonte Sakai

## 2018-09-10 NOTE — Patient Instructions (Addendum)
We have ordered a CT chest super D which was initially planned in March/2020  Trial of Incruse Ellipta  >>> Take 1 puff daily in the morning right when you wake up >>>Rinse your mouth out after use >>>This is a daily maintenance inhaler, NOT a rescue inhaler >>>Contact our office if you are having difficulties affording or obtaining this medication >>>It is important for you to be able to take this daily and not miss any doses  Okay to stop Spiriva HandiHaler at this time  Only use your albuterol as a rescue medication to be used if you can't catch your breath by resting or doing a relaxed purse lip breathing pattern.  - The less you use it, the better it will work when you need it. - Ok to use up to 2 puffs  every 4 hours if you must but call for immediate appointment if use goes up over your usual need - Don't leave home without it !!  (think of it like the spare tire for your car)    Note your daily symptoms > remember "red flags" for COPD:   >>>Increase in cough >>>increase in sputum production >>>increase in shortness of breath or activity  intolerance.   If you notice these symptoms, please call the office to be seen.    We recommend that you stop smoking.  >>>You need to set a quit date >>>If you have friends or family who smoke, let them know you are trying to quit and not to smoke around you or in your living environment  Smoking Cessation Resources:  1 800 QUIT NOW  >>> Patient to call this resource and utilize it to help support her quit smoking >>> Keep up your hard work with stopping smoking  You can also contact the [facility] >>>For smoking cessation classes call [phone]  We do not recommend using e-cigarettes as a form of stopping smoking  You can sign up for smoking cessation support texts and information:  >>>https://smokefree.gov/smokefreetxt      I have placed a referral [facility] to contact you regarding your  medication cost    Return in about 2 weeks (around 09/24/2018), or if symptoms worsen or fail to improve, for Follow up with Dr. Lamonte Sakai, After Chest CT.    Coronavirus (COVID-19) Are you at risk?  Are you at risk for the Coronavirus (COVID-19)?  To be considered HIGH RISK for Coronavirus (COVID-19), you have to meet the following criteria:  . Traveled to [place], [place], [place], [place] or [place]; or in the [place] to [place], [place], [place], or [place]; and have fever, cough, and shortness of breath within the last 2 weeks of travel OR . Been in close contact with a person diagnosed with COVID-19 within the last 2 weeks and have fever, cough, and shortness of breath . IF YOU DO NOT MEET THESE CRITERIA, YOU ARE CONSIDERED LOW RISK FOR COVID-19.  What to do if you are HIGH RISK for COVID-19?  Marland Kitchen If you are having a medical emergency, call 911. . Seek medical care right away. Before you go to a doctor's office, urgent care or emergency department, call ahead and tell them about your recent travel, contact with someone diagnosed with COVID-19, and your symptoms. You should receive instructions from your physician's office regarding next steps of care.  . When you arrive at healthcare provider, tell the healthcare staff immediately you have returned from visiting [place], [place], [place], [place]  or [place]; or traveled in the [place] to [employer], [place], [place], or [place]; in the last two weeks or you have been in close contact with a person diagnosed with COVID-19 in the last 2 weeks.   . Tell the health care staff about your symptoms: fever, cough and shortness of breath. . After you have been seen by a medical provider, you will be either: o Tested for (COVID-19) and discharged home on quarantine except to seek medical care if symptoms worsen, and asked to  - Stay home and avoid contact with others until you get your results (4-5 days)  - Avoid travel on public  transportation if possible (such as bus, train, or airplane) or o Sent to the Emergency Department by EMS for evaluation, COVID-19 testing, and possible admission depending on your condition and test results.  What to do if you are LOW RISK for COVID-19?  Reduce your risk of any infection by using the same precautions used for avoiding the common cold or flu:  Marland Kitchen Wash your hands often with soap and warm water for at least 20 seconds.  If soap and water are not readily available, use an alcohol-based hand sanitizer with at least 60% alcohol.  . If coughing or sneezing, cover your mouth and nose by coughing or sneezing into the elbow areas of your shirt or coat, into a tissue or into your sleeve (not your hands). . Avoid shaking hands with others and consider head nods or verbal greetings only. . Avoid touching your eyes, nose, or mouth with unwashed hands.  . Avoid close contact with people who are sick. . Avoid places or events with large numbers of people in one location, like concerts or sporting events. . Carefully consider travel plans you have or are making. . If you are planning any travel outside or inside the [place], visit the CDC's Travelers' Health webpage for the latest health notices. . If you have some symptoms but not all symptoms, continue to monitor at home and seek medical attention if your symptoms worsen. . If you are having a medical emergency, call 911.   [place] / e-Visit: [email or web address]         [facility]: [facility]: [phone]                   [facility]: [phone]           It is flu season:   >>> Best ways to protect herself from the flu: Receive the yearly flu vaccine, practice good hand hygiene washing with soap and also using hand sanitizer when available, eat a nutritious meals, get  adequate rest, hydrate appropriately   Please contact the office if your symptoms worsen or you have concerns that you are not improving.   Thank you for choosing [facility] for your healthcare, and for allowing [place] to partner with you on your healthcare journey. I am thankful to be able to provide care to you today.   Wyn Quaker FNP-C

## 2018-09-10 NOTE — Assessment & Plan Note (Signed)
Lung RADS 4 a in December/2019  Plan: We will coordinate CT super D chest as soon as possible Close follow-up with Dr. Lamonte Sakai after completing

## 2018-09-10 NOTE — Assessment & Plan Note (Signed)
Plan: Emphasized the need to stop smoking We will coordinate Super D CT chest at [facility] Patient have close follow-up with Dr. Lamonte Sakai after completing CT, patient potentially may need EBUS

## 2018-09-11 ENCOUNTER — Telehealth: Payer: Self-pay | Admitting: Emergency Medicine

## 2018-09-11 ENCOUNTER — Other Ambulatory Visit: Payer: Self-pay

## 2018-09-11 ENCOUNTER — Telehealth: Payer: Self-pay | Admitting: Internal Medicine

## 2018-09-11 ENCOUNTER — Ambulatory Visit (HOSPITAL_COMMUNITY): Admission: RE | Admit: 2018-09-11 | Payer: Medicare HMO | Source: Ambulatory Visit

## 2018-09-11 MED ORDER — METFORMIN HCL 1000 MG PO TABS: 1000.0000 mg | ORAL_TABLET | Freq: Every day | ORAL | 3 refills | Status: AC

## 2018-09-11 NOTE — Telephone Encounter (Signed)
Pt states the the metformin 1000 mg daily was not yet called in. Please call into [facility] please  Because of medicare and the coverage on ozempic it is too expensive for her to take right now.

## 2018-09-11 NOTE — Telephone Encounter (Signed)
Metformin was not on pt medication list so I added per last ov note and e-scribed it to [facility]

## 2018-09-11 NOTE — Telephone Encounter (Signed)
Patient called back. She stated that she didn't have a problem with the CT itself, it was just the way the waiting area and registration was setup. She stated that the waiting room was crowded with people waiting for scans. She also stated that there was a long line with no social distancing in place and other people were coming in and out of the waiting area as they please. Due to seeing all of this, she began to have a panic attack and left.   I explained to her that this scan is necessary in order to follow up on her lungs. Offered to perhaps have the scan done at a smaller location such as [place]. Explained to her that I have never heard of [facility] having a waiting room full of a patients as they are a smaller imaging center. She verbalized understanding and stated that she would love to have the scan done there. If possible, she could like to have the scan done the same day before her appointment so she doesn't have to come back and forth to [place].   Aaron Edelman, please advise if it will be ok for her to have the scan done at [place] a few hours before her OV with RB on 8/17. Thanks!

## 2018-09-11 NOTE — Telephone Encounter (Signed)
Received the above message from [place]. Called patient but the call went straight to VM. Left message for patient to call back. Will route to triage and keep in triage until the patient has been reached.

## 2018-09-11 NOTE — Telephone Encounter (Signed)
There is definitely urgency as the patient was already plan to have the CT completed in March/2020. This was delayed by the pandemic and patients confusion regarding the difference between LD CT screening and a Super D. I can respect the fact the patient is concerned regarding the pandemic, but the reality is that the pandemic is not going away anytime soon. My recommendations would be the patient proceed forward with CT imaging as plan ned. If she would like we could have the CT imaging done at [facility] in [place] as this will be a different setting.  Patient needs to have this imaging done over the next couple of days so that way she can have it completed prior to her office visit with Dr. Lamonte Sakai. I discuss this plan of care with Dr. Lamonte Sakai yesterday and he agreed.  If the patient continues to have concerns or questions regarding thi s work out please schedule her for a video visit or Tele visit if she doesn't have a WebCam or telephone capabilities with me tomorrow so we can further discuss.  Aaron Edelman

## 2018-09-11 NOTE — Telephone Encounter (Signed)
Patient called and stated that she had a good visit here with Wyn Quaker, NP and that she felt safe coming to our office because of the precautions the office is taking.  Patient stated that she went to get CT scan at [facility] and felt like we sent her in to a "hotsile environment."  Patient further elaborated that she felt like there were too many people around, not enough space, coughing, not enough precautions and feels like she was put at a huge risk.  Patient stated that she waited for an hour and decided to leave.  Patient reported she is scheduled for another CT but she doesn't think it is going to be worth it.  Patient would like to speak to a provider to find out how necessary getting a scan done is.  Patient would like to wait until after the pandemic.  Aaron Edelman, please advise.

## 2018-09-11 NOTE — Telephone Encounter (Signed)
-----   Message from Lauraine Rinne, NP sent at 09/11/2018  3:19 PM EDT ----- Regarding: RE: CT Super D So sorry that this happened.   There definitely is urgency and need for the scan.   We will work to contact her.  Lauretta Sallas,  Can you please turn this into a telephone note and have triage contact the patient. She needs to complete her Super D CT chest ASAP and before her follow up with RB in 2 weeks.   If she has additional concerns she can be put on my schedule tomm as a video visit   Aaron Edelman ----- Message ----- From: Sammuel Bailiff Sent: 09/11/2018   3:05 PM EDT To: Lauraine Rinne, NP Subject: CT Super D                                     Ms. Flesch cancelled her CT appointment on Friday 09/13/2018 and I worked her into today's schedule. She states she was told by scheduling that we would get her at exactly 2pm, she was a work in which means we get her as soon as possible, and she left about 2:30 because she was having an anxiety attack from being in the hospital due to her condition. I called her as soon as I found out she had left without getting her CT and she rescheduled for Monday, 09/16/2018 @ 7am. She states that there was no hurry to get scan done. I tried to get her to come back while she was still out but she refused. Just wanted to let you know what was going on. Please call me at [phone] if you have any questions.

## 2018-09-11 NOTE — Telephone Encounter (Signed)
Although I understand what the patient is trying to do as far as limiting her driving distances.  This is simply not possible.Patient CT needs to be obtained when read by radiologist and also burned onto a CD disc with Dr. Lamonte Sakai to review.  CT needs to be obtained at least 72 hours prior to her 09/23/2018 office visit.   Wyn Quaker FNP

## 2018-09-12 ENCOUNTER — Other Ambulatory Visit: Payer: Self-pay

## 2018-09-12 NOTE — Patient Outreach (Signed)
[name]) Care Management  09/12/2018  Rachel [name] 1950/04/22 [id number]   Telephone Screen  Referral Date: 09/10/2018 Referral Source: MD office(Brian Mack,NP) Referral Reason: " med mgmt, high cost meds: Spiriva, Lantus,Ozempic Novolog, glucose monitor, COPD, DM" Insurance:[employer] Medicare   Voicemail message received from patient. Return call placed to patient. Spoke with patient and screening completed. Patient resides in her home alone. She is independent with ADLs/IADLs. She drives herself to medical appts. She denies any recent falls or use of assistive devices. Patient voices that she is taking about 12-14 meds. She went into the coverage gap in May. She voices that her meds are very expensive and she is unable to afford them. Patient reports that she is out of Ozempic which is costing her about $122/month. She also reports financial hardship affording Novolog, Lantus and Spirivia. She states that MD office gave her a sample of inhaler. Patient states that she uses [employer] diabetic supplies and her supplies are extremely costly and she is unable to afford them. She states that she has been a diabetic for 23 yrs and knows how to manage her Diabetes. She also has PMH of smoker and COPD per notes. Patient denies needing any further education and support on chronic conditions. [facility] services reviewed and discussed with patient. Verbal consent for pharmacy assistance obtained.      Plan: RN CM will send [facility] referral for possible med assistance and polypharmacy med review.   Enzo Montgomery, RN,BSN,CCM [place] Management Telephonic Care Management Coordinator Direct Phone: [phone] Toll Free: [phone] Fax: [phone]

## 2018-09-13 ENCOUNTER — Ambulatory Visit (HOSPITAL_COMMUNITY): Admission: RE | Admit: 2018-09-13 | Payer: Medicare HMO | Source: Ambulatory Visit

## 2018-09-13 NOTE — Telephone Encounter (Signed)
LMTCB x1 for pt.  

## 2018-09-16 ENCOUNTER — Telehealth: Payer: Self-pay | Admitting: Pulmonary Disease

## 2018-09-16 ENCOUNTER — Encounter: Payer: Self-pay | Admitting: *Deleted

## 2018-09-16 ENCOUNTER — Ambulatory Visit (HOSPITAL_COMMUNITY): Admission: RE | Admit: 2018-09-16 | Payer: Medicare HMO | Source: Ambulatory Visit

## 2018-09-16 NOTE — Telephone Encounter (Signed)
LMTCB and will hold in triage to f/u on

## 2018-09-16 NOTE — Telephone Encounter (Signed)
09/16/2018 1655  Triage,  Please contact the patient let her know that we were notified that she no showed to her CT imaging appointment.   This is concerning because the patient as discussed needs CT chest COPD.  Dr. Lamonte Sakai has made explicitly clear he would like for this imaging to be completed prior to her upcoming office visit with Dr. Lamonte Sakai.  Please reach out to the patient and figure out why she missed her appointment today.  We likely will need to get the patient rescheduled but she also needs to be understanding that when she no-shows to appointments with imaging she is taking a slot away from other people who also need the same imaging this is a limited resource and she needs to be cognizant of that.  We are trying to help her with her work-up as this imaging is already delayed as it was initially intended to be completed in March/2020.  Wyn Quaker, FNP

## 2018-09-16 NOTE — Telephone Encounter (Signed)
-----   Message from Sammuel Bailiff sent at 09/16/2018  8:01 AM EDT ----- Regarding: RE: CT Super D Mrs. Eland did not show for her CT this am. I had her scheduled at 7am, which we technically do not have appointments at that time, but I put here there so that she would not have to wait in the waiting room and I would be here to personally take care of her. Just wanted to let you know what happened. Thanks, Gaetana Michaelis ----- Message ----- From: Lauraine Rinne, NP Sent: 09/11/2018   3:19 PM EDT To: Sammuel Bailiff, Valerie Salts, CMA Subject: RE: CT Super D                                 So sorry that this happened.   There definitely is urgency and need for the scan.   We will work to contact her.  Cherina,  Can you please turn this into a telephone note and have triage contact the patient. She needs to complete her Super D CT chest ASAP and before her follow up with RB in 2 weeks.   If she has additional concerns she can be put on my schedule tomm as a video visit   Aaron Edelman ----- Message ----- From: Sammuel Bailiff Sent: 09/11/2018   3:05 PM EDT To: Lauraine Rinne, NP Subject: CT Super D                                     [name] cancelled her CT appointment on Friday 09/13/2018 and I worked her into today's schedule. She states she was told by scheduling that we would get her at exactly 2pm, she was a work in which means we get her as soon as possible, and she left about 2:30 because she was having an anxiety attack from being in the hospital due to her condition. I called her as soon as I found out she had left without getting her CT and she rescheduled for Monday, 09/16/2018 @ 7am. She states that there was no hurry to get scan done. I tried to get her to come back while she was still out but she refused. Just wanted to let you know what was going on. Please call me at [phone] if you have any questions.

## 2018-09-18 NOTE — Telephone Encounter (Signed)
lmtcb

## 2018-09-19 ENCOUNTER — Other Ambulatory Visit: Payer: Self-pay

## 2018-09-19 ENCOUNTER — Other Ambulatory Visit: Payer: Self-pay | Admitting: Pharmacist

## 2018-09-19 DIAGNOSIS — R918 Other nonspecific abnormal finding of lung field: Secondary | ICD-10-CM

## 2018-09-19 NOTE — Telephone Encounter (Signed)
I will contact central scheduling to see if it's possible to get this re-scheduled.

## 2018-09-19 NOTE — Telephone Encounter (Signed)
Pt called stating her CT had not been scheduled and has a follow-up appt on Monday.  See previous notes.  Pt's number is [id number]

## 2018-09-19 NOTE — Telephone Encounter (Signed)
It looks like this patient had a CT Super D w/o contrast ordered on 09/10/18 by Wyn Quaker, NP. I looked at the "appointment" tab and did not see anything noted.   Based on the patient response below that was noted by call intake at front desk, not sure if this can be set up and resulted before her appointment on 09/23/18.  Can you see what you can find out and if this can be done? Any help you can give would be appreciated greatly.  Message above routed to [facility], [facility] for follow up.

## 2018-09-19 NOTE — Telephone Encounter (Signed)
Pt is scheduled at [facility] on 8/17 at 11:30, check in by 11:15, no prep.  Spoke to pt who assures me she will go to this appointment.  Pt aware she will be seeing RB that afternoon at 2:30.    Sent skype to Aaron Edelman to get a new STAT Super D CT ordered for this.  Disc will be sent with pt after the CT.  Nothing further needed at this time.

## 2018-09-20 ENCOUNTER — Other Ambulatory Visit: Payer: Self-pay | Admitting: Pharmacy Technician

## 2018-09-20 NOTE — Patient Outreach (Signed)
[facility]) Care Management  09/20/2018  ARLIN SAVONA Apr 24, 1950 [id number]                          Medication Assistance Referral  Referral From: [facility] Jenne Pane  Medication/Company: Stann Ore / [employer] Patient application portion:  Mailed Provider application portion: Interoffice Mailed to Dr. Franco Collet  Medication/Company: Proventil HFA / [employer] Patient application portion:  Mailed Provider application portion: Interoffice Mailed to to Dr. Lamonte Sakai  Medication/Company: Larna Daughters / [employer] Patient application portion:  Education officer, museum portion: Faxed  to Dr. Kelton Pillar  Medication/Company: Lantus Solostar / [employer] Patient application portion:  Education officer, museum portion: Faxed  to Dr. Kelton Pillar  Follow up:  Will follow up with patient in 7-10 business days to confirm application(s) have been received.  Maud Deed Chana Bode [place] Certified Pharmacy Technician [place] Management Direct Dial:[phone]

## 2018-09-23 ENCOUNTER — Inpatient Hospital Stay: Admission: RE | Admit: 2018-09-23 | Payer: Medicare HMO | Source: Ambulatory Visit

## 2018-09-23 ENCOUNTER — Telehealth: Payer: Self-pay | Admitting: Pulmonary Disease

## 2018-09-23 ENCOUNTER — Other Ambulatory Visit: Payer: Medicare HMO

## 2018-09-23 ENCOUNTER — Ambulatory Visit: Payer: Medicare HMO | Admitting: Emergency Medicine

## 2018-09-23 ENCOUNTER — Ambulatory Visit (HOSPITAL_COMMUNITY): Payer: Medicare HMO

## 2018-09-23 NOTE — Telephone Encounter (Signed)
09/23/2018 0902  Triage,  Please contact the patient.  It looks like the patient has rescheduled her super D CT chest that we had coordinated multiple times to get her to have completed prior to her previously scheduled appointment on 09/23/2018 with Dr. Lamonte Sakai.  It looks like the patient has canceled that ov with RB.  Looks like the patient has been rescheduled with radiology for later on this month.  Patient needs to have a follow-up scheduled with Dr. Lamonte Sakai after completion of super D CT chest.  Please also remind the patient that every time that she changes the schedule this causes multiple people have to recoordinate their work schedule.  There are a lot of people that are working to try to help take care of her such as coordinating appointments, CT scans, making sure the disc is delivered to our office.  When she reschedules appointment she needs to notify our office so that way we can make sure she has appropriate care.  Wyn Quaker FNP

## 2018-09-23 NOTE — Telephone Encounter (Signed)
Pt is scheduled w/ RB on 8./27 @ 1:30.  Nothing further needed at this time.

## 2018-09-25 ENCOUNTER — Ambulatory Visit: Payer: Medicare HMO | Admitting: Emergency Medicine

## 2018-09-30 NOTE — Patient Outreach (Signed)
[facility]) Care Management  [place]   09/19/2018  Rachel [name] October 30, 1950 [id number]  Reason for referral: Medication Assistance  Referral source: [facility] RN Current insurance: [employer]  PMHx includes but not limited to:  T2DM, COPD, HLD, hyopthyroidism  Outreach:  Successful telephone call with Rachel [name].  HIPAA identifiers verified.   Current Barriers:  . Financial Barriers: patient has [employer] and reports copays for Spiriva, Ozempic, Lantus are cost prohibitive at this time  Interventions: . Comprehensive medication review completed; medication list updated in electronic medical record. Patient is no longer taking Novolog.  Her most recent A1c is 8.0.  She has been uncontrolled due to financial reasons. Patient meets income/NO out of pocket spend criteria for this medication's patient assistance program. Reviewed application process. Patient will provide proof of income, out of pocket spend report, and will sign application. Will collaborate with Etter Sjogren, CPhT and specialist providers (pulm-Dr. Baltazar Apo and endocrine-Dr. Kelton Pillar) for their portion of application.   Patient Self Care Activities:  . Patient will provide necessary portions of application    Objective: The 10-year ASCVD risk score Mikey Bussing DC Jr., et al., 2013) is: 21.4%   Values used to calculate the score:     Age: 68 years     Sex: Female     Is Non-Hispanic African American: Yes     Diabetic: Yes     Tobacco smoker: Yes     Systolic Blood Pressure: 741 mmHg     Is BP treated: No     HDL Cholesterol: 52 mg/dL     Total Cholesterol: 132 mg/dL  Lab Results  Component Value Date   CREATININE 0.97 04/05/2018   CREATININE 1.42 (H) 03/13/2018   CREATININE 1.79 (H) 03/12/2018    Lab Results  Component Value Date   HGBA1C 8.0 (H) 11/13/2017    Lipid Panel     Component Value Date/Time   CHOL 132 12/07/2017 1015   TRIG 93 12/07/2017 1015    HDL 52 12/07/2017 1015   CHOLHDL 2.5 12/07/2017 1015   CHOLHDL 3.6 04/18/2014 0833   VLDL 23 04/18/2014 0833   LDLCALC 61 12/07/2017 1015    BP Readings from Last 3 Encounters:  09/10/18 122/70  07/29/18 138/76  04/23/18 (!) 150/87    Allergies  Allergen Reactions  . Altace [Ramipril] Cough  . Biaxin [Clarithromycin] Other (See Comments)    Fatigue  . Niacin And Related Other (See Comments)    Flush - felt like on fire.  . Nsaids Other (See Comments)    feverish  . Tramadol Other (See Comments)    Lethargic   . Adhesive [Tape] Rash and Other (See Comments)    Paper tape is ok    Medications Reviewed Today    Reviewed by Lavera Guise, [facility] (Pharmacist) on 09/19/18 at [place] List Status: <None>  Medication Order Taking? Sig Documenting Provider Last Dose Status Informant  albuterol (PROAIR HFA) 108 (90 Base) MCG/ACT inhaler [id number] Yes Inhale 2 puffs into the lungs every 4 (four) hours as needed for wheezing or shortness of breath. Please supply 3 months. Lauraine Rinne, NP Taking Active   aspirin EC 81 MG tablet [id number] Yes Take 81 mg by mouth daily.   [provider] Taking Active Child  clopidogrel (PLAVIX) 75 MG tablet [id number] Yes Take 1 tablet (75 mg total) by mouth daily. [name], MD Taking Active   Continuous Blood Gluc Receiver (FREESTYLE LIBRE READER)  DEVI [id number] Yes 1 kit by Does not apply route 4 (four) times daily. Shamleffer, Melanie Crazier, MD Taking Active   Continuous Blood Gluc Sensor (FREESTYLE LIBRE [place]) [place] [id number]  1 Device by Does not apply route as directed. Shamleffer, Melanie Crazier, MD  Active   fluconazole (DIFLUCAN) 150 MG tablet [id number] Yes Take one tablet 3 days apart Mikey Kirschner, MD Taking Active   fluticasone [facility]) 50 MCG/ACT nasal spray [id number] Yes Place 2 sprays into both nostrils daily. Collene Gobble, MD Taking Active   gabapentin (NEURONTIN) 400 MG capsule [id number] Yes TAKE 1 CAPSULE  BY MOUTH AT BEDTIME AS DIRECTED Mikey Kirschner, MD Taking Active   Insulin Glargine (LANTUS SOLOSTAR) 100 UNIT/ML Solostar Pen [id number] Yes Inject 24 Units into the skin daily. Shamleffer, Melanie Crazier, MD Taking Active   levothyroxine (SYNTHROID, LEVOTHROID) 100 MCG tablet [id number] Yes TAKE 1 TABLET BY MOUTH EVERY DAY BEFORE BREAKFAST  Patient taking differently: Take 100 mcg by mouth daily before breakfast.    Cassandria Anger, MD Taking Active Child  meclizine (ANTIVERT) 25 MG tablet [id number] Yes Take 1 tablet (25 mg total) by mouth 3 (three) times daily as needed for dizziness. Mikey Kirschner, MD Taking Active Child  metFORMIN (GLUCOPHAGE) 1000 MG tablet [id number] Yes Take 1 tablet (1,000 mg total) by mouth daily with breakfast. Shamleffer, Melanie Crazier, MD Taking Active   pantoprazole (PROTONIX) 40 MG tablet [id number] Yes Take 1 tablet (40 mg total) by mouth 2 (two) times daily before a meal. Mahala Menghini, PA-C Taking Active   rosuvastatin (CRESTOR) 40 MG tablet [id number] Yes TAKE 1 TABLET AT BEDTIME Mikey Kirschner, MD Taking Active   Semaglutide,0.25 or 0.5MG/DOS, (OZEMPIC, 0.25 OR 0.5 MG/DOSE,) 2 MG/1.5ML SOPN [id number] Yes Inject 0.5 mg into the skin once a week. Shamleffer, Melanie Crazier, MD Taking Active   tiotropium [facility]) 18 MCG inhalation capsule [id number] Yes Place 1 capsule (18 mcg total) into inhaler and inhale daily. Collene Gobble, MD Taking Active Child  umeclidinium bromide (INCRUSE ELLIPTA) 62.5 MCG/INH AEPB [id number]  Inhale 1 puff into the lungs daily. Lauraine Rinne, NP  Active           Plan: . I will route patient assistance letter to [place] technician who will coordinate patient assistance program application process for medications listed above.  [facility] pharmacy technician will assist with obtaining all required documents from both patient and provider(s) and submit application(s) once completed.    Regina Eck, PharmD,  [address]  [phone]

## 2018-10-02 ENCOUNTER — Ambulatory Visit (INDEPENDENT_AMBULATORY_CARE_PROVIDER_SITE_OTHER)
Admission: RE | Admit: 2018-10-02 | Discharge: 2018-10-02 | Disposition: A | Discharge status: Home / Self Care | Payer: Medicare HMO | Source: Ambulatory Visit | Attending: Pulmonary Disease | Admitting: Pulmonary Disease

## 2018-10-02 ENCOUNTER — Other Ambulatory Visit: Payer: Self-pay

## 2018-10-02 ENCOUNTER — Other Ambulatory Visit: Payer: Self-pay | Admitting: Pharmacy Technician

## 2018-10-02 DIAGNOSIS — R918 Other nonspecific abnormal finding of lung field: Secondary | ICD-10-CM

## 2018-10-02 DIAGNOSIS — J439 Emphysema, unspecified: Secondary | ICD-10-CM | POA: Diagnosis not present

## 2018-10-02 DIAGNOSIS — E1142 Type 2 diabetes mellitus with diabetic polyneuropathy: Secondary | ICD-10-CM | POA: Diagnosis not present

## 2018-10-02 NOTE — Patient Outreach (Signed)
Rachel [name] [facility]) Care Management  10/02/2018  Rachel [name] [birth year] [id number]    Incoming call from patient regarding patient assistance application(s) for Lantus, Ozempic, Proventil HFA and Spiriva  , HIPAA identifiers verified. [name] verifies that she received patient assistance applications. Reviewed applications with her and she states she will mail them back in ASAP.  Follow up:  Will submit to companies once all documents have been obtained.  Maud Deed Chana Bode [place] Certified Pharmacy Technician [place] Management Direct Dial:[phone]

## 2018-10-03 ENCOUNTER — Ambulatory Visit (INDEPENDENT_AMBULATORY_CARE_PROVIDER_SITE_OTHER): Payer: Medicare HMO | Admitting: Emergency Medicine

## 2018-10-03 ENCOUNTER — Encounter: Payer: Self-pay | Admitting: Emergency Medicine

## 2018-10-03 DIAGNOSIS — J301 Allergic rhinitis due to pollen: Secondary | ICD-10-CM

## 2018-10-03 DIAGNOSIS — K219 Gastro-esophageal reflux disease without esophagitis: Secondary | ICD-10-CM

## 2018-10-03 DIAGNOSIS — J449 Chronic obstructive pulmonary disease, unspecified: Secondary | ICD-10-CM | POA: Diagnosis not present

## 2018-10-03 DIAGNOSIS — R918 Other nonspecific abnormal finding of lung field: Secondary | ICD-10-CM | POA: Diagnosis not present

## 2018-10-03 DIAGNOSIS — F17200 Nicotine dependence, unspecified, uncomplicated: Secondary | ICD-10-CM | POA: Diagnosis not present

## 2018-10-03 MED ORDER — NICOTINE 7 MG/24HR TD PT24: 7.0000 mg | MEDICATED_PATCH | Freq: Every day | TRANSDERMAL | 0 refills | Status: DC

## 2018-10-03 NOTE — Progress Notes (Signed)
Reviewed with patient out of office visit today with Dr. Lamonte Sakai.  Nothing further needed.  Wyn Quaker, FNP

## 2018-10-03 NOTE — Assessment & Plan Note (Signed)
Most of her pulmonary nodules have resolved. The remaining LUL nodule is stable in size and appearance. OK to resume LDCT's 6 months following this scan.

## 2018-10-03 NOTE — Assessment & Plan Note (Signed)
--   continue protonix  ?

## 2018-10-03 NOTE — Assessment & Plan Note (Signed)
Overall stable, no exacerbations. She preferred Spiriva to Krebs to switch this back if shge can get insurance approval or get it through [employer] and wellness.  Unfortunately increased her tobacco since last time. Wants to quit again.

## 2018-10-03 NOTE — Patient Instructions (Addendum)
We will plan to repeat your CT scan of the chest in February as part of the lung cancer screening program Agree with your plans to stop smoking.  We will order nicotine patches to help decrease cravings as you stop. Continue Incruse for now once daily.  If you are able to get Spiriva through community health and wellness then we can switch the Incruse back to that medication. Keep albuterol available use 2 puffs if needed for shortness of breath, chest tightness, wheezing.  You can take this 10 minutes before exertion to see if it makes your exercise easier, more effective. Continue Flonase nasal spray as you have been taking it. Continue Protonix as you have been taking it. Follow with Dr Lamonte Sakai in 6 months or sooner if you have any problems

## 2018-10-03 NOTE — Progress Notes (Signed)
Subjective:    Patient ID: Rachel [name], female    DOB: 1950/12/22, 68 y.o.   MRN: [id number]  COPD She complains of cough and shortness of breath. There is no wheezing. Associated symptoms include appetite change, sneezing and trouble swallowing. Pertinent negatives include no ear pain, fever, headaches, postnasal drip, rhinorrhea or sore throat. Her past medical history is significant for COPD.   68 year old smoker (50 pack years) with a history of hypertension, coronary artery disease, peripheral vascular disease, diabetes, allergic rhinitis.  She is referred today by Dr. Wolfgang Phoenix for evaluation of persistent cough, dyspnea and probable COPD.  She reports that she started to notice exertional SOB with previously easy tasks. She has had a worsening of a chronic cough and sinus drainage, hoarse voice. She has controlled GERD, has some occasional dysphagia. She has  A lot of nasal congestion.   Pulmonary function testing was done 03/29/17 and I have reviewed.  This shows moderate obstruction with out a bronchodilator response, normal lung volumes, severely decreased diffusion capacity that does not correct when adjusted for alveolar volume.  ROV 06/26/17 --this is a follow-up visit for patient with a history of tobacco use and COPD with moderate obstruction noted on pulmonary function testing.  At her last visit we talked about smoking cessation and also started her on Spiriva Respimat to see if she would benefit.  A walking oximetry did not show any desaturation at that visit. She believes her breathing is better. She still has a lot of nasal drainage and congestion. She has albuterol, uses it about . Smoking about 5 cigarettes a day. She has seen S [facility] regarding LDCT, planning to start medicare in Sept and then scan in Oct.   ROV 01/24/18 --Rachel [name] is 63, has COPD and history of tobacco use.  Also nasal congestion and allergic rhinitis, GERD.  We are managing her on Spiriva Respimat.  She  uses albuterol approximately 1-2x a month.  She has been working on decreasing her smoking and is currently at 3 cigarettes per day.  She underwent a low-dose CT scan for lung cancer screening on 01/09/2018 and I have reviewed.  This shows multiple bilateral nodules that measure up to 11 mm in the left upper lobe with some adjacent ill-defined groundglass. We treated her empirically with doxycycline. She had less mucous and cough while she was on it. She is on flonase, sometimes uses bid. Not on anti-histamine - hasn' t really benefited before. She has never done [place]. Cough is her most bothersome sx right now.   ROV 10/03/2018 --this a follow-up visit for history of tobacco use, COPD and allergic rhinitis, GERD with associated cough.  She also has a multiple bilateral pulmonary nodules that we have followed on lung cancer screening CTs.  She underwent a super D CT chest on 8/26 which I have reviewed.  This shows large-scale resolution of multiple pulmonary nodules in the interim compared with 01/09/2018.  She does have an irregular nodular lesion in the left upper lobe that is unchanged in size or appearance. She has been smoking 1pk/day lately. She wants to start. She is on Incruse - isn't sure whether it is helping her. She preferred Spiriva. Remains on remains on flonase, protonix. Uses albuterol rarely.       Review of Systems  Constitutional: Positive for appetite change. Negative for fever and unexpected weight change.  HENT: Positive for congestion, sneezing, trouble swallowing and voice change. Negative for dental problem, ear pain, nosebleeds, postnasal  drip, rhinorrhea, sinus pressure and sore throat.   Eyes: Negative for redness and itching.  Respiratory: Positive for cough and shortness of breath. Negative for chest tightness and wheezing.   Cardiovascular: Negative for palpitations and leg swelling.  Gastrointestinal: Negative for nausea and vomiting.  Genitourinary: Negative for dysuria.   Musculoskeletal: Negative for joint swelling.  Skin: Negative for rash.  Neurological: Negative for headaches.  Hematological: Does not bruise/bleed easily.  Psychiatric/Behavioral: Negative for dysphoric mood. The patient is not nervous/anxious.    Past Medical History:  Diagnosis Date  . Asthma   . Coronary atherosclerosis of native coronary artery    DES x 2 (overlapping) RCA 03/2008 - patent 2011  . Diabetes type 2, controlled ([place]) 2000  . Essential hypertension, benign   . Fatty liver   . GERD (gastroesophageal reflux disease)   . Hyperlipidemia   . Microalbuminuria   . Obesity   . Odynophagia    Severe esophagitis on CT 05/2010, treated empirically with Diflucan  . PAD (peripheral artery disease) (HCC)    history of left greater than right lower 70 claudication  . Tubular adenoma of colon 07/2007   Multiple colonic polyps  . Venous stasis      Family History  Problem Relation Age of Onset  . Bone cancer Father   . Heart disease Maternal Grandmother   . Hypertension Mother   . Diabetes Mother   . Colon cancer Neg Hx   . Liver disease Neg Hx   . Inflammatory bowel disease Neg Hx      Social History   Socioeconomic History  . Marital status: Divorced    Spouse name: Not on file  . Number of children: 1  . Years of education: Not on file  . Highest education level: Not on file  Occupational History  . Occupation: retired  Scientific laboratory technician  . Financial resource strain: Not on file  . Food insecurity    Worry: Not on file    Inability: Not on file  . Transportation needs    Medical: Not on file    Non-medical: Not on file  Tobacco Use  . Smoking status: Current Every Day Smoker    Packs/day: 1.00    Years: 50.00    Pack years: 50.00    Types: Cigarettes  . Smokeless tobacco: Never Used  . Tobacco comment: 0.5ppd today   Substance and Sexual Activity  . Alcohol use: No  . Drug use: No  . Sexual activity: Yes    Partners: Male    Birth control/protection:  Condom    Comment: mutual friends  Lifestyle  . Physical activity    Days per week: Not on file    Minutes per session: Not on file  . Stress: Not on file  Relationships  . Social Herbalist on phone: Not on file    Gets together: Not on file    Attends religious service: Not on file    Active member of club or organization: Not on file    Attends meetings of clubs or organizations: Not on file    Relationship status: Not on file  . Intimate partner violence    Fear of current or ex partner: Not on file    Emotionally abused: Not on file    Physically abused: Not on file    Forced sexual activity: Not on file  Other Topics Concern  . Not on file  Social History Narrative  . Not on file  Allergies  Allergen Reactions  . Altace [Ramipril] Cough  . Biaxin [Clarithromycin] Other (See Comments)    Fatigue  . Niacin And Related Other (See Comments)    Flush - felt like on fire.  . Nsaids Other (See Comments)    feverish  . Tramadol Other (See Comments)    Lethargic   . Adhesive [Tape] Rash and Other (See Comments)    Paper tape is ok     Outpatient Medications Prior to Visit  Medication Sig Dispense Refill  . albuterol (PROAIR HFA) 108 (90 Base) MCG/ACT inhaler Inhale 2 puffs into the lungs every 4 (four) hours as needed for wheezing or shortness of breath. Please supply 3 months. 18 g 11  . aspirin EC 81 MG tablet Take 81 mg by mouth daily.      . clopidogrel (PLAVIX) 75 MG tablet Take 1 tablet (75 mg total) by mouth daily. 90 tablet 1  . Continuous Blood Gluc Receiver (FREESTYLE LIBRE READER) DEVI 1 kit by Does not apply route 4 (four) times daily. 1 kit 0  . Continuous Blood Gluc Sensor (FREESTYLE LIBRE 14 DAY SENSOR) MISC 1 Device by Does not apply route as directed. 2 each 12  . fluconazole (DIFLUCAN) 150 MG tablet Take one tablet 3 days apart 2 tablet 0  . fluticasone (FLONASE) 50 MCG/ACT nasal spray Place 2 sprays into both nostrils daily. 48 g 1  .  gabapentin (NEURONTIN) 400 MG capsule TAKE 1 CAPSULE BY MOUTH AT BEDTIME AS DIRECTED 90 capsule 1  . Insulin Glargine (LANTUS SOLOSTAR) 100 UNIT/ML Solostar Pen Inject 24 Units into the skin daily. 15 mL 6  . levothyroxine (SYNTHROID, LEVOTHROID) 100 MCG tablet TAKE 1 TABLET BY MOUTH EVERY DAY BEFORE BREAKFAST (Patient taking differently: Take 100 mcg by mouth daily before breakfast. ) 90 tablet 0  . meclizine (ANTIVERT) 25 MG tablet Take 1 tablet (25 mg total) by mouth 3 (three) times daily as needed for dizziness. 30 tablet 2  . metFORMIN (GLUCOPHAGE) 1000 MG tablet Take 1 tablet (1,000 mg total) by mouth daily with breakfast. 30 tablet 3  . pantoprazole (PROTONIX) 40 MG tablet Take 1 tablet (40 mg total) by mouth 2 (two) times daily before a meal. 180 tablet 3  . rosuvastatin (CRESTOR) 40 MG tablet TAKE 1 TABLET AT BEDTIME 90 tablet 0  . Semaglutide,0.25 or 0.5MG/DOS, (OZEMPIC, 0.25 OR 0.5 MG/DOSE,) 2 MG/1.5ML SOPN Inject 0.5 mg into the skin once a week. 4 pen 11  . umeclidinium bromide (INCRUSE ELLIPTA) 62.5 MCG/INH AEPB Inhale 1 puff into the lungs daily. 4 each 0  . tiotropium (SPIRIVA) 18 MCG inhalation capsule Place 1 capsule (18 mcg total) into inhaler and inhale daily. 30 capsule 6   No facility-administered medications prior to visit.         Objective:   Physical Exam Vitals:   10/03/18 1334  BP: 126/82  Pulse: 100  SpO2: 99%  Weight: 153 lb (69.4 kg)  Height: 5' 3" (1.6 m)    Gen: Pleasant, well-nourished, in no distress,  normal affect  ENT: No lesions,  mouth clear,  oropharynx clear, no postnasal drip, strong voice  Neck: No JVD, no stridor  Lungs: No use of accessory muscles, clear without rales or rhonchi  Cardiovascular: RRR, heart sounds normal, no murmur or gallops, no peripheral edema  Musculoskeletal: No deformities, no cyanosis or clubbing  Neuro: alert, non focal  Skin: Warm, no lesions or rashes      Assessment & Plan:  COPD (chronic  obstructive pulmonary disease) (HCC) Overall stable, no exacerbations. She preferred Spiriva to [place] to switch this back if shge can get insurance approval or get it through [employer] and wellness.  Unfortunately increased her tobacco since last time. Wants to quit again.    Pulmonary nodules Most of her pulmonary nodules have resolved. The remaining LUL nodule is stable in size and appearance. OK to resume LDCT's 6 months following this scan.   TOBACCO ABUSE She went back to 1 pk/day. She is motivated to quit. We discussed strategies today. Will start nicotine patches to assist with the transition to off.   Allergic rhinitis Continue flonase   GERD (gastroesophageal reflux disease) continue protonix   Baltazar Apo, MD, PhD 10/03/2018, 4:14 PM [facility] [phone] or if no answer [phone]

## 2018-10-03 NOTE — Assessment & Plan Note (Signed)
She went back to 1 pk/day. She is motivated to quit. We discussed strategies today. Will start nicotine patches to assist with the transition to off.

## 2018-10-03 NOTE — Assessment & Plan Note (Signed)
Continue flonase 

## 2018-10-09 ENCOUNTER — Other Ambulatory Visit: Payer: Self-pay

## 2018-10-09 ENCOUNTER — Ambulatory Visit (INDEPENDENT_AMBULATORY_CARE_PROVIDER_SITE_OTHER): Payer: Medicare HMO | Admitting: Nurse Practitioner

## 2018-10-09 ENCOUNTER — Encounter: Payer: Self-pay | Admitting: Nurse Practitioner

## 2018-10-09 VITALS — BP 141/79 | HR 84 | Temp 97.1°F | Ht 63.0 in | Wt 155.8 lb

## 2018-10-09 DIAGNOSIS — K5792 Diverticulitis of intestine, part unspecified, without perforation or abscess without bleeding: Secondary | ICD-10-CM

## 2018-10-09 DIAGNOSIS — K5900 Constipation, unspecified: Secondary | ICD-10-CM

## 2018-10-09 MED ORDER — LINACLOTIDE 72 MCG PO CAPS: 72.0000 ug | ORAL_CAPSULE | Freq: Every day | ORAL | 1 refills | Status: AC

## 2018-10-09 NOTE — Addendum Note (Signed)
Addended by: Gordy Levan, ERIC A on: 10/09/2018 11:18 AM   Modules accepted: Orders

## 2018-10-09 NOTE — Assessment & Plan Note (Signed)
Worsening constipation, likely element of IBS C.  The patient has been under significant stress needing to place her sister in an adult care child for worsening dementia.  Dealing with social services has been very taxing.  Her blood pressure is mildly elevated today.  She has had worsening abdominal pain and constipation with hard stools, infrequent stools, incomplete emptying and associated abdominal pain that typically resolves when she does have a decent bowel movement.  She is currently taking fiber supplement and this is not working well enough.  At this point I will trial her on Linzess 72 mcg.  Unfortunately we do not have samples due to the COVID-19 pandemic and limited access to drug rep.  I will send in a limited 30-day supply to her pharmacy for trial.  Call for any issues affording the medication.  Call with a progress report in 1 to 2 weeks if she is able to trial.  If she has good result we can send in a longer-term prescription.  If she does not have a good response we can adjust dosage for try other options.  Follow-up in 3 months.

## 2018-10-09 NOTE — Patient Instructions (Signed)
Your health issues we discussed today were:   Constipation and abdominal pain: 1. Unfortunately we do not have samples of the medication 2. I will send a prescription to [facility] for Linzess 72 mcg.  Take this once a day, on empty stomach, first thing in the morning 3. Call us if he has any issues with insurance coverage 4. If you are able to obtain the medication, call us in 1 to 2 weeks and let us know if it is helping you have better bowel movements 5. Further recommendations to follow your result with Linzess  Smoking cessation: 1. I am glad you have decided to quit smoking! 2. I have printed out a brochure for the [place] quit line to help you in your journey toward quitting smoking  Overall I recommend:  1. Continue your other current medications 2. Call us if you have any questions or concerns 3. Follow-up in 3 months   Because of recent events of COVID-19 ("Coronavirus"), follow CDC recommendations:  Wash your hand frequently Avoid touching your face Stay away from people who are sick If you have symptoms such as fever, cough, shortness of breath then call your healthcare provider for further guidance If you are sick, STAY AT HOME unless otherwise directed by your healthcare provider. Follow directions from state and national officials regarding staying safe   At [facility] we value your feedback. You may receive a survey about your visit today. Please share your experience as we strive to create trusting relationships with our patients to provide genuine, compassionate, quality care.  We appreciate your understanding and patience as we review any laboratory studies, imaging, and other diagnostic tests that are ordered as we care for you. Our office policy is 5 business days for review of these results, and any emergent or urgent results are addressed in a timely manner for your best interest. If you do not hear from our office in 1 week, please contact us.    We also encourage the use of MyChart, which contains your medical information for your review as well. If you are not enrolled in this feature, an access code is on this after visit summary for your convenience. Thank you for allowing [place] to be involved in your care.  It was great to see you today!  I hope you have a great summer!!

## 2018-10-09 NOTE — Progress Notes (Signed)
Referring Provider: Mikey Kirschner, MD Primary Care Physician:  Mikey Kirschner, MD Primary GI:  Dr. Gala Romney  Chief Complaint  Patient presents with  . Diverticulitis  . Constipation    NOT DOING WELL, PT TAKES FIBER TABS     HPI:   Rachel [name] is a 68 y.o. female who presents for follow-up on diverticulitis and constipation.  The patient was last seen in our office 04/23/2018 for the same.  Chronic history of dysphasia and anemia.  Previous episodes of hospitalization for profound anemia requiring transfusions.  History of GERD and insidiously recurrent esophageal dysphagia to solid food.  Colonoscopy up-to-date 02/14/2018 which found tubular adenoma and recommended repeat colonoscopy in 3 years (2023).  EGD the same day with mild erosive reflux esophagitis, erosive gastropathy found to be chronic inactive gastritis.  Hospital admission from 03/10/2018 through 03/13/2022 acute diverticulitis treated with Rocephin and Flagyl with discharged p.o. Augmentin for 7 days.  Also noted apparently new acute kidney injury with admission creatinine 2.03 and declined to 1.43-day of discharge.  Follow-up labs at the end of February found normalization of hemoglobin at 12.5 and creatinine of 0.97.  At her last visit she noted to be still on Protonix twice daily without GERD.  Swallowing doing well after dilation.  Abdominal pain 2 to 3 days prior that lasted a couple hours and seemed to resolve after bowel movement.  Noted constipation and straining but stools not hard.  Has "multiple pellets" and will occasionally go multiple days without a bowel movement.  She is unsure what to do for diet related to diverticulitis.  She eats adequate fiber and notes a lot of fruit and vegetables.  She was provided voluminous information related to diet and general information about diverticulitis.  Recommended adding a daily fiber supplement, follow-up in 4 months.  Today she states she's doing ok. No recent  diverticulitis flare. She is under a lot of stress because she has had to put her sister with dementia in an adult care home and isn't able to talk to her as much as they used to. Her sister's dementia was getting worse and the patient hasn't been able to care for her well enough and made the tough decision to have her placed for safety and care. Her constipation has been worse. Her stress has been mostly due to dealing with social services. With the stress her BP has been a bit higher. She is taking a fiber supplement. She has a bowel movement as little as once a week. Has hard stools, straining, and incomplete emptying. Is not taking anything else OTC. Associated abdominal pain which is mild to moderate, typically resolves after a bowel movement. Denies hematochezia, melena, fever, chills, unintentional weight loss. Denies URI or flu-like symptoms. Denies loss of sense of taste or smell. Denies chest pain, dyspnea, dizziness, lightheadedness, syncope, near syncope. Denies any other upper or lower GI symptoms.  Past Medical History:  Diagnosis Date  . Asthma   . Coronary atherosclerosis of native coronary artery    DES x 2 (overlapping) RCA 03/2008 - patent 2011  . Diabetes type 2, controlled ([place]) 2000  . Essential hypertension, benign   . Fatty liver   . GERD (gastroesophageal reflux disease)   . Hyperlipidemia   . Microalbuminuria   . Obesity   . Odynophagia    Severe esophagitis on CT 05/2010, treated empirically with Diflucan  . PAD (peripheral artery disease) (HCC)    history of left greater than  right lower 70 claudication  . Tubular adenoma of colon 07/2007   Multiple colonic polyps  . Venous stasis     Past Surgical History:  Procedure Laterality Date  . BIOPSY  02/14/2018   Procedure: BIOPSY;  Surgeon: Daneil Dolin, MD;  [facility];  Service: Endoscopy;;  gastric bx's  . CARDIAC CATHETERIZATION     stents  . CHOLECYSTECTOMY N/A 07/30/2015   Procedure: LAPAROSCOPIC  CHOLECYSTECTOMY;  Surgeon: Aviva Signs, MD;  Location: [facility];  Service: General;  Laterality: N/A;  . COLONOSCOPY W/ POLYPECTOMY  07/2007   Tubular adenoma  . COLONOSCOPY WITH PROPOFOL N/A 02/14/2018   Dr. Gala Romney: diverticulosis in sigmoid and descending colon, three 4-6 mm polyps in descending colon and ascending, tubular adenomas  . CORONARY STENT PLACEMENT    . CYSTECTOMY     Pilonidal  . ESOPHAGOGASTRODUODENOSCOPY  04/2010   RMR: Normal esophagus , Stomach D1 and D2   . ESOPHAGOGASTRODUODENOSCOPY (EGD) WITH PROPOFOL N/A 02/14/2018   Dr. Gala Romney: mild erosive reflux esophagitis, s/p dilatation, erosive gastropathy s/p biopsy, chronic inactive gastritis, negative H.pylori  . LOWER EXTREMITY ANGIOGRAPHY N/A 03/13/2016   Procedure: Lower Extremity Angiography;  Surgeon: [name], MD;  Location: [place] [facility];  Service: Cardiovascular;  Laterality: N/A;  . Venia Minks DILATION N/A 02/14/2018   Procedure: Venia Minks DILATION;  Surgeon: Daneil Dolin, MD;  [facility];  Service: Endoscopy;  Laterality: N/A;  . PERIPHERAL VASCULAR ATHERECTOMY  03/27/2016   Procedure: Peripheral Vascular Atherectomy;  Surgeon: [name], MD;  Location: [place] [facility];  Service: Cardiovascular;;  Attempted atherectomy  . PERIPHERAL VASCULAR CATHETERIZATION Bilateral 09/28/2014   Procedure: Lower Extremity Angiography;  Surgeon: [name], MD;  Location: [place] [facility];  Service: Cardiovascular;  Laterality: Bilateral;  . PERIPHERAL VASCULAR CATHETERIZATION N/A 09/28/2014   Procedure: Abdominal Aortogram;  Surgeon: [name], MD;  Location: [place] [facility];  Service: Cardiovascular;  Laterality: N/A;  . PERIPHERAL VASCULAR CATHETERIZATION N/A 10/15/2014   Procedure: Lower Extremity Angiography;  Surgeon: [name], MD;  Location: [place] [facility];  Service: Cardiovascular;  Laterality: N/A;  . PERIPHERAL VASCULAR CATHETERIZATION  10/15/2014   Procedure: Peripheral Vascular  Intervention;  Surgeon: [name], MD;  Location: [place] [facility];  Service: Cardiovascular;;  bilateral iliac stents  . PILONIDAL CYST / SINUS EXCISION    . POLYPECTOMY  02/14/2018   Procedure: POLYPECTOMY;  Surgeon: Daneil Dolin, MD;  [facility];  Service: Endoscopy;;  ascending colon polyps x2, descending polyps x3  . TUBAL LIGATION Bilateral     Current Outpatient Medications  Medication Sig Dispense Refill  . albuterol (PROAIR HFA) 108 (90 Base) MCG/ACT inhaler Inhale 2 puffs into the lungs every 4 (four) hours as needed for wheezing or shortness of breath. Please supply 3 months. 18 g 11  . aspirin EC 81 MG tablet Take 81 mg by mouth daily.      . clopidogrel (PLAVIX) 75 MG tablet Take 1 tablet (75 mg total) by mouth daily. 90 tablet 1  . Continuous Blood Gluc Receiver (FREESTYLE LIBRE READER) DEVI 1 kit by Does not apply route 4 (four) times daily. 1 kit 0  . Continuous Blood Gluc Sensor (FREESTYLE LIBRE 14 DAY SENSOR) MISC 1 Device by Does not apply route as directed. 2 each 12  . fluticasone (FLONASE) 50 MCG/ACT nasal spray Place 2 sprays into both nostrils daily. 48 g 1  .  gabapentin (NEURONTIN) 400 MG capsule TAKE 1 CAPSULE BY MOUTH AT BEDTIME AS DIRECTED 90 capsule 1  . Insulin Glargine (LANTUS SOLOSTAR) 100 UNIT/ML Solostar Pen Inject 24 Units into the skin daily. 15 mL 6  . levothyroxine (SYNTHROID, LEVOTHROID) 100 MCG tablet TAKE 1 TABLET BY MOUTH EVERY DAY BEFORE BREAKFAST (Patient taking differently: Take 100 mcg by mouth daily before breakfast. ) 90 tablet 0  . meclizine (ANTIVERT) 25 MG tablet Take 1 tablet (25 mg total) by mouth 3 (three) times daily as needed for dizziness. 30 tablet 2  . metFORMIN (GLUCOPHAGE) 1000 MG tablet Take 1 tablet (1,000 mg total) by mouth daily with breakfast. 30 tablet 3  . pantoprazole (PROTONIX) 40 MG tablet Take 1 tablet (40 mg total) by mouth 2 (two) times daily before a meal. 180 tablet 3  . rosuvastatin (CRESTOR) 40  MG tablet TAKE 1 TABLET AT BEDTIME 90 tablet 0  . Semaglutide,0.25 or 0.5MG/DOS, (OZEMPIC, 0.25 OR 0.5 MG/DOSE,) 2 MG/1.5ML SOPN Inject 0.5 mg into the skin once a week. 4 pen 11  . umeclidinium bromide (INCRUSE ELLIPTA) 62.5 MCG/INH AEPB Inhale 1 puff into the lungs daily. 4 each 0   No current facility-administered medications for this visit.     Allergies as of 10/09/2018 - Review Complete 10/09/2018  Allergen Reaction Noted  . Altace [ramipril] Cough 06/07/2012  . Biaxin [clarithromycin] Other (See Comments) 06/07/2012  . Niacin and related Other (See Comments) 06/07/2012  . Nsaids Other (See Comments) 10/09/2014  . Tramadol Other (See Comments) 11/06/2017  . Adhesive [tape] Rash and Other (See Comments) 03/22/2016    Family History  Problem Relation Age of Onset  . Bone cancer Father   . Heart disease Maternal Grandmother   . Hypertension Mother   . Diabetes Mother   . Colon cancer Neg Hx   . Liver disease Neg Hx   . Inflammatory bowel disease Neg Hx     Social History   Socioeconomic History  . Marital status: Divorced    Spouse name: Not on file  . Number of children: 1  . Years of education: Not on file  . Highest education level: Not on file  Occupational History  . Occupation: retired  Scientific laboratory technician  . Financial resource strain: Not on file  . Food insecurity    Worry: Not on file    Inability: Not on file  . Transportation needs    Medical: Not on file    Non-medical: Not on file  Tobacco Use  . Smoking status: Current Every Day Smoker    Packs/day: 1.00    Years: 50.00    Pack years: 50.00    Types: Cigarettes  . Smokeless tobacco: Never Used  . Tobacco comment: 0.5ppd today ; Has plans to start quitting smoking with pulm assistance.  Substance and Sexual Activity  . Alcohol use: No  . Drug use: No  . Sexual activity: Yes    Partners: Male    Birth control/protection: Condom    Comment: mutual friends  Lifestyle  . Physical activity    Days  per week: Not on file    Minutes per session: Not on file  . Stress: Not on file  Relationships  . Social Herbalist on phone: Not on file    Gets together: Not on file    Attends religious service: Not on file    Active member of club or organization: Not on file    Attends meetings of clubs  or organizations: Not on file    Relationship status: Not on file  Other Topics Concern  . Not on file  Social History Narrative  . Not on file    Review of Systems: General: Negative for anorexia, weight loss, fever, chills, fatigue, weakness. Eyes: Negative for vision changes.  ENT: Negative for hoarseness, difficulty swallowing , nasal congestion. CV: Negative for chest pain, angina, palpitations, dyspnea on exertion, peripheral edema.  Respiratory: Negative for dyspnea at rest, dyspnea on exertion, cough, sputum, wheezing.  GI: See history of present illness. GU:  Negative for dysuria, hematuria, urinary incontinence, urinary frequency, nocturnal urination.  MS: Negative for joint pain, low back pain.  Derm: Negative for rash or itching.  Neuro: Negative for weakness, abnormal sensation, seizure, frequent headaches, memory loss, confusion.  Psych: Negative for anxiety, depression, suicidal ideation, hallucinations.  Endo: Negative for unusual weight change.  Heme: Negative for bruising or bleeding. Allergy: Negative for rash or hives.   Physical Exam: BP (!) 141/79   Pulse 84   Temp (!) 97.1 F (36.2 C) (Oral)   Ht 5' 3"  (1.6 m)   Wt 155 lb 12.8 oz (70.7 kg)   BMI 27.60 kg/m  General:   Alert and oriented. Pleasant and cooperative. Well-nourished and well-developed.  Head:  Normocephalic and atraumatic. Eyes:  Without icterus, sclera clear and conjunctiva pink.  Ears:  Normal auditory acuity. Mouth:  No deformity or lesions, oral mucosa pink.  Throat/Neck:  Supple, without mass or thyromegaly. Cardiovascular:  S1, S2 present without murmurs appreciated. Normal  pulses noted. Extremities without clubbing or edema. Respiratory:  Clear to auscultation bilaterally. No wheezes, rales, or rhonchi. No distress.  Gastrointestinal:  +BS, soft, non-tender and non-distended. No HSM noted. No guarding or rebound. No masses appreciated.  Rectal:  Deferred  Musculoskalatal:  Symmetrical without gross deformities. Normal posture. Skin:  Intact without significant lesions or rashes. Neurologic:  Alert and oriented x4;  grossly normal neurologically. Psych:  Alert and cooperative. Normal mood and affect. Heme/Lymph/Immune: No significant cervical adenopathy. No excessive bruising noted.    10/09/2018 11:00 AM   Disclaimer: This note was dictated with voice recognition software. Similar sounding words can inadvertently be transcribed and may not be corrected upon review.

## 2018-10-09 NOTE — Assessment & Plan Note (Signed)
Previous flare of acute diverticulitis.  No signs of recurrent flare.  Continue to monitor and follow-up in 3 months.

## 2018-10-16 ENCOUNTER — Other Ambulatory Visit: Payer: Self-pay | Admitting: Pharmacy Technician

## 2018-10-16 ENCOUNTER — Other Ambulatory Visit: Payer: Self-pay | Admitting: Emergency Medicine

## 2018-10-16 NOTE — Patient Outreach (Signed)
[place] [facility]) Care Management  10/16/2018  Rachel [name] Oct 28, 1950 [id number]   Received patient portion(s) of patient assistance application(s) for Lantus, Ozempic, Proventil HFA and Spiriva. Faxed completed application and required documents into companies for Lantus ([employer]) Ozempic [employer] Nord[employer])Spiriva ([employer]). Prepared to mail Proventil HFA application to [employer]  Will follow up with company(ies) in 5-7 business days and [employer] 10-14 business days to check status of application(s).  Maud Deed Chana Bode [place] Certified Pharmacy Technician [place] Management Direct Dial:[phone]

## 2018-10-22 ENCOUNTER — Telehealth: Payer: Self-pay

## 2018-10-22 NOTE — Telephone Encounter (Signed)
Novo Nordisk approval until 01/06/2019 will be shipped to office

## 2018-10-23 ENCOUNTER — Other Ambulatory Visit: Payer: Self-pay | Admitting: Pharmacy Technician

## 2018-10-23 NOTE — Telephone Encounter (Signed)
ozempic received in office and pt called and informed to come pick it up

## 2018-10-23 NOTE — Patient Outreach (Signed)
[place] [facility]) Care Management  10/23/2018  RAEVYNN MULDOWNEY Apr 04, 1950 [id number]    Incoming call from patient regarding patient assistance medication delivery of Ozempic, HIPAA identifiers verified. Ms. Dubroc states that her providers office contacted her to let her know that her Ozempic had been delivered to office. Patient inquired about where other medications would be delivered upon approval. Informed her that Proventil and Spiriva would be delivered to her home and Lantus Solostar would be delivered to providers office as well. She also inquired about Novolog, informed her that I did not have medication listed as item we are trying to get assistance for. She states that she is still on medication as well.   Faxed prescriber portion of Novo Nordisk application to Dr. Kelton Pillar for [employer].  Follow up:  Will submit to company once document has been received.  Maud Deed Chana Bode [place] Certified Pharmacy Technician [place] Management Direct Dial:[phone]

## 2018-10-24 ENCOUNTER — Other Ambulatory Visit: Payer: Self-pay | Admitting: Pharmacy Technician

## 2018-10-24 NOTE — Patient Outreach (Addendum)
[place] [facility]) Care Management  10/24/2018  Rachel [name] [birth year] [id number]    Follow up call placed to [employer] regarding patient assistance application(s) for Spiriva , Rachel [name] states that patient has been denied due to being over income. Decision can be appealed.  Follow up:  Will follow up with patient in 1-2 business days to discuss appeal process.   Follow up call placed to [employer] regarding patient assistance application(s) for Lantus Solostar , Lorna Few confirms that patient has been approved as of 9/17 until 02/06/19. Medication to arrive at providers office in 7-10 business days.  Maud Deed Chana Bode [place] Certified Pharmacy Technician [place] Management Direct Dial:[phone]

## 2018-10-25 ENCOUNTER — Other Ambulatory Visit: Payer: Self-pay | Admitting: Pharmacy Technician

## 2018-10-25 NOTE — Patient Outreach (Signed)
[place] [facility]) Care Management  10/25/2018  Rachel [name] May 29, 1950 [id number]    Unsuccessful call placed to patient regarding patient assistance update for patient asistance programs, HIPAA compliant voicemail left.   Follow up:  Will make 2nd call attempt in 2-3 business days if call has not been returned.  Maud Deed Chana Bode [place] Certified Pharmacy Technician [place] Management Direct Dial:[phone]

## 2018-10-25 NOTE — Telephone Encounter (Signed)
Fax received from [employer] that Lantus is approved and will be shipped here to the office

## 2018-10-28 ENCOUNTER — Telehealth: Payer: Self-pay

## 2018-10-28 ENCOUNTER — Other Ambulatory Visit: Payer: Self-pay | Admitting: Pharmacy Technician

## 2018-10-28 NOTE — Patient Outreach (Signed)
[place] [facility]) Care Management  10/28/2018  AKERAH SENTER 09-17-1950 [id number]    Incoming call from patient regarding patient assistance application(s) for Spiriva , HIPAA identifiers verified. Informed Ms. Grand of Spiriva denial thru [employer] but informed her that we could appeal decision. Informed her that she would need to send in her OOP spend report for 2020. She states that she will get it from her pharmacy and mail it back to me in the envelope that I mail to her.  Follow up:  Will submit to [employer] once document has been received.  Maud Deed Chana Bode [place] Certified Pharmacy Technician [place] Management Direct Dial:[phone]

## 2018-10-28 NOTE — Telephone Encounter (Signed)
Paperwork faxed to [employer]

## 2018-10-28 NOTE — Telephone Encounter (Signed)
Pt stated that she is on the same dosage she was before being asked to stop which was 4 units.

## 2018-10-28 NOTE — Telephone Encounter (Signed)
Spoke to pt and informed her that we had received an application approving Novolog from [employer] pt assistance. However, pt was instructed to stop Novolog during last OV. Pt stated that she has not stopped using Novolog and that she only uses it when her blood sugars are too high for her to eat. Please advise.

## 2018-10-28 NOTE — Telephone Encounter (Signed)
The problem is I am required to put in a dose when I fill in her application, so I have no idea what she is taking

## 2018-10-29 NOTE — Telephone Encounter (Signed)
PAP Lantus has arrived and is in the fridge awaiting her to pick up  LVM for pt to know that the meds are here

## 2018-10-30 ENCOUNTER — Other Ambulatory Visit: Payer: Self-pay | Admitting: Pharmacy Technician

## 2018-10-30 NOTE — Patient Outreach (Addendum)
[place] [facility]) Care Management  10/30/2018  Rachel [name] 11-Jun-1950 [id number]    Follow up call placed to [employer] regarding patient assistance application(s) for Proventil HFA , Vicente Males confirms that applccation has been received. Attestation form was mailed out to patients home on 9/18.   Unsuccessful call placed to patient regarding patient assistance receipt of attestation form from [employer] for [employer], HIPAA compliant voicemail left.   Follow up:  Will make another call attempt in 2-3 business days if call has not been returned.  ADDENDUM 4:24pm  Incoming call from patient, HIPAA identifiers verified, informed patient about attestation form that was mailed from [employer] on 9/18. Requested that she contact once she has received it so that I may assist her with completing it.  **Received provider portion(s) of patient assistance application(s) for Novolog. Faxed completed application and required documents into [employer].  Will follow up with company(ies) in 3-5 business days to check status of application(s).  Maud Deed Chana Bode [place] Certified Pharmacy Technician [place] Management Direct Dial:[phone]

## 2018-10-31 ENCOUNTER — Other Ambulatory Visit: Payer: Self-pay | Admitting: *Deleted

## 2018-10-31 DIAGNOSIS — Z87891 Personal history of nicotine dependence: Secondary | ICD-10-CM

## 2018-10-31 DIAGNOSIS — F17210 Nicotine dependence, cigarettes, uncomplicated: Secondary | ICD-10-CM

## 2018-10-31 DIAGNOSIS — Z122 Encounter for screening for malignant neoplasm of respiratory organs: Secondary | ICD-10-CM

## 2018-11-06 ENCOUNTER — Other Ambulatory Visit: Payer: Self-pay | Admitting: Pharmacy Technician

## 2018-11-06 NOTE — Patient Outreach (Signed)
[place] [facility]) Care Management  11/06/2018  Rachel [name] 04/29/1950 [id number]    Follow up call placed to [name] regarding patient assistance application(s) for Novolog , [name] confirms patient has been approved as of 10/16/23 until 02/06/19. Medication and and pentips to arrive at Dr. Nathaneil Canary office in 10-14 business days.  Follow up:  Will follow up with patient in 14-20 business days to confirm medication has been received.  Maud Deed Chana Bode [place] Certified Pharmacy Technician [place] Management Direct Dial:[phone]

## 2018-11-08 NOTE — Telephone Encounter (Signed)
PAP Novolog is in the fridge awaiting her to pick up  LM for pt to know that the meds are here

## 2018-11-09 ENCOUNTER — Other Ambulatory Visit: Payer: Self-pay | Admitting: Family Medicine

## 2018-11-12 ENCOUNTER — Other Ambulatory Visit: Payer: Self-pay | Admitting: Family Medicine

## 2018-11-12 ENCOUNTER — Other Ambulatory Visit: Payer: Self-pay | Admitting: Cardiovascular Disease

## 2018-11-12 ENCOUNTER — Other Ambulatory Visit: Payer: Self-pay | Admitting: Pharmacy Technician

## 2018-11-12 NOTE — Patient Outreach (Signed)
[name]) Care Management  11/12/2018  Rachel [name] March 31, 1950 [id number]    Incoming call from patient regarding patient assistance receipt of attestation form from [place] for Proventil HFA, HIPAA identifiers verified. Assisted Rachel [name] with filling out attestation form. Rachel [name] states that she is going to mail back in OOP spend report for Spiriva appeal. She confirms that she received Ozempic and that she will pick her Lantus and Novolog from providers office tomorrow.  Follow up:  Will follow up with [employer] in 7-10 business days to check application status of Proventil HFA.  Maud Deed Chana Bode [place] Certified Pharmacy Technician [place] Management Direct Dial:[phone]

## 2018-11-13 ENCOUNTER — Other Ambulatory Visit (HOSPITAL_COMMUNITY): Payer: Self-pay | Admitting: Cardiovascular Disease

## 2018-11-13 DIAGNOSIS — Z95820 Peripheral vascular angioplasty status with implants and grafts: Secondary | ICD-10-CM

## 2018-11-13 DIAGNOSIS — I739 Peripheral vascular disease, unspecified: Secondary | ICD-10-CM

## 2018-11-14 ENCOUNTER — Encounter: Payer: Self-pay | Admitting: Family Medicine

## 2018-11-14 ENCOUNTER — Other Ambulatory Visit: Payer: Self-pay

## 2018-11-14 ENCOUNTER — Ambulatory Visit (INDEPENDENT_AMBULATORY_CARE_PROVIDER_SITE_OTHER): Payer: Medicare HMO | Admitting: Family Medicine

## 2018-11-14 VITALS — BP 110/68 | Temp 97.9°F | Ht 63.0 in | Wt 151.0 lb

## 2018-11-14 DIAGNOSIS — Z23 Encounter for immunization: Secondary | ICD-10-CM

## 2018-11-14 DIAGNOSIS — E782 Mixed hyperlipidemia: Secondary | ICD-10-CM | POA: Diagnosis not present

## 2018-11-14 DIAGNOSIS — Z79899 Other long term (current) drug therapy: Secondary | ICD-10-CM

## 2018-11-14 DIAGNOSIS — I10 Essential (primary) hypertension: Secondary | ICD-10-CM | POA: Diagnosis not present

## 2018-11-14 DIAGNOSIS — E039 Hypothyroidism, unspecified: Secondary | ICD-10-CM | POA: Diagnosis not present

## 2018-11-14 DIAGNOSIS — I739 Peripheral vascular disease, unspecified: Secondary | ICD-10-CM | POA: Diagnosis not present

## 2018-11-14 DIAGNOSIS — F321 Major depressive disorder, single episode, moderate: Secondary | ICD-10-CM | POA: Diagnosis not present

## 2018-11-14 DIAGNOSIS — E1140 Type 2 diabetes mellitus with diabetic neuropathy, unspecified: Secondary | ICD-10-CM | POA: Diagnosis not present

## 2018-11-14 MED ORDER — ESCITALOPRAM OXALATE 10 MG PO TABS: 10.0000 mg | ORAL_TABLET | ORAL | 0 refills | Status: DC

## 2018-11-14 MED ORDER — ROSUVASTATIN CALCIUM 40 MG PO TABS: 40.0000 mg | ORAL_TABLET | Freq: Every day | ORAL | 1 refills | Status: AC

## 2018-11-14 MED ORDER — ESCITALOPRAM OXALATE 10 MG PO TABS: 10.0000 mg | ORAL_TABLET | Freq: Every day | ORAL | 1 refills | Status: AC

## 2018-11-14 MED ORDER — GABAPENTIN 600 MG PO TABS: 600.0000 mg | ORAL_TABLET | Freq: Every day | ORAL | 1 refills | Status: AC

## 2018-11-14 NOTE — Progress Notes (Signed)
   Subjective:  Patient arrives for multiple concerns.  Patient ID: Rachel [name], female    DOB: 02-03-1951, 68 y.o.   MRN: [id number]  Hyperlipidemia This is a chronic problem. Treatments tried: crestor. Compliance problems include adherence to exercise (takes meds every day, eats healthy, not much exercise).    Would like gabapentin increased. Takes 400mg  qhs.  Note nighttime neuropathy is worsening.  This leads to  Difficulty sleeping.   Sees diabetic specialist.   Feeling isolated and feelign down   Talking on the phone oot the same   Sees daughter and thre g ids   Not durong the week   Not daily conections shes had    Not exercising   Painful neuropathy more of an issue than usual  Blood pressure medicine and blood pressure levels reviewed today with patient. Compliant with blood pressure medicine. States does not miss a dose. No obvious side effects. Blood pressure generally good when checked elsewhere. Watching salt intake.   Patient feeling more depressed these days.  No suicidal thoughts.  Just feeling down a lot.  Feeling quite disjointed from her family since she cannot see them very often due to the pandemic   Review of Systems No headache, no major weight loss or weight gain, no chest pain no back pain abdominal pain no change in bowel habits complete ROS otherwise negative     Objective:   Physical Exam Alert and oriented, vitals reviewed and stable, NAD ENT-TM's and ext canals WNL bilat via otoscopic exam Soft palate, tonsils and post pharynx WNL via oropharyngeal exam Neck-symmetric, no masses; thyroid nonpalpable and nontender Pulmonary-no tachypnea or accessory muscle use; Clear without wheezes via auscultation Card--no abnrml murmurs, rhythm reg and rate WNL Carotid pulses symmetric, without bruits        Assessment & Plan:  Impression 1 diabetic neuropathy.  Worsening as far as pain.  Discussed regular increase gabapentin to 600.  She  has rationale discussed  2.  Hypertension.  Overall good control discussed maintain same meds  3.  Depression new onset discussed.  Medicine would be a good idea.  Exercise also encouraged  4.  Hypothyroidism.  Status uncertain.  Check blood work  5.  Hyperlipidemia.  Status uncertain.  With known peripheral arterial disease tight control important discussed we will reevaluate  Greater than 50% of this 40 minute face to face visit was spent in counseling and discussion and coordination of care regarding the above diagnosis/diagnosies  Follow-up appointment scheduled   Follow-up as scheduled medication refilled diet exercise discussed flu shot discussed and administered where appropriate

## 2018-11-20 ENCOUNTER — Ambulatory Visit (HOSPITAL_COMMUNITY): Payer: Medicare HMO

## 2018-11-20 DIAGNOSIS — I10 Essential (primary) hypertension: Secondary | ICD-10-CM | POA: Diagnosis not present

## 2018-11-20 DIAGNOSIS — E782 Mixed hyperlipidemia: Secondary | ICD-10-CM | POA: Diagnosis not present

## 2018-11-20 DIAGNOSIS — E039 Hypothyroidism, unspecified: Secondary | ICD-10-CM | POA: Diagnosis not present

## 2018-11-20 DIAGNOSIS — Z79899 Other long term (current) drug therapy: Secondary | ICD-10-CM | POA: Diagnosis not present

## 2018-11-21 LAB — LIPID PANEL
Chol/HDL Ratio: 1.9 ratio (ref 0.0–4.4)
Cholesterol, Total: 88 mg/dL — ABNORMAL LOW (ref 100–199)
HDL: 47 mg/dL (ref >39)
LDL Chol Calc (NIH): 25 mg/dL (ref 0–99)
Triglycerides: 80 mg/dL (ref 0–149)
VLDL Cholesterol Cal: 16 mg/dL (ref 5–40)

## 2018-11-21 LAB — BASIC METABOLIC PANEL
BUN/Creatinine Ratio: 13 (ref 12–28)
BUN: 15 mg/dL (ref 8–27)
CO2: 18 mmol/L — ABNORMAL LOW (ref 20–29)
Calcium: 9.4 mg/dL (ref 8.7–10.3)
Chloride: 101 mmol/L (ref 96–106)
Creatinine, Ser: 1.12 mg/dL — ABNORMAL HIGH (ref 0.57–1.00)
GFR calc Af Amer: 59 mL/min/{1.73_m2} — ABNORMAL LOW (ref >59)
GFR calc non Af Amer: 51 mL/min/{1.73_m2} — ABNORMAL LOW (ref >59)
Glucose: 231 mg/dL — ABNORMAL HIGH (ref 65–99)
Potassium: 3.2 mmol/L — ABNORMAL LOW (ref 3.5–5.2)
Sodium: 140 mmol/L (ref 134–144)

## 2018-11-21 LAB — HEPATIC FUNCTION PANEL
ALT: 21 IU/L (ref 0–32)
AST: 35 IU/L (ref 0–40)
Albumin: 4 g/dL (ref 3.8–4.8)
Alkaline Phosphatase: 91 IU/L (ref 39–117)
Bilirubin Total: 0.2 mg/dL (ref 0.0–1.2)
Bilirubin, Direct: 0.1 mg/dL (ref 0.00–0.40)
Total Protein: 6.9 g/dL (ref 6.0–8.5)

## 2018-11-21 LAB — TSH: TSH: 17.8 u[IU]/mL — ABNORMAL HIGH (ref 0.450–4.500)

## 2018-11-26 ENCOUNTER — Telehealth: Payer: Self-pay | Admitting: Family Medicine

## 2018-11-26 NOTE — Telephone Encounter (Signed)
Patient would like results of labs.

## 2018-11-27 MED ORDER — LEVOTHYROXINE SODIUM 125 MCG PO TABS: ORAL_TABLET | ORAL | 1 refills | Status: AC

## 2018-11-27 NOTE — Addendum Note (Signed)
Addended by: Vicente Males on: 11/27/2018 03:49 PM   Modules accepted: Orders

## 2018-11-28 NOTE — Telephone Encounter (Signed)
See result notes. This was discussed with pt yesterday

## 2018-12-05 ENCOUNTER — Other Ambulatory Visit: Payer: Self-pay | Admitting: Pharmacy Technician

## 2018-12-05 NOTE — Patient Outreach (Signed)
[facility]) Care Management  12/05/2018  Rachel [name] 1950-02-08 [id number]    Follow up call placed to [employer] regarding patient assistance update for Janumet, Abran Richard states that [employer] has not received attestation form back from patient. Informed her that patient has mailed it back in on 10/7. Abran Richard spoke to supervisor and got authorization to remail patient another attestation form and patient will be allowed to fax back into them.  Unsuccessful call  place to patient to inform, HIPAA compliant voicemail left.  Will make another call attempt in 2-3 business days if call has not been returned.  Maud Deed Chana Bode [place] Certified Pharmacy Technician [place] Management Direct Dial:[phone]

## 2018-12-10 ENCOUNTER — Ambulatory Visit (HOSPITAL_BASED_OUTPATIENT_CLINIC_OR_DEPARTMENT_OTHER)
Admission: RE | Admit: 2018-12-10 | Discharge: 2018-12-10 | Disposition: A | Discharge status: Home / Self Care | Payer: Medicare HMO | Source: Ambulatory Visit | Attending: Cardiovascular Disease | Admitting: Cardiovascular Disease

## 2018-12-10 ENCOUNTER — Other Ambulatory Visit (HOSPITAL_COMMUNITY): Payer: Self-pay | Admitting: Cardiovascular Disease

## 2018-12-10 ENCOUNTER — Ambulatory Visit (INDEPENDENT_AMBULATORY_CARE_PROVIDER_SITE_OTHER): Payer: Medicare HMO | Admitting: Cardiovascular Disease

## 2018-12-10 ENCOUNTER — Ambulatory Visit (HOSPITAL_COMMUNITY)
Admission: RE | Admit: 2018-12-10 | Discharge: 2018-12-10 | Disposition: A | Discharge status: Home / Self Care | Payer: Medicare HMO | Source: Ambulatory Visit | Attending: Cardiology | Admitting: Cardiology

## 2018-12-10 ENCOUNTER — Other Ambulatory Visit: Payer: Self-pay | Admitting: Family Medicine

## 2018-12-10 ENCOUNTER — Encounter: Payer: Self-pay | Admitting: Cardiovascular Disease

## 2018-12-10 ENCOUNTER — Other Ambulatory Visit: Payer: Self-pay

## 2018-12-10 ENCOUNTER — Ambulatory Visit (HOSPITAL_BASED_OUTPATIENT_CLINIC_OR_DEPARTMENT_OTHER)
Admission: RE | Admit: 2018-12-10 | Discharge: 2018-12-10 | Disposition: A | Discharge status: Home / Self Care | Payer: Medicare HMO | Source: Ambulatory Visit | Attending: Cardiology | Admitting: Cardiology

## 2018-12-10 VITALS — BP 132/76 | HR 100 | Temp 95.9°F | Ht 63.0 in | Wt 151.7 lb

## 2018-12-10 DIAGNOSIS — F17200 Nicotine dependence, unspecified, uncomplicated: Secondary | ICD-10-CM | POA: Diagnosis not present

## 2018-12-10 DIAGNOSIS — I10 Essential (primary) hypertension: Secondary | ICD-10-CM | POA: Diagnosis not present

## 2018-12-10 DIAGNOSIS — I6529 Occlusion and stenosis of unspecified carotid artery: Secondary | ICD-10-CM | POA: Insufficient documentation

## 2018-12-10 DIAGNOSIS — Z95820 Peripheral vascular angioplasty status with implants and grafts: Secondary | ICD-10-CM

## 2018-12-10 DIAGNOSIS — I739 Peripheral vascular disease, unspecified: Secondary | ICD-10-CM

## 2018-12-10 DIAGNOSIS — E782 Mixed hyperlipidemia: Secondary | ICD-10-CM | POA: Diagnosis not present

## 2018-12-10 DIAGNOSIS — Z95828 Presence of other vascular implants and grafts: Secondary | ICD-10-CM

## 2018-12-10 DIAGNOSIS — I6523 Occlusion and stenosis of bilateral carotid arteries: Secondary | ICD-10-CM

## 2018-12-10 NOTE — Assessment & Plan Note (Signed)
Currently in the process of discontinue tobacco abuse.

## 2018-12-10 NOTE — Assessment & Plan Note (Signed)
History of peripheral arterial disease that is post bilateral iliac intervention with stents by myself in the past using "kissing stent technique" with covered stents.  She does have bilateral SFA disease as well as one-vessel runoff.  I attempted to address her 99% calcified mid left SFA stenosis on 03/13/2016.  I was unable to cross the iliac bifurcation because of the previously placed stents in the attempted antegrade access on the ipsilateral side unsuccessfully.  She still complains of claudication.

## 2018-12-10 NOTE — Assessment & Plan Note (Signed)
History of essential hypertension with blood pressure measured today 132/76.  She is on no antihypertensive medications.

## 2018-12-10 NOTE — Progress Notes (Signed)
12/10/2018 Rachel [name]   07-08-1950  [id number]  Primary Physician Mikey Kirschner, MD Primary Cardiologist: [name] MD [name], [place]  HPI:  Rachel [name] is a 68 y.o.  mildly overweight divorced African-American female mother of one, grandmother of one grandchild who is referred by Dr. [name] for lifestyle limiting claudication. I last saw her via virtual video telemedicine visit on 06/20/2018.Marland Kitchen  Her primary care physician is Dr. [name]. She has a history of tobacco abuse smoking one pack per day for last 45 years, treated hypertension, diabetes and hyperlipidemia. She does have ischemic heart disease status post RCA intervention February 2010 with overlapping drug eluting stents in her RCA. She had repeat cath the following year revealing these to be widely patent. She complains of right lower extremity claudication claudication left greater than right over the last 2 years. 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 I reviewed this with Dr. Trula Slade who suggested that we attempt percutaneous vascular station before considering aortobifemoral bypass grafting. I angiogram on 10/15/14 and was able to cross her left common iliac chronic total occlusion. I stented both iliac arteries with I cast cover stents using "kissing stent technique". Her procedure was stopped. By a significant hematoma in the left side with a left common femoral pseudoaneurysm that was partially thrombosed initially and follow-up Doppler was completely thrombosed. Her hemoglobin stabilized not requiring transfusion. Her claudication has resolved although she still has some pain in her left hip from resolving hematoma.  Her Doppler studies showed post procedure normal ABIs bilaterally and her claudication resolved.  She had a repeat procedure because of a drop in her ABI and recurrent claudication 03/13/2016 revealing patent iliac stent stents with moderate calcification in the left common femoral artery and high-grade 95% calcification in the mid left SFA.  I was unable to cross the iliac bifurcation because of the previously placed stents.  I brought her back on 03/27/2016 and because of the dense calcification was unable to get antegrade access on the left with the intent to perform atherectomy and drug-coated balloon angioplasty and the procedure was aborted.  Since I saw her back a year ago she is remained stable.  She does have mild lifestyle limiting claudication.  She is continues to smoke although she is trying to quit.  She denies chest pain or shortness of breath.    She has lower extremity arterial Doppler scheduled for today.  She also has moderate right ICA stenosis and this will be rechecked as well.  Current Meds  Medication Sig  . albuterol (PROAIR HFA) 108 (90 Base) MCG/ACT inhaler Inhale 2 puffs into the lungs every 4 (four) hours as needed for wheezing or shortness of breath. Please supply 3 months.  Marland Kitchen aspirin EC 81 MG tablet Take 81 mg by mouth daily.    . clopidogrel (PLAVIX) 75 MG tablet TAKE 1 TABLET EVERY DAY  . Continuous Blood Gluc Receiver (FREESTYLE LIBRE READER) DEVI 1 kit by Does not apply route 4 (four) times daily.  . Continuous Blood Gluc Sensor (FREESTYLE LIBRE 14 DAY SENSOR) MISC 1 Device by Does not apply route as directed.  . escitalopram (LEXAPRO) 10 MG tablet Take 1 tablet (10 mg total)  by mouth every morning.  . escitalopram (LEXAPRO) 10 MG tablet Take 1 tablet (10 mg total) by mouth daily.  . fluticasone (FLONASE) 50 MCG/ACT nasal spray PLACE 2 SPRAYS INTO BOTH NOSTRILS DAILY.  Marland Kitchen gabapentin (NEURONTIN) 600 MG tablet Take 1 tablet (600 mg total) by mouth at bedtime.  . Insulin  Glargine (LANTUS SOLOSTAR) 100 UNIT/ML Solostar Pen Inject 24 Units into the skin daily.  Marland Kitchen levothyroxine (SYNTHROID) 125 MCG tablet Take one tablet by mouth daily  . levothyroxine (SYNTHROID, LEVOTHROID) 100 MCG tablet TAKE 1 TABLET BY MOUTH EVERY DAY BEFORE BREAKFAST (Patient taking differently: Take 100 mcg by mouth daily before breakfast. )  . linaclotide (LINZESS) 72 MCG capsule Take 1 capsule (72 mcg total) by mouth daily before breakfast.  . meclizine (ANTIVERT) 25 MG tablet Take 1 tablet (25 mg total) by mouth 3 (three) times daily as needed for dizziness.  . metFORMIN (GLUCOPHAGE) 1000 MG tablet Take 1 tablet (1,000 mg total) by mouth daily with breakfast.  . NOVOLOG FLEXPEN 100 UNIT/ML FlexPen INJECT 4 UNITS INTO THE SKIN 3 (THREE) TIMES DAILY WITH MEALS.  Marland Kitchen pantoprazole (PROTONIX) 40 MG tablet Take 1 tablet (40 mg total) by mouth 2 (two) times daily before a meal.  . rosuvastatin (CRESTOR) 40 MG tablet Take 1 tablet (40 mg total) by mouth at bedtime.  . Semaglutide,0.25 or 0.5MG/DOS, (OZEMPIC, 0.25 OR 0.5 MG/DOSE,) 2 MG/1.5ML SOPN Inject 0.5 mg into the skin once a week.  . umeclidinium bromide (INCRUSE ELLIPTA) 62.5 MCG/INH AEPB Inhale 1 puff into the lungs daily.     Allergies  Allergen Reactions  . Altace [Ramipril] Cough  . Biaxin [Clarithromycin] Other (See Comments)    Fatigue  . Niacin And Related Other (See Comments)    Flush - felt like on fire.  . Nsaids Other (See Comments)    feverish  . Tramadol Other (See Comments)    Lethargic   . Adhesive [Tape] Rash and Other (See Comments)    Paper tape is ok    Social History   Socioeconomic History  . Marital status: Divorced    Spouse name: Not on file  . Number of children: 1  . Years of education: Not on file  . Highest education level: Not on file  Occupational History  . Occupation: retired  Scientific laboratory technician  . Financial resource strain: Not on file  . Food insecurity    Worry: Not on file    Inability: Not on  file  . Transportation needs    Medical: Not on file    Non-medical: Not on file  Tobacco Use  . Smoking status: Current Every Day Smoker    Packs/day: 1.00    Years: 50.00    Pack years: 50.00    Types: Cigarettes  . Smokeless tobacco: Never Used  . Tobacco comment: 0.5ppd today ; Has plans to start quitting smoking with pulm assistance.  Substance and Sexual Activity  . Alcohol use: No  . Drug use: No  . Sexual activity: Yes    Partners: Male    Birth control/protection: Condom    Comment: mutual friends  Lifestyle  . Physical activity    Days per week: Not on file    Minutes per session: Not on file  . Stress: Not on file  Relationships  . Social Herbalist on phone: Not on file    Gets together: Not on file    Attends religious service: Not on file  Active member of club or organization: Not on file    Attends meetings of clubs or organizations: Not on file    Relationship status: Not on file  . Intimate partner violence    Fear of current or ex partner: Not on file    Emotionally abused: Not on file    Physically abused: Not on file    Forced sexual activity: Not on file  Other Topics Concern  . Not on file  Social History Narrative  . Not on file     Review of Systems: General: negative for chills, fever, night sweats or weight changes.  Cardiovascular: negative for chest pain, dyspnea on exertion, edema, orthopnea, palpitations, paroxysmal nocturnal dyspnea or shortness of breath Dermatological: negative for rash Respiratory: negative for cough or wheezing Urologic: negative for hematuria Abdominal: negative for nausea, vomiting, diarrhea, bright red blood per rectum, melena, or hematemesis Neurologic: negative for visual changes, syncope, or dizziness All other systems reviewed and are otherwise negative except as noted above.    Blood pressure 132/76, pulse 100, temperature (!) 95.9 F (35.5 C), height 5' 3"  (1.6 m), weight 151 lb 11.2 oz  (68.8 kg), SpO2 97 %.  General appearance: alert and no distress Neck: no adenopathy, no carotid bruit, no JVD, supple, symmetrical, trachea midline and thyroid not enlarged, symmetric, no tenderness/mass/nodules Lungs: clear to auscultation bilaterally Heart: regular rate and rhythm, S1, S2 normal, no murmur, click, rub or gallop Extremities: extremities normal, atraumatic, no cyanosis or edema Pulses: Diminished pedal pulses Skin: Skin color, texture, turgor normal. No rashes or lesions Neurologic: Alert and oriented X 3, normal strength and tone. Normal symmetric reflexes. Normal coordination and gait  EKG sinus rhythm at 94 with lateral ST segment depression.  Personally reviewed this EKG.  ASSESSMENT AND PLAN:   Mixed hyperlipidemia History of hyperlipidemia on statin therapy with lipid profile performed by her PCP 11/20/2018 revealing an LDL of 61 and HDL 47.  TOBACCO ABUSE Currently in the process of discontinue tobacco abuse.  Coronary atherosclerosis of native coronary artery History of CAD status post RCA intervention February 2010 with overlapping drug-eluting stents.  The following year showed these to be widely patent.  She denies chest pain  Peripheral arterial disease ([place]) History of peripheral arterial disease that is post bilateral iliac intervention with stents by myself in the past using "kissing stent technique" with covered stents.  She does have bilateral SFA disease as well as one-vessel runoff.  I attempted to address her 99% calcified mid left SFA stenosis on 03/13/2016.  I was unable to cross the iliac bifurcation because of the previously placed stents in the attempted antegrade access on the ipsilateral side unsuccessfully.  She still complains of claudication.  Essential hypertension, benign History of essential hypertension with blood pressure measured today 132/76.  She is on no antihypertensive medications.      [name] MD FACP,FACC,FAHA,  [facility] 12/10/2018 9:54 AM

## 2018-12-10 NOTE — Assessment & Plan Note (Signed)
History of hyperlipidemia on statin therapy with lipid profile performed by her PCP 11/20/2018 revealing an LDL of 61 and HDL 47.

## 2018-12-10 NOTE — Patient Instructions (Addendum)
Medication Instructions:  Your physician recommends that you continue on your current medications as directed. Please refer to the Current Medication list given to you today.  If you need a refill on your cardiac medications before your next appointment, please call your pharmacy.   Lab work: NONE If you have labs (blood work) drawn today and your tests are completely normal, you will receive your results only by: [place] (if you have MyChart) OR A paper copy in the mail If you have any lab test that is abnormal or we need to change your treatment, we will call you to review the results.  Testing/Procedures: Your physician has requested that you have a carotid duplex. This test is an ultrasound of the carotid arteries in your neck. It looks at blood flow through these arteries that supply the brain with blood. Allow one hour for this exam. There are no restrictions or special instructions.   Follow-Up: At [facility], you and your health needs are our priority.  As part of our continuing mission to provide you with exceptional heart care, we have created designated Provider Care Teams.  These Care Teams include your primary Cardiologist (physician) and Advanced Practice Providers (APPs -  Physician Assistants and Nurse Practitioners) who all work together to provide you with the care you need, when you need it. You may see Quay Burow, MD or one of the following Advanced Practice Providers on your designated Care Team:    Kerin Ransom, PA-C  [place], [place]  Coletta Memos, [place] Your physician wants you to follow-up in: 1 year. You will receive a reminder letter in the mail two months in advance. If you don't receive a letter, please call our office to schedule the follow-up appointment.

## 2018-12-10 NOTE — Assessment & Plan Note (Signed)
History of CAD status post RCA intervention February 2010 with overlapping drug-eluting stents.  The following year showed these to be widely patent.  She denies chest pain

## 2018-12-11 ENCOUNTER — Encounter: Payer: Self-pay | Admitting: *Deleted

## 2018-12-13 ENCOUNTER — Telehealth: Payer: Self-pay | Admitting: Cardiovascular Disease

## 2018-12-13 NOTE — Telephone Encounter (Signed)
Spoke with pt, she will need to come into the office to discuss recent dopplers. Patient agreed.

## 2018-12-13 NOTE — Telephone Encounter (Signed)
New Message  Patient is calling in to see if she would be able to change her appointment scheduled on Dec 29, 2018 at 4:30 pm to a virtual visit. Please give patient a call back to advise.

## 2018-12-17 ENCOUNTER — Other Ambulatory Visit: Payer: Self-pay

## 2018-12-17 ENCOUNTER — Telehealth: Payer: Self-pay | Admitting: Cardiovascular Disease

## 2018-12-17 ENCOUNTER — Encounter: Payer: Self-pay | Admitting: Cardiovascular Disease

## 2018-12-17 ENCOUNTER — Ambulatory Visit (INDEPENDENT_AMBULATORY_CARE_PROVIDER_SITE_OTHER): Payer: Medicare HMO | Admitting: Cardiovascular Disease

## 2018-12-17 VITALS — BP 100/60 | HR 113 | Temp 93.7°F | Ht 63.0 in | Wt 160.0 lb

## 2018-12-17 DIAGNOSIS — I739 Peripheral vascular disease, unspecified: Secondary | ICD-10-CM | POA: Diagnosis not present

## 2018-12-17 MED ORDER — SODIUM CHLORIDE 0.9% FLUSH: 3.0000 mL | Freq: Two times a day (BID) | INTRAVENOUS | Status: AC

## 2018-12-17 NOTE — Assessment & Plan Note (Signed)
History of peripheral arterial disease status post bilateral iliac stenting by myself 10/15/2014 with ICast covered stents using "kissing stent technique.  I later went back to/19/18 after failed attempt to cross her iliac bifurcation due to her bilateral stents and tried antegrade approach on the left in order to access her left SFA because of a high-grade calcified lesion though it was unable to access her left SFA antegrade.  She now complains of right lower extremity claudication.  Recent Dopplers performed 12/10/2018 revealed a right ABI of 0.43 significantly dropped from her previous study at which time it was 0.96 with a high-frequency signal in her proximal right external iliac artery.  We will plan on performing peripheral angiography and endovascular therapy for lifestyle limiting claudication.

## 2018-12-17 NOTE — Telephone Encounter (Signed)
New message   Patient states that the stent that was put into the leg has collapsed and patient is in pain. Please call.

## 2018-12-17 NOTE — Telephone Encounter (Signed)
Spoke with pt, she will come in this afternoon to see dr berry.

## 2018-12-17 NOTE — Telephone Encounter (Signed)
Had dopplers done last Tuesday, 12/10/2018 & was told she had changes on her right leg.  She is c/o of severe pain in that leg especially at night.  Pain is from top of thigh down to heel of her foot.  Rated 10/10 last evening.  Stated she was taking Aleve, but that did nothing.  Stated since she has been up this morning, it has eased a little.  She is concerned about making it thru the night & having to deal with the pain like it was last evening.  She does already have OV scheduled for 23-Dec-2018 with Dr. Gwenlyn Found.

## 2018-12-17 NOTE — Patient Instructions (Addendum)
Medication Instructions:  Your physician recommends that you continue on your current medications as directed. Please refer to the Current Medication list given to you today.  If you need a refill on your cardiac medications before your next appointment, please call your pharmacy.   Lab work: CBC, BMET If you have labs (blood work) drawn today and your tests are completely normal, you will receive your results only by: [place] (if you have MyChart) OR A paper copy in the mail If you have any lab test that is abnormal or we need to change your treatment, we will call you to review the results.  Testing/Procedures: Your physician has requested that you have a lower extremity arterial exercise duplex 2 weeks after procedure. During this test, exercise and ultrasound are used to evaluate arterial blood flow in the legs. Allow one hour for this exam. There are no restrictions or special instructions.  Follow-Up: At [facility], you and your health needs are our priority.  As part of our continuing mission to provide you with exceptional heart care, we have created designated Provider Care Teams.  These Care Teams include your primary Cardiologist (physician) and Advanced Practice Providers (APPs -  Physician Assistants and Nurse Practitioners) who all work together to provide you with the care you need, when you need it. You may see Quay Burow, MD or one of the following Advanced Practice Providers on your designated Care Team:    Kerin Ransom, PA-C  [place], [place]  Coletta Memos, [place]  Your physician wants you to follow-up 2 weeks after procedure.       [place] [place] [address] Dept: [phone] Loc: [phone]  Rachel [name]  12/17/2018  You are scheduled for a Peripheral Angiogram on Monday, November 30 with Dr. Quay Burow.  1. Please  arrive at the [facility] (Main Entrance A) at [facility]: [address] [place], [place] [address] at 7:30 AM (This time is two hours before your procedure to ensure your preparation). Free valet parking service is available.   Special note: Every effort is made to have your procedure done on time. Please understand that emergencies sometimes delay scheduled procedures.  2. Diet: Do not eat solid foods after midnight.  The patient may have clear liquids until 5am upon the day of the procedure.  3. Labs: You will need to have blood drawn today.   4. Medication instructions in preparation for your procedure:  Hold insulin, levothyroxine, metformin, novolog, semaglutide   On the morning of your procedure, take your Plavix/Clopidogrel and any morning medicines NOT listed above.  You may use sips of water.  5. Plan for one night stay--bring personal belongings. 6. Bring a current list of your medications and current insurance cards. 7. You MUST have a responsible person to drive you home. 8. Someone MUST be with you the first 24 hours after you arrive home or your discharge will be delayed. 9. Please wear clothes that are easy to get on and off and wear slip-on shoes.  You will have to have a covid test prior to your procedure on 11/27 @ 12:20pm at the [facility]. [address], [place]  Thank you for allowing [place] to care for you!   -- [place] [facility]

## 2018-12-17 NOTE — Progress Notes (Signed)
12/17/2018 Rachel [name]   03-15-1950  [id number]  Primary Physician Mikey Kirschner, MD Primary Cardiologist: [name] MD [name], [place]  HPI:  Rachel [name] is a 68 y.o.  mildly overweight divorced African-American female mother of one, grandmother of one grandchild who is referred by Dr. [name] for lifestyle limiting claudication. I last saw her  in the office 12/10/2018. Her primary care physician is Dr. [name]. She has a history of tobacco abuse smoking one pack per day for last 45 years, treated hypertension, diabetes and hyperlipidemia. She does have ischemic heart disease status post RCA intervention February 2010 with overlapping drug eluting stents in her RCA. She had repeat cath the following year revealing these to be widely patent. She complains of right lower extremity claudication claudication left greater than right over the last 2 years. 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 I reviewed this with Dr. Trula Slade who suggested that we attempt percutaneous vascular station before considering aortobifemoral bypass grafting. I angiogram on 10/15/14 and was able to cross her left common iliac chronic total occlusion. I stented both iliac arteries with I cast cover stents using "kissing stent technique". Her procedure was stopped. By a significant hematoma in the left side with a left common femoral pseudoaneurysm that was partially thrombosed initially and follow-up Doppler was completely thrombosed. Her hemoglobin stabilized not requiring transfusion. Her claudication has resolved although she still has some pain in her left hip from resolving hematoma. Her Doppler studies  showed post procedure normal ABIs bilaterally and her claudication resolved.She had a repeat procedure because of a drop in her ABI and recurrent claudication 03/13/2016 revealing patent iliac stent stents with moderate calcification in the left common femoral artery and high-grade 95% calcification in the mid left SFA. I was unable to cross the iliac bifurcation because of the previously placed stents. I brought her back on 03/27/2016 and because of the dense calcification was unable to get antegrade access on the left with the intent to perform atherectomy and drug-coated balloon angioplasty and the procedure was aborted.  Since I saw herback a year ago she is remained stable.  She does complain of right lower extremity claudication. She is continues to smoke although she is trying to quit. She denies chest pain or shortness of breath.    She had lower extremity arterial Dopplers performed on 12/10/2018 reveal a decline in her right ABI from 0.96-0.43 with a high frequency signal in her right external iliac artery.  Current Meds  Medication Sig   albuterol (PROAIR HFA) 108 (90 Base) MCG/ACT inhaler Inhale 2 puffs into the lungs every 4 (four) hours as needed for wheezing or shortness of breath. Please supply 3 months.   aspirin EC 81 MG tablet Take 81 mg by mouth daily.     clopidogrel (PLAVIX) 75 MG tablet TAKE 1 TABLET EVERY DAY   Continuous Blood Gluc Receiver (FREESTYLE LIBRE READER) DEVI 1 kit by Does not apply route 4 (four) times daily.   Continuous Blood Gluc Sensor (FREESTYLE LIBRE 14 DAY SENSOR) MISC 1 Device by Does not apply route as directed.   escitalopram (LEXAPRO) 10 MG tablet Take 1 tablet (10 mg total) by mouth daily.  escitalopram (LEXAPRO) 10 MG tablet TAKE 1 TABLET BY MOUTH EVERY DAY IN THE MORNING   fluticasone (FLONASE) 50 MCG/ACT nasal spray PLACE 2 SPRAYS INTO BOTH NOSTRILS DAILY.   gabapentin (NEURONTIN) 600 MG tablet Take 1 tablet (600 mg total) by mouth at  bedtime.   Insulin Glargine (LANTUS SOLOSTAR) 100 UNIT/ML Solostar Pen Inject 24 Units into the skin daily.   levothyroxine (SYNTHROID) 125 MCG tablet Take one tablet by mouth daily   levothyroxine (SYNTHROID, LEVOTHROID) 100 MCG tablet TAKE 1 TABLET BY MOUTH EVERY DAY BEFORE BREAKFAST (Patient taking differently: Take 100 mcg by mouth daily before breakfast. )   linaclotide (LINZESS) 72 MCG capsule Take 1 capsule (72 mcg total) by mouth daily before breakfast.   meclizine (ANTIVERT) 25 MG tablet Take 1 tablet (25 mg total) by mouth 3 (three) times daily as needed for dizziness.   metFORMIN (GLUCOPHAGE) 1000 MG tablet Take 1 tablet (1,000 mg total) by mouth daily with breakfast.   NOVOLOG FLEXPEN 100 UNIT/ML FlexPen INJECT 4 UNITS INTO THE SKIN 3 (THREE) TIMES DAILY WITH MEALS.   pantoprazole (PROTONIX) 40 MG tablet Take 1 tablet (40 mg total) by mouth 2 (two) times daily before a meal.   rosuvastatin (CRESTOR) 40 MG tablet Take 1 tablet (40 mg total) by mouth at bedtime.   Semaglutide,0.25 or 0.5MG/DOS, (OZEMPIC, 0.25 OR 0.5 MG/DOSE,) 2 MG/1.5ML SOPN Inject 0.5 mg into the skin once a week.   umeclidinium bromide (INCRUSE ELLIPTA) 62.5 MCG/INH AEPB Inhale 1 puff into the lungs daily.     Allergies  Allergen Reactions   Altace [Ramipril] Cough   Biaxin [Clarithromycin] Other (See Comments)    Fatigue   Niacin And Related Other (See Comments)    Flush - felt like on fire.   Nsaids Other (See Comments)    feverish   Tramadol Other (See Comments)    Lethargic    Adhesive [Tape] Rash and Other (See Comments)    Paper tape is ok    Social History   Socioeconomic History   Marital status: Divorced    Spouse name: Not on file   Number of children: 1   Years of education: Not on file   Highest education level: Not on file  Occupational History   Occupation: retired  Scientist, product/process development strain: Not on file   Food insecurity    Worry: Not on file      Inability: Not on Lexicographer needs    Medical: Not on file    Non-medical: Not on file  Tobacco Use   Smoking status: Current Every Day Smoker    Packs/day: 1.00    Years: 50.00    Pack years: 50.00    Types: Cigarettes   Smokeless tobacco: Never Used   Tobacco comment: 0.5ppd today ; Has plans to start quitting smoking with pulm assistance.  Substance and Sexual Activity   Alcohol use: No   Drug use: No   Sexual activity: Yes    Partners: Male    Birth control/protection: Condom    Comment: mutual friends  Lifestyle   Physical activity    Days per week: Not on file    Minutes per session: Not on file   Stress: Not on file  Relationships   Social connections    Talks on phone: Not on file    Gets together: Not on file    Attends religious service: Not on file    Active member of club  or organization: Not on file    Attends meetings of clubs or organizations: Not on file    Relationship status: Not on file   Intimate partner violence    Fear of current or ex partner: Not on file    Emotionally abused: Not on file    Physically abused: Not on file    Forced sexual activity: Not on file  Other Topics Concern   Not on file  Social History Narrative   Not on file     Review of Systems: General: negative for chills, fever, night sweats or weight changes.  Cardiovascular: negative for chest pain, dyspnea on exertion, edema, orthopnea, palpitations, paroxysmal nocturnal dyspnea or shortness of breath Dermatological: negative for rash Respiratory: negative for cough or wheezing Urologic: negative for hematuria Abdominal: negative for nausea, vomiting, diarrhea, bright red blood per rectum, melena, or hematemesis Neurologic: negative for visual changes, syncope, or dizziness All other systems reviewed and are otherwise negative except as noted above.    Blood pressure 100/60, pulse (!) 113, temperature (!) 93.7 F (34.3 C), height 5' 3" (1.6  m), weight 160 lb (72.6 kg).  General appearance: alert and no distress Neck: no adenopathy, no carotid bruit, no JVD, supple, symmetrical, trachea midline and thyroid not enlarged, symmetric, no tenderness/mass/nodules Lungs: clear to auscultation bilaterally Heart: regular rate and rhythm, S1, S2 normal, no murmur, click, rub or gallop Extremities: extremities normal, atraumatic, no cyanosis or edema Pulses: Diminished pedal pulses Skin: Skin color, texture, turgor normal. No rashes or lesions Neurologic: Alert and oriented X 3, normal strength and tone. Normal symmetric reflexes. Normal coordination and gait  EKG sinus tachycardia 114 with nonspecific ST-T wave changes.I   Personally reviewed this EKG.  ASSESSMENT AND PLAN:   Peripheral arterial disease ([place]) History of peripheral arterial disease status post bilateral iliac stenting by myself 10/15/2014 with ICast covered stents using "kissing stent technique.  I later went back to/19/18 after failed attempt to cross her iliac bifurcation due to her bilateral stents and tried antegrade approach on the left in order to access her left SFA because of a high-grade calcified lesion though it was unable to access her left SFA antegrade.  She now complains of right lower extremity claudication.  Recent Dopplers performed 12/10/2018 revealed a right ABI of 0.43 significantly dropped from her previous study at which time it was 0.96 with a high-frequency signal in her proximal right external iliac artery.  We will plan on performing peripheral angiography and endovascular therapy for lifestyle limiting claudication.      [name] MD FACP,FACC,FAHA, [facility] 12/17/2018 3:53 PM

## 2018-12-18 ENCOUNTER — Encounter (HOSPITAL_COMMUNITY): Payer: Self-pay

## 2018-12-18 ENCOUNTER — Emergency Department (HOSPITAL_COMMUNITY)
Admission: EM | Admit: 2018-12-18 | Discharge: 2019-01-07 | Disposition: E | Payer: Medicare HMO | Attending: Emergency Medicine | Admitting: Emergency Medicine

## 2018-12-18 ENCOUNTER — Ambulatory Visit: Payer: Medicare HMO | Admitting: Cardiovascular Disease

## 2018-12-18 DIAGNOSIS — R0602 Shortness of breath: Secondary | ICD-10-CM | POA: Diagnosis not present

## 2018-12-18 DIAGNOSIS — R079 Chest pain, unspecified: Secondary | ICD-10-CM | POA: Diagnosis not present

## 2018-12-18 DIAGNOSIS — I469 Cardiac arrest, cause unspecified: Secondary | ICD-10-CM

## 2018-12-18 DIAGNOSIS — E119 Type 2 diabetes mellitus without complications: Secondary | ICD-10-CM | POA: Insufficient documentation

## 2018-12-18 DIAGNOSIS — I2510 Atherosclerotic heart disease of native coronary artery without angina pectoris: Secondary | ICD-10-CM | POA: Insufficient documentation

## 2018-12-18 DIAGNOSIS — Z7982 Long term (current) use of aspirin: Secondary | ICD-10-CM | POA: Insufficient documentation

## 2018-12-18 DIAGNOSIS — I10 Essential (primary) hypertension: Secondary | ICD-10-CM | POA: Diagnosis not present

## 2018-12-18 DIAGNOSIS — Z794 Long term (current) use of insulin: Secondary | ICD-10-CM | POA: Insufficient documentation

## 2018-12-18 DIAGNOSIS — F17210 Nicotine dependence, cigarettes, uncomplicated: Secondary | ICD-10-CM | POA: Diagnosis not present

## 2018-12-18 DIAGNOSIS — R0603 Acute respiratory distress: Secondary | ICD-10-CM | POA: Diagnosis not present

## 2018-12-18 DIAGNOSIS — R52 Pain, unspecified: Secondary | ICD-10-CM | POA: Diagnosis not present

## 2018-12-18 DIAGNOSIS — I739 Peripheral vascular disease, unspecified: Secondary | ICD-10-CM | POA: Diagnosis not present

## 2018-12-18 DIAGNOSIS — R0902 Hypoxemia: Secondary | ICD-10-CM | POA: Diagnosis not present

## 2018-12-18 DIAGNOSIS — R0789 Other chest pain: Secondary | ICD-10-CM | POA: Diagnosis not present

## 2018-12-18 DIAGNOSIS — Z79899 Other long term (current) drug therapy: Secondary | ICD-10-CM | POA: Insufficient documentation

## 2018-12-18 LAB — CBC
Hematocrit: 25.6 % — ABNORMAL LOW (ref 34.0–46.6)
Hemoglobin: 7.4 g/dL — ABNORMAL LOW (ref 11.1–15.9)
MCH: 23.4 pg — ABNORMAL LOW (ref 26.6–33.0)
MCHC: 28.9 g/dL — ABNORMAL LOW (ref 31.5–35.7)
MCV: 81 fL (ref 79–97)
Platelets: 259 10*3/uL (ref 150–450)
RBC: 3.16 x10E6/uL — ABNORMAL LOW (ref 3.77–5.28)
RDW: 15.4 % (ref 11.7–15.4)
WBC: 7.7 10*3/uL (ref 3.4–10.8)

## 2018-12-18 LAB — COMPREHENSIVE METABOLIC PANEL
ALT: 12 U/L (ref 0–44)
AST: 22 U/L (ref 15–41)
Albumin: 2.3 g/dL — ABNORMAL LOW (ref 3.5–5.0)
Alkaline Phosphatase: 59 U/L (ref 38–126)
Anion gap: 18 — ABNORMAL HIGH (ref 5–15)
BUN: 13 mg/dL (ref 8–23)
CO2: 9 mmol/L — ABNORMAL LOW (ref 22–32)
Calcium: 6.8 mg/dL — ABNORMAL LOW (ref 8.9–10.3)
Chloride: 117 mmol/L — ABNORMAL HIGH (ref 98–111)
Creatinine, Ser: 0.95 mg/dL (ref 0.44–1.00)
GFR calc Af Amer: >60 mL/min (ref >60)
GFR calc non Af Amer: >60 mL/min (ref >60)
Glucose, Bld: 282 mg/dL — ABNORMAL HIGH (ref 70–99)
Potassium: 3.8 mmol/L (ref 3.5–5.1)
Sodium: 144 mmol/L (ref 135–145)
Total Bilirubin: 1.1 mg/dL (ref 0.3–1.2)
Total Protein: 4.6 g/dL — ABNORMAL LOW (ref 6.5–8.1)

## 2018-12-18 LAB — BASIC METABOLIC PANEL
BUN/Creatinine Ratio: 12 (ref 12–28)
BUN: 13 mg/dL (ref 8–27)
CO2: 18 mmol/L — ABNORMAL LOW (ref 20–29)
Calcium: 9.3 mg/dL (ref 8.7–10.3)
Chloride: 100 mmol/L (ref 96–106)
Creatinine, Ser: 1.13 mg/dL — ABNORMAL HIGH (ref 0.57–1.00)
GFR calc Af Amer: 59 mL/min/{1.73_m2} — ABNORMAL LOW (ref >59)
GFR calc non Af Amer: 51 mL/min/{1.73_m2} — ABNORMAL LOW (ref >59)
Glucose: 248 mg/dL — ABNORMAL HIGH (ref 65–99)
Potassium: 4.4 mmol/L (ref 3.5–5.2)
Sodium: 134 mmol/L (ref 134–144)

## 2018-12-18 LAB — I-STAT CHEM 8, ED
BUN: 20 mg/dL (ref 8–23)
Calcium, Ion: 1.12 mmol/L — ABNORMAL LOW (ref 1.15–1.40)
Chloride: 107 mmol/L (ref 98–111)
Creatinine, Ser: 1.1 mg/dL — ABNORMAL HIGH (ref 0.44–1.00)
Glucose, Bld: 468 mg/dL — ABNORMAL HIGH (ref 70–99)
HCT: 28 % — ABNORMAL LOW (ref 36.0–46.0)
Hemoglobin: 9.5 g/dL — ABNORMAL LOW (ref 12.0–15.0)
Potassium: 4.6 mmol/L (ref 3.5–5.1)
Sodium: 136 mmol/L (ref 135–145)
TCO2: 11 mmol/L — ABNORMAL LOW (ref 22–32)

## 2018-12-18 LAB — PROTIME-INR
INR: 1.6 — ABNORMAL HIGH (ref 0.8–1.2)
Prothrombin Time: 18.6 seconds — ABNORMAL HIGH (ref 11.4–15.2)

## 2018-12-18 LAB — TROPONIN I (HIGH SENSITIVITY): Troponin I (High Sensitivity): 747 ng/L (ref <18)

## 2018-12-18 LAB — CBG MONITORING, ED: Glucose-Capillary: 325 mg/dL — ABNORMAL HIGH (ref 70–99)

## 2018-12-18 MED ORDER — EPINEPHRINE 1 MG/10ML IJ SOSY
PREFILLED_SYRINGE | INTRAMUSCULAR | Status: AC | PRN
  Administered 2018-12-18 (×9): 1 mg via INTRAVENOUS

## 2018-12-18 MED ORDER — SODIUM BICARBONATE 8.4 % IV SOLN
INTRAVENOUS | Status: AC | PRN
  Administered 2018-12-18: 50 meq via INTRAVENOUS

## 2018-12-18 MED ORDER — AMIODARONE HCL 150 MG/3ML IV SOLN
INTRAVENOUS | Status: AC | PRN
  Administered 2018-12-18: 300 mg via INTRAVENOUS

## 2018-12-18 MED ORDER — EPINEPHRINE 1 MG/10ML IJ SOSY
PREFILLED_SYRINGE | INTRAMUSCULAR | Status: AC | PRN
  Administered 2018-12-18 (×2): 1 mg via INTRAVENOUS

## 2018-12-18 MED FILL — Medication: Qty: 1 | Status: AC

## 2019-01-03 ENCOUNTER — Other Ambulatory Visit (HOSPITAL_COMMUNITY): Payer: Medicare HMO

## 2019-01-06 ENCOUNTER — Ambulatory Visit (HOSPITAL_COMMUNITY): Admit: 2019-01-06 | Payer: Medicare HMO | Admitting: Cardiovascular Disease

## 2019-01-06 SURGERY — ABDOMINAL AORTOGRAM W/LOWER EXTREMITY
Anesthesia: LOCAL

## 2019-01-07 NOTE — Code Documentation (Signed)
Pulse check, vfib, shocked at 175 joules, CPR continued

## 2019-01-07 NOTE — Code Documentation (Signed)
Patient time of death occurred at 75

## 2019-01-07 NOTE — ED Notes (Signed)
Transferred Dr. Holly Bodily on call at [place] to Dr. Leonides Schanz

## 2019-01-07 NOTE — Code Documentation (Signed)
Pulse check, PEA, CPR continued

## 2019-01-07 NOTE — ED Notes (Signed)
Paged on call doctor for the patients pcp

## 2019-01-07 NOTE — Code Documentation (Signed)
Pulse check, vib, shocked at 150J, CPR continued

## 2019-01-07 NOTE — Progress Notes (Signed)
Chaplain assisted the family while viewing their deceased loved one.  Brion Aliment Chaplain Resident For questions concerning this note please contact me by pager [phone]

## 2019-01-07 NOTE — ED Triage Notes (Signed)
Pt comes via [facility] from home, SOB since yesterday, hx of stent placement, chest pressure, vomited x 1 with EMS, PTA given 324 ASA, one nitro

## 2019-01-07 NOTE — Code Documentation (Signed)
Pulse check, PEA, CPR resumed

## 2019-01-07 NOTE — Code Documentation (Signed)
Pulse check, no pulse, CPR continued.

## 2019-01-07 NOTE — Code Documentation (Signed)
Pt went unresponsive, possible seizure like activity, no pulse noted, CPR initiated

## 2019-01-07 NOTE — Code Documentation (Signed)
Pulse check, PEA, ultrasound performed no cardiac activity, time of death called at 350am.

## 2019-01-07 NOTE — ED Provider Notes (Signed)
TIME SEEN: 4:01 AM  CHIEF COMPLAINT: Chest pain, shortness of breath  HPI: Patient is a 68 year old female with history of CAD status post stent, hypertension, diabetes, hyperlipidemia, tobacco use, obesity, peripheral arterial disease status post lower extremity stent on Plavix and aspirin who presented to the emergency department with [facility] for complaints of chest pain and shortness of breath.  History is obtained by EMS, review of old records and patient's daughter, Rachel [name].  Patient's daughter reports that patient was complaining of right leg pain for a couple of days.  She saw her cardiologist, Dr. Gwenlyn [name], yesterday for concerns for claudication with plan to perform peripheral angiography and endovascular therapy.  Tonight she developed chest pain that started with walking up the stairs and shortness of breath.  Symptoms improved after using her inhaler.  Then developed another episode of chest pain around 2 AM while at rest that did not resolve.  Daughter reports patient was resistant and coming to the emergency department.  Daughter was able to convince her to allow her to call 911.  On arrival, patient complaining of chest pain and shortness of breath at the bridge.  EKG unremarkable with EMS and normal vital signs.  Placed on nonrebreather for comfort but was not hypoxic.  Peripheral IV in the left upper extremity placed by EMS.  ROS: Level 5 caveat secondary to acuity  PAST MEDICAL HISTORY/PAST SURGICAL HISTORY:  Past Medical History:  Diagnosis Date  . Asthma   . Coronary atherosclerosis of native coronary artery    DES x 2 (overlapping) RCA 03/2008 - patent 2011  . Diabetes type 2, controlled ([place]) 2000  . Essential hypertension, benign   . Fatty liver   . GERD (gastroesophageal reflux disease)   . Hyperlipidemia   . Microalbuminuria   . Obesity   . Odynophagia    Severe esophagitis on CT 05/2010, treated empirically with Diflucan  . PAD (peripheral artery disease) (HCC)     history of left greater than right lower 70 claudication  . Tubular adenoma of colon 07/2007   Multiple colonic polyps  . Venous stasis     MEDICATIONS:  Prior to Admission medications   Medication Sig Start Date End Date Taking? Authorizing Provider  albuterol (PROAIR HFA) 108 (90 Base) MCG/ACT inhaler Inhale 2 puffs into the lungs every 4 (four) hours as needed for wheezing or shortness of breath. Please supply 3 months. 09/10/18   Lauraine Rinne, NP  aspirin EC 81 MG tablet Take 81 mg by mouth daily.      [provider]  clopidogrel (PLAVIX) 75 MG tablet TAKE 1 TABLET EVERY DAY 11/12/18   [name], MD  Continuous Blood Gluc Receiver (FREESTYLE LIBRE READER) DEVI 1 kit by Does not apply route 4 (four) times daily. 08/20/18   Shamleffer, Melanie Crazier, MD  Continuous Blood Gluc Sensor (FREESTYLE LIBRE 14 DAY SENSOR) MISC 1 Device by Does not apply route as directed. 08/20/18   Shamleffer, Melanie Crazier, MD  escitalopram (LEXAPRO) 10 MG tablet Take 1 tablet (10 mg total) by mouth daily. 11/14/18   Mikey Kirschner, MD  escitalopram (LEXAPRO) 10 MG tablet TAKE 1 TABLET BY MOUTH EVERY DAY IN THE MORNING 12/11/18   Mikey Kirschner, MD  fluticasone (FLONASE) 50 MCG/ACT nasal spray PLACE 2 SPRAYS INTO BOTH NOSTRILS DAILY. 10/17/18   Collene Gobble, MD  gabapentin (NEURONTIN) 600 MG tablet Take 1 tablet (600 mg total) by mouth at bedtime. 11/14/18   Mikey Kirschner,  MD  Insulin Glargine (LANTUS SOLOSTAR) 100 UNIT/ML Solostar Pen Inject 24 Units into the skin daily. 07/29/18   Shamleffer, Melanie Crazier, MD  levothyroxine (SYNTHROID) 125 MCG tablet Take one tablet by mouth daily 11/27/18   Mikey Kirschner, MD  levothyroxine (SYNTHROID, LEVOTHROID) 100 MCG tablet TAKE 1 TABLET BY MOUTH EVERY DAY BEFORE BREAKFAST Patient taking differently: Take 100 mcg by mouth daily before breakfast.  09/19/17   Cassandria Anger, MD  linaclotide Rolan Lipa) 72 MCG capsule Take 1 capsule (72  mcg total) by mouth daily before breakfast. 10/09/18   Carlis Stable, NP  meclizine (ANTIVERT) 25 MG tablet Take 1 tablet (25 mg total) by mouth 3 (three) times daily as needed for dizziness. 05/23/17   Mikey Kirschner, MD  metFORMIN (GLUCOPHAGE) 1000 MG tablet Take 1 tablet (1,000 mg total) by mouth daily with breakfast. 09/11/18   Shamleffer, Melanie Crazier, MD  NOVOLOG FLEXPEN 100 UNIT/ML FlexPen INJECT 4 UNITS INTO THE SKIN 3 (THREE) TIMES DAILY WITH MEALS. 10/13/18   [provider]  pantoprazole (PROTONIX) 40 MG tablet Take 1 tablet (40 mg total) by mouth 2 (two) times daily before a meal. 05/08/18   Mahala Menghini, PA-C  rosuvastatin (CRESTOR) 40 MG tablet Take 1 tablet (40 mg total) by mouth at bedtime. 11/14/18   Mikey Kirschner, MD  Semaglutide,0.25 or 0.5MG/DOS, (OZEMPIC, 0.25 OR 0.5 MG/DOSE,) 2 MG/1.5ML SOPN Inject 0.5 mg into the skin once a week. 05/29/18   Shamleffer, Melanie Crazier, MD  umeclidinium bromide (INCRUSE ELLIPTA) 62.5 MCG/INH AEPB Inhale 1 puff into the lungs daily. 09/10/18   Lauraine Rinne, NP    ALLERGIES:  Allergies  Allergen Reactions  . Altace [Ramipril] Cough  . Biaxin [Clarithromycin] Other (See Comments)    Fatigue  . Niacin And Related Other (See Comments)    Flush - felt like on fire.  . Nsaids Other (See Comments)    feverish  . Tramadol Other (See Comments)    Lethargic   . Adhesive [Tape] Rash and Other (See Comments)    Paper tape is ok    SOCIAL HISTORY:  Social History   Tobacco Use  . Smoking status: Current Every Day Smoker    Packs/day: 1.00    Years: 50.00    Pack years: 50.00    Types: Cigarettes  . Smokeless tobacco: Never Used  . Tobacco comment: 0.5ppd today ; Has plans to start quitting smoking with pulm assistance.  Substance Use Topics  . Alcohol use: No    FAMILY HISTORY: Family History  Problem Relation Age of Onset  . Bone cancer Father   . Heart disease Maternal Grandmother   . Hypertension Mother   . Diabetes  Mother   . Colon cancer Neg Hx   . Liver disease Neg Hx   . Inflammatory bowel disease Neg Hx     EXAM: BP 106/69   Pulse (!) 121  CONSTITUTIONAL: Alert and oriented and responds appropriately to questions.  Initially patient in respiratory distress and appeared very uncomfortable asking for help. HEAD: Normocephalic EYES: Conjunctivae clear, pupils appear equal, EOMI, pale conjunctive a ENT: normal nose; moist mucous membranes NECK: Supple, no meningismus, no nuchal rigidity, no LAD  CARD: Regular and tachycardic; S1 and S2 appreciated; no murmurs, no clicks, no rubs, no gallops RESP: Patient brought in on nonrebreather.  Oxygen saturation 100%.  Rhonchorous breath sounds diffusely without wheezing or rales.  Patient is tachypneic and in respiratory distress.  Speaking short sentences.  ABD/GI: Normal bowel sounds; non-distended; soft, non-tender, no rebound, no guarding, no peritoneal signs, no hepatosplenomegaly BACK:  The back appears normal  EXT: no edema; normal capillary refill; no cyanosis, no calf tenderness or swelling    SKIN: Normal color for age and race; warm; no rash NEURO: Moves all extremities equally, normal speech PSYCH: Agitated, in distress  MEDICAL DECISION MAKING: I initially saw patient at the bridge and she was in distress.  Complaining of chest pain and shortness of breath.  On nonrebreather.  Taken immediately on EMS stretcher to her room.  Once entering the room, patient became unresponsive and was pulseless and apneic.  Patient appeared pale.  No diaphoresis.  Patient lost pulses at 3:09 AM.  CPR immediately started.  Patient intubated with 7.5 ETT without using RSI meds.  Even before intubation, patient had bright red blood in her posterior oropharynx and on her lips.  After intubation, copious amounts of blood coming out of her endotracheal tube.  Initial EKG obtained prior to patient arresting showed nonspecific ST changes but no STEMI, interval abnormality,  arrhythmia.  Blood glucose 325.  Patient initially in PEA.  During resuscitation, patient stayed mostly in PEA.  She had 2 brief runs of ventricular fibrillation and was defibrillated twice.  She received 2 amps of bicarb, 11 epinephrines, 300 mg of amiodarone, 1L IVF.  She had 2 tibial IO's placed.  At 3:50 AM, decision was made to stop further resuscitative measures given patient had no signs of life, had fixed pupils, bedside ultrasound revealed no cardiac activity.  There was no pericardial effusion seen.  In PEA with rate of 25.  Time of death called at 3:50 am.    ED PROGRESS: 3:54 AM  Spoke with [place], ME on call.  Patient is not a medical examiner case.  I have completed patient's death certificate.  Suspect ASCVD as cause of death.  No sign of trauma on exam.  No concerns for foul play per daughter.  No history of substance abuse.  Updated patient's daughter Rachel [name] by phone.  This is patient's only child.  She would like to come see the patient in the emergency department.  4:33 AM  Updated Dr. Holly Bodily on call for DR. Baltazar Apo, patient's PCP.  I reviewed all nursing notes and pertinent previous records as available.  I have interpreted any EKGs, lab and urine results, imaging (as available).   EKG Interpretation  Date/Time:  2018/12/20 03:07:19 EST Ventricular Rate:  98 PR Interval:    QRS Duration: 92 QT Interval:  325 QTC Calculation: 415 R Axis:   66 Text Interpretation: Sinus rhythm Borderline repolarization abnormality No significant change since last tracing Confirmed by Pryor Curia [phone]) on Dec 20, 2018 4:02:06 AM      Cardiopulmonary Resuscitation (CPR) Procedure Note Directed/Performed by: Pryor Curia I personally directed ancillary staff and/or performed CPR in an effort to regain return of spontaneous circulation and to maintain cardiac, neuro and systemic perfusion.    Procedure Name: Intubation Date/Time: 12-20-2018 3:10 AM Performed by: Ward,  Delice Bison, DO Pre-anesthesia Checklist: Patient identified, Patient being monitored, Emergency Drugs available, Timeout performed and Suction available Oxygen Delivery Method: Ambu bag Preoxygenation: Pre-oxygenation with 100% oxygen Induction Type: Cricoid Pressure applied Ventilation: Mask ventilation without difficulty Laryngoscope Size: Mac and 3 Grade View: Grade II Tube size: 7.5 mm Number of attempts: 1 Placement Confirmation: ETT inserted through vocal cords under direct vision,  CO2 detector and Breath sounds checked- equal and bilateral Secured  at: 25 cm Tube secured with: Tape    IO LINE INSERTION  Date/Time: 01-02-19 3:15 AM Performed by: Ward, Delice Bison, DO Authorized by: Ward, Delice Bison, DO   Consent:    Consent obtained:  Emergent situation Pre-procedure details:    Site preparation:  Alcohol Anesthesia (see MAR for exact dosages):    Anesthesia method:  None Procedure details:    Insertion site:  L proximal tibia   Insertion device:  Drill device   Number of attempts:  1   Insertion confirmation:  Easy infusion of fluids IO LINE INSERTION  Date/Time: 01/02/2019 3:30 AM Performed by: Ward, Delice Bison, DO Authorized by: Ward, Delice Bison, DO   Consent:    Consent obtained:  Emergent situation Pre-procedure details:    Site preparation:  Alcohol Anesthesia (see MAR for exact dosages):    Anesthesia method:  None Procedure details:    Insertion site:  R proximal tibia   Insertion device:  Drill device   Insertion: Needle was inserted through the bony cortex     Number of attempts:  1   CRITICAL CARE Performed by: Pryor Curia   Total critical care time: 36 minutes  Critical care time was exclusive of separately billable procedures and treating other patients.  Critical care was necessary to treat or prevent imminent or life-threatening deterioration.  Critical care was time spent personally by me on the following activities: development of  treatment plan with patient and/or surrogate as well as nursing, discussions with consultants, evaluation of patient's response to treatment, examination of patient, obtaining history from patient or surrogate, ordering and performing treatments and interventions, ordering and review of laboratory studies, ordering and review of radiographic studies, pulse oximetry and re-evaluation of patient's condition.    MILLI WOOLRIDGE was evaluated in Emergency Department on 2019-01-02 for the symptoms described in the history of present illness. She was evaluated in the context of the global COVID-19 pandemic, which necessitated consideration that the patient might be at risk for infection with the SARS-CoV-2 virus that causes COVID-19. Institutional protocols and algorithms that pertain to the evaluation of patients at risk for COVID-19 are in a state of rapid change based on information released by regulatory bodies including the CDC and federal and state organizations. These policies and algorithms were followed during the patient's care in the ED.    Ward, Delice Bison, DO 2019/01/02 [phone]

## 2019-01-07 DEATH — deceased

## 2019-01-08 ENCOUNTER — Ambulatory Visit: Payer: Medicare HMO | Admitting: Nurse Practitioner

## 2019-01-21 ENCOUNTER — Encounter (HOSPITAL_COMMUNITY): Payer: Medicare HMO

## 2019-01-22 ENCOUNTER — Encounter (HOSPITAL_COMMUNITY): Payer: Medicare HMO

## 2019-01-24 ENCOUNTER — Ambulatory Visit: Payer: Medicare HMO | Admitting: Cardiovascular Disease
